# Patient Record
Sex: Female | Born: 1945 | Race: Black or African American | Hispanic: No | Smoking: Former smoker
Health system: Southern US, Community
[De-identification: ages and names within clinical notes are randomized; demographics above are authoritative.]

## PROBLEM LIST (undated history)

## (undated) DIAGNOSIS — C719 Malignant neoplasm of brain, unspecified: Secondary | ICD-10-CM

## (undated) DIAGNOSIS — M545 Low back pain, unspecified: Secondary | ICD-10-CM

## (undated) DIAGNOSIS — G039 Meningitis, unspecified: Secondary | ICD-10-CM

## (undated) DIAGNOSIS — C7951 Secondary malignant neoplasm of bone: Secondary | ICD-10-CM

## (undated) DIAGNOSIS — G8929 Other chronic pain: Secondary | ICD-10-CM

## (undated) DIAGNOSIS — Z923 Personal history of irradiation: Secondary | ICD-10-CM

## (undated) DIAGNOSIS — Z8669 Personal history of other diseases of the nervous system and sense organs: Secondary | ICD-10-CM

## (undated) DIAGNOSIS — Z720 Tobacco use: Secondary | ICD-10-CM

## (undated) DIAGNOSIS — M7989 Other specified soft tissue disorders: Secondary | ICD-10-CM

## (undated) DIAGNOSIS — IMO0002 Reserved for concepts with insufficient information to code with codable children: Secondary | ICD-10-CM

## (undated) DIAGNOSIS — E785 Hyperlipidemia, unspecified: Secondary | ICD-10-CM

## (undated) DIAGNOSIS — C801 Malignant (primary) neoplasm, unspecified: Secondary | ICD-10-CM

## (undated) DIAGNOSIS — R0602 Shortness of breath: Secondary | ICD-10-CM

## (undated) DIAGNOSIS — M792 Neuralgia and neuritis, unspecified: Secondary | ICD-10-CM

## (undated) DIAGNOSIS — I10 Essential (primary) hypertension: Secondary | ICD-10-CM

## (undated) DIAGNOSIS — M659 Unspecified synovitis and tenosynovitis, unspecified site: Secondary | ICD-10-CM

## (undated) DIAGNOSIS — G43909 Migraine, unspecified, not intractable, without status migrainosus: Secondary | ICD-10-CM

## (undated) DIAGNOSIS — C50919 Malignant neoplasm of unspecified site of unspecified female breast: Secondary | ICD-10-CM

## (undated) HISTORY — DX: Reserved for concepts with insufficient information to code with codable children: IMO0002

## (undated) HISTORY — DX: Neuralgia and neuritis, unspecified: M79.2

## (undated) HISTORY — DX: Malignant neoplasm of brain, unspecified: C71.9

## (undated) HISTORY — DX: Unspecified synovitis and tenosynovitis, unspecified site: M65.90

## (undated) HISTORY — PX: LAMINECTOMY: SHX219

## (undated) HISTORY — DX: Personal history of other diseases of the nervous system and sense organs: Z86.69

## (undated) HISTORY — PX: ROTATOR CUFF REPAIR: SHX139

## (undated) HISTORY — DX: Synovitis and tenosynovitis, unspecified: M65.9

## (undated) HISTORY — DX: Tobacco use: Z72.0

## (undated) HISTORY — DX: Low back pain, unspecified: M54.50

## (undated) HISTORY — DX: Other chronic pain: G89.29

## (undated) HISTORY — PX: ABDOMINAL HYSTERECTOMY: SHX81

## (undated) HISTORY — PX: JOINT REPLACEMENT: SHX530

## (undated) HISTORY — PX: BACK SURGERY: SHX140

## (undated) HISTORY — DX: Hyperlipidemia, unspecified: E78.5

## (undated) HISTORY — PX: BREAST SURGERY: SHX581

## (undated) HISTORY — DX: Meningitis, unspecified: G03.9

## (undated) HISTORY — DX: Migraine, unspecified, not intractable, without status migrainosus: G43.909

## (undated) HISTORY — DX: Other specified soft tissue disorders: M79.89

## (undated) HISTORY — DX: Malignant neoplasm of unspecified site of unspecified female breast: C50.919

## (undated) HISTORY — DX: Low back pain: M54.5

## (undated) HISTORY — DX: Essential (primary) hypertension: I10

## (undated) HISTORY — PX: MASTECTOMY: SHX3

## (undated) SURGERY — AMPUTATION DIGIT
Anesthesia: General | Laterality: Bilateral

---

## 1994-06-30 DIAGNOSIS — C801 Malignant (primary) neoplasm, unspecified: Secondary | ICD-10-CM

## 1994-06-30 HISTORY — DX: Malignant (primary) neoplasm, unspecified: C80.1

## 1998-05-14 ENCOUNTER — Encounter: Payer: Self-pay | Admitting: Orthopedic Surgery

## 1998-05-15 ENCOUNTER — Ambulatory Visit (HOSPITAL_COMMUNITY): Admission: RE | Admit: 1998-05-15 | Discharge: 1998-05-15 | Payer: Self-pay | Admitting: Orthopedic Surgery

## 1999-11-25 ENCOUNTER — Encounter: Payer: Self-pay | Admitting: Orthopedic Surgery

## 1999-11-26 ENCOUNTER — Ambulatory Visit (HOSPITAL_COMMUNITY): Admission: RE | Admit: 1999-11-26 | Discharge: 1999-11-26 | Payer: Self-pay | Admitting: Orthopedic Surgery

## 2000-03-10 ENCOUNTER — Emergency Department (HOSPITAL_COMMUNITY): Admission: EM | Admit: 2000-03-10 | Discharge: 2000-03-10 | Payer: Self-pay | Admitting: Emergency Medicine

## 2000-04-28 ENCOUNTER — Ambulatory Visit (HOSPITAL_COMMUNITY): Admission: RE | Admit: 2000-04-28 | Discharge: 2000-04-28 | Payer: Self-pay | Admitting: Orthopedic Surgery

## 2000-06-02 ENCOUNTER — Ambulatory Visit (HOSPITAL_COMMUNITY): Admission: RE | Admit: 2000-06-02 | Discharge: 2000-06-02 | Payer: Self-pay | Admitting: General Surgery

## 2000-06-02 ENCOUNTER — Encounter (HOSPITAL_BASED_OUTPATIENT_CLINIC_OR_DEPARTMENT_OTHER): Payer: Self-pay | Admitting: General Surgery

## 2000-06-08 ENCOUNTER — Ambulatory Visit (HOSPITAL_COMMUNITY): Admission: RE | Admit: 2000-06-08 | Discharge: 2000-06-08 | Payer: Self-pay | Admitting: Hematology and Oncology

## 2000-06-08 ENCOUNTER — Encounter (HOSPITAL_BASED_OUTPATIENT_CLINIC_OR_DEPARTMENT_OTHER): Payer: Self-pay | Admitting: General Surgery

## 2000-06-08 ENCOUNTER — Encounter: Payer: Self-pay | Admitting: Hematology and Oncology

## 2000-10-01 ENCOUNTER — Ambulatory Visit (HOSPITAL_COMMUNITY): Admission: RE | Admit: 2000-10-01 | Discharge: 2000-10-01 | Payer: Self-pay | Admitting: General Surgery

## 2001-10-04 ENCOUNTER — Emergency Department (HOSPITAL_COMMUNITY): Admission: EM | Admit: 2001-10-04 | Discharge: 2001-10-05 | Payer: Self-pay | Admitting: Emergency Medicine

## 2001-10-18 ENCOUNTER — Emergency Department (HOSPITAL_COMMUNITY): Admission: EM | Admit: 2001-10-18 | Discharge: 2001-10-18 | Payer: Self-pay | Admitting: Emergency Medicine

## 2001-10-19 ENCOUNTER — Encounter: Payer: Self-pay | Admitting: Emergency Medicine

## 2001-10-20 ENCOUNTER — Inpatient Hospital Stay (HOSPITAL_COMMUNITY): Admission: EM | Admit: 2001-10-20 | Discharge: 2001-10-24 | Payer: Self-pay | Admitting: *Deleted

## 2001-10-20 ENCOUNTER — Encounter: Payer: Self-pay | Admitting: Internal Medicine

## 2001-10-21 ENCOUNTER — Encounter: Payer: Self-pay | Admitting: Internal Medicine

## 2001-10-25 ENCOUNTER — Encounter: Admission: RE | Admit: 2001-10-25 | Discharge: 2001-10-25 | Payer: Self-pay

## 2001-11-18 ENCOUNTER — Encounter: Admission: RE | Admit: 2001-11-18 | Discharge: 2001-11-18 | Payer: Self-pay | Admitting: Internal Medicine

## 2002-02-03 ENCOUNTER — Encounter: Admission: RE | Admit: 2002-02-03 | Discharge: 2002-02-03 | Payer: Self-pay | Admitting: Internal Medicine

## 2002-02-15 ENCOUNTER — Encounter: Payer: Self-pay | Admitting: Emergency Medicine

## 2002-02-15 ENCOUNTER — Emergency Department (HOSPITAL_COMMUNITY): Admission: EM | Admit: 2002-02-15 | Discharge: 2002-02-15 | Payer: Self-pay | Admitting: Emergency Medicine

## 2002-03-07 ENCOUNTER — Encounter: Admission: RE | Admit: 2002-03-07 | Discharge: 2002-03-07 | Payer: Self-pay | Admitting: Internal Medicine

## 2002-03-20 ENCOUNTER — Emergency Department (HOSPITAL_COMMUNITY): Admission: EM | Admit: 2002-03-20 | Discharge: 2002-03-20 | Payer: Self-pay | Admitting: Emergency Medicine

## 2002-05-12 ENCOUNTER — Emergency Department (HOSPITAL_COMMUNITY): Admission: EM | Admit: 2002-05-12 | Discharge: 2002-05-12 | Payer: Self-pay

## 2002-06-21 ENCOUNTER — Inpatient Hospital Stay (HOSPITAL_COMMUNITY): Admission: EM | Admit: 2002-06-21 | Discharge: 2002-06-25 | Payer: Self-pay | Admitting: Emergency Medicine

## 2002-06-27 ENCOUNTER — Ambulatory Visit: Admission: RE | Admit: 2002-06-27 | Discharge: 2002-07-19 | Payer: Self-pay | Admitting: Radiation Oncology

## 2002-11-08 ENCOUNTER — Ambulatory Visit (HOSPITAL_COMMUNITY): Admission: RE | Admit: 2002-11-08 | Discharge: 2002-11-08 | Payer: Self-pay | Admitting: Internal Medicine

## 2002-11-08 ENCOUNTER — Encounter: Admission: RE | Admit: 2002-11-08 | Discharge: 2002-11-08 | Payer: Self-pay | Admitting: Internal Medicine

## 2002-11-11 ENCOUNTER — Ambulatory Visit (HOSPITAL_COMMUNITY): Admission: RE | Admit: 2002-11-11 | Discharge: 2002-11-11 | Payer: Self-pay | Admitting: Internal Medicine

## 2002-11-11 ENCOUNTER — Encounter: Payer: Self-pay | Admitting: Internal Medicine

## 2002-11-15 ENCOUNTER — Encounter: Admission: RE | Admit: 2002-11-15 | Discharge: 2002-11-15 | Payer: Self-pay | Admitting: Internal Medicine

## 2002-12-14 ENCOUNTER — Encounter: Payer: Self-pay | Admitting: Oncology

## 2002-12-14 ENCOUNTER — Ambulatory Visit (HOSPITAL_COMMUNITY): Admission: RE | Admit: 2002-12-14 | Discharge: 2002-12-14 | Payer: Self-pay | Admitting: Oncology

## 2003-01-12 ENCOUNTER — Encounter: Admission: RE | Admit: 2003-01-12 | Discharge: 2003-01-12 | Payer: Self-pay | Admitting: Internal Medicine

## 2003-02-21 ENCOUNTER — Encounter: Admission: RE | Admit: 2003-02-21 | Discharge: 2003-02-21 | Payer: Self-pay | Admitting: Internal Medicine

## 2003-03-01 ENCOUNTER — Encounter: Admission: RE | Admit: 2003-03-01 | Discharge: 2003-03-01 | Payer: Self-pay | Admitting: Internal Medicine

## 2003-04-04 ENCOUNTER — Encounter: Payer: Self-pay | Admitting: Emergency Medicine

## 2003-04-04 ENCOUNTER — Emergency Department (HOSPITAL_COMMUNITY): Admission: EM | Admit: 2003-04-04 | Discharge: 2003-04-04 | Payer: Self-pay | Admitting: Emergency Medicine

## 2003-05-02 ENCOUNTER — Emergency Department (HOSPITAL_COMMUNITY): Admission: EM | Admit: 2003-05-02 | Discharge: 2003-05-02 | Payer: Self-pay

## 2003-05-16 ENCOUNTER — Ambulatory Visit (HOSPITAL_COMMUNITY): Admission: RE | Admit: 2003-05-16 | Discharge: 2003-05-16 | Payer: Self-pay | Admitting: Internal Medicine

## 2003-05-16 ENCOUNTER — Encounter: Admission: RE | Admit: 2003-05-16 | Discharge: 2003-05-16 | Payer: Self-pay | Admitting: Internal Medicine

## 2003-06-05 ENCOUNTER — Encounter: Admission: RE | Admit: 2003-06-05 | Discharge: 2003-06-05 | Payer: Self-pay | Admitting: Internal Medicine

## 2003-08-25 ENCOUNTER — Ambulatory Visit (HOSPITAL_COMMUNITY): Admission: RE | Admit: 2003-08-25 | Discharge: 2003-08-25 | Payer: Self-pay | Admitting: Oncology

## 2003-10-16 ENCOUNTER — Ambulatory Visit (HOSPITAL_COMMUNITY): Admission: RE | Admit: 2003-10-16 | Discharge: 2003-10-16 | Payer: Self-pay | Admitting: Oncology

## 2004-03-08 ENCOUNTER — Emergency Department (HOSPITAL_COMMUNITY): Admission: EM | Admit: 2004-03-08 | Discharge: 2004-03-08 | Payer: Self-pay | Admitting: Emergency Medicine

## 2004-03-19 ENCOUNTER — Ambulatory Visit: Payer: Self-pay | Admitting: Internal Medicine

## 2004-04-02 ENCOUNTER — Encounter: Admission: RE | Admit: 2004-04-02 | Discharge: 2004-04-02 | Payer: Self-pay | Admitting: Internal Medicine

## 2004-05-02 ENCOUNTER — Ambulatory Visit: Payer: Self-pay | Admitting: Oncology

## 2004-07-25 ENCOUNTER — Ambulatory Visit: Payer: Self-pay | Admitting: Oncology

## 2004-09-18 ENCOUNTER — Ambulatory Visit: Payer: Self-pay | Admitting: Oncology

## 2004-09-27 ENCOUNTER — Ambulatory Visit (HOSPITAL_COMMUNITY): Admission: RE | Admit: 2004-09-27 | Discharge: 2004-09-27 | Payer: Self-pay | Admitting: Orthopedic Surgery

## 2004-11-20 ENCOUNTER — Ambulatory Visit: Payer: Self-pay | Admitting: Oncology

## 2005-01-14 ENCOUNTER — Ambulatory Visit (HOSPITAL_COMMUNITY): Admission: RE | Admit: 2005-01-14 | Discharge: 2005-01-14 | Payer: Self-pay | Admitting: Oncology

## 2005-01-15 ENCOUNTER — Ambulatory Visit: Payer: Self-pay | Admitting: Oncology

## 2005-02-11 ENCOUNTER — Ambulatory Visit: Payer: Self-pay | Admitting: Internal Medicine

## 2005-02-11 ENCOUNTER — Inpatient Hospital Stay (HOSPITAL_COMMUNITY): Admission: EM | Admit: 2005-02-11 | Discharge: 2005-02-14 | Payer: Self-pay | Admitting: Emergency Medicine

## 2005-02-12 ENCOUNTER — Ambulatory Visit: Payer: Self-pay | Admitting: Oncology

## 2005-02-25 ENCOUNTER — Ambulatory Visit: Payer: Self-pay | Admitting: Internal Medicine

## 2005-02-26 ENCOUNTER — Ambulatory Visit (HOSPITAL_COMMUNITY): Admission: RE | Admit: 2005-02-26 | Discharge: 2005-02-27 | Payer: Self-pay | Admitting: Orthopedic Surgery

## 2005-03-13 ENCOUNTER — Ambulatory Visit: Payer: Self-pay | Admitting: Oncology

## 2005-04-04 ENCOUNTER — Encounter: Admission: RE | Admit: 2005-04-04 | Discharge: 2005-04-04 | Payer: Self-pay | Admitting: Oncology

## 2005-04-15 ENCOUNTER — Encounter: Admission: RE | Admit: 2005-04-15 | Discharge: 2005-07-14 | Payer: Self-pay | Admitting: Orthopedic Surgery

## 2005-05-05 ENCOUNTER — Ambulatory Visit: Payer: Self-pay | Admitting: Oncology

## 2005-06-18 ENCOUNTER — Ambulatory Visit: Payer: Self-pay | Admitting: Internal Medicine

## 2005-06-20 ENCOUNTER — Ambulatory Visit: Payer: Self-pay | Admitting: Oncology

## 2005-06-27 ENCOUNTER — Emergency Department (HOSPITAL_COMMUNITY): Admission: EM | Admit: 2005-06-27 | Discharge: 2005-06-27 | Payer: Self-pay | Admitting: Family Medicine

## 2005-08-18 ENCOUNTER — Ambulatory Visit: Payer: Self-pay | Admitting: Oncology

## 2005-08-22 ENCOUNTER — Ambulatory Visit: Admission: RE | Admit: 2005-08-22 | Discharge: 2005-08-22 | Payer: Self-pay | Admitting: Oncology

## 2005-08-26 ENCOUNTER — Ambulatory Visit (HOSPITAL_COMMUNITY): Admission: RE | Admit: 2005-08-26 | Discharge: 2005-08-26 | Payer: Self-pay | Admitting: Oncology

## 2005-08-27 ENCOUNTER — Ambulatory Visit (HOSPITAL_COMMUNITY): Admission: RE | Admit: 2005-08-27 | Discharge: 2005-08-27 | Payer: Self-pay | Admitting: Orthopedic Surgery

## 2005-09-22 ENCOUNTER — Encounter: Admission: RE | Admit: 2005-09-22 | Discharge: 2005-09-22 | Payer: Self-pay | Admitting: Oncology

## 2005-10-03 ENCOUNTER — Ambulatory Visit: Payer: Self-pay | Admitting: Oncology

## 2005-10-20 ENCOUNTER — Ambulatory Visit: Payer: Self-pay | Admitting: Hospitalist

## 2005-10-24 ENCOUNTER — Ambulatory Visit: Payer: Self-pay | Admitting: Internal Medicine

## 2005-12-03 ENCOUNTER — Encounter: Admission: RE | Admit: 2005-12-03 | Discharge: 2006-01-28 | Payer: Self-pay | Admitting: Neurosurgery

## 2005-12-26 ENCOUNTER — Ambulatory Visit: Payer: Self-pay | Admitting: Oncology

## 2006-01-22 ENCOUNTER — Ambulatory Visit (HOSPITAL_COMMUNITY): Admission: RE | Admit: 2006-01-22 | Discharge: 2006-01-22 | Payer: Self-pay | Admitting: Neurosurgery

## 2006-02-02 LAB — CBC WITH DIFFERENTIAL/PLATELET
BASO%: 0.3 % (ref 0.0–2.0)
EOS%: 1.1 % (ref 0.0–7.0)
LYMPH%: 29.2 % (ref 14.0–48.0)
MCH: 31.1 pg (ref 26.0–34.0)
MCHC: 33.4 g/dL (ref 32.0–36.0)
MONO#: 0.5 10*3/uL (ref 0.1–0.9)
NEUT%: 58.2 % (ref 39.6–76.8)
RBC: 3.87 10*6/uL (ref 3.70–5.32)
WBC: 4.5 10*3/uL (ref 3.9–10.0)
lymph#: 1.3 10*3/uL (ref 0.9–3.3)

## 2006-02-02 LAB — COMPREHENSIVE METABOLIC PANEL
ALT: 8 U/L (ref 0–40)
AST: 11 U/L (ref 0–37)
CO2: 27 mEq/L (ref 19–32)
Chloride: 109 mEq/L (ref 96–112)
Creatinine, Ser: 0.84 mg/dL (ref 0.40–1.20)
Sodium: 144 mEq/L (ref 135–145)
Total Bilirubin: 0.5 mg/dL (ref 0.3–1.2)
Total Protein: 6.3 g/dL (ref 6.0–8.3)

## 2006-02-02 LAB — CANCER ANTIGEN 27.29: CA 27.29: 21 U/mL (ref 0–39)

## 2006-02-03 ENCOUNTER — Ambulatory Visit (HOSPITAL_COMMUNITY): Admission: RE | Admit: 2006-02-03 | Discharge: 2006-02-03 | Payer: Self-pay | Admitting: Neurosurgery

## 2006-02-17 ENCOUNTER — Emergency Department (HOSPITAL_COMMUNITY): Admission: EM | Admit: 2006-02-17 | Discharge: 2006-02-17 | Payer: Self-pay | Admitting: Family Medicine

## 2006-02-19 ENCOUNTER — Ambulatory Visit: Payer: Self-pay | Admitting: Hospitalist

## 2006-02-20 ENCOUNTER — Encounter (INDEPENDENT_AMBULATORY_CARE_PROVIDER_SITE_OTHER): Payer: Self-pay | Admitting: Internal Medicine

## 2006-02-21 ENCOUNTER — Ambulatory Visit (HOSPITAL_COMMUNITY): Admission: RE | Admit: 2006-02-21 | Discharge: 2006-02-21 | Payer: Self-pay | Admitting: Hospitalist

## 2006-02-26 ENCOUNTER — Ambulatory Visit: Payer: Self-pay | Admitting: Internal Medicine

## 2006-03-05 ENCOUNTER — Ambulatory Visit: Payer: Self-pay | Admitting: Oncology

## 2006-03-27 ENCOUNTER — Encounter: Payer: Self-pay | Admitting: Vascular Surgery

## 2006-03-27 ENCOUNTER — Ambulatory Visit: Admission: RE | Admit: 2006-03-27 | Discharge: 2006-03-27 | Payer: Self-pay | Admitting: Oncology

## 2006-04-02 ENCOUNTER — Encounter: Admission: RE | Admit: 2006-04-02 | Discharge: 2006-04-02 | Payer: Self-pay | Admitting: Orthopedic Surgery

## 2006-04-06 ENCOUNTER — Encounter: Admission: RE | Admit: 2006-04-06 | Discharge: 2006-04-06 | Payer: Self-pay | Admitting: Oncology

## 2006-04-29 ENCOUNTER — Ambulatory Visit: Admission: RE | Admit: 2006-04-29 | Discharge: 2006-04-29 | Payer: Self-pay | Admitting: Orthopedic Surgery

## 2006-04-29 ENCOUNTER — Ambulatory Visit: Payer: Self-pay | Admitting: Internal Medicine

## 2006-04-30 ENCOUNTER — Encounter (INDEPENDENT_AMBULATORY_CARE_PROVIDER_SITE_OTHER): Payer: Self-pay | Admitting: Internal Medicine

## 2006-04-30 DIAGNOSIS — R609 Edema, unspecified: Secondary | ICD-10-CM

## 2006-04-30 DIAGNOSIS — IMO0002 Reserved for concepts with insufficient information to code with codable children: Secondary | ICD-10-CM | POA: Insufficient documentation

## 2006-04-30 DIAGNOSIS — Z9010 Acquired absence of unspecified breast and nipple: Secondary | ICD-10-CM | POA: Insufficient documentation

## 2006-04-30 DIAGNOSIS — J45909 Unspecified asthma, uncomplicated: Secondary | ICD-10-CM | POA: Insufficient documentation

## 2006-04-30 DIAGNOSIS — G039 Meningitis, unspecified: Secondary | ICD-10-CM | POA: Insufficient documentation

## 2006-04-30 DIAGNOSIS — M659 Unspecified synovitis and tenosynovitis, unspecified site: Secondary | ICD-10-CM | POA: Insufficient documentation

## 2006-05-04 ENCOUNTER — Ambulatory Visit: Payer: Self-pay | Admitting: Hospitalist

## 2006-05-07 ENCOUNTER — Ambulatory Visit: Payer: Self-pay | Admitting: Internal Medicine

## 2006-05-19 ENCOUNTER — Ambulatory Visit: Payer: Self-pay | Admitting: Oncology

## 2006-06-05 LAB — CBC WITH DIFFERENTIAL/PLATELET
BASO%: 0.6 % (ref 0.0–2.0)
Basophils Absolute: 0 10*3/uL (ref 0.0–0.1)
EOS%: 1.1 % (ref 0.0–7.0)
HCT: 36.3 % (ref 34.8–46.6)
HGB: 12.3 g/dL (ref 11.6–15.9)
MCH: 31.1 pg (ref 26.0–34.0)
MONO#: 0.5 10*3/uL (ref 0.1–0.9)
NEUT#: 3 10*3/uL (ref 1.5–6.5)
NEUT%: 58.5 % (ref 39.6–76.8)
RDW: 12.3 % (ref 11.3–14.5)
WBC: 5.1 10*3/uL (ref 3.9–10.0)
lymph#: 1.5 10*3/uL (ref 0.9–3.3)

## 2006-06-05 LAB — COMPREHENSIVE METABOLIC PANEL
ALT: 8 U/L (ref 0–35)
AST: 11 U/L (ref 0–37)
Albumin: 4 g/dL (ref 3.5–5.2)
BUN: 15 mg/dL (ref 6–23)
CO2: 25 mEq/L (ref 19–32)
Calcium: 9.2 mg/dL (ref 8.4–10.5)
Chloride: 110 mEq/L (ref 96–112)
Creatinine, Ser: 0.97 mg/dL (ref 0.40–1.20)
Potassium: 3.9 mEq/L (ref 3.5–5.3)

## 2006-06-05 LAB — CANCER ANTIGEN 27.29: CA 27.29: 23 U/mL (ref 0–39)

## 2006-06-11 ENCOUNTER — Inpatient Hospital Stay (HOSPITAL_COMMUNITY): Admission: RE | Admit: 2006-06-11 | Discharge: 2006-06-15 | Payer: Self-pay | Admitting: Orthopedic Surgery

## 2006-07-06 DIAGNOSIS — G43909 Migraine, unspecified, not intractable, without status migrainosus: Secondary | ICD-10-CM | POA: Insufficient documentation

## 2006-07-28 ENCOUNTER — Ambulatory Visit: Payer: Self-pay | Admitting: Oncology

## 2006-08-11 DIAGNOSIS — M545 Low back pain: Secondary | ICD-10-CM

## 2006-09-09 ENCOUNTER — Ambulatory Visit: Payer: Self-pay | Admitting: Oncology

## 2006-09-10 ENCOUNTER — Encounter: Admission: RE | Admit: 2006-09-10 | Discharge: 2006-09-10 | Payer: Self-pay | Admitting: Orthopedic Surgery

## 2006-09-14 ENCOUNTER — Encounter (INDEPENDENT_AMBULATORY_CARE_PROVIDER_SITE_OTHER): Payer: Self-pay | Admitting: Internal Medicine

## 2006-12-10 ENCOUNTER — Ambulatory Visit: Payer: Self-pay | Admitting: Oncology

## 2007-01-20 ENCOUNTER — Encounter: Admission: RE | Admit: 2007-01-20 | Discharge: 2007-01-20 | Payer: Self-pay | Admitting: Orthopedic Surgery

## 2007-01-25 ENCOUNTER — Ambulatory Visit: Payer: Self-pay | Admitting: Oncology

## 2007-01-29 ENCOUNTER — Ambulatory Visit: Payer: Self-pay | Admitting: Vascular Surgery

## 2007-01-29 ENCOUNTER — Ambulatory Visit: Admission: RE | Admit: 2007-01-29 | Discharge: 2007-01-29 | Payer: Self-pay | Admitting: Oncology

## 2007-02-10 ENCOUNTER — Telehealth: Payer: Self-pay | Admitting: *Deleted

## 2007-04-13 ENCOUNTER — Encounter: Admission: RE | Admit: 2007-04-13 | Discharge: 2007-04-13 | Payer: Self-pay | Admitting: Oncology

## 2007-04-14 ENCOUNTER — Ambulatory Visit: Payer: Self-pay | Admitting: Oncology

## 2007-04-14 ENCOUNTER — Telehealth: Payer: Self-pay | Admitting: *Deleted

## 2007-04-28 ENCOUNTER — Encounter (INDEPENDENT_AMBULATORY_CARE_PROVIDER_SITE_OTHER): Payer: Self-pay | Admitting: Internal Medicine

## 2007-05-11 ENCOUNTER — Encounter (INDEPENDENT_AMBULATORY_CARE_PROVIDER_SITE_OTHER): Payer: Self-pay | Admitting: Internal Medicine

## 2007-05-18 ENCOUNTER — Encounter (INDEPENDENT_AMBULATORY_CARE_PROVIDER_SITE_OTHER): Payer: Self-pay | Admitting: Internal Medicine

## 2007-05-18 LAB — COMPREHENSIVE METABOLIC PANEL
ALT: 10 U/L (ref 0–35)
AST: 18 U/L (ref 0–37)
Alkaline Phosphatase: 92 U/L (ref 39–117)
BUN: 13 mg/dL (ref 6–23)
Calcium: 8.9 mg/dL (ref 8.4–10.5)
Creatinine, Ser: 0.86 mg/dL (ref 0.40–1.20)
Total Bilirubin: 0.6 mg/dL (ref 0.3–1.2)

## 2007-05-18 LAB — CBC WITH DIFFERENTIAL/PLATELET
BASO%: 0.2 % (ref 0.0–2.0)
Basophils Absolute: 0 10*3/uL (ref 0.0–0.1)
EOS%: 0.6 % (ref 0.0–7.0)
HCT: 37.5 % (ref 34.8–46.6)
HGB: 12.9 g/dL (ref 11.6–15.9)
LYMPH%: 22.5 % (ref 14.0–48.0)
MCH: 31.2 pg (ref 26.0–34.0)
MCHC: 34.4 g/dL (ref 32.0–36.0)
MCV: 90.8 fL (ref 81.0–101.0)
MONO%: 8.7 % (ref 0.0–13.0)
NEUT%: 68 % (ref 39.6–76.8)

## 2007-05-20 ENCOUNTER — Ambulatory Visit: Admission: RE | Admit: 2007-05-20 | Discharge: 2007-05-20 | Payer: Self-pay | Admitting: Oncology

## 2007-05-20 ENCOUNTER — Ambulatory Visit: Payer: Self-pay | Admitting: Vascular Surgery

## 2007-05-26 ENCOUNTER — Encounter (INDEPENDENT_AMBULATORY_CARE_PROVIDER_SITE_OTHER): Payer: Self-pay | Admitting: Internal Medicine

## 2007-05-26 ENCOUNTER — Ambulatory Visit: Payer: Self-pay | Admitting: Hospitalist

## 2007-05-26 DIAGNOSIS — Z780 Asymptomatic menopausal state: Secondary | ICD-10-CM | POA: Insufficient documentation

## 2007-05-26 DIAGNOSIS — J069 Acute upper respiratory infection, unspecified: Secondary | ICD-10-CM | POA: Insufficient documentation

## 2007-05-26 LAB — CONVERTED CEMR LAB
ALT: 12 units/L (ref 0–35)
AST: 15 units/L (ref 0–37)
BUN: 17 mg/dL (ref 6–23)
Creatinine, Ser: 0.89 mg/dL (ref 0.40–1.20)
HCT: 39.5 % (ref 36.0–46.0)
Hemoglobin: 13 g/dL (ref 12.0–15.0)
MCHC: 32.9 g/dL (ref 30.0–36.0)
MCV: 92.5 fL (ref 78.0–100.0)
Platelets: 239 10*3/uL (ref 150–400)
RDW: 13.9 % (ref 11.5–15.5)
Total Bilirubin: 0.3 mg/dL (ref 0.3–1.2)

## 2007-06-21 ENCOUNTER — Encounter: Admission: RE | Admit: 2007-06-21 | Discharge: 2007-06-21 | Payer: Self-pay | Admitting: Orthopedic Surgery

## 2007-07-21 ENCOUNTER — Ambulatory Visit: Payer: Self-pay | Admitting: Internal Medicine

## 2007-07-21 DIAGNOSIS — I10 Essential (primary) hypertension: Secondary | ICD-10-CM | POA: Insufficient documentation

## 2007-08-05 ENCOUNTER — Ambulatory Visit: Payer: Self-pay | Admitting: Oncology

## 2007-08-09 ENCOUNTER — Encounter (INDEPENDENT_AMBULATORY_CARE_PROVIDER_SITE_OTHER): Payer: Self-pay | Admitting: Internal Medicine

## 2007-08-11 ENCOUNTER — Ambulatory Visit: Payer: Self-pay | Admitting: Internal Medicine

## 2007-08-11 ENCOUNTER — Encounter (INDEPENDENT_AMBULATORY_CARE_PROVIDER_SITE_OTHER): Payer: Self-pay | Admitting: Internal Medicine

## 2007-08-11 LAB — CONVERTED CEMR LAB
BUN: 14 mg/dL (ref 6–23)
CO2: 24 meq/L (ref 19–32)
Chloride: 108 meq/L (ref 96–112)
Creatinine, Ser: 0.83 mg/dL (ref 0.40–1.20)
Potassium: 4 meq/L (ref 3.5–5.3)

## 2007-08-12 ENCOUNTER — Ambulatory Visit (HOSPITAL_COMMUNITY): Admission: RE | Admit: 2007-08-12 | Discharge: 2007-08-12 | Payer: Self-pay | Admitting: Internal Medicine

## 2007-08-20 ENCOUNTER — Encounter (INDEPENDENT_AMBULATORY_CARE_PROVIDER_SITE_OTHER): Payer: Self-pay | Admitting: *Deleted

## 2007-08-20 ENCOUNTER — Ambulatory Visit: Payer: Self-pay | Admitting: Internal Medicine

## 2007-10-20 ENCOUNTER — Encounter: Admission: RE | Admit: 2007-10-20 | Discharge: 2007-10-20 | Payer: Self-pay | Admitting: Orthopedic Surgery

## 2008-01-06 ENCOUNTER — Encounter (INDEPENDENT_AMBULATORY_CARE_PROVIDER_SITE_OTHER): Payer: Self-pay | Admitting: *Deleted

## 2008-01-06 ENCOUNTER — Ambulatory Visit: Payer: Self-pay | Admitting: Infectious Diseases

## 2008-01-06 ENCOUNTER — Ambulatory Visit (HOSPITAL_COMMUNITY): Admission: RE | Admit: 2008-01-06 | Discharge: 2008-01-06 | Payer: Self-pay | Admitting: Infectious Diseases

## 2008-01-06 DIAGNOSIS — M79609 Pain in unspecified limb: Secondary | ICD-10-CM

## 2008-01-07 ENCOUNTER — Encounter (INDEPENDENT_AMBULATORY_CARE_PROVIDER_SITE_OTHER): Payer: Self-pay | Admitting: *Deleted

## 2008-01-07 ENCOUNTER — Ambulatory Visit: Payer: Self-pay | Admitting: Surgery

## 2008-01-07 ENCOUNTER — Ambulatory Visit (HOSPITAL_COMMUNITY): Admission: RE | Admit: 2008-01-07 | Discharge: 2008-01-07 | Payer: Self-pay | Admitting: *Deleted

## 2008-01-07 DIAGNOSIS — E079 Disorder of thyroid, unspecified: Secondary | ICD-10-CM | POA: Insufficient documentation

## 2008-01-07 LAB — CONVERTED CEMR LAB
Basophils Absolute: 0 10*3/uL (ref 0.0–0.1)
Basophils Relative: 0 % (ref 0–1)
Eosinophils Absolute: 0.1 10*3/uL (ref 0.0–0.7)
Hemoglobin: 12.4 g/dL (ref 12.0–15.0)
MCHC: 33.3 g/dL (ref 30.0–36.0)
MCV: 92.1 fL (ref 78.0–100.0)
Monocytes Absolute: 0.6 10*3/uL (ref 0.1–1.0)
Monocytes Relative: 10 % (ref 3–12)
Neutro Abs: 2.9 10*3/uL (ref 1.7–7.7)
Neutrophils Relative %: 55 % (ref 43–77)
RBC: 4.04 M/uL (ref 3.87–5.11)
RDW: 14.7 % (ref 11.5–15.5)
Vitamin B-12: 299 pg/mL (ref 211–911)

## 2008-01-20 ENCOUNTER — Ambulatory Visit: Payer: Self-pay | Admitting: Internal Medicine

## 2008-01-20 ENCOUNTER — Encounter (INDEPENDENT_AMBULATORY_CARE_PROVIDER_SITE_OTHER): Payer: Self-pay | Admitting: Internal Medicine

## 2008-01-20 LAB — CONVERTED CEMR LAB
ALT: 12 units/L (ref 0–35)
AST: 16 units/L (ref 0–37)
Albumin: 4.3 g/dL (ref 3.5–5.2)
CO2: 24 meq/L (ref 19–32)
Calcium: 9.2 mg/dL (ref 8.4–10.5)
Chloride: 105 meq/L (ref 96–112)
Cholesterol: 161 mg/dL (ref 0–200)
Creatinine, Urine: 125.7 mg/dL
Free T4: 1.09 ng/dL (ref 0.89–1.80)
Microalb Creat Ratio: 10.6 mg/g (ref 0.0–30.0)
Potassium: 4.2 meq/L (ref 3.5–5.3)
Sodium: 140 meq/L (ref 135–145)
T4, Total: 7.2 ug/dL (ref 5.0–12.5)
Total CHOL/HDL Ratio: 2.6
Total Protein: 7.1 g/dL (ref 6.0–8.3)

## 2008-01-25 ENCOUNTER — Ambulatory Visit (HOSPITAL_COMMUNITY): Admission: RE | Admit: 2008-01-25 | Discharge: 2008-01-25 | Payer: Self-pay | Admitting: Internal Medicine

## 2008-01-25 ENCOUNTER — Encounter: Payer: Self-pay | Admitting: Internal Medicine

## 2008-01-27 ENCOUNTER — Telehealth: Payer: Self-pay | Admitting: *Deleted

## 2008-03-21 ENCOUNTER — Encounter (INDEPENDENT_AMBULATORY_CARE_PROVIDER_SITE_OTHER): Payer: Self-pay | Admitting: Internal Medicine

## 2008-05-10 ENCOUNTER — Encounter: Admission: RE | Admit: 2008-05-10 | Discharge: 2008-05-10 | Payer: Self-pay | Admitting: Orthopedic Surgery

## 2008-06-14 ENCOUNTER — Ambulatory Visit (HOSPITAL_COMMUNITY): Admission: RE | Admit: 2008-06-14 | Discharge: 2008-06-14 | Payer: Self-pay | Admitting: Orthopedic Surgery

## 2008-09-13 ENCOUNTER — Ambulatory Visit: Payer: Self-pay | Admitting: Oncology

## 2008-09-13 LAB — CBC WITH DIFFERENTIAL/PLATELET
Basophils Absolute: 0 10*3/uL (ref 0.0–0.1)
Eosinophils Absolute: 0 10*3/uL (ref 0.0–0.5)
HCT: 37.4 % (ref 34.8–46.6)
LYMPH%: 35 % (ref 14.0–49.7)
MCV: 90.6 fL (ref 79.5–101.0)
MONO#: 0.5 10*3/uL (ref 0.1–0.9)
MONO%: 9.6 % (ref 0.0–14.0)
NEUT#: 2.9 10*3/uL (ref 1.5–6.5)
NEUT%: 54.3 % (ref 38.4–76.8)
Platelets: 207 10*3/uL (ref 145–400)
RBC: 4.13 10*6/uL (ref 3.70–5.45)
nRBC: 0 % (ref 0–0)

## 2008-09-13 LAB — COMPREHENSIVE METABOLIC PANEL
BUN: 14 mg/dL (ref 6–23)
CO2: 25 mEq/L (ref 19–32)
Calcium: 8.8 mg/dL (ref 8.4–10.5)
Chloride: 104 mEq/L (ref 96–112)
Creatinine, Ser: 0.87 mg/dL (ref 0.40–1.20)
Glucose, Bld: 89 mg/dL (ref 70–99)
Total Bilirubin: 0.4 mg/dL (ref 0.3–1.2)

## 2008-09-13 LAB — CANCER ANTIGEN 27.29: CA 27.29: 58 U/mL — ABNORMAL HIGH (ref 0–39)

## 2008-09-18 ENCOUNTER — Ambulatory Visit (HOSPITAL_COMMUNITY): Admission: RE | Admit: 2008-09-18 | Discharge: 2008-09-18 | Payer: Self-pay | Admitting: Oncology

## 2008-09-20 ENCOUNTER — Encounter (INDEPENDENT_AMBULATORY_CARE_PROVIDER_SITE_OTHER): Payer: Self-pay | Admitting: Internal Medicine

## 2008-10-18 ENCOUNTER — Encounter: Payer: Self-pay | Admitting: Infectious Diseases

## 2008-10-19 ENCOUNTER — Inpatient Hospital Stay (HOSPITAL_COMMUNITY): Admission: EM | Admit: 2008-10-19 | Discharge: 2008-10-20 | Payer: Self-pay | Admitting: Emergency Medicine

## 2008-10-19 ENCOUNTER — Ambulatory Visit: Payer: Self-pay | Admitting: Infectious Diseases

## 2008-10-19 ENCOUNTER — Encounter: Payer: Self-pay | Admitting: Internal Medicine

## 2008-10-19 DIAGNOSIS — N390 Urinary tract infection, site not specified: Secondary | ICD-10-CM

## 2008-10-20 ENCOUNTER — Encounter (INDEPENDENT_AMBULATORY_CARE_PROVIDER_SITE_OTHER): Payer: Self-pay | Admitting: *Deleted

## 2008-10-23 ENCOUNTER — Telehealth (INDEPENDENT_AMBULATORY_CARE_PROVIDER_SITE_OTHER): Payer: Self-pay | Admitting: Internal Medicine

## 2008-10-23 ENCOUNTER — Encounter: Payer: Self-pay | Admitting: Internal Medicine

## 2008-10-23 LAB — CANCER ANTIGEN 27.29: CA 27.29: 60 U/mL — ABNORMAL HIGH (ref 0–39)

## 2008-10-25 ENCOUNTER — Encounter: Payer: Self-pay | Admitting: Internal Medicine

## 2008-10-26 ENCOUNTER — Ambulatory Visit (HOSPITAL_COMMUNITY): Admission: RE | Admit: 2008-10-26 | Discharge: 2008-10-26 | Payer: Self-pay | Admitting: Oncology

## 2008-10-30 ENCOUNTER — Ambulatory Visit: Payer: Self-pay | Admitting: Oncology

## 2008-10-31 ENCOUNTER — Encounter (INDEPENDENT_AMBULATORY_CARE_PROVIDER_SITE_OTHER): Payer: Self-pay | Admitting: Internal Medicine

## 2008-10-31 ENCOUNTER — Ambulatory Visit: Payer: Self-pay | Admitting: Internal Medicine

## 2008-12-27 ENCOUNTER — Ambulatory Visit (HOSPITAL_COMMUNITY): Admission: RE | Admit: 2008-12-27 | Discharge: 2008-12-27 | Payer: Self-pay | Admitting: Orthopedic Surgery

## 2009-01-10 ENCOUNTER — Encounter: Admission: RE | Admit: 2009-01-10 | Discharge: 2009-01-10 | Payer: Self-pay | Admitting: Orthopedic Surgery

## 2009-04-05 ENCOUNTER — Ambulatory Visit: Payer: Self-pay | Admitting: Infectious Diseases

## 2009-04-05 ENCOUNTER — Ambulatory Visit: Payer: Self-pay | Admitting: Internal Medicine

## 2009-04-05 ENCOUNTER — Encounter (INDEPENDENT_AMBULATORY_CARE_PROVIDER_SITE_OTHER): Payer: Self-pay | Admitting: Internal Medicine

## 2009-04-05 ENCOUNTER — Inpatient Hospital Stay (HOSPITAL_COMMUNITY): Admission: AD | Admit: 2009-04-05 | Discharge: 2009-04-06 | Payer: Self-pay | Admitting: Infectious Diseases

## 2009-04-06 ENCOUNTER — Encounter (INDEPENDENT_AMBULATORY_CARE_PROVIDER_SITE_OTHER): Payer: Self-pay | Admitting: Internal Medicine

## 2009-04-09 ENCOUNTER — Ambulatory Visit: Payer: Self-pay | Admitting: Oncology

## 2009-04-11 ENCOUNTER — Encounter: Payer: Self-pay | Admitting: Internal Medicine

## 2009-04-11 LAB — CANCER ANTIGEN 27.29: CA 27.29: 87 U/mL — ABNORMAL HIGH (ref 0–39)

## 2009-05-03 ENCOUNTER — Encounter: Payer: Self-pay | Admitting: Internal Medicine

## 2009-05-29 ENCOUNTER — Ambulatory Visit: Payer: Self-pay | Admitting: Oncology

## 2009-05-30 ENCOUNTER — Ambulatory Visit: Payer: Self-pay | Admitting: Internal Medicine

## 2009-05-30 ENCOUNTER — Inpatient Hospital Stay (HOSPITAL_COMMUNITY): Admission: EM | Admit: 2009-05-30 | Discharge: 2009-05-31 | Payer: Self-pay | Admitting: Emergency Medicine

## 2009-05-31 ENCOUNTER — Encounter (INDEPENDENT_AMBULATORY_CARE_PROVIDER_SITE_OTHER): Payer: Self-pay | Admitting: Dermatology

## 2009-06-04 ENCOUNTER — Telehealth: Payer: Self-pay | Admitting: Internal Medicine

## 2009-06-05 ENCOUNTER — Telehealth: Payer: Self-pay | Admitting: Internal Medicine

## 2009-06-06 ENCOUNTER — Encounter: Payer: Self-pay | Admitting: Internal Medicine

## 2009-06-07 ENCOUNTER — Ambulatory Visit: Payer: Self-pay | Admitting: Internal Medicine

## 2009-06-07 DIAGNOSIS — R1013 Epigastric pain: Secondary | ICD-10-CM

## 2009-06-28 ENCOUNTER — Ambulatory Visit: Payer: Self-pay | Admitting: Oncology

## 2009-07-09 ENCOUNTER — Encounter: Payer: Self-pay | Admitting: Internal Medicine

## 2009-08-20 ENCOUNTER — Ambulatory Visit (HOSPITAL_COMMUNITY): Admission: RE | Admit: 2009-08-20 | Discharge: 2009-08-20 | Payer: Self-pay | Admitting: Orthopedic Surgery

## 2009-08-29 ENCOUNTER — Ambulatory Visit: Payer: Self-pay | Admitting: Oncology

## 2009-08-29 ENCOUNTER — Ambulatory Visit (HOSPITAL_COMMUNITY): Admission: RE | Admit: 2009-08-29 | Discharge: 2009-08-29 | Payer: Self-pay | Admitting: Orthopedic Surgery

## 2009-09-25 ENCOUNTER — Telehealth (INDEPENDENT_AMBULATORY_CARE_PROVIDER_SITE_OTHER): Payer: Self-pay | Admitting: *Deleted

## 2009-09-27 ENCOUNTER — Encounter: Payer: Self-pay | Admitting: Internal Medicine

## 2009-09-27 ENCOUNTER — Ambulatory Visit: Payer: Self-pay | Admitting: Internal Medicine

## 2009-09-27 LAB — CONVERTED CEMR LAB
ALT: 8 units/L (ref 0–35)
AST: 12 units/L (ref 0–37)
CO2: 23 meq/L (ref 19–32)
Calcium: 8.9 mg/dL (ref 8.4–10.5)
Chloride: 106 meq/L (ref 96–112)
Creatinine, Ser: 0.84 mg/dL (ref 0.40–1.20)
Potassium: 3.9 meq/L (ref 3.5–5.3)
Sodium: 142 meq/L (ref 135–145)
Total Protein: 6.5 g/dL (ref 6.0–8.3)

## 2009-09-28 ENCOUNTER — Ambulatory Visit: Payer: Self-pay | Admitting: Oncology

## 2009-10-02 ENCOUNTER — Encounter: Payer: Self-pay | Admitting: Internal Medicine

## 2009-10-02 LAB — COMPREHENSIVE METABOLIC PANEL
AST: 14 U/L (ref 0–37)
Albumin: 4.1 g/dL (ref 3.5–5.2)
BUN: 12 mg/dL (ref 6–23)
Calcium: 9.1 mg/dL (ref 8.4–10.5)
Chloride: 106 mEq/L (ref 96–112)
Potassium: 3.7 mEq/L (ref 3.5–5.3)
Total Protein: 6.7 g/dL (ref 6.0–8.3)

## 2009-10-09 ENCOUNTER — Emergency Department (HOSPITAL_COMMUNITY): Admission: EM | Admit: 2009-10-09 | Discharge: 2009-10-09 | Payer: Self-pay | Admitting: Emergency Medicine

## 2009-10-10 ENCOUNTER — Ambulatory Visit: Payer: Self-pay | Admitting: Internal Medicine

## 2009-10-10 DIAGNOSIS — E785 Hyperlipidemia, unspecified: Secondary | ICD-10-CM | POA: Insufficient documentation

## 2009-10-10 LAB — CONVERTED CEMR LAB
Bilirubin Urine: NEGATIVE
Cholesterol: 161 mg/dL (ref 0–200)
Hemoglobin, Urine: NEGATIVE
Ketones, ur: NEGATIVE mg/dL
Specific Gravity, Urine: 1.019 (ref 1.005–1.0)
Urobilinogen, UA: 0.2 (ref 0.0–1.0)

## 2009-10-16 ENCOUNTER — Telehealth: Payer: Self-pay | Admitting: Internal Medicine

## 2009-10-24 ENCOUNTER — Encounter: Admission: RE | Admit: 2009-10-24 | Discharge: 2009-12-17 | Payer: Self-pay | Admitting: Internal Medicine

## 2009-10-30 ENCOUNTER — Encounter: Admission: RE | Admit: 2009-10-30 | Discharge: 2009-10-30 | Payer: Self-pay | Admitting: Orthopedic Surgery

## 2009-10-31 ENCOUNTER — Ambulatory Visit: Payer: Self-pay | Admitting: Oncology

## 2009-11-01 ENCOUNTER — Encounter: Payer: Self-pay | Admitting: Internal Medicine

## 2009-11-01 LAB — CBC WITH DIFFERENTIAL/PLATELET
Basophils Absolute: 0 10*3/uL (ref 0.0–0.1)
EOS%: 0.8 % (ref 0.0–7.0)
HCT: 38.3 % (ref 34.8–46.6)
HGB: 12.7 g/dL (ref 11.6–15.9)
MCH: 31 pg (ref 25.1–34.0)
MCV: 93.3 fL (ref 79.5–101.0)
MONO%: 9.7 % (ref 0.0–14.0)
NEUT%: 69 % (ref 38.4–76.8)
Platelets: 221 10*3/uL (ref 145–400)

## 2009-11-01 LAB — COMPREHENSIVE METABOLIC PANEL
AST: 12 U/L (ref 0–37)
Alkaline Phosphatase: 91 U/L (ref 39–117)
BUN: 21 mg/dL (ref 6–23)
Calcium: 9.3 mg/dL (ref 8.4–10.5)
Chloride: 108 mEq/L (ref 96–112)
Creatinine, Ser: 0.91 mg/dL (ref 0.40–1.20)

## 2009-11-13 ENCOUNTER — Telehealth: Payer: Self-pay | Admitting: Internal Medicine

## 2009-11-14 ENCOUNTER — Ambulatory Visit: Payer: Self-pay | Admitting: Infectious Disease

## 2009-11-14 ENCOUNTER — Encounter: Payer: Self-pay | Admitting: Infectious Disease

## 2009-11-14 ENCOUNTER — Ambulatory Visit: Payer: Self-pay | Admitting: Vascular Surgery

## 2009-11-14 ENCOUNTER — Ambulatory Visit: Admission: RE | Admit: 2009-11-14 | Discharge: 2009-11-14 | Payer: Self-pay | Admitting: Infectious Disease

## 2009-11-14 LAB — CONVERTED CEMR LAB
BUN: 13 mg/dL (ref 6–23)
Basophils Relative: 0 % (ref 0–1)
CO2: 27 meq/L (ref 19–32)
Chloride: 109 meq/L (ref 96–112)
Eosinophils Absolute: 0.1 10*3/uL (ref 0.0–0.7)
Glucose, Bld: 99 mg/dL (ref 70–99)
MCHC: 33.9 g/dL (ref 30.0–36.0)
MCV: 93.7 fL (ref >=78.0)
Monocytes Relative: 8 % (ref 3–12)
Neutrophils Relative %: 62 % (ref 43–77)
Platelets: 209 10*3/uL (ref 150–400)
Potassium: 4.4 meq/L (ref 3.5–5.3)
RDW: 13.9 % (ref 11.5–15.5)

## 2009-12-05 ENCOUNTER — Ambulatory Visit: Payer: Self-pay | Admitting: Internal Medicine

## 2009-12-06 ENCOUNTER — Encounter: Admission: RE | Admit: 2009-12-06 | Discharge: 2009-12-06 | Payer: Self-pay | Admitting: Orthopedic Surgery

## 2009-12-10 ENCOUNTER — Encounter: Admission: RE | Admit: 2009-12-10 | Discharge: 2009-12-10 | Payer: Self-pay | Admitting: Internal Medicine

## 2009-12-10 LAB — HM MAMMOGRAPHY

## 2009-12-19 ENCOUNTER — Encounter: Admission: RE | Admit: 2009-12-19 | Discharge: 2009-12-19 | Payer: Self-pay | Admitting: Orthopedic Surgery

## 2009-12-27 ENCOUNTER — Ambulatory Visit: Payer: Self-pay | Admitting: Oncology

## 2010-01-29 ENCOUNTER — Encounter: Payer: Self-pay | Admitting: Internal Medicine

## 2010-01-31 ENCOUNTER — Ambulatory Visit: Payer: Self-pay | Admitting: Oncology

## 2010-02-23 IMAGING — CT CT PELVIS W/ CM
3 of 8 series · 14 of 46 positions shown, 18 images · IV contrast (omnipaque)
Comparison: CT chest abdomen pelvis 09/18/2008.

CT ABDOMEN

CLINICAL DATA: Question retroperitoneal hemorrhage, evaluate
metastatic disease, low back pain

CT ABDOMEN AND PELVIS WITH CONTRAST
TECHNIQUE: Multidetector CT imaging of the abdomen and pelvis was
performed using the standard protocol following bolus
administration of intravenous contrast.
Contrast: 100 ml Omnipaque 300

[Series 2: rtn a/p with · axial · 0.81mm/px · z∈[-422,-7]mm · 11 of 95 slices shown, 15 images]
[im 6/95  soft-tissue]
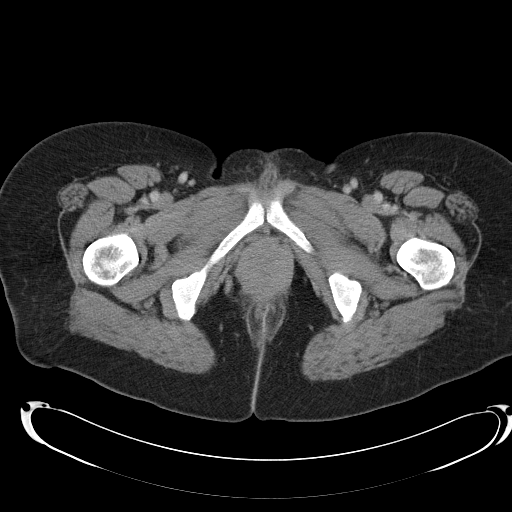
[im 6/95  bone]
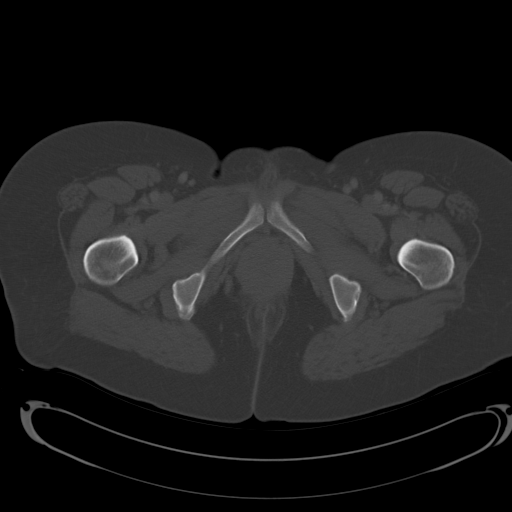
[im 16/95  soft-tissue]
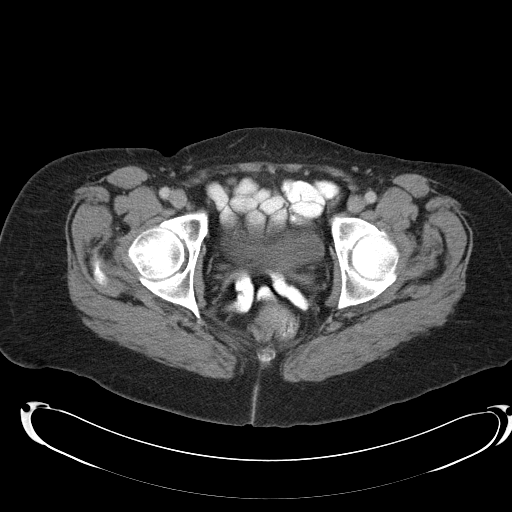
[im 27/95  soft-tissue]
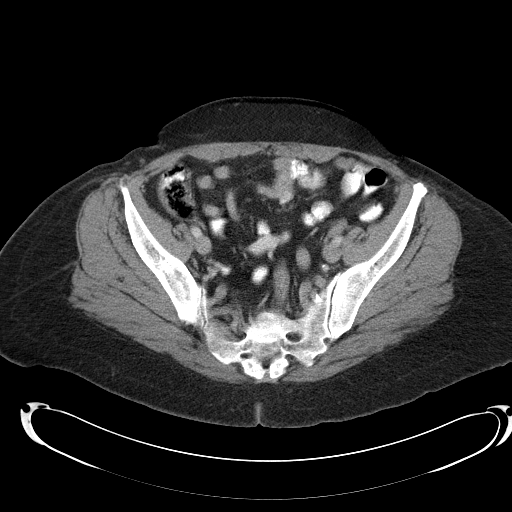
[im 37/95  soft-tissue]
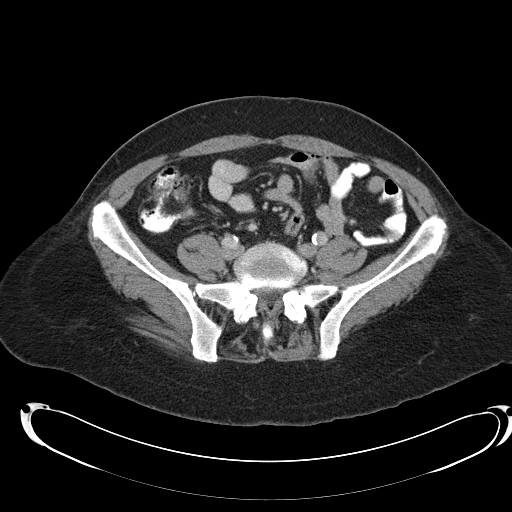
[im 48/95  soft-tissue]
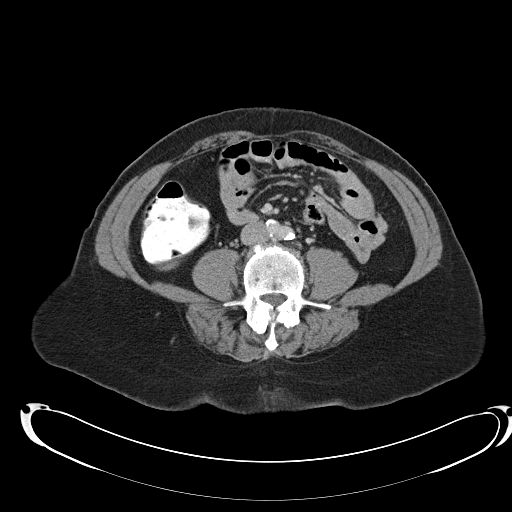
[im 58/95  soft-tissue]
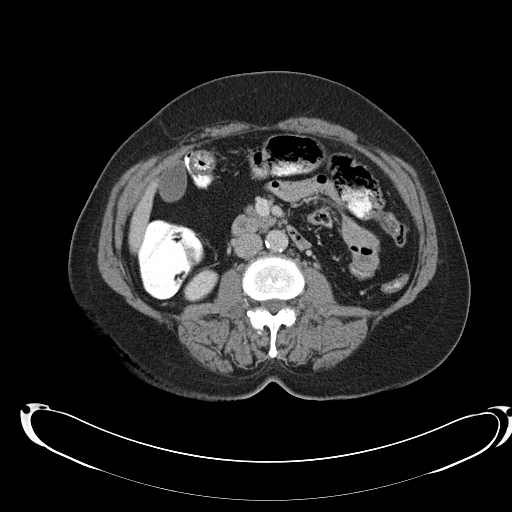
[im 68/95  soft-tissue]
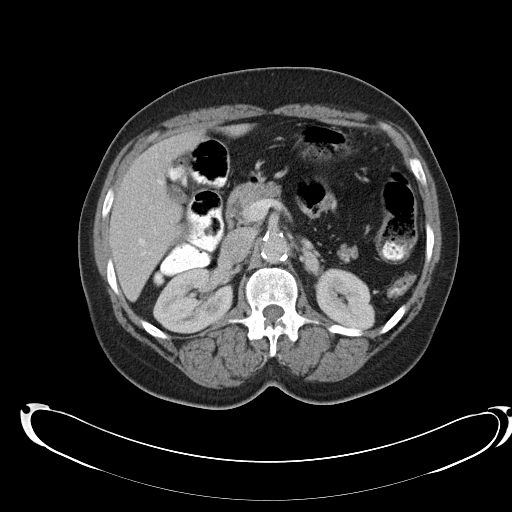
[im 74/95  lung]
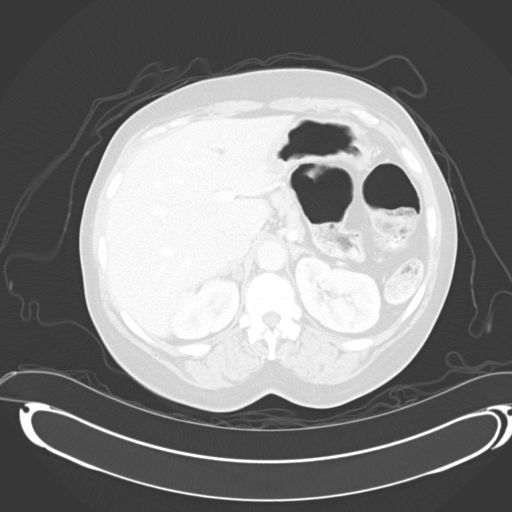
[im 79/95  soft-tissue]
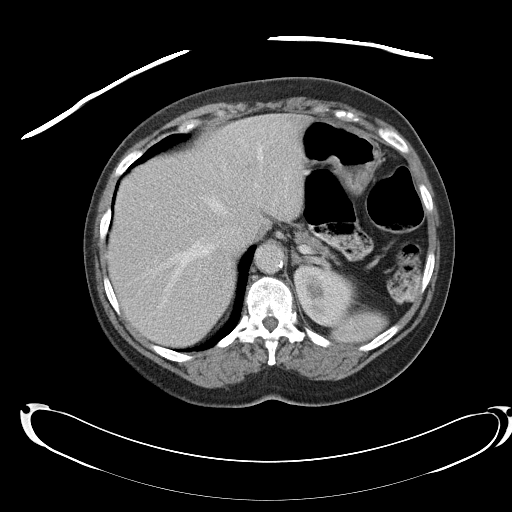
[im 79/95  lung]
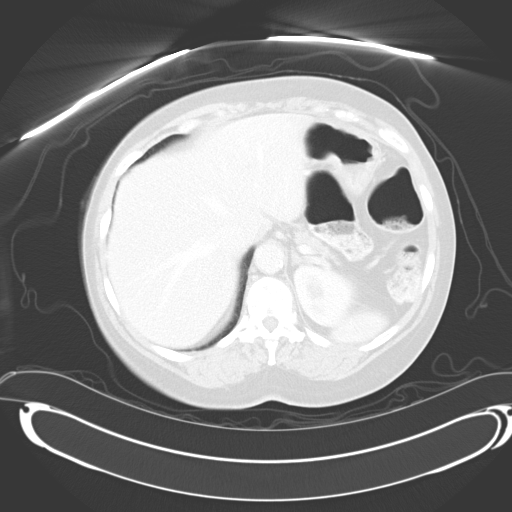
[im 84/95  lung]
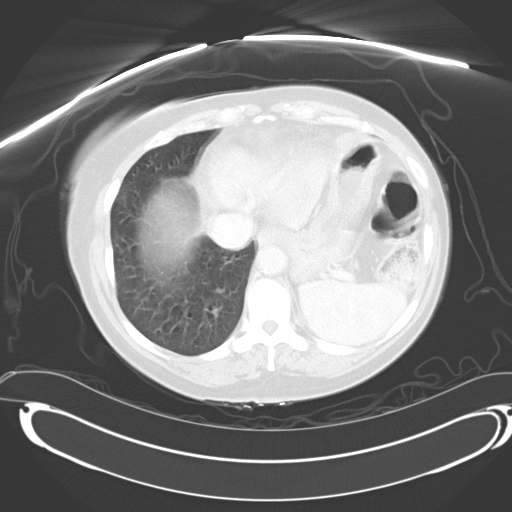
[im 89/95  soft-tissue]
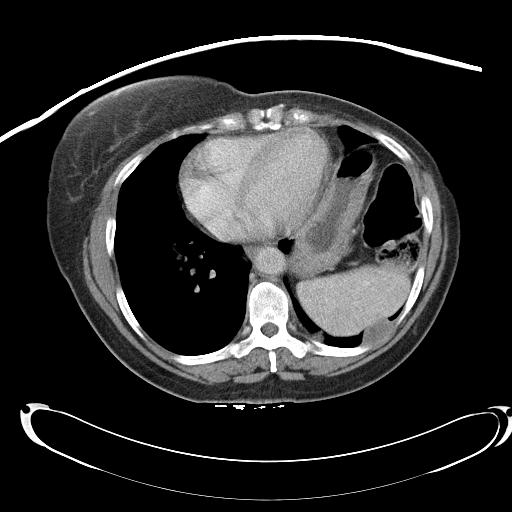
[im 89/95  lung]
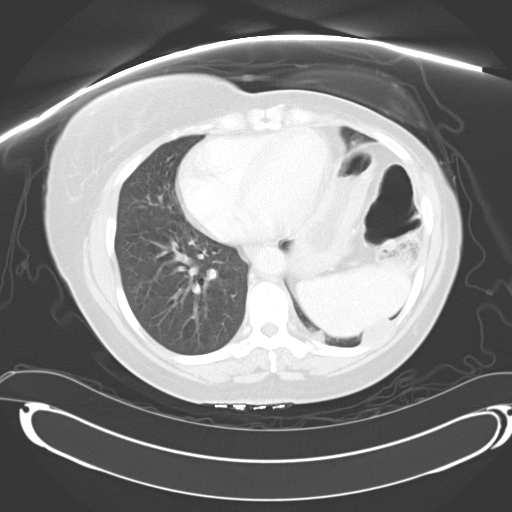
[im 89/95  bone]
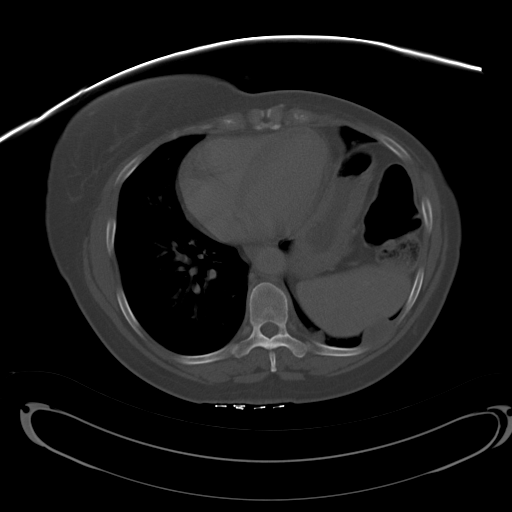

[Series 603: <mpr thick range(1)> · coronal · 0.93mm/px · 2 of 109 slices shown]
[im 37/109  soft-tissue]
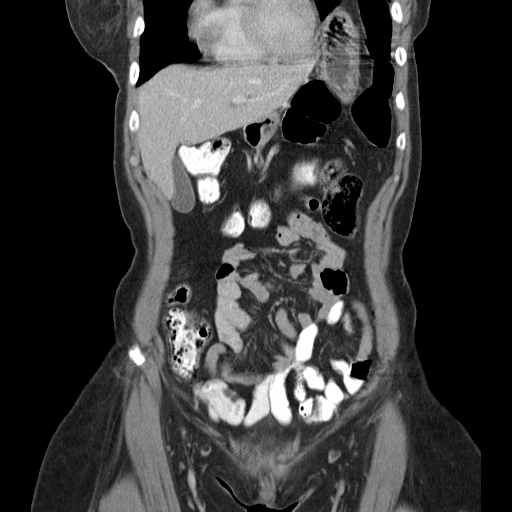
[im 73/109  soft-tissue]
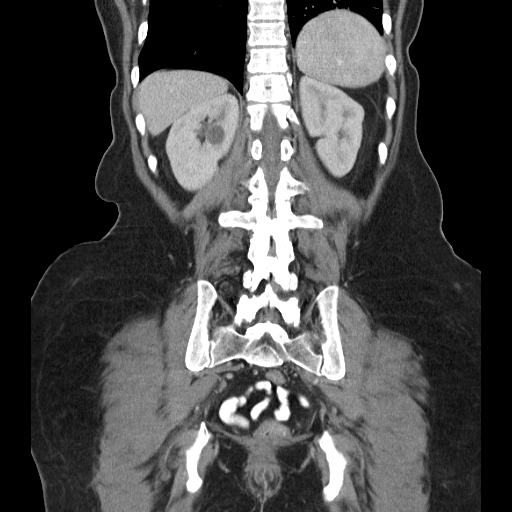

[Series 605: <mpr thick range(3)> · sagittal · 0.93mm/px · 1 of 107 slices shown]
[im 36/107  soft-tissue]
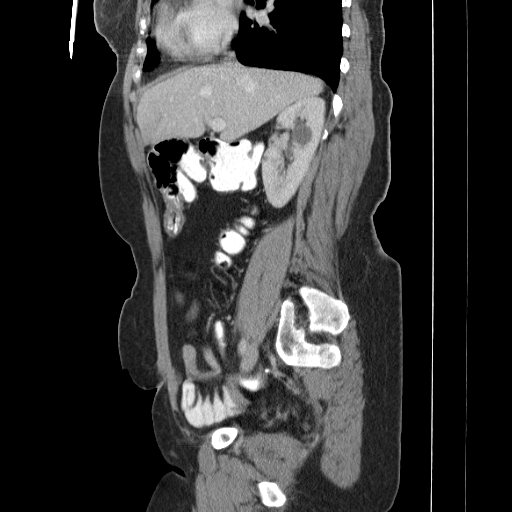

[14 of 46 positions shown; findings below may reference images not displayed]

FINDINGS: Pleural based nodular thickening at the left lung bases
unchanged from prior and measures 3 cm by 1.6 cm.  No new nodules
are evident.

Small lymph nodes in the precordial space anterior to the left
ventricle lobe (image #3) also unchanged.

Within the inferior right hepatic lobe there are two small
hypodensities which were are not clearly evident on comparison.
(Image #33).  Subcapsular hypodensity in the left hepatic lobe and
adjacent to the falciform ligament is unchanged (image #26) the
gallbladder, pancreas, and spleen appear normal.  Small ssplenule
anterior to the spleen.  There is mild enlargement of the left
adrenal gland unchanged from prior.  There is also thickening of
the medial limb of the right adrenal gland.

Within the mid right kidney, there is a 17 mm round hypodense
lesion which cannot be fully characterized as simple cyst.  Small
bilateral cortical hypodensities  are too small to characterize.

The stomach appears normal.  The left hemidiaphragm is elevated
unchanged from prior.  The small bowel and cecum appear normal.
The colon appears normal.

Abdominal aorta is normal caliber.  No evidence of retroperitoneal
lymphadenopathy.  No evidence of retroperitoneal hemorrhage.
IMPRESSION: 1.  There is no significant interval change from prior.  No
evidence of retroperitoneal hemorrhage.
2  Suspicion for liver metastasis.  Two lesions within the inferior
right hepatic lobe are more conspicuous than prior.
3.  No change in pleural based nodular densities at the left lung
base.

4.  Very low density lesion in the right kidney cannot be fully
characterized.

CT PELVIS
FINDINGS: No free fluid in the pelvis.  No evidence of
retroperitoneal hematoma.  The bladder is normal.  Post
hysterectomy anatomy.  The sigmoid colon is redundant extending
into the upper abdomen.  No evidence of obstruction.

There is a 8 mm short axis left external iliac node (image 69).  No
additional evidence of pelvic lymphadenopathy.  Review of  bone
windows demonstrates no aggressive osseous lesions.
IMPRESSION: 1.  There is no evidence of pelvic metastasis. No evidence of
retroperitoneal hemorrhage.
2.  Small left external iliac lymph node.  Recommend attention on
follow-up.

## 2010-02-28 ENCOUNTER — Encounter: Payer: Self-pay | Admitting: Internal Medicine

## 2010-03-15 ENCOUNTER — Encounter: Admission: RE | Admit: 2010-03-15 | Discharge: 2010-03-15 | Payer: Self-pay | Admitting: Orthopedic Surgery

## 2010-03-25 ENCOUNTER — Ambulatory Visit (HOSPITAL_COMMUNITY): Admission: RE | Admit: 2010-03-25 | Discharge: 2010-03-25 | Payer: Self-pay | Admitting: Orthopedic Surgery

## 2010-03-28 ENCOUNTER — Ambulatory Visit: Payer: Self-pay | Admitting: Oncology

## 2010-03-29 ENCOUNTER — Ambulatory Visit (HOSPITAL_COMMUNITY): Admission: RE | Admit: 2010-03-29 | Discharge: 2010-03-29 | Payer: Self-pay | Admitting: Oncology

## 2010-04-01 ENCOUNTER — Encounter: Payer: Self-pay | Admitting: Internal Medicine

## 2010-04-01 LAB — CBC WITH DIFFERENTIAL/PLATELET
Eosinophils Absolute: 0 10*3/uL (ref 0.0–0.5)
HCT: 38.2 % (ref 34.8–46.6)
LYMPH%: 27.6 % (ref 14.0–49.7)
MCHC: 33.3 g/dL (ref 31.5–36.0)
MONO#: 0.5 10*3/uL (ref 0.1–0.9)
NEUT#: 3.2 10*3/uL (ref 1.5–6.5)
NEUT%: 61.5 % (ref 38.4–76.8)
Platelets: 227 10*3/uL (ref 145–400)
WBC: 5.2 10*3/uL (ref 3.9–10.3)

## 2010-04-11 ENCOUNTER — Inpatient Hospital Stay (HOSPITAL_COMMUNITY): Admission: RE | Admit: 2010-04-11 | Discharge: 2010-04-12 | Payer: Self-pay | Admitting: Orthopedic Surgery

## 2010-05-02 ENCOUNTER — Ambulatory Visit: Payer: Self-pay | Admitting: Oncology

## 2010-05-02 ENCOUNTER — Encounter: Payer: Self-pay | Admitting: Internal Medicine

## 2010-05-02 ENCOUNTER — Telehealth: Payer: Self-pay | Admitting: Internal Medicine

## 2010-05-06 ENCOUNTER — Telehealth: Payer: Self-pay | Admitting: *Deleted

## 2010-05-07 ENCOUNTER — Emergency Department (HOSPITAL_COMMUNITY): Admission: EM | Admit: 2010-05-07 | Discharge: 2010-05-07 | Payer: Self-pay | Admitting: Family Medicine

## 2010-05-15 ENCOUNTER — Encounter
Admission: RE | Admit: 2010-05-15 | Discharge: 2010-06-27 | Payer: Self-pay | Source: Home / Self Care | Attending: Orthopedic Surgery | Admitting: Orthopedic Surgery

## 2010-06-04 ENCOUNTER — Ambulatory Visit: Payer: Self-pay | Admitting: Oncology

## 2010-06-11 ENCOUNTER — Encounter
Admission: RE | Admit: 2010-06-11 | Discharge: 2010-06-11 | Payer: Self-pay | Source: Home / Self Care | Attending: Orthopedic Surgery | Admitting: Orthopedic Surgery

## 2010-06-19 ENCOUNTER — Encounter: Payer: Self-pay | Admitting: Internal Medicine

## 2010-06-19 LAB — COMPREHENSIVE METABOLIC PANEL
AST: 12 U/L (ref 0–37)
Albumin: 4 g/dL (ref 3.5–5.2)
Alkaline Phosphatase: 84 U/L (ref 39–117)
BUN: 16 mg/dL (ref 6–23)
Creatinine, Ser: 0.9 mg/dL (ref 0.40–1.20)
Potassium: 3.9 mEq/L (ref 3.5–5.3)
Total Bilirubin: 0.4 mg/dL (ref 0.3–1.2)

## 2010-07-01 ENCOUNTER — Encounter
Admission: RE | Admit: 2010-07-01 | Discharge: 2010-07-04 | Payer: Self-pay | Source: Home / Self Care | Attending: Orthopedic Surgery | Admitting: Orthopedic Surgery

## 2010-07-20 ENCOUNTER — Encounter: Payer: Self-pay | Admitting: Oncology

## 2010-07-21 ENCOUNTER — Encounter: Payer: Self-pay | Admitting: Internal Medicine

## 2010-07-21 ENCOUNTER — Encounter: Payer: Self-pay | Admitting: Hospitalist

## 2010-07-21 ENCOUNTER — Encounter: Payer: Self-pay | Admitting: Specialist

## 2010-07-29 ENCOUNTER — Other Ambulatory Visit: Payer: Self-pay | Admitting: Oncology

## 2010-07-29 DIAGNOSIS — C50919 Malignant neoplasm of unspecified site of unspecified female breast: Secondary | ICD-10-CM

## 2010-07-30 ENCOUNTER — Ambulatory Visit (HOSPITAL_COMMUNITY): Admission: RE | Admit: 2010-07-30 | Payer: Self-pay | Source: Home / Self Care | Admitting: Oncology

## 2010-07-30 NOTE — Progress Notes (Signed)
Summary: phone/gg  Phone Note Call from Patient   Caller: Patient Summary of Call: Pt noted knot in upper muscle part of right leg.  Hurts to sit and lay down.  Seems to move when pushed,  tender to touch.  Onse on Friday. No change since friday.  Will see tomorrow in clinic Initial call taken by: Merrie Roof RN,  Nov 13, 2009 10:25 AM  Follow-up for Phone Call        Agree that patient will need to be seen tomorrow. Follow-up by: Julaine Fusi  DO,  Nov 13, 2009 2:07 PM

## 2010-07-30 NOTE — Progress Notes (Signed)
Summary: Soc. 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     I will route to physicians for review.  Chart review shows she has supporting medical diagnoses.   Follow-up for Phone Call        Not sure what to make out of this, I can see her in clinic and discuss this with her, she does have some objective findings on her diagnostic tests that could support her chronich low back pain thanks Tonny Isensee Follow-up by: Mliss Sax MD,  October 16, 2009 11:22 AM

## 2010-07-30 NOTE — Miscellaneous (Signed)
Summary: INITIAL SUMMARY FOR PT SERVICES  INITIAL SUMMARY FOR PT SERVICES   Imported By: Shon Hough 02/04/2010 16:41:42  _____________________________________________________________________  External Attachment:    Type:   Image     Comment:   External Document

## 2010-07-30 NOTE — Progress Notes (Signed)
Summary: possible uti/ hla  Phone Note Call from Patient   Summary of Call: pt calls and states recently she has had urinary frequency and abd pain, she requests appt, she is referred to con urg care. cannot tell exactly how long this has been Initial call taken by: Marin Roberts RN,  May 06, 2010 4:22 PM

## 2010-07-30 NOTE — Assessment & Plan Note (Signed)
Summary: LOSING HER BALANCES A LOT/SB.   Vital Signs:  Patient profile:   65 year old female Height:      68 inches (172.72 cm) Weight:      208.7 pounds (94.86 kg) BMI:     31.85 Temp:     97.1 degrees F (36.17 degrees C) oral Pulse rate:   69 / minute BP sitting:   137 / 80  (right arm)  Vitals Entered By: Stanton Kidney Ditzler RN (October 10, 2009 10:05 AM) Is Patient Diabetic? No Pain Assessment Patient in pain? yes     Location: low left back Intensity: 7 Type: pressing Onset of pain  past day Nutritional Status BMI of > 30 = obese Nutritional Status Detail appetite down  Have you ever been in a relationship where you felt threatened, hurt or afraid?denies   Does patient need assistance? Functional Status Self care Ambulation Normal Comments Went to [facility] ER due to back pain. Freq urinatio past month. Problems with balance x 3 weeks.   Primary Care Provider:  Carlus Pavlov MD   History of Present Illness: 65 yo female with PMH outlined below presnets to [facility] with main concern of increased urination, frequency adn urgency, no dysuria or hematuria,no dribbling, only occasional incontinence. She has been see yesterday in ED and they scanned her belly and told her everything was normal. They have not checked her urine. She also worries about her chronic low back pain and thinks that she has difficulty with balance as a result. She denies any recent falls or traumas, no episodes of chest pain or sob, no palpitations, no headaches or visiual changes, no tinnitus or sensation of room spinning, no dizziness, no weakness. Also denies any specific abdominal concerns. Denies changes inappetite or weight, no changes in mood or sleep patterns.   Depression History:      The patient denies a depressed mood most of the day and a diminished interest in her usual daily activities.  The patient denies significant weight loss, significant weight gain, insomnia, hypersomnia, psychomotor agitation,  psychomotor retardation, fatigue (loss of energy), feelings of worthlessness (guilt), impaired concentration (indecisiveness), and recurrent thoughts of death or suicide.        The patient denies that she feels like life is not worth living, denies that she wishes that she were dead, and denies that she has thought about ending her life.         Preventive Screening-Counseling & Management  Alcohol-Tobacco     Alcohol drinks/day: 0     Smoking Status: current     Smoking Cessation Counseling: yes     Packs/Day: 0.5     Year Started: AT THE AGE OF 14  Caffeine-Diet-Exercise     Does Patient Exercise: yes     Type of exercise: WALKING     Times/week: 3  Problems Prior to Update: 1)  Abdominal Pain, Epigastric  (ICD-789.06) 2)  Urinary Tract Infection  (ICD-599.0) 3)  Meningitis  (ICD-322.9) 4)  Preventive Health Care  (ICD-V70.0) 5)  Thyroid Stimulating Hormone, Abnormal  (ICD-246.9) 6)  Leg Pain, Left  (ICD-729.5) 7)  Uri  (ICD-465.9) 8)  Asymptomatic Postmenopausal Status  (ICD-V49.81) 9)  Hypertension  (ICD-401.9) 10)  Low Back Pain  (ICD-724.2) 11)  Migraine Headache  (ICD-346.90) 12)  Leg Edema  (ICD-782.3) 13)  Meningitis  (ICD-322.9) 14)  Neuralgia/neuritis Nos  (ICD-729.2) 15)  Caesarean Section, Hx of  (ICD-V39.01) 16)  Mastectomy, Hx of  (ICD-V45.71) 17)  Tenosynovitis  (ICD-727.00)  18)  Herniated Disc  (ICD-722.2) 19)  Breast Cancer, Hx of  (ICD-V10.3) 20)  Asthma  (ICD-493.90)  Medications Prior to Update: 1)  Albuterol 90 Mcg/act Aers (Albuterol) .... Dose/frequency Not in Chart 2)  Flovent Hfa 44 Mcg/act Aero (Fluticasone Propionate  Hfa) .... Dose/frequency Not in Chart 3)  Azmacort 75 Mcg/act Aers (Triamcinolone Acetonide) .... Inhale 2 Puffs Two Times A Day 4)  Singulair 10 Mg Tabs (Montelukast Sodium) .... Take 1 Tablet By Mouth Once A Day 5)  Neurontin 300 Mg Caps (Gabapentin) .... Take 1 Capsule By Mouth At Bedtime 6)  Femara 2.5 Mg Tabs (Letrozole)  .... Take 1 Tablet By Mouth Once A Day 7)  Valproic Acid 250 Mg  Caps (Valproic Acid) .... Take 2 Capsules By Mouth Two Times A Day 8)  Lasix 40 Mg  Tabs (Furosemide) .... Take 1pill By Mouth Daily. 9)  Ranitidine Hcl 150 Mg Caps (Ranitidine Hcl) .... Take 1 Tablet By Mouth Twice A Day.  Current Medications (verified): 1)  Albuterol 90 Mcg/act Aers (Albuterol) .... Dose/frequency Not in Chart 2)  Flovent Hfa 44 Mcg/act Aero (Fluticasone Propionate  Hfa) .... Dose/frequency Not in Chart 3)  Azmacort 75 Mcg/act Aers (Triamcinolone Acetonide) .... Inhale 2 Puffs Two Times A Day 4)  Singulair 10 Mg Tabs (Montelukast Sodium) .... Take 1 Tablet By Mouth Once A Day 5)  Neurontin 300 Mg Caps (Gabapentin) .... Take 1 Capsule By Mouth At Bedtime 6)  Femara 2.5 Mg Tabs (Letrozole) .... Take 1 Tablet By Mouth Once A Day 7)  Valproic Acid 250 Mg  Caps (Valproic Acid) .... Take 2 Capsules By Mouth Two Times A Day 8)  Lasix 40 Mg  Tabs (Furosemide) .... Take 1pill By Mouth Daily. 9)  Ranitidine Hcl 150 Mg Caps (Ranitidine Hcl) .... Take 1 Tablet By Mouth Twice A Day.  Allergies: 1)  ! Fernand Parkins  Past History:  Past Medical History: Last updated: 05/26/2007 Asthma, best PEF=400 Breast cancer, hx of:  metastatic by CT, on Femara.  Mastectomy +S/p radiation X's 6. Dr. Truett Perna, oncology Disc Herniation - h/o ruptured disk., laminectomy L4-L5, Dr. Montez Morita, ortho surgeon Tenosynovitis, left. MRI 05/2003 Radiculitis Caesarean Section X2  Tobacco Abuse Meningitis:  HSV Aug. 2006 Leg Edema - bilateral Hysterectomy Migraines, had MRI brain 01/2006: neg. Imitrex helps; 1/month Low back painwith multiple bulging disks in Lumbar reg. per MRI 09/2005 L3 hemangioma per same MRI s/p left knee replacement 2007 had 3 neg.PAP smears in consecutive years refused colonoscopy - need to re-ask refused influenza and pneumococcal vaccines ESR=35 (01/2006), vit B12=511 (01/2006), Hb=13.1 (01/2006), Cr=0.96  (01/2006)  Past Surgical History: Last updated: 05/26/2007 Hysterectomy Caesarean section mastectomy laminectomy L4-L5 Left knee replacement  Risk Factors: Alcohol Use: 0 (10/10/2009) Exercise: yes (10/10/2009)  Risk Factors: Smoking Status: current (10/10/2009) Packs/Day: 0.5 (10/10/2009)  Social History: Packs/Day:  0.5  Review of Systems       per HPI  Physical Exam  General:  Well-developed,well-nourished,in no acute distress; alert,appropriate and cooperative throughout examination Lungs:  Normal respiratory effort, chest expands symmetrically. Lungs are clear to auscultation, no crackles or wheezes. Heart:  Normal rate and regular rhythm. S1 and S2 normal without gallop, murmur, click, rub or other extra sounds. Abdomen:  Bowel sounds positive,abdomen soft and non-tender without masses, organomegaly or hernias noted. Extremities:  No clubbing, cyanosis, edema, or deformity noted with normal full range of motion of all joints.   Neurologic:  No cranial nerve deficits noted. Station and gait are normal.  Plantar reflexes are down-going bilaterally. DTRs are symmetrical throughout. Sensory, motor and coordinative functions appear intact. Psych:  Cognition and judgment appear intact. Alert and cooperative with normal attention span and concentration. No apparent delusions, illusions, hallucinations   Detailed Back/Spine Exam  Lumbosacral Exam:  Inspection-deformity:    Normal Palpation-spinal tenderness:     Left lower back area >>> right lower back Range of Motion:    Forward Flexion:   15 degrees    Hyperextension:   10 degrees    Right Lateral Bend:   10 degrees    Left Lateral Bend:   10 degrees Squatting:      can not perform Lying Straight Leg Raise:    Right:  negative    Left:  negative Sitting Straight Leg Raise:    Right:  negative    Left:  negative Reverse Straight Leg Raise:    Right:  negative    Left:  negative Contralateral Straight Leg  Raise:    Right:  negative    Left:  negative Sciatic Notch:    There is no sciatic notch tenderness.   Impression & Recommendations:  Problem # 1:  URINARY TRACT INFECTION (ICD-599.0)  Her symptoms are suggestive of UTI will check UA today and follow up on results.   Orders: T-Urinalysis ([id number])  Problem # 2:  HYPERTENSION (ICD-401.9) At goal, cont. the same regimen. Last bmet 08/2009 WNL.  Her updated medication list for this problem includes:    Lasix 40 Mg Tabs (Furosemide) .[name]... Take 1pill by mouth daily.  BP today: 137/80 Prior BP: 132/82 (06/07/2009)  Labs Reviewed: K+: 3.9 (09/27/2009) Creat: : 0.84 (09/27/2009)   Chol: 161 (01/20/2008)   HDL: 61 (01/20/2008)   LDL: 89 (01/20/2008)   TG: 56 (01/20/2008)  Problem # 3:  LOW BACK PAIN (ICD-724.2)  Chronic and last MRI done 2010 showed  no evidence for metastatic disease to the lumbar spine or sacrum (The most significant right-sided disease is a paracentral disc protrusion at L5-S1 which contacts and displaces the right S1 nerveroot within the lateral recess). In addition there was broad-based disc bulging at L4-5 with mild lateral recess and foraminal narrowing, right worse than left and minimal left lateral recess narrowing at L2-3. I don't htink it is necessary to image this area again sionce pt has no new symptoms or at least not worsening symptoms. I will give her short course of pain med and will send her for PT.  Her updated medication list for this problem includes:    Percocet 5-325 Mg Tabs (Oxycodone-acetaminophen) .[name]... Take 1 tablet up to twice daily as needed for pain  Orders: Physical Therapy Referral (PT)  Problem # 4:  HYPERLIPIDEMIA (ICD-272.4)  Will check cholesterol panel todayand will follow up on results.   Orders: T-Lipid Profile ([id number])  Complete Medication List: 1)  Albuterol 90 Mcg/act Aers (Albuterol) .... Dose/frequency not in chart 2)  Flovent Hfa 44 Mcg/act Aero (Fluticasone  propionate  hfa) .... Dose/frequency not in chart 3)  Azmacort 75 Mcg/act Aers (Triamcinolone acetonide) .... Inhale 2 puffs two times a day 4)  Singulair 10 Mg Tabs (Montelukast sodium) .... Take 1 tablet by mouth once a day 5)  Neurontin 300 Mg Caps (Gabapentin) .... Take 1 capsule by mouth at bedtime 6)  Femara 2.5 Mg Tabs (Letrozole) .... Take 1 tablet by mouth once a day 7)  Valproic Acid 250 Mg Caps (Valproic acid) .... Take 2 capsules by mouth two times a day 8)  Lasix  40 Mg Tabs (Furosemide) .... Take 1pill by mouth daily. 9)  Ranitidine Hcl 150 Mg Caps (Ranitidine hcl) .... Take 1 tablet by mouth twice a day. 10)  Percocet 5-325 Mg Tabs (Oxycodone-acetaminophen) .... Take 1 tablet up to twice daily as needed for pain  Patient Instructions: 1)  Please schedule a follow-up appointment in 3 months. 2)  Please check your blood pressure regularly, if it is >170 please call clinic at [phone] Prescriptions: PERCOCET 5-325 MG TABS (OXYCODONE-ACETAMINOPHEN) take 1 tablet up to twice daily as needed for pain  #60 x 0   Entered and Authorized by:   Mliss Sax MD   Signed by:   Mliss Sax MD on 10/10/2009   Method used:   Print then Give to Patient   RxID:   [id number]  Process Orders Check Orders Results:     [facility]: Check successful Order queued for requisitioning for [facility]: October 10, 2009 10:29 AM  Tests Sent for requisitioning (October 10, 2009 10:29 AM):     10/10/2009: [facility] -- T-Urinalysis [[id number]] (signed)     10/10/2009: [facility] -- T-Lipid Profile [phone] (signed)    Prevention & Chronic Care Immunizations   Influenza vaccine: Not documented   Influenza vaccine deferral: Not indicated  (10/10/2009)    Tetanus booster: Not documented   Td booster deferral: Not indicated  (10/10/2009)    Pneumococcal vaccine: Not documented   Pneumococcal vaccine deferral: Not indicated  (10/10/2009)     H. zoster vaccine: Not documented   H. zoster vaccine deferral: Not available  (10/10/2009)  Colorectal Screening   Hemoccult: Negative  (03/01/2003)   Hemoccult action/deferral: Not indicated  (10/10/2009)    Colonoscopy: Not documented   Colonoscopy action/deferral: Not indicated  (10/10/2009)  Other Screening   Pap smear: Not documented   Pap smear action/deferral: Refused  (10/10/2009)    Mammogram: Normal  (04/06/2006)   Mammogram action/deferral: Refused  (10/10/2009)    DXA bone density scan: Not documented   DXA bone density action/deferral: Not indicated  (10/10/2009)   Smoking status: current  (10/10/2009)   Smoking cessation counseling: yes  (10/10/2009)  Lipids   Total Cholesterol: 161  (01/20/2008)   LDL: 89  (01/20/2008)   LDL Direct: Not documented   HDL: 61  (01/20/2008)   Triglycerides: 56  (01/20/2008)    SGOT (AST): 12  (09/27/2009)   SGPT (ALT): 8  (09/27/2009)   Alkaline phosphatase: 83  (09/27/2009)   Total bilirubin: 0.4  (09/27/2009)    Lipid flowsheet reviewed?: Yes   Progress toward LDL goal: At goal  Hypertension   Last Blood Pressure: 137 / 80  (10/10/2009)   Serum creatinine: 0.84  (09/27/2009)   Serum potassium 3.9  (09/27/2009)    Hypertension flowsheet reviewed?: Yes   Progress toward BP goal: At goal  Self-Management Support :    Patient will work on the following items until the next clinic visit to reach self-care goals:     Medications and monitoring: take my medicines every day  (10/10/2009)     Eating: eat more vegetables, use fresh or frozen vegetables, eat foods that are low in salt, eat fruit for snacks and desserts, limit or avoid alcohol  (10/10/2009)     Activity: take a 30 minute walk every day  (10/10/2009)    Hypertension self-management support: Written self-care plan, Education handout, Resources for patients handout  (10/10/2009)   Hypertension self-care plan printed.   Hypertension education handout printed  Lipid self-management support: Resources for patients handout, Written self-care plan  (10/10/2009)   Lipid self-care plan printed.      Resource handout printed.

## 2010-07-30 NOTE — Consult Note (Signed)
[facility]   Imported By: Florinda Marker 08/03/2009 16:46:27  _____________________________________________________________________  External Attachment:    Type:   Image     Comment:   External Document

## 2010-07-30 NOTE — Consult Note (Signed)
Summary: [facility]   Imported By: Florinda Marker 08/03/2009 16:45:40  _____________________________________________________________________  External Attachment:    Type:   Image     Comment:   External Document

## 2010-07-30 NOTE — Consult Note (Signed)
Summary: [facility]   Imported By: Florinda Marker 07/16/2009 14:06:56  _____________________________________________________________________  External Attachment:    Type:   Image     Comment:   External Document

## 2010-07-30 NOTE — Progress Notes (Signed)
Summary: refill/gg  Phone Note Refill Request  on September 25, 2009 9:39 AM  Refills Requested: Medication #1:  VALPROIC ACID 250 MG  CAPS take 2 capsules by mouth two times a day  Method Requested: Electronic Initial call taken by: Merrie Roof RN,  September 25, 2009 9:39 AM    Prescriptions: VALPROIC ACID 250 MG  CAPS (VALPROIC ACID) take 2 capsules by mouth two times a day  #120 x 3   Entered by:   Hartley Barefoot MD   Authorized by:   [name] [facility] ATTENDING DESKTOP   Signed by:   Hartley Barefoot MD on 09/25/2009   Method used:   Telephoned to ...       [facility] [address] [phone]* (retail)       [address]       [place], [place]  [address]       Ph: [phone]       Fax: [phone]   RxID:   [id number]

## 2010-07-30 NOTE — Miscellaneous (Signed)
  Clinical Lists Changes  Orders: Added new Test order of T-Valproic Acid (Depakene) [phone]) - Signed Added new Test order of T-Comprehensive Metabolic Panel ([id number]) - Signed Added new Test order of T-Comprehensive Metabolic Panel ([id number]) - Signed     Process Orders Check Orders Results:     [facility]: ABN not required for this insurance Tests Sent for requisitioning (September 27, 2009 12:07 AM):     09/27/2009: [facility] -- T-Valproic Acid (Depakene) [[id number]] (signed)     09/27/2009: [facility] -- T-Comprehensive Metabolic Panel [phone] (signed)

## 2010-07-30 NOTE — Consult Note (Signed)
Summary[facility]  [facility]   Imported By: Gentry Fitz 03/22/2010 14:09:16  _____________________________________________________________________  External Attachment:    Type:   Image     Comment:   External Document

## 2010-07-30 NOTE — Progress Notes (Signed)
Summary: phone/gg  Phone Note Call from Patient   Summary of Call: ***CALL FROM NEIGHBOR***   neighbor called in and states  pt has someone in to clean her house and [employer] is there every day, and she feels this service is not needed.  She wants to report abuse of the system.  Pt just sits and smokes all day then goes out to [employer]. She feels she is abusing drugs.  She wants to go to her congressman and report this. Neighbor informed we can not talk about our pt with her.  I did run and will scan a [facility] [facility] report.  No drug abuse noted. Initial call taken by: Merrie Roof RN,  May 02, 2010 9:35 AM  Follow-up for Phone Call        Noted. Thank you. Follow-up by: Blanch Media MD,  May 02, 2010 11:55 AM  Additional Follow-up for Phone Call Additional follow up Details #1::        Discussed with [facility].  Does not appear to be anything of substance here for any further action.  Dorothe Pea  May 02, 2010 11:59 AM

## 2010-07-30 NOTE — Consult Note (Signed)
[facility]   Imported By: Margie Billet 01/02/2010 10:46:50  _____________________________________________________________________  External Attachment:    Type:   Image     Comment:   External Document

## 2010-07-30 NOTE — Consult Note (Signed)
Summary: [facility]   Imported By: Louretta Parma 04/26/2010 16:40:08  _____________________________________________________________________  External Attachment:    Type:   Image     Comment:   External Document  Appended Document: [facility] Reviewed. Progression of breast cancer noted on CT. Compliance problems with cancer center appointments and therapy with [facility].

## 2010-07-30 NOTE — Medication Information (Signed)
Summary: RX HISTORYREPORT  RX HISTORYREPORT   Imported By: Margie Billet 05/14/2010 10:12:26  _____________________________________________________________________  External Attachment:    Type:   Image     Comment:   External Document

## 2010-07-30 NOTE — Assessment & Plan Note (Signed)
Summary: follow up/pcp-regalado/[facility]la   Vital Signs:  Patient profile:   65 year old female Height:      68 inches (172.72 cm) Weight:      211.4 pounds (96.09 kg) BMI:     32.26 Temp:     97.4 degrees F (36.33 degrees C) oral Pulse rate:   75 / minute BP sitting:   133 / 79  (right arm)  Vitals Entered By: Stanton Kidney Ditzler RN (December 05, 2009 10:49 AM) Is Patient Diabetic? No Pain Assessment Patient in pain? yes     Location: right leg Intensity: 4 Type: aching Onset of pain  for awhile Nutritional Status BMI of > 30 = obese Nutritional Status Detail appetite good  Have you ever been in a relationship where you felt threatened, hurt or afraid?denies   Does patient need assistance? Functional Status Self care Ambulation Normal Comments FU - doing well.   Primary Care Provider:  Carlus Pavlov MD   History of Present Illness: [name] [name] comes for a f/u visit. Her leg pain has improved, occasionally she has some pain but has improved. No fever/chills. She does have left leg swelling on/off in nature.  Depression History:      The patient denies a depressed mood most of the day and a diminished interest in her usual daily activities.         Preventive Screening-Counseling & Management  Alcohol-Tobacco     Alcohol drinks/day: 0     Smoking Status: current     Smoking Cessation Counseling: yes     Packs/Day: 1/4 ppd     Year Started: AT THE AGE OF 14  Caffeine-Diet-Exercise     Does Patient Exercise: yes     Type of exercise: WALKING     Times/week: 3  Current Medications (verified): 1)  Albuterol 90 Mcg/act Aers (Albuterol) .... Dose/frequency Not in Chart 2)  Flovent Hfa 44 Mcg/act Aero (Fluticasone Propionate  Hfa) .... Dose/frequency Not in Chart 3)  Azmacort 75 Mcg/act Aers (Triamcinolone Acetonide) .... Inhale 2 Puffs Two Times A Day 4)  Singulair 10 Mg Tabs (Montelukast Sodium) .... Take 1 Tablet By Mouth Once A Day 5)  Neurontin 300 Mg Caps (Gabapentin)  .... Take 1 Capsule By Mouth At Bedtime 6)  Femara 2.5 Mg Tabs (Letrozole) .... Take 1 Tablet By Mouth Once A Day 7)  Valproic Acid 250 Mg  Caps (Valproic Acid) .... Take 2 Capsules By Mouth Two Times A Day 8)  Lasix 40 Mg  Tabs (Furosemide) .... Take 1pill By Mouth Daily. 9)  Ranitidine Hcl 150 Mg Caps (Ranitidine Hcl) .... Take 1 Tablet By Mouth Twice A Day. 10)  Percocet 5-325 Mg Tabs (Oxycodone-Acetaminophen) .... Take 1 Tablet Up To Twice Daily As Needed For Pain 11)  Flexeril 5 Mg Tabs (Cyclobenzaprine Hcl) .... Take 1 Tablet Twice A Day As Needed For Muscle Pain.  Allergies: 1)  ! * Bananas  Social History: Packs/Day:  1/4 ppd  Review of Systems      See HPI  Physical Exam  Mouth:  pharynx pink and moist.   Lungs:  normal breath sounds, no crackles, and no wheezes.   Heart:  normal rate, regular rhythm, and no murmur.   Extremities:  trace left pedal edema and trace right pedal edema. no calf tenderness on the right.   Neurologic:  alert & oriented X3.     Impression & Recommendations:  Problem # 1:  CALF PAIN, RIGHT (ICD-729.5) resolved. She is informed  to call the clinic if it starts back or has leg swelling.   Complete Medication List: 1)  Albuterol 90 Mcg/act Aers (Albuterol) .... Dose/frequency not in chart 2)  Flovent Hfa 44 Mcg/act Aero (Fluticasone propionate  hfa) .... Dose/frequency not in chart 3)  Azmacort 75 Mcg/act Aers (Triamcinolone acetonide) .... Inhale 2 puffs two times a day 4)  Singulair 10 Mg Tabs (Montelukast sodium) .... Take 1 tablet by mouth once a day 5)  Neurontin 300 Mg Caps (Gabapentin) .... Take 1 capsule by mouth at bedtime 6)  Femara 2.5 Mg Tabs (Letrozole) .... Take 1 tablet by mouth once a day 7)  Valproic Acid 250 Mg Caps (Valproic acid) .... Take 2 capsules by mouth two times a day 8)  Lasix 40 Mg Tabs (Furosemide) .... Take 1pill by mouth daily. 9)  Ranitidine Hcl 150 Mg Caps (Ranitidine hcl) .... Take 1 tablet by mouth twice a  day. 10)  Percocet 5-325 Mg Tabs (Oxycodone-acetaminophen) .... Take 1 tablet up to twice daily as needed for pain 11)  Flexeril 5 Mg Tabs (Cyclobenzaprine hcl) .... Take 1 tablet twice a day as needed for muscle pain.  Patient Instructions: 1)  Please schedule a follow-up appointment in 3 months. 2)  Limit your Sodium (Salt) to less than 2 grams a day(slightly less than 1/2 a teaspoon) to prevent fluid retention, swelling, or worsening of symptoms. 3)  It is important that you exercise regularly at least 20 minutes 5 times a week. If you develop chest pain, have severe difficulty breathing, or feel very tired , stop exercising immediately and seek medical attention. 4)  Check your Blood Pressure regularly. If it is above: you should make an appointment.   Prevention & Chronic Care Immunizations   Influenza vaccine: Not documented   Influenza vaccine deferral: Not indicated  (10/10/2009)    Tetanus booster: Not documented   Td booster deferral: Not indicated  (10/10/2009)    Pneumococcal vaccine: Not documented   Pneumococcal vaccine deferral: Not indicated  (10/10/2009)    H. zoster vaccine: Not documented   H. zoster vaccine deferral: Not available  (10/10/2009)  Colorectal Screening   Hemoccult: Negative  (03/01/2003)   Hemoccult action/deferral: Not indicated  (10/10/2009)    Colonoscopy: Not documented   Colonoscopy action/deferral: Not indicated  (10/10/2009)  Other Screening   Pap smear: Not documented   Pap smear action/deferral: Refused  (10/10/2009)    Mammogram: Normal  (04/06/2006)   Mammogram action/deferral: Ordered  (11/14/2009)    DXA bone density scan: Not documented   DXA bone density action/deferral: Not indicated  (10/10/2009)   Smoking status: current  (12/05/2009)   Smoking cessation counseling: yes  (12/05/2009)  Lipids   Total Cholesterol: 161  (10/10/2009)   LDL: 97  (10/10/2009)   LDL Direct: Not documented   HDL: 55  (10/10/2009)    Triglycerides: 43  (10/10/2009)    SGOT (AST): 12  (09/27/2009)   SGPT (ALT): 8  (09/27/2009)   Alkaline phosphatase: 83  (09/27/2009)   Total bilirubin: 0.4  (09/27/2009)    Lipid flowsheet reviewed?: Yes   Progress toward LDL goal: Unchanged  Hypertension   Last Blood Pressure: 133 / 79  (12/05/2009)   Serum creatinine: 0.91  (11/14/2009)   Serum potassium 4.4  (11/14/2009)    Hypertension flowsheet reviewed?: Yes   Progress toward BP goal: At goal  Self-Management Support :   Personal Goals (by the next clinic visit) :      Personal blood  pressure goal: 140/90  (11/14/2009)     Personal LDL goal: 100  (11/14/2009)    Patient will work on the following items until the next clinic visit to reach self-care goals:     Medications and monitoring: take my medicines every day  (12/05/2009)     Eating: eat more vegetables, use fresh or frozen vegetables, eat foods that are low in salt, eat fruit for snacks and desserts, limit or avoid alcohol  (12/05/2009)     Activity: take a 30 minute walk every day  (12/05/2009)    Hypertension self-management support: Written self-care plan, Education handout, Resources for patients handout  (12/05/2009)   Hypertension self-care plan printed.   Hypertension education handout printed    Lipid self-management support: Written self-care plan, Education handout, Resources for patients handout  (12/05/2009)   Lipid self-care plan printed.   Lipid education handout printed      Resource handout printed.

## 2010-07-30 NOTE — Letter (Signed)
Summary: [facility]  [facility]   Imported By: Margie Billet 11/27/2009 12:04:20  _____________________________________________________________________  External Attachment:    Type:   Image     Comment:   External Document

## 2010-07-30 NOTE — Assessment & Plan Note (Signed)
Summary: knot on leg/gg   Vital Signs:  Patient profile:   65 year old female Height:      68 inches (172.72 cm) Weight:      210.5 pounds (95.68 kg) BMI:     32.12 Temp:     98.5 degrees F (36.94 degrees C) oral Pulse rate:   80 / minute BP sitting:   147 / 82  (right arm) Cuff size:   regular  Vitals Entered By: Cynda Familia Duncan Dull) (Nov 14, 2009 2:14 PM) CC: right calf x 5 days, feels like a knot, pt states knot has moved around Is Patient Diabetic? No Pain Assessment Patient in pain? yes     Location: right calf Intensity: 8 Type: sharp Onset of pain  sudden onset 5 days ago Nutritional Status BMI of > 30 = obese  Have you ever been in a relationship where you felt threatened, hurt or afraid?No   Does patient need assistance? Functional Status Self care Ambulation Impaired:Risk for fall Comments 2/2 right leg pain   Primary Care Provider:  Carlus Pavlov MD  CC:  right calf x 5 days, feels like a knot, and pt states knot has moved around.  History of Present Illness: 65 year old with Past Medical History: Asthma, best PEF=400 Breast cancer, hx of:  metastatic by CT, on Femara.  Mastectomy +S/p radiation X's 6. Dr. Truett Perna, oncology Disc Herniation - h/o ruptured disk., laminectomy L4-L5, Dr. Montez Morita, ortho surgeon Tenosynovitis, left. MRI 05/2003 Radiculitis Caesarean Section X2  Tobacco Abuse Meningitis:  HSV Aug. 2006 Leg Edema - bilateral Hysterectomy Migraines, had MRI brain 01/2006: neg. Imitrex helps; 1/month Low back painwith multiple bulging disks in Lumbar reg. per MRI 09/2005 L3 hemangioma per same MRI s/p left knee replacement 2007 had 3 neg.PAP smears in consecutive years refused colonoscopy - need to re-ask refused influenza and pneumococcal vaccines ESR=35 (01/2006), vit B12=511 (01/2006), Hb=13.1 (01/2006), Cr=0.96 (01/2006)   She was in [place] over over the weekend, when she started to have pain on her right calf, she felt a  knot on her leg. She denies fever, no knee pain, feels leg is swell sarted 6 days ago. she relates pain is intermittent, 8/10,.   Preventive Screening-Counseling & Management  Alcohol-Tobacco     Alcohol drinks/day: 0     Smoking Status: current     Smoking Cessation Counseling: yes     Packs/Day: 0.75     Year Started: AT THE AGE OF 14  Current Medications (verified): 1)  Albuterol 90 Mcg/act Aers (Albuterol) .... Dose/frequency Not in Chart 2)  Flovent Hfa 44 Mcg/act Aero (Fluticasone Propionate  Hfa) .... Dose/frequency Not in Chart 3)  Azmacort 75 Mcg/act Aers (Triamcinolone Acetonide) .... Inhale 2 Puffs Two Times A Day 4)  Singulair 10 Mg Tabs (Montelukast Sodium) .... Take 1 Tablet By Mouth Once A Day 5)  Neurontin 300 Mg Caps (Gabapentin) .... Take 1 Capsule By Mouth At Bedtime 6)  Femara 2.5 Mg Tabs (Letrozole) .... Take 1 Tablet By Mouth Once A Day 7)  Valproic Acid 250 Mg  Caps (Valproic Acid) .... Take 2 Capsules By Mouth Two Times A Day 8)  Lasix 40 Mg  Tabs (Furosemide) .... Take 1pill By Mouth Daily. 9)  Ranitidine Hcl 150 Mg Caps (Ranitidine Hcl) .... Take 1 Tablet By Mouth Twice A Day. 10)  Percocet 5-325 Mg Tabs (Oxycodone-Acetaminophen) .... Take 1 Tablet Up To Twice Daily As Needed For Pain  Allergies: 1)  ! * Bananas  Social History: Packs/Day:  0.75  Review of Systems  The patient denies fever, chest pain, syncope, dyspnea on exertion, and hemoptysis.    Physical Exam  General:  alert, well-developed, and well-nourished.   Head:  normocephalic and atraumatic.   Msk:  normal ROM, no joint tenderness, no joint swelling, no joint warmth, no redness over joints, and no joint deformities.  Pain on palpation on right calf, very tender to light touch, no swelling , no redness, no mass, no fluctuation.    Impression & Recommendations:  Problem # 1:  CALF PAIN, RIGHT (ICD-729.5) This is likely muscle spasm. I could not feel a lump or mass, no fluctuation, no  swelling, no redness, no sign of infection. Doppler negative for DVT or backer cyst. I will prescribe flexeril and she will continue to take percocet for pain. I will check Bmet and CBC. If pain doesnt improved she might need Ct or MRI due to her history of cancer.  Orders: LE Venous Duplex (DVT) (DVT) T-Basic Metabolic Panel ([id number]) T-CBC w/Diff ([id number]) T-CK Total [phone] [id number])  Complete Medication List: 1)  Albuterol 90 Mcg/act Aers (Albuterol) .... Dose/frequency not in chart 2)  Flovent Hfa 44 Mcg/act Aero (Fluticasone propionate  hfa) .... Dose/frequency not in chart 3)  Azmacort 75 Mcg/act Aers (Triamcinolone acetonide) .... Inhale 2 puffs two times a day 4)  Singulair 10 Mg Tabs (Montelukast sodium) .... Take 1 tablet by mouth once a day 5)  Neurontin 300 Mg Caps (Gabapentin) .... Take 1 capsule by mouth at bedtime 6)  Femara 2.5 Mg Tabs (Letrozole) .... Take 1 tablet by mouth once a day 7)  Valproic Acid 250 Mg Caps (Valproic acid) .... Take 2 capsules by mouth two times a day 8)  Lasix 40 Mg Tabs (Furosemide) .... Take 1pill by mouth daily. 9)  Ranitidine Hcl 150 Mg Caps (Ranitidine hcl) .... Take 1 tablet by mouth twice a day. 10)  Percocet 5-325 Mg Tabs (Oxycodone-acetaminophen) .... Take 1 tablet up to twice daily as needed for pain 11)  Flexeril 5 Mg Tabs (Cyclobenzaprine hcl) .... Take 1 tablet twice a day as needed for muscle pain.  Other Orders: Mammogram (Screening) (Mammo)  Patient Instructions: 1)  Please schedule a follow-up appointment in 2 weeks. Prescriptions: FLEXERIL 5 MG TABS (CYCLOBENZAPRINE HCL) Take 1 tablet twice a day as needed for muscle pain.  #30 x 0   Entered and Authorized by:   Hartley Barefoot MD   Signed by:   Hartley Barefoot MD on 11/14/2009   Method used:   Print then Give to Patient   RxID:   [id number]  Process Orders Check Orders Results:     [facility]: Check successful Tests Sent for requisitioning (Nov 14, 2009 4:37 PM):     11/14/2009: [facility] -- T-Basic Metabolic Panel [phone] (signed)     11/14/2009: [facility] -- T-CBC w/Diff [[id number]] (signed)     11/14/2009: [facility] -- T-CK Total [[id number]] (signed)    Prevention & Chronic Care Immunizations   Influenza vaccine: Not documented   Influenza vaccine deferral: Not indicated  (10/10/2009)    Tetanus booster: Not documented   Td booster deferral: Not indicated  (10/10/2009)    Pneumococcal vaccine: Not documented   Pneumococcal vaccine deferral: Not indicated  (10/10/2009)    H. zoster vaccine: Not documented   H. zoster vaccine deferral: Not available  (10/10/2009)  Colorectal Screening   Hemoccult: Negative  (03/01/2003)   Hemoccult action/deferral: Not  indicated  (10/10/2009)    Colonoscopy: Not documented   Colonoscopy action/deferral: Not indicated  (10/10/2009)  Other Screening   Pap smear: Not documented   Pap smear action/deferral: Refused  (10/10/2009)    Mammogram: Normal  (04/06/2006)   Mammogram action/deferral: Ordered  (11/14/2009)    DXA bone density scan: Not documented   DXA bone density action/deferral: Not indicated  (10/10/2009)   Smoking status: current  (11/14/2009)   Smoking cessation counseling: yes  (11/14/2009)  Lipids   Total Cholesterol: 161  (10/10/2009)   LDL: 97  (10/10/2009)   LDL Direct: Not documented   HDL: 55  (10/10/2009)   Triglycerides: 43  (10/10/2009)    SGOT (AST): 12  (09/27/2009)   SGPT (ALT): 8  (09/27/2009)   Alkaline phosphatase: 83  (09/27/2009)   Total bilirubin: 0.4  (09/27/2009)  Hypertension   Last Blood Pressure: 147 / 82  (11/14/2009)   Serum creatinine: 0.84  (09/27/2009)   Serum potassium 3.9  (09/27/2009)  Self-Management Support :   Personal Goals (by the next clinic visit) :      Personal blood pressure goal: 140/90  (11/14/2009)     Personal LDL goal: 100  (11/14/2009)     Patient will work on the following items until the next clinic visit to reach self-care goals:     Medications and monitoring: take my medicines every day  (11/14/2009)     Eating: eat foods that are low in salt, eat baked foods instead of fried foods  (11/14/2009)     Activity: take a 30 minute walk every day  (10/10/2009)    Hypertension self-management support: Pre-printed educational material, Resources for patients handout, Written self-care plan  (11/14/2009)   Hypertension self-care plan printed.    Lipid self-management support: Pre-printed educational material, Resources for patients handout  (11/14/2009)        Resource handout printed.   Nursing Instructions: Schedule screening mammogram (see order)-prefers afternoon appt    Process Orders Check Orders Results:     [facility]: Check successful Tests Sent for requisitioning (Nov 14, 2009 4:37 PM):     11/14/2009: [facility] -- T-Basic Metabolic Panel [phone] (signed)     11/14/2009: [facility] -- T-CBC w/Diff [[id number]] (signed)     11/14/2009: [facility] -- T-CK Total [[id number]] (signed)

## 2010-08-01 NOTE — Consult Note (Signed)
[facility]   Imported By: Margie Billet 07/09/2010 15:29:22  _____________________________________________________________________  External Attachment:    Type:   Image     Comment:   External Document

## 2010-08-02 ENCOUNTER — Other Ambulatory Visit (HOSPITAL_COMMUNITY): Payer: Self-pay

## 2010-08-03 ENCOUNTER — Encounter: Payer: Self-pay | Admitting: Oncology

## 2010-08-16 ENCOUNTER — Encounter (HOSPITAL_COMMUNITY): Payer: Self-pay

## 2010-08-16 ENCOUNTER — Ambulatory Visit (HOSPITAL_COMMUNITY)
Admission: RE | Admit: 2010-08-16 | Discharge: 2010-08-16 | Disposition: A | Discharge status: Home / Self Care | Payer: PRIVATE HEALTH INSURANCE | Source: Ambulatory Visit | Attending: Oncology | Admitting: Oncology

## 2010-08-16 DIAGNOSIS — C7800 Secondary malignant neoplasm of unspecified lung: Secondary | ICD-10-CM | POA: Insufficient documentation

## 2010-08-16 DIAGNOSIS — C50919 Malignant neoplasm of unspecified site of unspecified female breast: Secondary | ICD-10-CM | POA: Insufficient documentation

## 2010-08-16 HISTORY — DX: Malignant (primary) neoplasm, unspecified: C80.1

## 2010-08-29 ENCOUNTER — Encounter: Payer: Self-pay | Admitting: Internal Medicine

## 2010-09-04 ENCOUNTER — Other Ambulatory Visit: Payer: Self-pay | Admitting: Orthopedic Surgery

## 2010-09-04 DIAGNOSIS — M25512 Pain in left shoulder: Secondary | ICD-10-CM

## 2010-09-05 ENCOUNTER — Ambulatory Visit
Admission: RE | Admit: 2010-09-05 | Discharge: 2010-09-05 | Disposition: A | Discharge status: Home / Self Care | Payer: PRIVATE HEALTH INSURANCE | Source: Ambulatory Visit | Attending: Orthopedic Surgery | Admitting: Orthopedic Surgery

## 2010-09-05 DIAGNOSIS — M25512 Pain in left shoulder: Secondary | ICD-10-CM

## 2010-09-06 ENCOUNTER — Other Ambulatory Visit: Payer: Self-pay | Admitting: Orthopedic Surgery

## 2010-09-06 DIAGNOSIS — M25512 Pain in left shoulder: Secondary | ICD-10-CM

## 2010-09-10 LAB — POCT URINALYSIS DIPSTICK
Bilirubin Urine: NEGATIVE
Ketones, ur: NEGATIVE mg/dL
Specific Gravity, Urine: 1.02 (ref 1.005–1.030)
pH: 8.5 — ABNORMAL HIGH (ref 5.0–8.0)

## 2010-09-11 LAB — COMPREHENSIVE METABOLIC PANEL
ALT: 11 U/L (ref 0–35)
CO2: 26 mEq/L (ref 19–32)
Calcium: 9.4 mg/dL (ref 8.4–10.5)
Chloride: 109 mEq/L (ref 96–112)
Creatinine, Ser: 0.81 mg/dL (ref 0.4–1.2)
GFR calc non Af Amer: >60 mL/min (ref >60)
Glucose, Bld: 100 mg/dL — ABNORMAL HIGH (ref 70–99)
Total Bilirubin: 0.7 mg/dL (ref 0.3–1.2)

## 2010-09-11 LAB — DIFFERENTIAL
Basophils Absolute: 0 10*3/uL (ref 0.0–0.1)
Eosinophils Absolute: 0 10*3/uL (ref 0.0–0.7)
Eosinophils Relative: 1 % (ref 0–5)
Lymphocytes Relative: 26 % (ref 12–46)
Lymphs Abs: 1.6 10*3/uL (ref 0.7–4.0)
Neutrophils Relative %: 65 % (ref 43–77)

## 2010-09-11 LAB — CBC
HCT: 36.6 % (ref 36.0–46.0)
Hemoglobin: 12.4 g/dL (ref 12.0–15.0)
MCH: 31.1 pg (ref 26.0–34.0)
MCHC: 33.9 g/dL (ref 30.0–36.0)
RBC: 3.99 MIL/uL (ref 3.87–5.11)

## 2010-09-11 LAB — URINALYSIS, ROUTINE W REFLEX MICROSCOPIC
Glucose, UA: NEGATIVE mg/dL
Hgb urine dipstick: NEGATIVE
Ketones, ur: NEGATIVE mg/dL
Protein, ur: NEGATIVE mg/dL
Urobilinogen, UA: 0.2 mg/dL (ref 0.0–1.0)

## 2010-09-11 LAB — URINE MICROSCOPIC-ADD ON

## 2010-09-11 LAB — PROTIME-INR
INR: 1.1 (ref 0.00–1.49)
Prothrombin Time: 14.4 seconds (ref 11.6–15.2)

## 2010-09-12 LAB — PROTIME-INR
INR: 1.01 (ref 0.00–1.49)
Prothrombin Time: 13.5 seconds (ref 11.6–15.2)

## 2010-09-12 LAB — COMPREHENSIVE METABOLIC PANEL
Albumin: 3.5 g/dL (ref 3.5–5.2)
Alkaline Phosphatase: 98 U/L (ref 39–117)
BUN: 14 mg/dL (ref 6–23)
Calcium: 9.1 mg/dL (ref 8.4–10.5)
Creatinine, Ser: 0.88 mg/dL (ref 0.4–1.2)
Glucose, Bld: 87 mg/dL (ref 70–99)
Total Protein: 6.3 g/dL (ref 6.0–8.3)

## 2010-09-12 LAB — URINALYSIS, ROUTINE W REFLEX MICROSCOPIC
Hgb urine dipstick: NEGATIVE
Nitrite: NEGATIVE
Protein, ur: NEGATIVE mg/dL
Specific Gravity, Urine: 1.027 (ref 1.005–1.030)
Urobilinogen, UA: 1 mg/dL (ref 0.0–1.0)

## 2010-09-12 LAB — SURGICAL PCR SCREEN
MRSA, PCR: NEGATIVE
Staphylococcus aureus: NEGATIVE

## 2010-09-12 LAB — URINE MICROSCOPIC-ADD ON

## 2010-09-12 LAB — CBC
HCT: 38.7 % (ref 36.0–46.0)
MCHC: 32.8 g/dL (ref 30.0–36.0)
Platelets: 221 10*3/uL (ref 150–400)
RDW: 14.1 % (ref 11.5–15.5)
WBC: 5.9 10*3/uL (ref 4.0–10.5)

## 2010-09-12 LAB — APTT: aPTT: 34 seconds (ref 24–37)

## 2010-09-13 ENCOUNTER — Encounter (HOSPITAL_BASED_OUTPATIENT_CLINIC_OR_DEPARTMENT_OTHER): Payer: PRIVATE HEALTH INSURANCE | Admitting: Oncology

## 2010-09-13 ENCOUNTER — Other Ambulatory Visit: Payer: Self-pay | Admitting: Oncology

## 2010-09-13 DIAGNOSIS — R609 Edema, unspecified: Secondary | ICD-10-CM

## 2010-09-13 DIAGNOSIS — C50919 Malignant neoplasm of unspecified site of unspecified female breast: Secondary | ICD-10-CM

## 2010-09-13 DIAGNOSIS — L98499 Non-pressure chronic ulcer of skin of other sites with unspecified severity: Secondary | ICD-10-CM

## 2010-09-13 DIAGNOSIS — M545 Low back pain: Secondary | ICD-10-CM

## 2010-09-13 LAB — COMPREHENSIVE METABOLIC PANEL
Albumin: 4 g/dL (ref 3.5–5.2)
BUN: 12 mg/dL (ref 6–23)
CO2: 27 mEq/L (ref 19–32)
Glucose, Bld: 111 mg/dL — ABNORMAL HIGH (ref 70–99)
Potassium: 3.9 mEq/L (ref 3.5–5.3)
Sodium: 143 mEq/L (ref 135–145)
Total Protein: 6.5 g/dL (ref 6.0–8.3)

## 2010-09-13 LAB — CANCER ANTIGEN 27.29: CA 27.29: 440 U/mL — ABNORMAL HIGH (ref 0–39)

## 2010-09-16 ENCOUNTER — Ambulatory Visit
Admission: RE | Admit: 2010-09-16 | Discharge: 2010-09-16 | Disposition: A | Discharge status: Home / Self Care | Payer: PRIVATE HEALTH INSURANCE | Source: Ambulatory Visit | Attending: Orthopedic Surgery | Admitting: Orthopedic Surgery

## 2010-09-16 DIAGNOSIS — M25512 Pain in left shoulder: Secondary | ICD-10-CM

## 2010-09-18 LAB — BASIC METABOLIC PANEL
BUN: 12 mg/dL (ref 6–23)
GFR calc Af Amer: >60 mL/min (ref >60)
GFR calc non Af Amer: >60 mL/min (ref >60)
Potassium: 3.5 mEq/L (ref 3.5–5.1)

## 2010-09-18 LAB — COMPREHENSIVE METABOLIC PANEL
ALT: 13 U/L (ref 0–35)
AST: 15 U/L (ref 0–37)
Albumin: 3.4 g/dL — ABNORMAL LOW (ref 3.5–5.2)
Alkaline Phosphatase: 89 U/L (ref 39–117)
CO2: 27 mEq/L (ref 19–32)
Chloride: 110 mEq/L (ref 96–112)
GFR calc Af Amer: >60 mL/min (ref >60)
Potassium: 4.3 mEq/L (ref 3.5–5.1)
Sodium: 143 mEq/L (ref 135–145)
Total Bilirubin: 0.4 mg/dL (ref 0.3–1.2)

## 2010-09-18 LAB — CBC
HCT: 32.2 % — ABNORMAL LOW (ref 36.0–46.0)
Platelets: 193 10*3/uL (ref 150–400)
Platelets: 162 10*3/uL (ref 150–400)
RBC: 3.99 MIL/uL (ref 3.87–5.11)
WBC: 4.7 10*3/uL (ref 4.0–10.5)
WBC: 4 10*3/uL (ref 4.0–10.5)

## 2010-09-18 LAB — DIFFERENTIAL
Lymphocytes Relative: 24 % (ref 12–46)
Lymphs Abs: 1 10*3/uL (ref 0.7–4.0)
Neutrophils Relative %: 65 % (ref 43–77)

## 2010-09-18 LAB — URINALYSIS, ROUTINE W REFLEX MICROSCOPIC
Bilirubin Urine: NEGATIVE
Hgb urine dipstick: NEGATIVE
Ketones, ur: NEGATIVE mg/dL
Protein, ur: NEGATIVE mg/dL
Specific Gravity, Urine: 1.021 (ref 1.005–1.030)
Urobilinogen, UA: 0.2 mg/dL (ref 0.0–1.0)

## 2010-09-18 LAB — URINE MICROSCOPIC-ADD ON

## 2010-09-20 LAB — URINALYSIS, ROUTINE W REFLEX MICROSCOPIC
Bilirubin Urine: NEGATIVE
Nitrite: NEGATIVE
Specific Gravity, Urine: 1.027 (ref 1.005–1.030)
Urobilinogen, UA: 1 mg/dL (ref 0.0–1.0)

## 2010-09-20 LAB — COMPREHENSIVE METABOLIC PANEL
BUN: 14 mg/dL (ref 6–23)
CO2: 28 mEq/L (ref 19–32)
Chloride: 106 mEq/L (ref 96–112)
Creatinine, Ser: 0.92 mg/dL (ref 0.4–1.2)
GFR calc non Af Amer: >60 mL/min (ref >60)
Glucose, Bld: 93 mg/dL (ref 70–99)
Total Bilirubin: 0.5 mg/dL (ref 0.3–1.2)

## 2010-09-20 LAB — CBC
HCT: 37.2 % (ref 36.0–46.0)
MCV: 93.9 fL (ref 78.0–100.0)
RBC: 3.96 MIL/uL (ref 3.87–5.11)
WBC: 4.9 10*3/uL (ref 4.0–10.5)

## 2010-10-01 LAB — CBC
HCT: 38.1 % (ref 36.0–46.0)
Hemoglobin: 12.9 g/dL (ref 12.0–15.0)
MCHC: 33.6 g/dL (ref 30.0–36.0)
MCHC: 33.8 g/dL (ref 30.0–36.0)
MCV: 93.3 fL (ref 78.0–100.0)
RBC: 3.99 MIL/uL (ref 3.87–5.11)
RBC: 4.08 MIL/uL (ref 3.87–5.11)
WBC: 4.5 10*3/uL (ref 4.0–10.5)
WBC: 5.6 10*3/uL (ref 4.0–10.5)

## 2010-10-01 LAB — URINALYSIS, ROUTINE W REFLEX MICROSCOPIC
Bilirubin Urine: NEGATIVE
Glucose, UA: NEGATIVE mg/dL
Hgb urine dipstick: NEGATIVE
Ketones, ur: NEGATIVE mg/dL
pH: 7 (ref 5.0–8.0)

## 2010-10-01 LAB — DIFFERENTIAL
Eosinophils Absolute: 0 10*3/uL (ref 0.0–0.7)
Eosinophils Relative: 1 % (ref 0–5)
Lymphs Abs: 1.4 10*3/uL (ref 0.7–4.0)
Monocytes Absolute: 0.6 10*3/uL (ref 0.1–1.0)
Monocytes Relative: 11 % (ref 3–12)

## 2010-10-01 LAB — URINE MICROSCOPIC-ADD ON

## 2010-10-01 LAB — BASIC METABOLIC PANEL
CO2: 28 mEq/L (ref 19–32)
Chloride: 109 mEq/L (ref 96–112)
Creatinine, Ser: 0.86 mg/dL (ref 0.4–1.2)
GFR calc Af Amer: >60 mL/min (ref >60)
Potassium: 4 mEq/L (ref 3.5–5.1)
Sodium: 142 mEq/L (ref 135–145)

## 2010-10-01 LAB — COMPREHENSIVE METABOLIC PANEL
ALT: 13 U/L (ref 0–35)
AST: 15 U/L (ref 0–37)
Albumin: 3.4 g/dL — ABNORMAL LOW (ref 3.5–5.2)
CO2: 29 mEq/L (ref 19–32)
Calcium: 9.1 mg/dL (ref 8.4–10.5)
GFR calc Af Amer: >60 mL/min (ref >60)
GFR calc non Af Amer: >60 mL/min (ref >60)
Sodium: 143 mEq/L (ref 135–145)

## 2010-10-01 LAB — LIPASE, BLOOD: Lipase: 17 U/L (ref 11–59)

## 2010-10-01 LAB — VALPROIC ACID LEVEL: Valproic Acid Lvl: <10 ug/mL — ABNORMAL LOW (ref 50.0–100.0)

## 2010-10-02 ENCOUNTER — Telehealth: Payer: Self-pay | Admitting: *Deleted

## 2010-10-02 NOTE — Telephone Encounter (Signed)
Pt called stating the valporic acid  Is not helping her headaches.   She has had the headaches for years but they have gotten worse over the last 2 weeks.   She is on percocet and gets relief with that but headache returns. She would like to be seen and change meds.  Appointment given for next week at pt's request.

## 2010-10-03 LAB — BASIC METABOLIC PANEL
CO2: 24 mEq/L (ref 19–32)
CO2: 26 mEq/L (ref 19–32)
Calcium: 8.7 mg/dL (ref 8.4–10.5)
Chloride: 106 mEq/L (ref 96–112)
Chloride: 108 mEq/L (ref 96–112)
Creatinine, Ser: 0.95 mg/dL (ref 0.4–1.2)
GFR calc Af Amer: >60 mL/min (ref >60)
GFR calc Af Amer: >60 mL/min (ref >60)
Glucose, Bld: 110 mg/dL — ABNORMAL HIGH (ref 70–99)
Glucose, Bld: 146 mg/dL — ABNORMAL HIGH (ref 70–99)
Potassium: 3.8 mEq/L (ref 3.5–5.1)
Sodium: 137 mEq/L (ref 135–145)

## 2010-10-03 LAB — PROTIME-INR
INR: 1.05 (ref 0.00–1.49)
Prothrombin Time: 13.6 seconds (ref 11.6–15.2)

## 2010-10-03 LAB — CBC
HCT: 34.1 % — ABNORMAL LOW (ref 36.0–46.0)
Hemoglobin: 11.5 g/dL — ABNORMAL LOW (ref 12.0–15.0)
MCHC: 33.6 g/dL (ref 30.0–36.0)
MCHC: 33.6 g/dL (ref 30.0–36.0)
MCV: 92.9 fL (ref 78.0–100.0)
RBC: 3.69 MIL/uL — ABNORMAL LOW (ref 3.87–5.11)
RBC: 3.96 MIL/uL (ref 3.87–5.11)
RDW: 14 % (ref 11.5–15.5)
RDW: 14.3 % (ref 11.5–15.5)

## 2010-10-03 LAB — HEPATIC FUNCTION PANEL
Alkaline Phosphatase: 83 U/L (ref 39–117)
Total Bilirubin: 0.5 mg/dL (ref 0.3–1.2)
Total Protein: 6.3 g/dL (ref 6.0–8.3)

## 2010-10-03 LAB — URINE MICROSCOPIC-ADD ON

## 2010-10-03 LAB — URINALYSIS, ROUTINE W REFLEX MICROSCOPIC
Glucose, UA: NEGATIVE mg/dL
Hgb urine dipstick: NEGATIVE
Nitrite: NEGATIVE

## 2010-10-03 LAB — URINE CULTURE: Colony Count: NO GROWTH

## 2010-10-07 LAB — COMPREHENSIVE METABOLIC PANEL
ALT: 11 U/L (ref 0–35)
AST: 17 U/L (ref 0–37)
CO2: 28 mEq/L (ref 19–32)
Calcium: 9.4 mg/dL (ref 8.4–10.5)
Creatinine, Ser: 0.81 mg/dL (ref 0.4–1.2)
GFR calc Af Amer: >60 mL/min (ref >60)
GFR calc non Af Amer: >60 mL/min (ref >60)
Glucose, Bld: 96 mg/dL (ref 70–99)
Sodium: 143 mEq/L (ref 135–145)
Total Protein: 6.3 g/dL (ref 6.0–8.3)

## 2010-10-07 LAB — URINALYSIS, ROUTINE W REFLEX MICROSCOPIC
Glucose, UA: NEGATIVE mg/dL
Ketones, ur: NEGATIVE mg/dL
Nitrite: NEGATIVE
Specific Gravity, Urine: 1.019 (ref 1.005–1.030)
pH: 6 (ref 5.0–8.0)

## 2010-10-07 LAB — CBC
MCHC: 33.6 g/dL (ref 30.0–36.0)
MCV: 92.6 fL (ref 78.0–100.0)
RDW: 14.3 % (ref 11.5–15.5)

## 2010-10-09 LAB — URINE MICROSCOPIC-ADD ON

## 2010-10-09 LAB — CSF CELL COUNT WITH DIFFERENTIAL
Eosinophils, CSF: 0 % (ref 0–1)
Lymphs, CSF: 47 % (ref 40–80)
Monocyte-Macrophage-Spinal Fluid: 29 % (ref 15–45)
RBC Count, CSF: 12 /mm3 — ABNORMAL HIGH
RBC Count, CSF: 255 /mm3 — ABNORMAL HIGH
Segmented Neutrophils-CSF: 16 % — ABNORMAL HIGH (ref 0–6)
Tube #: 1
WBC, CSF: 183 /mm3 — ABNORMAL HIGH (ref 0–5)
WBC, CSF: 299 /mm3 — ABNORMAL HIGH (ref 0–5)

## 2010-10-09 LAB — URINALYSIS, ROUTINE W REFLEX MICROSCOPIC
Hgb urine dipstick: NEGATIVE
Nitrite: NEGATIVE
Protein, ur: NEGATIVE mg/dL
Specific Gravity, Urine: 1.012 (ref 1.005–1.030)
Urobilinogen, UA: 0.2 mg/dL (ref 0.0–1.0)

## 2010-10-09 LAB — CULTURE, BLOOD (ROUTINE X 2): Culture: NO GROWTH

## 2010-10-09 LAB — HSV PCR: HSV, PCR: NOT DETECTED

## 2010-10-09 LAB — CBC
HCT: 39.2 % (ref 36.0–46.0)
HCT: 42 % (ref 36.0–46.0)
Hemoglobin: 14 g/dL (ref 12.0–15.0)
MCV: 92.7 fL (ref 78.0–100.0)
Platelets: 181 10*3/uL (ref 150–400)
Platelets: 198 10*3/uL (ref 150–400)
RBC: 3.64 MIL/uL — ABNORMAL LOW (ref 3.87–5.11)
RBC: 4.23 MIL/uL (ref 3.87–5.11)
RBC: 4.55 MIL/uL (ref 3.87–5.11)
RDW: 14.1 % (ref 11.5–15.5)
WBC: 6.8 10*3/uL (ref 4.0–10.5)
WBC: 7.3 10*3/uL (ref 4.0–10.5)

## 2010-10-09 LAB — BASIC METABOLIC PANEL
BUN: 12 mg/dL (ref 6–23)
BUN: 8 mg/dL (ref 6–23)
Chloride: 108 mEq/L (ref 96–112)
Creatinine, Ser: 0.92 mg/dL (ref 0.4–1.2)
GFR calc Af Amer: >60 mL/min (ref >60)
GFR calc Af Amer: >60 mL/min (ref >60)
GFR calc non Af Amer: >60 mL/min (ref >60)
GFR calc non Af Amer: >60 mL/min (ref >60)
Potassium: 4.2 mEq/L (ref 3.5–5.1)
Sodium: 140 mEq/L (ref 135–145)

## 2010-10-09 LAB — COMPREHENSIVE METABOLIC PANEL
ALT: 15 U/L (ref 0–35)
Alkaline Phosphatase: 92 U/L (ref 39–117)
BUN: 9 mg/dL (ref 6–23)
CO2: 22 mEq/L (ref 19–32)
Chloride: 108 mEq/L (ref 96–112)
GFR calc non Af Amer: 60 mL/min — ABNORMAL LOW (ref >60)
Glucose, Bld: 146 mg/dL — ABNORMAL HIGH (ref 70–99)
Potassium: 3.8 mEq/L (ref 3.5–5.1)
Sodium: 137 mEq/L (ref 135–145)
Total Bilirubin: 0.6 mg/dL (ref 0.3–1.2)

## 2010-10-09 LAB — PROTIME-INR
INR: 1.1 (ref 0.00–1.49)
Prothrombin Time: 14.7 seconds (ref 11.6–15.2)

## 2010-10-09 LAB — PROTEIN, CSF: Total  Protein, CSF: 69 mg/dL — ABNORMAL HIGH (ref 15–45)

## 2010-10-09 LAB — URINE CULTURE: Colony Count: >=100000

## 2010-10-09 LAB — VALPROIC ACID LEVEL: Valproic Acid Lvl: <10 ug/mL — ABNORMAL LOW (ref 50.0–100.0)

## 2010-10-09 LAB — DIFFERENTIAL
Basophils Absolute: 0 10*3/uL (ref 0.0–0.1)
Basophils Relative: 0 % (ref 0–1)
Eosinophils Absolute: 0 10*3/uL (ref 0.0–0.7)
Monocytes Relative: 9 % (ref 3–12)
Neutro Abs: 5.2 10*3/uL (ref 1.7–7.7)
Neutrophils Relative %: 69 % (ref 43–77)

## 2010-10-09 LAB — GRAM STAIN

## 2010-10-09 LAB — GLUCOSE, CSF: Glucose, CSF: 65 mg/dL (ref 43–76)

## 2010-10-09 LAB — HEMOGLOBIN A1C: Hgb A1c MFr Bld: 6 % (ref 4.6–6.1)

## 2010-10-09 LAB — HIV ANTIBODY (ROUTINE TESTING W REFLEX): HIV: NONREACTIVE

## 2010-10-23 ENCOUNTER — Ambulatory Visit (INDEPENDENT_AMBULATORY_CARE_PROVIDER_SITE_OTHER): Payer: PRIVATE HEALTH INSURANCE | Admitting: Internal Medicine

## 2010-10-23 ENCOUNTER — Encounter: Payer: Self-pay | Admitting: Internal Medicine

## 2010-10-23 ENCOUNTER — Ambulatory Visit (HOSPITAL_COMMUNITY)
Admission: RE | Admit: 2010-10-23 | Discharge: 2010-10-23 | Disposition: A | Discharge status: Home / Self Care | Payer: PRIVATE HEALTH INSURANCE | Source: Ambulatory Visit | Attending: Internal Medicine | Admitting: Internal Medicine

## 2010-10-23 DIAGNOSIS — F17200 Nicotine dependence, unspecified, uncomplicated: Secondary | ICD-10-CM | POA: Insufficient documentation

## 2010-10-23 DIAGNOSIS — Z9181 History of falling: Secondary | ICD-10-CM

## 2010-10-23 DIAGNOSIS — R0602 Shortness of breath: Secondary | ICD-10-CM | POA: Insufficient documentation

## 2010-10-23 DIAGNOSIS — G589 Mononeuropathy, unspecified: Secondary | ICD-10-CM

## 2010-10-23 DIAGNOSIS — G43909 Migraine, unspecified, not intractable, without status migrainosus: Secondary | ICD-10-CM

## 2010-10-23 DIAGNOSIS — G629 Polyneuropathy, unspecified: Secondary | ICD-10-CM

## 2010-10-23 DIAGNOSIS — G43109 Migraine with aura, not intractable, without status migrainosus: Secondary | ICD-10-CM

## 2010-10-23 DIAGNOSIS — R296 Repeated falls: Secondary | ICD-10-CM

## 2010-10-23 LAB — TSH: TSH: 0.482 u[IU]/mL (ref 0.350–4.500)

## 2010-10-23 LAB — VITAMIN B12: Vitamin B-12: 390 pg/mL (ref 211–911)

## 2010-10-23 LAB — T4, FREE: Free T4: 1.11 ng/dL (ref 0.80–1.80)

## 2010-10-23 MED ORDER — GABAPENTIN 300 MG PO CAPS: 300.0000 mg | ORAL_CAPSULE | Freq: Three times a day (TID) | ORAL | Status: DC

## 2010-10-23 NOTE — Progress Notes (Signed)
  Subjective:    Patient ID: [name] [name], female    DOB: Oct 10, 1945, 65 y.o.   MRN: [id number]  HPI  Patient is a 65 year old female with a past medical history of breast cancer, chronic venostasis, asthma, and migraines. Presents to the outpatient clinic with complaints of falls and more frequent migraines. States that sometimes when she's walking she loses her balance and has to hang onto something, if not she may fall, she recently fell back in October 2011 and had a rotator cuff tear. Reports falling approximately one time a week. Denies any coldness chills or crampy feeling when she falls., Denies loss of consciousness. As for her migraines the patient's was placed on valproic acid for prophylaxis however this has not helped. She has not taken any of her medications except for Femara.    Review of Systems  Musculoskeletal: Negative for myalgias, back pain, joint swelling, arthralgias and gait problem.  Neurological: Positive for tremors, weakness, numbness and headaches. Negative for dizziness, seizures, syncope and facial asymmetry.       [Positive for: Migranes, and falls. [all other systems reviewed and are negative       Objective:   Physical Exam  [vitalsreviewed. Constitutional: She is oriented to person, place, and time. She appears well-developed and well-nourished.  HENT:  Head: Normocephalic and atraumatic.  Eyes: Pupils are equal, round, and reactive to light.  Neck: Normal range of motion. Neck supple. No JVD present. No thyromegaly present.  Cardiovascular: Normal rate, regular rhythm and normal heart sounds.   No murmur heard. Pulmonary/Chest: Effort normal and breath sounds normal. She has no wheezes. She has no rales.  Abdominal: Soft. Bowel sounds are normal.  Musculoskeletal: Normal range of motion. She exhibits no edema.  Neurological: She is alert and oriented to person, place, and time. She displays normal reflexes. No cranial nerve deficit. Coordination  abnormal.  Skin: Skin is warm and dry.          Assessment & Plan:

## 2010-10-23 NOTE — Assessment & Plan Note (Addendum)
Patient reports frequent falls that started recently, approximately one episode a week, denies any precipitating factors, and describes the falls has loss of balance without loss of consciousness.  The etiology of the fall is uncertain at this time but is most likely due to neuropathy, from the history but could be postural hypotention/plpitations/acute rhythm abnormality.  Patient orthostatic vitals are stable.  Given history of breat cancer will order CT head to r/o mets or other IC pathology. Will also check routine labs including CBC, CMET, B12, RPR and will order EKG.   We'll give the patient prescription for gabapentin that this would help with her migraines and may help prevent her from falling.  We'll see the patient back in one week given the high risk of falls. Dr. Reche Dixon saw the patient with me and agrees with the above plan.  Please discuss the case with Dr. Reche Dixon when she returns next week

## 2010-10-23 NOTE — Assessment & Plan Note (Signed)
Patient is not responding well to valproic acid, we'll discontinue this and start the patient on gabapentin which he has not been taking. hopefully prevent the migraines and may help with her falls, also given her fall risk I will give her any blood pressure medications such as a beta blocker or calcium channel blocker for her migraines, we'll not give narcotic medication as this would increase risk of falling

## 2010-10-24 LAB — COMPLETE METABOLIC PANEL WITH GFR
AST: 11 U/L (ref 0–37)
Alkaline Phosphatase: 86 U/L (ref 39–117)
BUN: 19 mg/dL (ref 6–23)
Calcium: 9.7 mg/dL (ref 8.4–10.5)
Creat: 0.89 mg/dL (ref 0.40–1.20)
GFR, Est Non African American: >60 mL/min (ref >60)
Glucose, Bld: 79 mg/dL (ref 70–99)
Total Bilirubin: 0.3 mg/dL (ref 0.3–1.2)

## 2010-10-24 LAB — CBC
HCT: 37.7 % (ref 36.0–46.0)
Hemoglobin: 12.1 g/dL (ref 12.0–15.0)
MCV: 93.8 fL (ref 78.0–100.0)
Platelets: 247 10*3/uL (ref 150–400)
RBC: 4.02 MIL/uL (ref 3.87–5.11)
WBC: 5.6 10*3/uL (ref 4.0–10.5)

## 2010-10-24 LAB — RPR

## 2010-10-24 LAB — VITAMIN D 25 HYDROXY (VIT D DEFICIENCY, FRACTURES): Vit D, 25-Hydroxy: 21 ng/mL — ABNORMAL LOW (ref 30–89)

## 2010-10-29 ENCOUNTER — Ambulatory Visit (HOSPITAL_COMMUNITY)
Admission: RE | Admit: 2010-10-29 | Discharge: 2010-10-29 | Disposition: A | Discharge status: Home / Self Care | Payer: PRIVATE HEALTH INSURANCE | Source: Ambulatory Visit | Attending: Internal Medicine | Admitting: Internal Medicine

## 2010-10-29 ENCOUNTER — Inpatient Hospital Stay (HOSPITAL_COMMUNITY)
Admission: RE | Admit: 2010-10-29 | Discharge: 2010-10-29 | Payer: PRIVATE HEALTH INSURANCE | Source: Ambulatory Visit | Attending: Internal Medicine | Admitting: Internal Medicine

## 2010-10-29 DIAGNOSIS — G43109 Migraine with aura, not intractable, without status migrainosus: Secondary | ICD-10-CM

## 2010-10-29 DIAGNOSIS — R51 Headache: Secondary | ICD-10-CM | POA: Insufficient documentation

## 2010-10-29 DIAGNOSIS — H539 Unspecified visual disturbance: Secondary | ICD-10-CM | POA: Insufficient documentation

## 2010-10-29 DIAGNOSIS — R296 Repeated falls: Secondary | ICD-10-CM

## 2010-10-29 DIAGNOSIS — Z9181 History of falling: Secondary | ICD-10-CM | POA: Insufficient documentation

## 2010-10-29 DIAGNOSIS — J3489 Other specified disorders of nose and nasal sinuses: Secondary | ICD-10-CM | POA: Insufficient documentation

## 2010-10-30 ENCOUNTER — Telehealth: Payer: Self-pay | Admitting: Internal Medicine

## 2010-10-30 ENCOUNTER — Ambulatory Visit: Payer: PRIVATE HEALTH INSURANCE | Admitting: Internal Medicine

## 2010-10-30 NOTE — Telephone Encounter (Signed)
I have added in my note more details for her neurological exam and I also updated her list.

## 2010-10-30 NOTE — Progress Notes (Signed)
  Subjective:    Patient ID: [name] [name], female    DOB: [birth year], 65 y.o.   MRN: [id number]  HPI    Review of Systems     Objective:   Physical Exam  Constitutional: She is oriented to person, place, and time.  Neurological: She is alert and oriented to person, place, and time. She displays tremor. She displays no atrophy and normal reflexes. No cranial nerve deficit or sensory deficit. She exhibits abnormal muscle tone. She displays no seizure activity. Coordination and gait abnormal. GCS eye subscore is 4. GCS verbal subscore is 5. GCS motor subscore is 6.       Pt has severe intermittent tremors in both arms.Unsteady gail, favoring right side. No nystagmus noted.           Assessment & Plan:

## 2010-10-30 NOTE — Telephone Encounter (Signed)
Patient underwent cranial ct scan without contrast-not specific enough to rule out early metastatic disease but given the time course of her symptoms, reassuring  Needs the following:  Dr Birdena Crandall, if you would, please add an addendum to her note to reflect physical exam findings of ataxia and tremor, and note tremor history as well in addendum and update her problem list  Joyce Gross, if you would please have the patient reschedule to see me-may need to be a wed afternoon but patient can tell you

## 2010-11-07 ENCOUNTER — Ambulatory Visit (INDEPENDENT_AMBULATORY_CARE_PROVIDER_SITE_OTHER): Payer: PRIVATE HEALTH INSURANCE | Admitting: Internal Medicine

## 2010-11-07 ENCOUNTER — Telehealth: Payer: Self-pay | Admitting: *Deleted

## 2010-11-07 VITALS — BP 156/93 | HR 82 | Temp 98.7°F | Ht 68.0 in | Wt 212.8 lb

## 2010-11-07 DIAGNOSIS — R51 Headache: Secondary | ICD-10-CM

## 2010-11-07 DIAGNOSIS — G43909 Migraine, unspecified, not intractable, without status migrainosus: Secondary | ICD-10-CM

## 2010-11-07 MED ORDER — HYDROCODONE-ACETAMINOPHEN 5-325 MG PO TABS: 1.0000 | ORAL_TABLET | Freq: Two times a day (BID) | ORAL | Status: DC | PRN

## 2010-11-07 NOTE — Assessment & Plan Note (Addendum)
She has a long history of migraines since she was 65 years of age, but his pain today is different from what he had in the past. Also she says that she has migraine attacks about every 2-3 months and nonmigraine headaches in between. The differential diagnosis of the current pain includes a variant of migraine versus temporal arteritis (not typical tenderness of temporal artery and denies jaw pain and vision changes) versus glaucoma. Also there is a low risk of having metastatic breast cancer as CT head without contrast was negative for a mass and also she had breast cancer about 15 years before. In any case [facility] and she is an appointment on June 2 there. -Will check an ESR for temporal arteritis. -Will refer her to her ophthalmologist, which she already has, for evaluation of possible glaucoma. -Also explained to her about the possible diagnosis and differentials and the possibility of finding nothing by ESR and ophthalmologist and the pain might go away by itself considering her chronic history of migraine. -Gave her a prescription of 30 tablets of Vicodin 5 mg, as she says she has migraine attacks about every 2-3 months once and Percocet helped her in the past. -After discussion with Dr. Aundria Rud, we decided to do Vicodin for breakthrough pain of her migraine. - Also she denied to take Neurontin due to her risk of side effects which are mentioned on the printout from the pharmacy, and even after explaining the rationale for treatment, she still denied.

## 2010-11-07 NOTE — Progress Notes (Signed)
  Subjective:    Patient ID: [name] [name], female    DOB: 01/26/1946, 65 y.o.   MRN: [id number]  HPI [name] [name] is a 65 year old woman with a past with a history of migraine headaches, hypertension, breast cancer in 1997 status post mastectomy, who comes to the clinic with chief complaint of left sided sharp headache started 3 AM this morning. She was seen on 67/25/2012 in the clinic and has a bunch of tests ordered along with CT head to rule out for metastatic breast cancer. She said that the pain started at 3 AM in the morning and work her up from sleep. The pain is sharp on the left side, intermittent, last for a few seconds. Denies any vision changes, tearing in eyes, nasal congestion, jaw pain with eating. She does endorse that this pain is different from the usual migraine pain which he has about once every 2-3 months. She denies any nausea, vomiting, chest pain, palpitations, short of breath, abdominal pain, diarrhea, urinary abnormalities. She didn't start Neurontin which was prescribed during the last visit for her headaches as she was worried about a long list of side effects which was printed out to her from the pharmacy, which included unsteady gait and increased risk of fall.     Review of Systems    as per history of present illness Objective:   Physical Exam    Constitutional: Vital signs reviewed.  Patient is a well-developed and well-nourished, tearful at times during exam, due to pain. Alert and oriented x3.  Head: Normocephalic and atraumatic. Severe Tenderness to palpation on left temporal area. Temporal artery on the left side nontender the Ear: TM not visualized properly on the left due to wax. Mouth: no erythema or exudates, MMM Eyes: PERRL, EOMI, conjunctivae normal, No scleral icterus.  Neck: Supple, Trachea midline normal ROM, No JVD, mass, thyromegaly, or carotid bruit present.  Cardiovascular: RRR, S1 normal, S2 normal, no MRG, pulses symmetric and intact  bilaterally Pulmonary/Chest: CTAB, no wheezes, rales, or rhonchi Abdominal: Soft. Non-tender, non-distended, bowel sounds are normal, no masses, organomegaly, or guarding present.  GU: no CVA tenderness Musculoskeletal: No joint deformities, erythema, or stiffness, ROM full and no nontender Neurological: A&O x3, Strenght is normal and symmetric bilaterally, cranial nerve II-XII are grossly intact, no focal motor deficit, sensory intact to light touch bilaterally.  Skin: Warm, dry and intact. No rash, cyanosis, or clubbing.       Assessment & Plan:

## 2010-11-07 NOTE — Telephone Encounter (Signed)
Pt scheduled for appointment today at 1:15, will call back if needs later time.

## 2010-11-07 NOTE — Telephone Encounter (Signed)
OK 

## 2010-11-07 NOTE — Patient Instructions (Signed)
Please make a f/u appointment in 4-6 weeks. Please try to take tylenol, aspirin or advil for your headache and if it doesn't work, take a pill of Vicodin. I will call you if the test today is abnormal. We will  make an appointment with your eye doctor for  Headache and follow with cancer doctor.

## 2010-11-07 NOTE — Telephone Encounter (Signed)
Pt called with c/o sharp shooting pains on left side of head.  Onset at 0300 today.  Pain comes and goes but last for a few seconds. Denies vision problems, weakness or numbness. Denies any falls Has migraines in past but not the shooting pain.  She is out pain meds. For headache. No positing helps.

## 2010-11-08 LAB — SEDIMENTATION RATE: Sed Rate: 34 mm/hr — ABNORMAL HIGH (ref 0–22)

## 2010-11-12 NOTE — Op Note (Signed)
NAMEKITTI, MCCLISH NO.:  [id number]   MEDICAL RECORD NO.:  [id number]          PATIENT TYPE:  AMB   LOCATION:  SDS                          FACILITY:  [facility]   PHYSICIAN:  Myrtie Neither, MD      DATE OF BIRTH:  04-22-1946   DATE OF PROCEDURE:  06/14/2008  DATE OF DISCHARGE:                               OPERATIVE REPORT   PREOPERATIVE DIAGNOSIS:  Osteophyte deformed left fourth toenail,  painful left fourth toe.   POSTOPERATIVE DIAGNOSIS:  Osteophyte deformed left fourth toenail,  painful left fourth toe.   ANESTHESIA:  General.   PROCEDURE:  Partial amputation, left fourth toe.   PROCEDURE IN DETAIL:  The patient was taken to the operating room.  After giving adequate preop medications, given general anesthesia and  intubated.  Tourniquet and Bovie were used for hemostasis.  Curvilinear  incision was made about the distal end of the fourth toe going through  the skin and subcutaneous tissue.  Osteophyte was identified and with  sharp and blunt dissection, amputation and resection of the toe was done  all the way back to the proximal phalanx.  Complete dorsal resection of  the toenail itself and distal end of the toe was completely resected.  Irrigation was then done.  Wound closure was then done with use of the 4-  0 nylon.  Compressive dressing was applied.  The patient tolerated the  procedure quite well and went to recovery room in a stable and  satisfactory condition.  The patient is being fitted for fracture shoe  and being discharged with Percocet 5 mg one q.4 p.r.n., ice packs,  elevation, and return to the office in 1 week.  The patient is being  discharged in stable and satisfactory condition.      Myrtie Neither, MD  Electronically Signed     AC/MEDQ  D:  06/14/2008  T:  06/14/2008  Job:  [id number]

## 2010-11-12 NOTE — Op Note (Signed)
[name] [name], [name] [name]               ACCOUNT NO.:  [id number]   MEDICAL RECORD NO.:  [id number]          PATIENT TYPE:  AMB   LOCATION:  SDS                          FACILITY:  [facility]   PHYSICIAN:  Myrtie Neither, MD      DATE OF BIRTH:  1945/11/22   DATE OF PROCEDURE:  12/27/2008  DATE OF DISCHARGE:  12/27/2008                               OPERATIVE REPORT   PREOPERATIVE DIAGNOSES:  1. Bunionette, right foot.  2. Dorsal callosity with osteophyte, proximal phalanx, right fifth      toe.   ANESTHESIA:  General.   PROCEDURES:  1. Excision of bunionette, right fifth metatarsal.  2. Excision of proximal phalangectomy, right fifth toe.   The patient was taken to the operating room after given adequate preop  medications, given general anesthesia and intubated.  Right foot was  prepped with DuraPrep and draped in a sterile manner.  Bovie used for  hemostasis.  Longitudinal incision was made at the dorsal aspect of the  fifth toe extended down to the distal third of the fifth metatarsal.  Sharp, blunt dissection was made down to the bunionette, Bovie used for  hemostasis.  The use of a bone cutter, bunionectomy was done edges from  the fifth metatarsal head.  The edges were smoothed.  Next, sharp and  blunt dissection was made over the proximal phalanx.  A complete  phalangectomy was done.  Osteophyte of the phalanx was identified.  Hemostasis was obtained.  Irrigation was done.  Wound closure was then  done with 3-0 nylon.  Compressive dressings were applied.  Fracture shoe  was applied.  The patient tolerated the procedure quite well and went to  the recovery room in stable and satisfactory condition.  The patient  being discharged home on Percocet one q.6 h. p.r.n. for pain, ice packs,  elevation, use of fracture shoe, and to return to office in 1 week.  The  patient being discharged in stable and satisfactory condition.      Myrtie Neither, MD  Electronically Signed     AC/MEDQ  D:  12/27/2008  T:  12/28/2008  Job:  [id number]

## 2010-11-12 NOTE — Discharge Summary (Signed)
NAMEAUTUM, [name] [name] NO.:  [id number]   MEDICAL RECORD NO.:  [id number]          PATIENT TYPE:  INP   LOCATION:  3009                         FACILITY:  [facility]   PHYSICIAN:  Fransisco Hertz, M.D.  DATE OF BIRTH:  10-May-1946   DATE OF ADMISSION:  10/18/2008  DATE OF DISCHARGE:  10/20/2008                               DISCHARGE SUMMARY   DISCHARGE DIAGNOSES:  1. 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  2. Migraines.  3. Hypertension.  4. History of breast cancer followed by Dr. Truett Perna with Oncology.  5. 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  6. Asthma.  7. Tobacco abuse.   DISPOSITION AND FOLLOWUP:  The patient is to follow up in the [facility] at date to be determined at time of  dictation.  She will not be sent home on any new medications at time of  discharge.  Please assess whether the patient has had recurrence of her  symptoms.  The patient will also follow up with Dr. Truett Perna with  Oncology at time to be determined at time of dictation.   DISCHARGE MEDICATIONS:  1. Albuterol 90 mcg as directed.  2. Flovent 44 mcg as directed.  3. Azmacort 75 mcg until 2 puffs b.i.d.  4. Singulair 10 mg p.o. daily.  5. Neurontin 300 mg p.o. nightly.  6. Femara 2.5 mg p.o. daily.  7. Valproic acid 250 mg take 2 capsules by mouth twice daily.  8. Ibuprofen 800 mg p.o. t.i.d. p.r.n. for headache.  9. Lasix 40 mg p.o. daily.   PROCEDURES PERFORMED:  1. Chest x-ray on October 18, 2008, shows mediastinal nodal metastases,      reidentified, otherwise no acute cardiopulmonary abnormality.  2. CT scan of the head and neck on October 18, 2008, shows no acute      intracranial abnormality, stable, advanced for age white matter      disease.  Bubbly opacity in the left sphenoid sinus, consider acute      sinusitis.  3. Fluoroscopic-guided lumbar  puncture technically successful      diagnostic lumbar puncture L3-L4 with clear colorless fluid under      normal pressure.   CONSULTATIONS:  None.   ADMITTING HISTORY AND PHYSICAL:  The patient is a 65 year old African  American female with history of breast cancer, HSV meningitis in 2006,  and history of migraine headaches, who comes to the [facility] Emergency  Department this evening complaining of 10/10 headache.  Headache is  associated with photophobia, diplopia, diffuse pain, pounding worse in  her than usual 10/10 acute onset without aura.  She is febrile at 101  degrees in the emergency department.  She is not confused.  Denies neck  stiffness but is mildly lethargic.  There is no known immunodeficiency  state, but she currently is being evaluated by Oncology for recurrence  of breast cancer and new metastatic skin lesions and bony lesion.  She  denies any chest pain, fever, chills, nausea, vomiting, diarrhea,  or  acute illness.  No sick contacts.  The patient also denies travel or  outdoor activities.  She does complain of increased urinary frequency  but no dysuria.   PHYSICAL EXAMINATION:  VITAL SIGNS:  Temperature 101.1, blood pressure  172/92, pulse 114, respirations 20, saturating 98% on room air.  GENERAL:  The patient is an ill-appearing female lying flat on the  stretcher in the emergency department with the light off and head cover  by a blanket.  HEENT:  Head atraumatic, normocephalic.  Eyes, conjunctive injected, but  pupils equal, round, and reactive to light and accommodation.  Extraocular movements intact.  Ears, nose, and throat, moist mucous  membranes with poor dentition.  Tongue is normal.  Clear oropharynx.  NECK:  Supple.  No masses.  Trachea midline.  Neck is negative for  nuchal rigidity.  RESPIRATION:  Clear to auscultation bilaterally.  Chest wall is positive  for left mastectomy with ulceration of left breast nodule with central  ulceration.   CARDIOVASCULAR:  Regular rate and rhythm.  No murmurs, rubs, or gallops.  ABDOMEN:  Soft, nontender, nondistended.  Positive bowel signs.  LYMPHATIC:  No lymphadenopathy.  SKIN:  No rashes.  NEUROLOGIC:  Alert and oriented x3.  Cranial nerves II through XII  grossly intact.  No focal findings on neurologic examination.  PSYCHIATRIC:  Slightly lethargic but appropriate and uncomfortable  appearing.   LABORATORY DATA:  White blood cell count 7.5, hemoglobin 14.0, platelets  223.  Sodium 137, potassium 3.8, chloride 108, bicarb 22, BUN 9,  creatinine 0.94, glucose 146.  Bili 0.6, alk phos 92, AST 15, ALT 15,  total protein 7.3, albumin 3.6, calcium 9.3.  UA is negative, nitrites,  moderate leukocytes, 11-20 white blood cells.  CSF glucose is normal at  65.  CSF protein is elevated at 69.  CSF Gram stain is no organisms seen  which is final.  Tube for lumbar puncture shows clear colorless fluid  with a white blood cell count of 299, RBC count 12, CSF segmented  neutrophils 16, lymphocytes 39, monocytes 45, eosinophils 0.  RPR is  nonreactive.  HIV antibody is nonreactive, and valproic acid level is  less than 10.  VDRL is nonreactive.   HOSPITAL COURSE:  1. Aseptic meningitis.  The patient was admitted [facility] Teaching      Service for evaluation of her acute-onset headache.  She obtained a      lumbar puncture via Interventional Radiology on first night of      hospitalization which was concerning for her physical exam signs      and symptoms.  The CSF analysis was most likely indicative of      aseptic meningitis.  The patient does have a history of meningitis      back in 2006, but she was CSF PCR positive for HSV but did not have      any acute genital herpes outbreaks prior to that or since that      time; however, to cover her broadly she was started on acyclovir      IV, vancomycin IV, cefoxitin IV, and also given a dose of      dexamethasone.  Her CSF was sent for PCR analysis,  and blood      cultures were also obtained.  During her first night of      hospitalization, the patient's headache slowly resolved.  Then      actually on the second day of hospitalization, she  stated she felt      well enough to go home.  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  So on the second day of hospitalization, her      antibiotics were discontinued, but she continued to receive      acyclovir IV throughout her second day of hospitalization.  On the      last of hospitalization, the patient was feeling quite well.  She      did not have any symptoms of headache.  Her vital signs were      stable, and she was also eager to be discharged from the hospital.      After speaking with the patient at length about her diagnoses, the      primary team felt that she would not need to be discharged on      acyclovir because of the recurrent nature of Mollaret meningitis.      The patient could possibly have this in the future, but there is no      way to predict whether she will or not and also there is no current      evidence that suggest she should be treated prophylactically with      acyclovir.  We asked the primary care physician and the [facility]      outpatient clinic followup with her PCR data from the CSF in order      to assess whether this is a recurrence of HSV.  The patient has      been advised to return to the emergency department if she has any      recurrence of her signs and symptoms.  2. Migraine headache.  The patient takes valproic acid for migraine      prophylaxis at home.  She is to continue on her valproic acid      throughout her hospitalization.  3. Urinary tract infection.  The patient had an uncomplicated pyuria      of white blood  cell 11-20 on micro studies.  She did describe some      urinary frequency.  She was treated with broad-spectrum IV      antibiotics for 2 days which would adequately cover her urinary      pathogens as she was asymptomatic at time of discharge, and she      will not be given any antibiotics at time of discharge in order to      treat her asymptomatic UTI.  4. Hypertension.  The patient was continued on her dose of Lasix, and      she will be discharged back on the same dose.  No adjustment to her      medication was made throughout her hospitalization.  5. History of breast cancer.  The patient has a recent recurrence.      She has been continuing Femara treatment for metastatic disease      control.  She had an ulceration on her left breast which was      consistent with local skin mets, but no brain or spinal mets have      been seen on CT.  She will continue to follow up with her  oncologist, Dr. Truett Perna.  6. Asthma.  She was continued on her home asthma medications      throughout her hospitalization.  She did not endorse any episodes      of shortness of breath throughout.   DISCHARGE VITAL SIGNS:  Temperature 98.8, blood pressure 129/74, pulse  67, respirations 18, saturating 96% on room air.   DISCHARGE LABORATORY DATA:  Sodium 136, potassium 3.6, chloride 105,  bicarb 24, BUN 12, creatinine 0.92, glucose 96.  White blood cell count  6.8, hemoglobin 11.7, platelet count 181.   The patient was discharged home in stable and improved condition.      Genia Del, MD  Electronically Signed      Fransisco Hertz, M.D.  Electronically Signed    ZF/MEDQ  D:  10/20/2008  T:  10/21/2008  Job:  [id number]

## 2010-11-12 NOTE — Discharge Summary (Signed)
NAMEZAURIA, DOMBEK               ACCOUNT NO.:  [id number]   MEDICAL RECORD NO.:  [id number]          PATIENT TYPE:  INP   LOCATION:  3009                         FACILITY:  [facility]   PHYSICIAN:  Genia Del, MD         DATE OF BIRTH:  [birth year]   DATE OF ADMISSION:  10/18/2008  DATE OF DISCHARGE:  10/20/2008                               DISCHARGE SUMMARY   DISCHARGE DIAGNOSES:  1.    INCOMPLETE DICTATION.      Genia Del, MD     ZF/MEDQ  D:  10/20/2008  T:  10/21/2008  Job:  [id number]

## 2010-11-13 ENCOUNTER — Encounter: Payer: Self-pay | Admitting: Internal Medicine

## 2010-11-13 ENCOUNTER — Ambulatory Visit (INDEPENDENT_AMBULATORY_CARE_PROVIDER_SITE_OTHER): Payer: PRIVATE HEALTH INSURANCE | Admitting: Internal Medicine

## 2010-11-13 VITALS — BP 153/83 | HR 89 | Temp 100.0°F | Ht 68.0 in | Wt 209.0 lb

## 2010-11-13 DIAGNOSIS — R51 Headache: Secondary | ICD-10-CM

## 2010-11-13 DIAGNOSIS — G43909 Migraine, unspecified, not intractable, without status migrainosus: Secondary | ICD-10-CM

## 2010-11-13 MED ORDER — HYDROCODONE-ACETAMINOPHEN 5-325 MG PO TABS
1.0000 | ORAL_TABLET | Freq: Two times a day (BID) | ORAL | Status: AC | PRN
Start: 2010-11-13 — End: 2010-11-23

## 2010-11-13 MED ORDER — KETOROLAC TROMETHAMINE 60 MG/2ML IM SOLN
60.0000 mg | Freq: Once | INTRAMUSCULAR | Status: AC
  Administered 2010-11-13: 60 mg via INTRAMUSCULAR

## 2010-11-13 NOTE — Progress Notes (Signed)
  Subjective:    Patient ID: [name] [name], female    DOB: 06-08-1946, 65 y.o.   MRN: [id number]  HPI Was here to follow up on sx's, and notes she is having ongoing and severe episodes of Migraines- No associated N/V, no clear triggers she can identify. History of migraine headaches since age 50.  She has had bouts of severe headaches similar to this in the past, although not accompanied by falls or mild ataxia.  No falls since last month, and none since those reported previously.   Review of Systems     Objective:   Physical Exam Has mild ataxia bilateral upper extremities-exam complicated by a mild resting tremor   Exam otherwise unchanged    Assessment & Plan:   Migraine Headaches, severe, with flare in symptoms Unexplained Ataxia  Cranial CT was obtained-uncontrasted-to rule out intracranial bleed which was negative for that.  Given risks of other etiology-focal lesions, including recurrent and metastatic breast CA-feel that she needs a contrasted MRI done promtly to rule these out. Also can not rule out microvascular lesions, missed on non-contrasted CCT that could cause weakness and ataxia (PICA stroke)  Labs reviewed and do not point to any etiology-very mild elevation in ESR non-specific but too low to suggest a vasculitis.  Obtain MRI and follow up with me after that done. Will also as a precaution have her see the ophthamologist as scheduled.  Toradol 60 mg IM for migraine HA

## 2010-11-15 ENCOUNTER — Telehealth: Payer: Self-pay | Admitting: *Deleted

## 2010-11-15 NOTE — Discharge Summary (Signed)
NAME:  [name] [name], [name] [name]                         ACCOUNT NO.:  [id number]   MEDICAL RECORD NO.:  [id number]                   PATIENT TYPE:  INP   LOCATION:  0259                                 FACILITY:  [facility]   PHYSICIAN:  Lowell C. Catha Gosselin, M.D.               DATE OF BIRTH:  [birth year]   DATE OF ADMISSION:  06/21/2002  DATE OF DISCHARGE:  06/25/2002                                 DISCHARGE SUMMARY   DISCHARGE DIAGNOSES:  1. 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  2. Severe pain secondary to involvement of the spine now improved on     steroids, pain medication, and radiation.  3. Generalized debility secondary to #1 and #2.   SUMMARY:  The patient is a 65 year old female patient that I have been  associated with since October 1996.  She was found to have left breast  cancer then, had a left mastectomy in October of 1996.  She has a 10 cm  primary, 3 axillary nodes positive, ER/PR positive, functionally post  menopausal with a stage IIIA, T3 M1 lesion.  We recommended staging surgery  and chemotherapy but she did not return the followup.  She saw me in January  of 1997 but refused chemotherapy, radiation, and tamoxifen.  She had chest  wall lesions throughout December 2001.  However, she did not return to  follow up for the follow up of acute treatment.   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  CT scan  then showed recurrent disease in the left surgical breast site, mediastinal  adenopathy, axillary adenopathy, two liver lesions.  She was given an  appointment to follow up with me for metastatic disease but did not return.   I received a call on the day of admission from Dr. Montez Morita in orthopedics.  She was having severe pain in her back and really was unable to do anything.  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   Admission exam was remarkable at that time for the fact that she could  straight leg raise.  She had 4/5 dorsal and plantar flexion of both ankles.  The main problem was the pain.  Indeed, in one area of her back she had so  much muscle spasm that it actually felt like a knot which was relieved with  muscle relaxants.   HOSPITAL COURSE:  The patient was made a No Code Blue on our discussion and  a hospice referral was made.  She understands this is an  incurable disease  process, that all treatment is palliative.  We had radiation therapy see her  urgently and start radiation therapy  to her spinal process.  By the time of  discharge on June 24, 2002, she has had three fractions out of 13  planned.  This will be resumed again on Monday, December 29th at 11 a.m.   For her systemic disease, we started her on Arimidex 1 mg p.o. q.d. since  she really is not symptomatic other than her back metastasis.  She has been  reluctant to take chemotherapy all along, i.e., poor compliance as noted  above.   She was started on 1 mg of Arimidex a day and tolerated it well.  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   In the meantime, her pain control is quite good with the patch and p.r.n.'s.  As such, hospice saw the patient and she is going to be getting her  medications through them.   She is ambulating well without incident.   DISCHARGE MEDICATIONS:  She is subsequently to be discharged with:  1. Senokot-S two tablets p.o. b.i.d. and her bowel regimen is working.  2. Duragesic patches 50 mcg each, one patch on the skin to change every 48     hours.  3. Valium 5 mg p.o. q.8 h. for muscle spasm.  4. Decadron 4 mg tablets, two tablets by mouth three times a day to  take     with food.  5. Morphine sulfate immediate release 30 mg tablets, one tablet by mouth     every four hours as needed for pain.  6. Pepcid 40 mg p.o. q.h.s.  7. Arimidex 1 mg p.o. q.d.   WOUND CARE:  She does have some areas of tumor that have caused open sores  on her left chest wall and these will be treated just with 4x4 daily  dressing changes and her hormonal therapy.   DISCHARGE INSTRUCTIONS:  She will call [phone] for problems or questions.   FOLLOW UP:  Appointment will be made with Jillyn Hidden B. Truett Perna, M.D. for July 15, 2002 at 2 p.m. to see how her breast cancer pills are doing.   CONDITION ON DISCHARGE:  At the time of discharge her overall status is  improved.   PROGNOSIS:  Guarded.   ACTIVITY AND DIET:  As tolerated.                                               Lowell C. Catha Gosselin, M.D.    LCS/MEDQ  D:  06/25/2002  T:  06/25/2002  Job:  [id number]   cc:   Jillyn Hidden B. Truett Perna, M.D.  [address] [facility]  [place]  [place]  [address]  Fax: [phone]   Billie Lade, M.D.  [address] [facility]  [place]  [place]  [address]  Fax: [phone]   Leonie Man, M.D.  [address]  [place]  [place] [address]  Fax: [phone]   P. Liliane Bade, M.D.  [address]  [place]  [place] [address]  Fax: [phone]   Ileana Roup, M.D.  [address] [place] [address]  Fax: [phone]   Dr. Montez Morita in Orthopedics

## 2010-11-15 NOTE — H&P (Signed)
NAME:  [name] [name], [name] [name]                         ACCOUNT NO.:  [id number]   MEDICAL RECORD NO.:  [id number]                   PATIENT TYPE:  INP   LOCATION:  0101                                 FACILITY:  [facility]   PHYSICIAN:  Lowell C. Catha Gosselin, M.D.               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   PAST MEDICAL HISTORY:  Positive October 1996 showed patient having a biopsy  in the left breast showing breast cancer.  Underwent a left mastectomy and  axillary dissection by Dr. 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  ERPR positive functionally postmenopausal stage IIIA, T3 N1  lesion.  We recommended staging studies and chemotherapy at the end but she  did not return for follow-up with me.  She saw me then again in the office  in January 1997, but again refused chemotherapy, radiation, and Tamoxifen.   She came back again to see me December 2001 with chest wall lesions and was  referred back to Dr. Lurene Shadow.  She had been found to have a 4-5 cm fixed  right axillary area of adenopathy and a large area of chest wall recurrence.  Despite plans for a biopsy and further work-up, she did not follow up.   She was admitted to the teaching service October 19, 2001 to October 24, 2001  and was anticoagulated for a left subclavian deep vein thrombosis.  CT  imaging then showed a local recurrence in her left surgical breast site,  mediastinal adenopathy, right axillary adenopathy, two lesions in the  inferior aspect of the right lobe of the liver likely metastasis,  nonspecific lesion in the upper pole of the left kidney, and fullness of the  right  adrenal gland.  She was given an appointment to follow up with me Nov 05, 2001, but was a no-show and never returned to the clinic.  I received a  call today from Dr. Montez Morita in orthopedics.  The patient had been seen by him  with a three month history of back pain.  CT imaging had been done which  questioned a paraspinous mass which had now been confirmed on MRI.  There  was some compression of the spinal cord.  I asked Dr. Montez Morita to have the  patient to come to the [facility] Emergency Room immediately and there she  was found to be in excruciating pain even to touch.  There was no bowel or  bladder incontinence.  She was able to straight leg raise but it was quite  painful.  Subsequently, she was  admitted for pain control, IV steroids, and  radiation.   PAST MEDICAL HISTORY:  As above.  She has had a hysterectomy and  oophorectomy.  She does smoke.   ALLERGIES:  None.   SOCIAL HISTORY:  Two children and a sister who look in on her.  She is a  smoker.  No regular job.   REVIEW OF SYSTEMS:  As above.   PHYSICAL EXAMINATION:  HEENT:  Oral mucosa is clear.  NECK:  No cervical, supraclavicular adenopathy I could feel today.  I did  not get a good feel of her axilla.  There seemed to be a tender area in the  left but she was guarding that area and really would not let me examine.  HEART:  S1 grade and S2 without rubs or gallops.  LUNGS:  Decreased breath sounds bilaterally.  ABDOMEN:  Tender over the liver area.  She is obese and it is difficult to  feel a liver edge.  This is probably from metastatic disease.  I did not  feel any lower abdominal masses.  Bowel sounds are active.  No inguinal  adenopathy.  EXTREMITIES:  No ankle edema noted.  What I did note was that she was able  to straight leg raise.  She had good dorsi and plantar flexion bilaterally  4/5 bilaterally.  She has hyperesthetic over her right upper thoracic spine  to the point you can barely touch the area.  There is  some fullness on the  right that looks to be a palpable paraspinous mass.   LABORATORY STUDIES:  Pending.   ASSESSMENT:  Neglected metastatic breast cancer.  The patient has repeatedly  refused any intervention of her first locally advanced and then over time  increasingly metastatic breast cancer.  However, she agrees to come in now  and get palliative treatment for a paraspinous mass which otherwise is going  to cause paralysis.   PLAN:  Will give IV Decadron and IV pain medication, IV Valium.  I have  reviewed the MRI films which came over kindly from Dr. Montez Morita with Dr.  Roselind Messier.  He will start the patient on treatment tonight.  The patient is  agreeable.  Overall her prognosis, unfortunately, is quite poor given her  widely metastatic disease.                                               Lowell C. Catha Gosselin, M.D.    LCS/MEDQ  D:  06/21/2002  T:  06/21/2002  Job:  [id number]   cc:   Jillyn Hidden B. Truett Perna, M.D.  [address] [facility]  [place]  [place]  [address]  Fax: [phone]   Ileana Roup, M.D.  [address] [place] [address]  Fax: [phone]   P. Liliane Bade, M.D.  [address]  [place]  [place] [address]  Fax: [phone]   Leonie Man, M.D.  [address], [address]  [place] [employer]  [place] [address]  Fax: [phone]   Billie Lade, M.D.  [address] - [facility]  [place]  [place]  [address]  Fax: [phone]   Montez Morita, M.D.

## 2010-11-15 NOTE — Op Note (Signed)
[name] [name], LINE NO.:  [id number]   MEDICAL RECORD NO.:  [id number]          PATIENT TYPE:  AMB   LOCATION:  SDS                          FACILITY:  [facility]   PHYSICIAN:  Myrtie Neither, MD      DATE OF BIRTH:  Jun 02, 1946   DATE OF PROCEDURE:  08/27/2005  DATE OF DISCHARGE:                                 OPERATIVE REPORT   PREOPERATIVE DIAGNOSIS:  Osteophyte neuroma, left fourth toe.   POSTOPERATIVE DIAGNOSIS:  Osteophyte neuroma, left fourth toe with deformed  nail bed.   OPERATION PERFORMED:  Complete nail excision, excision of neuroma and  phalangectomy, left fourth toe.   SURGEON:  Myrtie Neither, MD   ANESTHESIA:  General.   DESCRIPTION OF PROCEDURE:  The patient was taken to the operating room after  being given adequate preop medication and given general anesthesia and  intubated.  The left foot was prepped with DuraPrep and draped in sterile  manner.  The nail bed was completely excised.  Excision of the neuroma along  the lateral border of the toe was then done and phalangectomy of the middle  phalanx was done.  Copious irrigation was done.  Wound closure was done with  4-0 nylon.  Bulky compressive dressing was applied.  8 mL of 0.25% plain  Marcaine was injected into the area.  The patient tolerated the procedure  well, went to recovery room in stable and satisfactory condition.  The  patient is being discharged with a fracture shoe, Percocet 10/650 one 1every  four hours as needed for pain, elevation, ice packs, return to the office in  one week.  Patient is being discharged in stable and satisfactory condition.      Myrtie Neither, MD  Electronically Signed     AC/MEDQ  D:  08/27/2005  T:  08/27/2005  Job:  [id number]

## 2010-11-15 NOTE — Op Note (Signed)
NAMESHAMRA, BRADEEN NO.:  [id number]   MEDICAL RECORD NO.:  [id number]          PATIENT TYPE:  AMB   LOCATION:  DAY                          FACILITY:  [facility]   PHYSICIAN:  Myrtie Neither, MD      DATE OF BIRTH:  Jan 24, 1946   DATE OF PROCEDURE:  02/26/2005  DATE OF DISCHARGE:                                 OPERATIVE REPORT   PREOPERATIVE DIAGNOSES:  1.  Impingement syndrome, left shoulder.  2.  Rotator cuff tear, left shoulder.   POSTOPERATIVE DIAGNOSES:  1.  Impingement syndrome, left shoulder with complete rotator cuff tear,      left shoulder.  2.  Loose body, left shoulder.   PROCEDURE:  1.  Arthroscopic acromioplasty and removal of loose body and synovectomy.  2.  Mini-incision rotator cuff repair using suture anchors.   The patient was taken to the operating room after being given adequate  preoperative medication, given general anesthesia, intubated.  The patient  was placed in the barber chair position.  The left shoulder was prepped and  draped in a sterile manner.  A 1/2 inch puncture wound made along the  anterior and posterior aspects of the shoulder.  Inflow of water was  anteriorly.  The arthroscope was placed posteriorly.  A lateral incision  made __________ shaver.  Identification of the rotator cuff tear was  visible, loose body was also identified which was removed with a synovial  shaver.  Complete synovectomy was done.  Next, acromioplasty was done, after  adequate debridement and decompression of the joint.  Next, using the same  previous surgical scar a shoulder strap type incision was made and then a  vertical incision made into the deltoid allowing visualization of the  disrupted rotator cuff.  Cuff tendon had thickened fibrillated edges.  These  were sharply debrided.  A trough was made at the greater tuberosity with the  use of a bur.  Drill holes were placed down distally to the trough area.  Three heavy Ticron sutures were used  pulling the rotator cuff down.  Two  Mitek anchors were used and the cuff tendon was sutured down.  Other fascial  tissue was sutured down over the repair with the use of Ticron suture.  Next, rotator cuff itself was approximated with the use of Ticron and 0  Vicryl.  Subcutaneous was approximated with 2-0 Vicryl.  Skin staples were  used for skin closure.  Compressive dressing was applied.  Marcaine 0.5%  with epinephrine 20 cc was injected into the joint.  The patient tolerated  the procedure quite well in recovery room in stable and satisfactory  condition in an immobilizing sling.   The patient will be kept in 23-hour observation for pain control and to be  discharged on Percocet two q.4h. p.r.n. for pain, ice packs, and continue  with the use of immobilizing sling.           ______________________________  Myrtie Neither, MD     AC/MEDQ  D:  02/26/2005  T:  02/26/2005  Job:  [id number]

## 2010-11-15 NOTE — Telephone Encounter (Signed)
Pt calls to ask about MRI, message and ph# given to gladysh.

## 2010-11-15 NOTE — Discharge Summary (Signed)
[place]. [facility]  Patient:    [name] [name], [name] [name] Visit Number: [id number] MRN: [id number]          Service Type: MED Location: [phone] Attending Physician:  Farley Ly Dictated by:   Thayer Headings, M.D. Admit Date:  10/19/2001 Discharge Date: 10/24/2001   CC:         Lowell C. Catha Gosselin, M.D., [name]  [facility]  P. Bud Face, M.D.   Discharge Summary  DISCHARGE DIAGNOSES:  1. Deep vein thrombosis of the proximal internal jugular vein and subclavian     vein.  2. Asthma exacerbation.  3. Breast cancer with probable liver metastases and right axillary malignant     lymphadenopathy.  4. History of breast cancer, status post left mastectomy in 1995 by     Luisa Hart L. Lurene Shadow, M.D.  5. History of ulceration over the skin site in 2001.  6. History of adenopathy in the right axilla, worked up for metastases,     revealed two lesions in the right lobe of the liver, sclerotic lesions at     L2 through L5, but a normal bone scan in December 2001.  7. History of granuloma in the right heel.  8. Status post hysterectomy.  9. Status post left shoulder arthroscopy. 10. Status post left knee arthroscopy.  DISCHARGE MEDICATIONS:  1. Albuterol MDI two puffs q.i.d. and p.r.n.  2. Atrovent MDI two puffs q.i.d.  3. Coumadin 2.5 mg q.d.  4. Prednisone taper, start at 50 mg q.d. for two days.  DISPOSITION:  To home with family.  Will follow up at the [facility] clinic on April 28 with Dr. Erline Levine.  She will also follow up with Dr. Thea Silversmith in two weeks.  She is scheduled to see Dr. Catha Gosselin on May 2.  At that time she and Dr. Catha Gosselin will discuss treatment modalities for her recurrent disease.  She has expressed that she is not interested in chemotherapy or radiation therapy at this time but that she may be interested in tamoxifen or a related therapy.  [name] has seen Dr. Catha Gosselin in the  past.  PROCEDURES:  1. CT of the neck shows mucus retention cyst of her right maxillary sinus,     occluded left internal jugular vein, probable malignant adenopathy in the     left apex at the thoracic inlet extending through the chest wall.  2. CT scan of the chest with IV contrast shows recurrent breast cancer with     recurrent local disease in the left breast postsurgical site, mediastinal     adenopathy, internal mammary adenopathy, massive right axillary     adenopathy, and two lesions in the inferior aspect of the right lobe of     the liver, highly worrisome for metastatic disease, a nonspecific lesion     in the upper pole of the left kidney, and fullness of the right adrenal     region.  CONSULTANTS:  Denman George, M.D., of CVTS.  HISTORY OF PRESENT ILLNESS:  [name] is a 65 year old African-American female with a history of breast cancer who presented with shortness of breath and wheezing, subjective fevers, and productive cough over the last three days.  Her symptoms were precipitated by pollen, a known trigger for her asthma.  She ran out of her MDI but is using albuterol MDI without relief. She denies hemoptysis and pleuritic chest pain, but her chest was tight and painful with  cough.  Her throat is sore after coughing.  She knows of no sick contacts.  She was treated with albuterol nebulizers x2 in the ED without significant relief and is now very jittery and tired after the treatments. Her left arm is swollen and painful to touch with neck pain and swelling as well.  ALLERGIES:  No known drug allergies.  MEDICATIONS:  Vicodin, diazepam, albuterol MDI, possibly a steroid MDI.  FAMILY HISTORY:  No history of DVT in her family.  SOCIAL HISTORY:  Positive for tobacco one-half pack per day.  PHYSICAL EXAMINATION:  VITAL SIGNS:  Temperature 97.8, blood pressure 143/59, pulse 89, respirations 20, saturation 96% on room air.  GENERAL:  She is a middle-aged  woman who is dyspneic and tearful.  HEENT:  Normocephalic, atraumatic.  Pupils are equal, round and reactive to light.  Extraocular movements are intact.  Oropharynx is benign.  NECK:  Bilateral anterior cervical lymphadenopathy, left greater than right, with neck veins engorged on the left.  CHEST:  Lungs have positive rhonchi and rales with wheezing, decreased air movement.  CARDIAC:  Regular rate and rhythm, no murmurs, rubs, or gallops.  ABDOMEN:  Soft, nontender, nondistended.  Normoactive bowel sounds.  EXTREMITIES:  Left arm is swollen, left arm greater than right arm.  Tender to palpation in the axilla and deltoid and biceps region.  Trace pitting edema bilaterally in the lower extremities.  RECTAL:  No masses, guaiac is negative.  LABORATORY DATA:  Chest x-ray shows mild cardiomegaly, positive interstitial markings.  EKG is normal sinus rhythm with occasional PVCs, rate of 85, T-wave inversion in V1 through V6 and I through aVL, Q-waves in II, III, and aVL.  ABG on room air, pH 7.43, PCO2 33, PO2 72.  WBC 7.7, hemoglobin 12.4, platelets 238.  Sodium 139, potassium 3.1, chloride 109, CO2 23, BUN 15, creatinine 0.8, glucose 123.  HOSPITAL COURSE:  #1 -  LEFT SUBCLAVIAN DEEP VEIN THROMBOSIS:  The cause of [name] DVT is likely from her cancer or from previous vascular damage done from her surgery in the past or radiation.  For her DVT, she was started on a heparin drip.  She was also started on Coumadin therapy to be continued as an outpatient.  CVTS was consulted, and they said she was not a lytic candidate.  [name] reached a therapeutic INR for two consecutive days and was discharged after this.  She will be followed closely in the [facility] clinic.  She was to have her INR rechecked the day after discharge as her outgoing INR was 3.3.  Her Coumadin dose will be titrated in the [facility] Cou[facility] clinic.  #2 -  ASTHMA EXACERBATION:  [name]  was started on a steroid taper for her asthma and restarted on her bronchodilator and Atrovent.  Her asthma exacerbation quickly improved on these medications.  She will complete her steroid taper as an outpatient.  She was also instructed to use her inhalers every day to avoid this happening in the future.  She may need to be started on a steroid inhaler in the future.  #3 -  BREAST CANCER:  Ms. Angus in the past had made up her mind that she did not want any more treatment for her breast cancer and that she was just going to see what course it would take.  During this hospitalization this decision was discussed with her, and she agreed to at least go to see a hematologist/oncologist to discuss  what treatment options are available to her.  She has many horror stories of people she knows who have gone through chemotherapy and radiation therapy in the past and from these stories, she has determined that she is not at all interested in these therapies.  However, she has not discussed other options such as tamoxifen, according to the patient herself.  Although per patient her cancer may be too far spread at this point to hope for a cure, our medical team thought it was in her best interest to be followed by an oncologist for the possibility of slowing down her cancer course of need for palliative treatments in the future.  Ms. Delahoz had seen Dr. Catha Gosselin in the past and said she liked him and expressed interest in visiting him again.  She will see Dr. Catha Gosselin at the end of this week.  I believe she understands her cancer is quite widespread at this point.  DISCHARGE LABORATORY DATA:  WBC 11.4, hemoglobin 11.6, platelets 274.  Sodium 140, potassium 3.9, chloride 108, CO2 24, glucose 237, BUN 18, creatinine 0.9, calcium 9.2. Dictated by:   Thayer Headings, M.D. Attending Physician:  Farley Ly DD:  10/28/01 TD:  10/28/01 Job: [id number]

## 2010-11-15 NOTE — H&P (Signed)
[place]. [facility]  Patient:    [name] [name], [name] [name]                        MRN: [id number] Adm. Date:  [id number] Attending:  Wende Mott                         History and Physical  CHIEF COMPLAINT: Painful right foot.  HISTORY OF PRESENT ILLNESS: This patient is a 65 year old female who has been followed in the office for a ganglion cyst on the dorsal aspect of the right foot.  The patient also had gotten a cut with glass on the right heel several weeks ago and thought she had gotten it all out but has had severe pain in the right foot with granuloma developed over the right heel.  The patient had some signs of infection earlier and was placed on antibiotics, which cleared the erythema from the area, but the pain and granuloma have progressively gotten worse.  The cyst on top of her foot has doubled in size.  PAST MEDICAL HISTORY:  1. Hysterectomy.  2. Left shoulder arthroscopy.  3. Back surgery.  4. Left mastectomy.  5. Cesarean section x 2.  6. Bilateral foot operation.  7. Bilateral tubal ligation.  8. Left knee arthroscopy.  ALLERGIES: No known drug allergies.  MEDICATIONS:  1. Inhalers for asthma.  2. Hydrocodone for pain.  FAMILY HISTORY: Noncontributory.  SOCIAL HISTORY: The patient smokes half a packs of cigarettes per day.  REVIEW OF SYSTEMS: The patient also had ulceration over her left breast where she had previous mastectomy.  No cardiac or respiratory symptoms.  No urinary or bowel symptoms.  PHYSICAL EXAMINATION:  VITAL SIGNS: Temperature 97.6 degrees, pulse 71, respirations 16, blood pressure 122/76.  Height 5 feet 8 inches.  Weight 225 pounds.  HEENT: Head normocephalic, atraumatic.  Conjunctivae clear.  NECK: Supple.  CHEST: Clear.  CARDIAC: S1 and S2, regular.  BREAST: Ulceration approximately the size of a five cent piece noted over the incision scar of her left mastectomy, with some very discolored tissue  noted. The patient had a small Band-Aid over the area.  There was also some erythema noted about the scar.  ABDOMEN: Soft, active bowel sounds.  EXTREMITIES: Right foot with large cystic lesion doubled in size, with apparent two lobules, markedly tender to palpation.  The right heel has large granuloma and is markedly tender to palpation over the medial aspect of the heel.  IMPRESSION:  1. Ganglion cyst of right foot.  2. Foreign body granuloma, right heel.  3. Ulceration of left chest wall status post mastectomy. DD:  04/28/00 TD:  04/28/00 Job: [id number]

## 2010-11-15 NOTE — Op Note (Signed)
[place]. [facility]  Patient:    [name] [name], [name] [name]                        MRN: [id number] Proc. Date: 04/28/00 Adm. Date:  [id number] Attending:  Wende Mott                           Operative Report  PREOPERATIVE DIAGNOSES:  1. Ganglion cyst of right foot.  2. Foreign body granuloma, right foot.  OPERATION/PROCEDURE:  1. Excision of ganglion cyst of right foot dorsally.  2. Excision of foreign body granuloma of right heel.  SURGEON: Kennieth Rad, M.D.  ANESTHESIA: General.  DESCRIPTION OF PROCEDURE: The patient was taken to the operating room after being given adequate preoperative medication and was given general anesthesia and intubated.  The right foot and lower leg were prepped with DuraPrep and draped in sterile manner.  A tourniquet and Bovie were used for hemostasis. The granulomatous wound over the left thoracic area was cleansed with Betadine scrub and Scarlet Red dressing applied to the area.  A right foot dorsum curvilinear incision was made over the dorsal aspect of the mid foot and carried down through the skin and subcutaneous tissue.  Sharp and blunt dissection was made around the cystic lesion, which was down over osteophyte over the mid foot of the tarsals.  Rongeurs were used to remove the osteophyte and the cyst was completely resected.  Copious irrigation was then done and wound closure done with 4-0 Nylon.  The medial aspect of the heel was then approached with an elliptical incision, resecting the entire granuloma in toto.  Irrigation was then done followed by use of 2-0 Nylon for skin closure. Compressive dressings were applied to both areas.  The patient tolerated the procedure quite well and was taken to the recovery room in stable and satisfactory condition.  The patient is being treated with ice packs, fracture shoe, and Percocet 1 q.4h p.r.n. for pain.  Ice packs and elevation at home. The patient is to return  to the office in one week.  The patient is being discharged in stable and satisfactory condition. DD:  04/28/00 TD:  04/28/00 Job: [id number]

## 2010-11-15 NOTE — H&P (Signed)
[name], [name] NO.:  [id number]   MEDICAL RECORD NO.:  [id number]          PATIENT TYPE:  OIB   LOCATION:  2899                         FACILITY:  [facility]   PHYSICIAN:  Myrtie Neither, MD      DATE OF BIRTH:  07-Jul-1945   DATE OF ADMISSION:  09/27/2004  DATE OF DISCHARGE:                                HISTORY & PHYSICAL   CHIEF COMPLAINT:  Painful left third and fourth toes.   HISTORY OF PRESENT ILLNESS:  This is a 65 year old who had been followed in  the office for hyperkeratotic, deformed, painful toenails involving the left  foot. The patient has had surgery for the right foot and other toes in the  past. The patient's nails have been trimmed but still having severe pain  from the deformity of the nails itself.   PAST MEDICAL HISTORY:  1.  Nail excision.  2.  C-sections x2.  3.  Tubal ligation.  4.  Hysterectomy.  5.  Left shoulder arthroscopy.  6.  Left knee arthroscopy.  7.  Granuloma right hand removed.  8.  Breast cancer with left mastectomy in 1995.  9.  History adenopathy in the right axilla.  10. Removal lymph nodes in left axilla.  11. History of discectomy.  12. History of phlebitis.  13. History of cardiac murmur.  14. Chronic bronchitis.  15. Migraine headaches.  16. Peptic ulcer disease.   ALLERGIES:  None known.   MEDICATIONS:  1.  Femara 2.5 mg daily.  2.  Mylanta.  3.  Advil.  4.  Albuterol inhaler.  5.  Atrovent inhaler.   The patient has also had chemo and radiotherapy.   SOCIAL HISTORY:  The patient denies use of alcohol, smokes less than a pack  of cigarettes per day.   FAMILY HISTORY:  Noncontributory.   REVIEW OF SYSTEMS:  Some shortness of breath from her bronchitis. No urinary  or bowel symptoms. Some GI symptoms from reflux.   PHYSICAL EXAMINATION:  VITAL SIGNS:  Temperature 99.1, pulse 64,  respirations 18, blood pressure 155/88. Height 5 feet 7.5 inches, weight  201.  HEENT:  Head:  Normocephalic. Eyes:  Conjunctivae and sclerae clear.  NECK:  Supple.  CHEST:  Clear.  CARDIAC:  S1, S2, regular.  EXTREMITIES:  Left foot with hyperkeratotic, tender, deformed third and  fourth toenails.   IMPRESSION:  Painful left toenails with hyperkeratosis and deformed toenails  left foot.   PLAN:  Radical nail excision, left third and fourth toenails.      AC/MEDQ  D:  09/27/2004  T:  09/27/2004  Job:  [id number]

## 2010-11-15 NOTE — Discharge Summary (Signed)
[name], [name] [name] NO.:  [id number]   MEDICAL RECORD NO.:  [id number]          PATIENT TYPE:  INP   LOCATION:  5018                         FACILITY:  [facility]   PHYSICIAN:  [name] [name], M.D.   DATE OF BIRTH:  May 27, 1946   DATE OF ADMISSION:  02/10/2005  DATE OF DISCHARGE:  02/14/2005                                 DISCHARGE SUMMARY   CONTINUITY DOCTOR:  [name] [name], M.D.   CONSULTANTS:  1.  [name] [name], M.D., infectious disease.  2.  [name] [name], M.D., oncology.   DISCHARGE DIAGNOSES:  1.  Herpes simplex meningitis.  2.  History of metastatic breast cancer.  3.  History of migraine headaches.   DISCHARGE MEDICATIONS:  1.  Femara 2.5 mg 1 tablet daily.  2.  Valtrex 500 mg 1 tablet b.i.d. x10 days.   DISPOSITION/FOLLOWUP:  The patient is discharged to home and feels that she  has returned to her baseline of health.  She no longer complains of  headache, nausea or vomiting.  She will be followed up in the continuity  clinic with Dr. Laveda [name] on August 29th at 3:00 p.m.  Dr. Truett [name] will  arrange for an oncologic followup of the patient.   PROCEDURES PERFORMED:  1.  Chest x-ray on August 15th:  Asymmetric elevation of left hemidiaphragm      with left base atelectasis.  2.  CT of the head without contrast:  Stable exam with no acute abnormality.  3.  Fluoroscopy-guided lumbar puncture.  Opening pressure 18 cm.  4.  MRI of the brain with and without contrast, August 15th.  No evidence      for metastatic disease.  Scattered punctate and subcortical      periventricular white matter.  Signal hyperintensities likely reflective      of chronic microvascular ischemia.   ADMITTING H&P:  Patient is a 65 year old African-American female with a  history of metastatic breast cancer who presented with an eight hour history  of headache accompanied by nausea, one episode of vomiting, fever, and  photophobia.  Two days prior to admission, the  patient was in her normal  state of health.  The headache was in the occipital region of the head with  radiation to the neck and was unrelieved by Advil Migraine.   ADMISSION LABS:  White cell count 7.2, hemoglobin 12.2, hematocrit 36.4,  platelets 219.  Normal urinalysis.  Sodium 142, potassium 4, chloride 107,  bicarb 26, BUN 8, creatinine 0.9, glucose 98, bilirubin 0.5, alkaline  phosphatase 88, AST 19, ALT 12, total protein 8, albumin 4.1, calcium 9.8,  INR 1,   HOSPITAL COURSE:  Problem 1:  Herpes simplex meningitis:  The patient was  empirically treated with ceftriaxone in the emergency room secondary to  concern for bacterial meningitis.  A lumbar puncture was notable for 490  white cells with a differential of 77% lymphocytes, 18% macrophages, and 5%  neutrophils.  Glucose 57, total protein 101.  Gram's stain with no organisms  present.  Review of the cytology slide with a pathologist revealed no  evidence for  carcinomatous meningitis.  Further studies performed on the CSF  revealed a Cryptococcal antigen that was negative and a herpes simplex virus  PCR that was positive and a VDRL that was negative.  The patient was  empirically treated with vancomycin, ceftriaxone, and ampicillin for  approximately 48 hours until the CSF culture had been read as no growth to  date for 48 hours.  At this time, on the 16th of August, the patient was  also empirically started on 900 mg acyclovir every 8 hours to treat  empirically for herpes meningitis until the PCR results were available.  After 48 hours of no growth, the three antibiotics were discontinued, and  the patient was kept overnight pending the results of the herpes PCR,  although the patient had dramatic improvement in health approximately 24  hours after beginning the three antibiotics and the acyclovir.  Notably, two  blood cultures, a urine culture, and a CSF culture all have no growth to  date with final culture results pending  at the time of discharge.  In  consultation with Dr. Roxan [name] of infectious disease, it was determined that  based upon the patient's clinical improvement and currently recommended  practices, that the patient be discharged on an oral regimen of acyclovir.  Patient is thus discharged on Valtrex 500 mg b.i.d. with the instructions to  return to seek medical care should her symptoms worsen.  Of note, the  patient denies any current or past symptoms consistent with a diagnosis of  genital herpes, type 2, which is often seen in patient's who have herpes  meningitis.  Problem 2:  History of metastatic breast cancer:  The patient was seen by  her primary oncologist, Dr. Truett [name], during her hospital stay.  He noted  that the patient has responded extremely well from all of her medication and  is considered to currently be in remission for her history of breast cancer.  He agreed that the clinical presentation of the patient was more consistent  with the meningitic picture and upon his recommendation, the cytology slide  was reviewed with the pathologist, which showed no evidence for  carcinomatous meningitis.  Patient was discharged on her admission  medication of Femara.  Problem 3:  History of migraines:  Patient reports approximately two  migraines a month, which she treats with over-the-counter Advil migraine  medicine.  The headache on presentation was different in nature and  intensity than her typical migraines, thus prompting her visit to the  emergency department.   DISCHARGE LABS:  Sodium 143, potassium 3.9, chloride 110, bicarb 28, BUN 11,  creatinine 0.8, glucose 100, calcium 8.7.  White blood cell count 5.1,  hemoglobin 12, platelets 170.      [name] [name], M.D.     TE/MEDQ  D:  02/14/2005  T:  02/14/2005  Job:  [id number]   cc:   [name] [name], M.D.  [address] [facility]  [place]  [place] [phone]  Fax: [phone]

## 2010-11-15 NOTE — Op Note (Signed)
[name] [name], [name] [name] NO.:  [id number]   MEDICAL RECORD NO.:  [id number]          PATIENT TYPE:  INP   LOCATION:  5040                         FACILITY:  [facility]   PHYSICIAN:  Myrtie Neither, MD      DATE OF BIRTH:  04/25/1946   DATE OF PROCEDURE:  06/11/2006  DATE OF DISCHARGE:                               OPERATIVE REPORT   PREOPERATIVE DIAGNOSIS:  Degenerative arthritis left knee.   POSTOPERATIVE DIAGNOSIS:  Degenerative arthritis left knee.   ANESTHESIA:  General.   PROCEDURE:  Left total knee arthroplasty, Biomet implant.   DESCRIPTION OF PROCEDURE:  The patient was taken to the operating room  after given adequate preop medication, general anesthesia and intubated.  The left knee was prepped with DuraPrep and draped sterilely.  Tourniquet, Bovie used for hemostasis.  An anterior midline incision was  made over the left knee going through the skin and subcutaneous tissue.  Sharp and blunt dissection was made both medially and laterally.  A  median paramedian capsular incision was extended from the tibial  tuberosity up to the quadriceps.  Patella was reflected laterally.  Osteophytes about the patella, femur and tibia were resected.  Soft  tissue resection was then done.  With the knee in the flexed position,  initial tibial cutting jig was put into place with 10 mm tibial plateau  surface resection.  Next, reaming down the femoral canal was done, and  the distal femoral cutting jig was put in place with 6 degrees of valgus  and appropriate cut was made.  Sizing of the femur was then done and the  femur was sized at 65 mm.  Appropriate cutting jig was put in place.  Anterior and posterior cuts and chamfer cuts were made.  Trial  components were put in place and we found we had a very good snug fit.  Attention was then turned to the tibial surface.  Tibial plateau guide  was put in place and best coverage was found to be 71 mm.  Alignment rod  was used,  and then appropriate tibial cut was made.  With the femoral  and tibial trial components put in place and 10 mm poly, good medial  line stability, full flexion, full extension is possible.  PCL was found  to be dysfunctional and appropriate cuts for PC substitution was made.  Posterior cruciate substitution of femoral compartment was hammered in  place and again found to fit very good and snug.  Trial components were  put in place with full flexion, full extension and good medial and  lateral stability, and no subluxation of the patella.  Attention was  then turned to the patella which was sized at medium size 34 mm and at  21 mm thickness.  The appropriate cutting jig was put in place and the  patella cut was made.  With all three trial components put in place,  there was full flexion, full extension, good medial and lateral  stability, and no subluxation of the patella.  Copious irrigation was  then done by removal of the trial implants was  done and copious  irrigation done.  Methylmethacrylate was mixed.  All three components  were cemented and excess methylmethacrylate was removed.  After the  cement had set quite well, a thorough inspection did not reveal any  loose poly.  Final trial poly 10 mm was snapped in place and locked.  Again, range of motion, full extension, full flexion, good medial and  lateral stability and no subluxation of the patella.  His tourniquet was  let down, hemostasis obtained with the Bovie, followed by wound closure,  0-Vicryl for the fascia, 2-  0 for the subcutaneous and skin staples for the skin.  Bulky compressive  dressing was applied and immobilizer applied.  The patient tolerated the  procedure quite well and went to the recovery room in stable and  satisfactory condition.      Myrtie Neither, MD  Electronically Signed     AC/MEDQ  D:  06/15/2006  T:  06/15/2006  Job:  [id number]

## 2010-11-15 NOTE — Op Note (Signed)
NAMEMADISON, DIRENZO NO.:  [id number]   MEDICAL RECORD NO.:  [id number]          PATIENT TYPE:  OIB   LOCATION:  2899                         FACILITY:  [facility]   PHYSICIAN:  Myrtie Neither, MD      DATE OF BIRTH:  03-07-1946   DATE OF PROCEDURE:  09/27/2004  DATE OF DISCHARGE:                                 OPERATIVE REPORT   PREOPERATIVE DIAGNOSIS:  Hyperkeratotic, painful, deformed nails left third  and fourth toes.   POSTOPERATIVE DIAGNOSIS:  Hyperkeratotic, painful, deformed nails left third  and fourth toes.   ANESTHESIA:  General.   PROCEDURE:  Radical nail excision left third and fourth toenails with nail  excision and distal phalangectomy third and fourth toes.   The patient was taken to the operating room after given adequate  preoperative medication, given general anesthesia and intubated. Left foot  was prepped with DuraPrep and draped in a sterile manner. Tourniquet used  for hemostasis. With the use of a Freer, the nails were removed. Next, with  a scalpel and curette the nailbed was completed resected, thereby exposing  the distal phalanx. The distal phalanx was then resected with the use of a  bone cutter. Irrigation was done. Wound closure was then done with 4-0  nylon. Next, the fourth toenail was resected with the Glorious Peach, followed by  excision of the nailbed with a scalpel and curette, and resection of the  distal phalanx with the bone cutter. Copious irrigation was then done. Wound  closure was then done with the 4-0 nylon. Compressive dressing was applied.  The patient tolerated the procedure quite well and went to recovery room in  stable and satisfactory condition. The patient is being discharged home on  fracture shoe, Percocet 5 mg one q.4h. p.r.n. for pain, ice packs,  elevation, and to return to the office in 1 week. The patient is being  discharged in stable and satisfactory condition.      AC/MEDQ  D:  09/27/2004  T:  09/27/2004   Job:  [id number]

## 2010-11-15 NOTE — H&P (Signed)
[name], [name] NO.:  [id number]   MEDICAL RECORD NO.:  [id number]          PATIENT TYPE:  AMB   LOCATION:  DAY                          FACILITY:  [facility]   PHYSICIAN:  Myrtie Neither, MD      DATE OF BIRTH:  1945/08/13   DATE OF ADMISSION:  02/26/2005  DATE OF DISCHARGE:                                HISTORY & PHYSICAL   CHIEF COMPLAINT:  Painful weak left shoulder.   HISTORY OF PRESENT ILLNESS:  This is a 65 year old female who had sustained  a fall and injury to her left shoulder approximately four weeks ago.  The  patient was found to have mild AC separation and rotator cuff tear of the  left shoulder.  The patient continued to have pain to the left shoulder,  inability to abduct the left shoulder.   PAST MEDICAL HISTORY:  1.  Breast cancer with left mastectomy in 1995.  2.  Skin ulcerations, 2001.  3.  Hysterectomy in the past.  4.  Left shoulder arthroscopy in the past.  5.  Left knee arthroscopy.  6.  C-section x2.  7.  Lumbar diskectomy.  8.  Bilateral foot surgery with nail excision.  9.  Bilateral tubal ligation.  10. Ganglion excision, right foot.  11. Granuloma excision with removal of foreign body, right heel.  12. The patient has a history of chemotherapy and radiotherapy for      metastatic breast cancer.  13. History of phlebitis.  14. Cardiac murmur.  15. Asthma.  16. Bronchitis.  17. Migraine headaches.  18. Recent herpes meningitis, two weeks ago.  19. Peptic ulcer disease.   REVIEW OF SYSTEMS:  Basically that of occasional migraine headaches, GI  irritation, gastritis, multiple joint pains of the knees and low back.   ALLERGIES:  None known to any medication.   SOCIAL HISTORY:  No history of use of alcohol.  The patient does smoke one  half pack per day __________  years.   FAMILY HISTORY:  Noncontributory.   MEDICATIONS:  1.  Femora 2.5 mg daily.  2.  Valtrex 500 mg b.i.d.  3.  Singulair 10 mg p.o. daily.  4.   Skelaxin 800 mg b.i.d.  5.  Percocet q.6h. p.r.n.  6.  Lodine 400 mg b.i.d.  7.  Albuterol inhaler p.r.n.  8.  Proventil inhaler p.r.n.   PHYSICAL EXAMINATION:  GENERAL:  Alert and oriented, no acute distress other  than anxiety from having to undergo surgery.  VITAL SIGNS:  Temperature 97.9, pulse 74, respirations 16, blood pressure  140/76, height 5 feet 7.5 inches, weight 198.  O2 saturation is 98%.  HEENT:  Head normocephalic.  Edentulous.  NECK:  Supple.  CHEST:  Left mastectomy scar.  LUNGS:  Clear.  CARDIAC:  S1 S2 regular.  EXTREMITIES:  Left shoulder:  Old surgical scar, shoulder strap over the  left shoulder.  Tender anterior and laterally.  Subacromial crepitus.  Limited range of motion with abduction, approximately 45 degrees.  Full  extension approximately 65-80 degrees.  Neurovascular status is intact.   MRI demonstrates complete rotator cuff  tear of the left shoulder.   IMPRESSION:  1.  Rotator cuff tear, left shoulder.  2.  Impingement, left shoulder.   PLAN:  1.  Arthroscopic acromioplasty.  2.  Mini-incision rotator cuff repair.      Myrtie Neither, MD  Electronically Signed     AC/MEDQ  D:  02/26/2005  T:  02/26/2005  Job:  [id number]

## 2010-11-15 NOTE — Discharge Summary (Signed)
[name] [name], [name] [name] NO.:  [id number]   MEDICAL RECORD NO.:  [id number]          PATIENT TYPE:  INP   LOCATION:  5040                         FACILITY:  [facility]   PHYSICIAN:  Myrtie Neither, MD      DATE OF BIRTH:  06-Feb-1946   DATE OF ADMISSION:  06/11/2006  DATE OF DISCHARGE:  06/15/2006                               DISCHARGE SUMMARY   ADMITTING DIAGNOSIS:  Degenerative arthropathy, left knee.   DISCHARGE DIAGNOSIS:  Degenerative arthropathy, left knee.   COMPLICATIONS:  None.   INFECTIONS:  None.   OPERATION:  Left total knee arthroplasty.   PERTINENT HISTORY:  This is a 65 year old black female I have been  following in my office for degenerative joint disease involving her left  knee treated with anti-inflammatory therapeutic injections and use of  cane.  Patient's condition progressively worsened over the past few  months with pain, locking, giving away and difficulty ambulating.  Pertinent physical is that of the left knee __________ posterior  effusion, crepitus in the medial and lateral compartment, tenderness  anteromedially and lateral compartment.  X-ray revealed loss of joint  space, osseous with sclerosis __________ .   HOSPITAL COURSE:  The patient had a preop medical evaluation by primary  care doctor followed by CBC, EKG, chest x-rays, CMET, GYN.  Patient's  laboratory studies were found to be stable, and the patient underwent  surgery.  The patient underwent a left total knee arthroplasty on  June 11, 2006.  She tolerated the procedure quite well.  Postop  course was fairly benign.  Partial weightbearing on the left side.  Uses  CPM.  Postop IV antibiotics, Coumadin prophylaxis.  OT/PT and case  management services.  Patient remained afebrile.  Pain was brought under  control with oral agents.  Patient was found to be stable for discharge  with home health and PT arranged.  She will return to the office in 1  week.  The patient  discharged on Coumadin and Percocet 1 every 4 p.r.n.  pain.  The patient was discharged in stable and satisfactory condition.      Myrtie Neither, MD  Electronically Signed     AC/MEDQ  D:  07/29/2006  T:  07/29/2006  Job:  [id number]

## 2010-11-18 ENCOUNTER — Ambulatory Visit (HOSPITAL_COMMUNITY)
Admission: RE | Admit: 2010-11-18 | Discharge: 2010-11-18 | Disposition: A | Discharge status: Home / Self Care | Payer: PRIVATE HEALTH INSURANCE | Source: Ambulatory Visit | Attending: Internal Medicine | Admitting: Internal Medicine

## 2010-11-18 DIAGNOSIS — R279 Unspecified lack of coordination: Secondary | ICD-10-CM | POA: Insufficient documentation

## 2010-11-18 DIAGNOSIS — G43909 Migraine, unspecified, not intractable, without status migrainosus: Secondary | ICD-10-CM

## 2010-11-18 DIAGNOSIS — Z853 Personal history of malignant neoplasm of breast: Secondary | ICD-10-CM | POA: Insufficient documentation

## 2010-11-18 MED ORDER — GADOBENATE DIMEGLUMINE 529 MG/ML IV SOLN: 20.0000 mL | Freq: Once | INTRAVENOUS | Status: DC

## 2010-11-21 ENCOUNTER — Telehealth: Payer: Self-pay | Admitting: *Deleted

## 2010-11-21 ENCOUNTER — Encounter: Payer: Self-pay | Admitting: Internal Medicine

## 2010-11-21 NOTE — Telephone Encounter (Signed)
RTC to pt given results of MRI as requested by Dr. Reche Dixon.  Pt said that she has been to Dr. Martie Round no Cataracts or other eye problems.  Is sensitive to light.  Pt said that she is feeling better and will call if she has problems.

## 2010-12-31 ENCOUNTER — Other Ambulatory Visit: Payer: Self-pay | Admitting: Oncology

## 2010-12-31 DIAGNOSIS — Z1231 Encounter for screening mammogram for malignant neoplasm of breast: Secondary | ICD-10-CM

## 2011-01-03 ENCOUNTER — Ambulatory Visit: Payer: PRIVATE HEALTH INSURANCE

## 2011-03-07 ENCOUNTER — Other Ambulatory Visit: Payer: Self-pay | Admitting: Orthopedic Surgery

## 2011-03-07 DIAGNOSIS — R609 Edema, unspecified: Secondary | ICD-10-CM

## 2011-03-07 DIAGNOSIS — R52 Pain, unspecified: Secondary | ICD-10-CM

## 2011-03-10 ENCOUNTER — Ambulatory Visit
Admission: RE | Admit: 2011-03-10 | Discharge: 2011-03-10 | Disposition: A | Discharge status: Home / Self Care | Payer: PRIVATE HEALTH INSURANCE | Source: Ambulatory Visit | Attending: Orthopedic Surgery | Admitting: Orthopedic Surgery

## 2011-03-10 DIAGNOSIS — R609 Edema, unspecified: Secondary | ICD-10-CM

## 2011-03-10 DIAGNOSIS — R52 Pain, unspecified: Secondary | ICD-10-CM

## 2011-04-03 LAB — COMPREHENSIVE METABOLIC PANEL
ALT: 13 U/L (ref 0–35)
AST: 15 U/L (ref 0–37)
Albumin: 3.6 g/dL (ref 3.5–5.2)
Calcium: 9.4 mg/dL (ref 8.4–10.5)
Chloride: 110 mEq/L (ref 96–112)
Creatinine, Ser: 0.76 mg/dL (ref 0.4–1.2)
GFR calc Af Amer: >60 mL/min (ref >60)
Sodium: 143 mEq/L (ref 135–145)

## 2011-04-03 LAB — URINALYSIS, ROUTINE W REFLEX MICROSCOPIC
Bilirubin Urine: NEGATIVE
Hgb urine dipstick: NEGATIVE
Ketones, ur: NEGATIVE mg/dL
Specific Gravity, Urine: 1.024 (ref 1.005–1.030)
pH: 5.5 (ref 5.0–8.0)

## 2011-04-03 LAB — CBC
MCHC: 33.7 g/dL (ref 30.0–36.0)
MCV: 92.9 fL (ref 78.0–100.0)
Platelets: 214 10*3/uL (ref 150–400)
RBC: 4.11 MIL/uL (ref 3.87–5.11)
WBC: 4.9 10*3/uL (ref 4.0–10.5)

## 2011-04-04 ENCOUNTER — Encounter: Payer: PRIVATE HEALTH INSURANCE | Admitting: Internal Medicine

## 2011-08-20 ENCOUNTER — Encounter: Payer: Self-pay | Admitting: Internal Medicine

## 2011-08-20 ENCOUNTER — Ambulatory Visit (INDEPENDENT_AMBULATORY_CARE_PROVIDER_SITE_OTHER): Payer: PRIVATE HEALTH INSURANCE | Admitting: Internal Medicine

## 2011-08-20 DIAGNOSIS — J45909 Unspecified asthma, uncomplicated: Secondary | ICD-10-CM

## 2011-08-20 DIAGNOSIS — R259 Unspecified abnormal involuntary movements: Secondary | ICD-10-CM

## 2011-08-20 DIAGNOSIS — R296 Repeated falls: Secondary | ICD-10-CM

## 2011-08-20 DIAGNOSIS — Z780 Asymptomatic menopausal state: Secondary | ICD-10-CM

## 2011-08-20 DIAGNOSIS — Z9181 History of falling: Secondary | ICD-10-CM

## 2011-08-20 DIAGNOSIS — Z0000 Encounter for general adult medical examination without abnormal findings: Secondary | ICD-10-CM

## 2011-08-20 DIAGNOSIS — G43909 Migraine, unspecified, not intractable, without status migrainosus: Secondary | ICD-10-CM

## 2011-08-20 DIAGNOSIS — R251 Tremor, unspecified: Secondary | ICD-10-CM | POA: Insufficient documentation

## 2011-08-20 DIAGNOSIS — Z1231 Encounter for screening mammogram for malignant neoplasm of breast: Secondary | ICD-10-CM

## 2011-08-20 MED ORDER — SUMATRIPTAN SUCCINATE 50 MG PO TABS: 50.0000 mg | ORAL_TABLET | Freq: Once | ORAL | Status: DC | PRN

## 2011-08-20 MED ORDER — ALBUTEROL 90 MCG/ACT IN AERS: 2.0000 | INHALATION_SPRAY | Freq: Four times a day (QID) | RESPIRATORY_TRACT | Status: DC | PRN

## 2011-08-20 NOTE — Progress Notes (Addendum)
Subjective:   Patient ID: [name] [name] female   DOB: Nov 21, 1945 66 y.o.   MRN: [id number]  HPI: [name].[name] [name] is a 66 y.o. lady with PMH as outlined below, who presents for a follow up visit. She reports that she is currently taking only one medication, Femara. She run off all other medications. She wants to get her medications be refilled. She wants to discuss several chronic issues today.  First, migraine headache: she reports that her migraine headache happens 2 or 3 times a year. She had one episodes on Oct of 2012 and another one two days ago. She did not have aura before her headache.  Second, frequent falls:  patient reports frequent falls that started about one year ago, approximately one episode a week. She did not have any precipitating factors. No dizziness or palpitation before her falls. She feels that her falls are due to loss of balance. She can not walk steadily. She never lost consciousness when she had falls. She usually get up by herself after falls. Patient has Aids from [facility],  71 hours/week which does not cover the weekend. Patient is asking for 9 more hours of Aids to cover for weekend visit.  Third, asthma: she is not taking any medications currently. She has mild SOB and wheezing, but no cough or chest pain. She reports having dyspnea at night proximately once a week.    Fourth, hand tremor: patient has hand tremor for long time (she could not remember when it started exactly), worse on the right hand. It happens all the time at rest and also when she uses her hands. There is no aggravating or alleviating factors. She feels weak in her hands, but no change in her sensation.   Denies fever, chills,  cough, chest pain, SOB,  abdominal pain,diarrhea, constipation, dysuria, urgency, frequency, hematuria.   Past Medical History  Diagnosis Date  . Cancer   . Hyperlipidemia   . Hypertension   . Asthma     best PEF=400  . 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 Chronic low back pain     MRI 2010 showed right sided disease with paracentral  disc protusion at L5-S1 which contacts and displaces the right S1 nerve root within the lateral recess  . Migraine headache     MRI 11/18/10: Mild progression of prominent white matter type changes which may be related to a small vessel disease and / or migraine headaches.Other causes for white matter type changes such as that secondaryto; vasculitis, inflammatory process or demyelinating process feltto be secondary considerations.  . Leg swelling   . Neuralgia and neuritis   . Tenosynovitis     left, MRI 05/2003  . Herniated disc     h/o ruptured disk, laminectomy L4-L5, Dr. Montez Morita  . Breast cancer     f/b Dr. Myrle Sheng. s/p masectomy +radation. w/ metastatic disease, now on Femara.  . Tobacco abuse    Current Outpatient Prescriptions  Medication Sig Dispense Refill  . letrozole (FEMARA) 2.5 MG tablet Take 2.5 mg by mouth daily.        [name] albuterol (PROVENTIL,VENTOLIN) 90 MCG/ACT inhaler Inhale 2 puffs into the lungs every 6 (six) hours as needed.  17 g  11  . cyclobenzaprine (FLEXERIL) 5 MG tablet Take 5 mg by mouth 2 (two) times daily as needed.        [name]  fluticasone (FLOVENT HFA) 44 MCG/ACT inhaler Inhale 1 puff into the lungs 2 (two) times daily.        . furosemide (LASIX) 40 MG tablet Take 40 mg by mouth 2 (two) times daily.        [name] gabapentin (NEURONTIN) 300 MG capsule Take 1 capsule (300 mg total) by mouth 3 (three) times daily. 1 caps at bedtime  90 capsule  1  . montelukast (SINGULAIR) 10 MG tablet Take 10 mg by mouth at bedtime.        . ranitidine (ZANTAC) 150 MG capsule Take 150 mg by mouth 2 (two) times daily.        . SUMAtriptan (IMITREX) 50 MG tablet Take 1 tablet (50 mg total) by mouth once as needed for migraine.  30 tablet  0  . triamcinolone (AZMACORT) 75 MCG/ACT inhaler Inhale 2 puffs  into the lungs 2 (two) times daily.         No family history on file. History   Social History  . Marital Status: Divorced    Spouse Name: N/A    Number of Children: N/A  . Years of Education: N/A   Social History Main Topics  . Smoking status: Current Everyday Smoker -- 0.5 packs/day    Types: Cigarettes  . Smokeless tobacco: Not on file   Comment: smoking a little less  . Alcohol Use: Not on file  . Drug Use: Not on file  . Sexually Active: Not on file   Other Topics Concern  . Not on file   Social History Narrative  . No narrative on file   Review of Systems:  General: no fevers, chills, no changes in body weight, no changes in appetite Skin: no rash HEENT: no blurry vision, hearing changes or sore throat. Has headache. Pulm: no dyspnea, coughing. Has mild SOB and wheezing CV: no chest pain, palpitations Abd: no nausea/vomiting, abdominal pain, diarrhea/constipation GU: no dysuria, hematuria, polyuria Ext: no edema or rashes Neurological: Positive for tremors and headaches. Negative for dizziness, seizures, syncope and facial asymmetry.   Objective:  Physical Exam: Filed Vitals:   08/20/11 0952 08/20/11 1018 08/20/11 1019 08/20/11 1020  BP: 156/83 136/83 154/86 142/86  Pulse: 77 74 77 84  Temp:      TempSrc:      Height:      Weight:       General: not in acute distress HEENT: PERRL, EOMI, no scleral icterus Cardiac: S1/S2, RRR, No murmurs, gallops or rubs Pulm: Good air movement bilaterally,  Mild expiratory wheezing bilaterally. No rales, rhonchi or rubs. Abd: Soft,  nondistended, nontender, no rebound pain, no organomegaly, BS present Ext: No rashes or edema, 2+DP/PT pulse bilaterally Neuro: alert and oriented X3, cranial nerves II-XII grossly intact, She displays tremors in her hands, worse on the right hand. Her tremor does not get worse when she tries to reach an object. Muscle strength 5/5 in all extremeties,  sensation to light touch intact. She has  slow finger to Nose action. Abnormal coordination and gait  Assessment & Plan:   # Asthma: her symptoms are mild, although she is on taking medications. She has mild expiratory wheezing on Lung auscultation. No signs of exacerbation. Will restart her albuterol inhaler and follow up.  # Falls: The etiology of the fall is uncertain at this time. Patient's orthostatic vitals are stable. CT-head and MRI-brain done on 5/12 did not show significant abnormalities. Her previous EKG did not have arrhythmia. Her RPR was negative on  4/12. Will check her Vitamin B12 and methylmalonic acid level. Will check her Vitamin D level since her Vitamin D was low on 4/12. Will give her referral to neurology and follow up. Will ask her RN from [facility] to evaluate patient for PT. Patient has home health aide about 2 or 3 hours per day M-F, no weekend visit. Will ask her RN from [facility] for evaluation and recommendation.   # Hand tremor: etiology is not clear now. It is not likely intentional tremor since her tremor does not get worse when she tries to reach an object. It is not typical for Parkinson's disease because she does not have shuffle gait or Cogwheel like movement on examination. But it could be the early stage of Parkinson's disease. Will give referral to Neurology and follow up.  # Migraine Headache:  she reports that her migraine headache happens 2 or 3 times a year. No need for prophylaxis. Will give her prescription of sumatriptan for acute symptoms. She does not have contraindication with sumatriptan use.  # HM: patient refused all vaccinations and colonoscope. Will get DEXA scan and Mammogram.   Lorretta Harp   I saw and discussed [name] [name] with Dr Clyde Lundborg. Pt with unsteady gait one year or so. Unable to rise from chair without use of arms. Ataxic gait with shuffling first step, able to turn, unsteady gait, lean to right. Very unsteady on feet. Freq falls - if next to furniture  or wall can steady herself but if outside, ends up on ground. Has no warning sxs that ready to fall. Needs Calcium and Vit D. Dexa scan to see fracture risk as high fall risk. Agree with neuro referral.  Irine Seal

## 2011-08-20 NOTE — Patient Instructions (Addendum)
1. Please take all medications as prescribed. Please take sumatriptan only when you have Migraine headache. 2. If you have worsening of your symptoms or new symptoms arise, please call the clinic [phone]), or go to the ER immediately if symptoms are severe.

## 2011-08-21 ENCOUNTER — Other Ambulatory Visit: Payer: Self-pay | Admitting: *Deleted

## 2011-08-21 ENCOUNTER — Emergency Department (HOSPITAL_COMMUNITY)
Admission: EM | Admit: 2011-08-21 | Discharge: 2011-08-21 | Payer: PRIVATE HEALTH INSURANCE | Source: Home / Self Care | Attending: Emergency Medicine | Admitting: Emergency Medicine

## 2011-08-21 DIAGNOSIS — C50919 Malignant neoplasm of unspecified site of unspecified female breast: Secondary | ICD-10-CM

## 2011-08-21 MED ORDER — LETROZOLE 2.5 MG PO TABS: 2.5000 mg | ORAL_TABLET | Freq: Every day | ORAL | Status: DC

## 2011-08-21 NOTE — Telephone Encounter (Signed)
Message from pt reporting she needs a refill on Femara, has missed several scheduled appts. Reviewed with Dr. Truett Perna: OK to refill, schedule pt for next available with MD or midlevel. Called pt with this info, she verbalized understanding.

## 2011-08-21 NOTE — Progress Notes (Signed)
Pt calls and states her scripts were not called to pharm or sent electronically, [facility] confirms that they have not rec'd them, pharm was instructed if pt brings written scripts later to destroy them

## 2011-08-22 ENCOUNTER — Telehealth: Payer: Self-pay | Admitting: Oncology

## 2011-08-22 LAB — VITAMIN D 1,25 DIHYDROXY
Vitamin D 1, 25 (OH)2 Total: 83 pg/mL — ABNORMAL HIGH (ref 18–72)
Vitamin D3 1, 25 (OH)2: 83 pg/mL

## 2011-08-22 NOTE — Telephone Encounter (Signed)
called pt and provided appts for [phone]

## 2011-08-25 ENCOUNTER — Telehealth: Payer: Self-pay | Admitting: *Deleted

## 2011-08-25 LAB — METHYLMALONIC ACID, SERUM: Methylmalonic Acid, Quant: 0.16 umol/L (ref <0.40)

## 2011-08-25 MED ORDER — ALBUTEROL SULFATE HFA 108 (90 BASE) MCG/ACT IN AERS: 2.0000 | INHALATION_SPRAY | Freq: Four times a day (QID) | RESPIRATORY_TRACT | Status: DC | PRN

## 2011-08-25 NOTE — Telephone Encounter (Signed)
Ventolin HFA  Inhaler is no longer covered per [facility] pharmacy. Can they have a new rx for ProAir?  Thanks

## 2011-08-29 ENCOUNTER — Other Ambulatory Visit (HOSPITAL_BASED_OUTPATIENT_CLINIC_OR_DEPARTMENT_OTHER): Payer: PRIVATE HEALTH INSURANCE | Admitting: Lab

## 2011-08-29 DIAGNOSIS — C50919 Malignant neoplasm of unspecified site of unspecified female breast: Secondary | ICD-10-CM

## 2011-08-29 DIAGNOSIS — L98499 Non-pressure chronic ulcer of skin of other sites with unspecified severity: Secondary | ICD-10-CM

## 2011-08-29 LAB — CANCER ANTIGEN 27.29: CA 27.29: 366 U/mL — ABNORMAL HIGH (ref 0–39)

## 2011-09-05 ENCOUNTER — Ambulatory Visit (HOSPITAL_BASED_OUTPATIENT_CLINIC_OR_DEPARTMENT_OTHER): Payer: Medicaid Other | Admitting: Nurse Practitioner

## 2011-09-05 ENCOUNTER — Telehealth: Payer: Self-pay | Admitting: Oncology

## 2011-09-05 ENCOUNTER — Other Ambulatory Visit: Payer: PRIVATE HEALTH INSURANCE | Admitting: Lab

## 2011-09-05 VITALS — BP 163/84 | HR 66 | Temp 97.0°F | Ht 68.0 in | Wt 210.6 lb

## 2011-09-05 DIAGNOSIS — C50919 Malignant neoplasm of unspecified site of unspecified female breast: Secondary | ICD-10-CM

## 2011-09-05 DIAGNOSIS — C800 Disseminated malignant neoplasm, unspecified: Secondary | ICD-10-CM

## 2011-09-05 NOTE — Progress Notes (Signed)
OFFICE PROGRESS NOTE  Interval history:  [name] [name] is a 66 year old woman with a long-standing history of metastatic breast cancer. She is maintained on Femara. She returns today after missing several scheduled followup visits. She was last seen in our office in March 2012.  She reports she is taking Femara. The left chest wall lesion has healed. She overall feels well. She has chronic pain involving the left knee. She has a good appetite. She has intermittent dyspnea on exertion. Over the past year she has been experiencing intermittent balance problems and falls. She reports she has been referred to neurology. She recently began Imitrex for migraines".   Objective: Blood pressure 163/84, pulse 66, temperature 97 F (36.1 C), temperature source Oral, height 5\' 8"  (1.727 m), weight 210 lb 9.6 oz (95.528 kg).  Oropharynx is without thrush or ulceration. No palpable cervical, supraclavicular or axillary lymph nodes. Status post left mastectomy. At the medial aspect of the mastectomy scar there is an approximate 1-1/2 centimeter slightly raised area with overlying eschar. Examination of the right breast shows an approximate 2 cm area of firmness at the 10:00 position 5-6 cm from the aerola. Lungs are clear. Breath sounds are diminished at the left lower chest. Regular cardiac rhythm. Abdomen is soft and nontender. No hepatomegaly. Pitting edema at the lower legs bilaterally left greater than right.  Lab Results: Lab Results  Component Value Date   WBC 5.6 10/23/2010   HGB 12.1 10/23/2010   HCT 37.7 10/23/2010   MCV 93.8 10/23/2010   PLT 247 10/23/2010    Chemistry:    Chemistry      Component Value Date/Time   NA 143 10/23/2010 1514   K 4.4 10/23/2010 1514   CL 107 10/23/2010 1514   CO2 27 10/23/2010 1514   BUN 19 10/23/2010 1514   CREATININE 0.89 10/23/2010 1514   CREATININE 0.89 09/13/2010 1045   CREATININE 0.89 09/13/2010 1045   CREATININE 0.89 09/13/2010 1045      Component Value Date/Time    CALCIUM 9.7 10/23/2010 1514   ALKPHOS 86 10/23/2010 1514   AST 11 10/23/2010 1514   ALT 8 10/23/2010 1514   BILITOT 0.3 10/23/2010 1514       Studies/Results: No results found.  Medications: I have reviewed the patient's current medications.  Assessment/Plan:  1.  Metastatic breast cancer - initially diagnosed with breast cancer in 1996 status post left mastectomy and axillary lymph node dissection.  She developed a chest wall recurrence in 2001 and completed radiation.  In April 2003, CT showed mediastinal, internal mammary, and right axillary adenopathy and two liver lesions.  MRI of the thoracic spine June 18, 2002 revealed osseous abnormalities at T3 and T4 suspicious for metastatic disease with extension into the T3 pedicle.  There was extra osseous extension compressing and displacing the thoracic cord.  She completed radiation from T1 through T6 June 21, 2002 through July 13, 2002.  CT scan Nov 11, 2002 showed progression of metastatic disease with precardiac nodes, left pleural and lower lobe disease, subcutaneous nodules at the left anterior chest wall and progressive metastatic disease involving the liver.  She began Femara in May/early June 2004 with subsequent resolution of chest wall nodules.  CT scans January 14, 2005, of the chest and abdomen revealed findings of decreased pleural-based metastatic disease in the lower left hemithorax and no new or progressive disease in the chest.  Small lesion in the right liver measuring 13.0 x 10.0 mm was reported as stable when compared  to her previous study.  No other liver lesions were seen.  Bone scan January 22, 2006 showed degenerative changes.  She discontinued Femara February 2009 and was lost a followup.  She presented in March 2010 with an ulcerated chest wall lesion and an elevated CA27.29.  Restaging CTs September 18, 2008 showed a 1.3 x 2.2 cm subdermal lesion at the left anterior chest, multiple enlarged metastases in the left  hemithorax at the mediastinum and pleura felt to represent necrotic lymph nodes versus pleural metastases.  Right axillary lymph node was decreased in size compared to 2006.  There were no suspicious bone lesions.  A previously noted lesion in the inferior right liver was not clearly seen.  She resumed Femara March 2010.  On October 23, 2008 the chest wall lesion had improved.  The CA27.29 on October 23, 2008 was stable at 60.  She discontinued Femara in June 2010 and CTs of the abdomen and pelvis April 06, 2009 showed slight decrease in the size of a pleural-based nodule in the left lower lobe.  There was no other evidence for metastatic disease in the abdomen or pelvis.  She resumed Femara on April 11, 2009.  CA27.29 returned at 87 on April 11, 2009.  CT of the abdomen and pelvis on May 30, 2009 showed an increase in multiple pericardial masses, increased left chest wall subcutaneous nodule, stable pleural-based metastases and stable liver lesions.  She discontinued Femara in November 2010 due to nausea and vomiting.  Femara was resumed following an office visit June 06, 2009.  Dr. Myrle Sheng reviewed a CT from May 30, 2009 with a [name] [name] radiologist comparing the scan to scans from March 2010 and October 2010.  In addition, a CT scan from 2006 was reviewed.  The pleural-based lesions in the left chest had not changed significantly since March 2010.  The pericardial masses appeared slightly larger.  Both the pericardial masses and pleural-based masses were larger compared to the CT from 2006.  She discontinued Femara January 2011. The left chest wall nodule recurred during the time she was off of Femara. Femara was resumed following an office visit 10/02/2009. The nodule again improved. Restaging CT of the chest 08/16/2010 was stable compared to a CT from 03/29/2010. Most recent CA 27-29 on 08/29/2011 was stable at 366. She continues Femara. 2.  Ulcerated left chest lesion, likely a metastatic  lesion. Improved. 3.  Severe right low back/buttock pain radiating to the left leg October 2010 with MRI on April 06, 2009 revealing a disk protrusion at L5-S1 displacing the right S1 nerve root.  She was evaluated by Dr. Jordan Likes.  The pain has resolved. 4.  Hospitalization December 1 through May 31, 2009 with nausea, vomiting and abdominal pain - resolved.  5.  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 6.  Pain at the right back/ iliac region October 25, 2008. 7.  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 8.  Status post left knee replacement. 9.  Chronic edema of the left lower leg. 10. "Migraines". She is taking Imitrex. 11. Falls and balance problems occurring over the past year. She has been referred to neurology. 12. Right breast with area of firmness. We are referring her for a diagnostic mammogram.  Disposition-Ms. Durio appears well. She will continue Femara. We will followup on the mammogram. She will return for a followup visit and CA 27-29 in 4 months. She will contact the office in the  interim with any problems.  Plan reviewed with Dr. Truett Perna.  [name] [name] ANP/GNP-BC

## 2011-09-05 NOTE — Telephone Encounter (Signed)
Gave pt appt calendar for July 2013 and mammogram appt on 09/15/11

## 2011-09-15 ENCOUNTER — Ambulatory Visit
Admission: RE | Admit: 2011-09-15 | Discharge: 2011-09-15 | Disposition: A | Discharge status: Home / Self Care | Payer: PRIVATE HEALTH INSURANCE | Source: Ambulatory Visit | Attending: Nurse Practitioner | Admitting: Nurse Practitioner

## 2011-09-15 ENCOUNTER — Ambulatory Visit
Admission: RE | Admit: 2011-09-15 | Discharge: 2011-09-15 | Disposition: A | Discharge status: Home / Self Care | Payer: PRIVATE HEALTH INSURANCE | Source: Ambulatory Visit | Attending: Internal Medicine | Admitting: Internal Medicine

## 2011-09-15 ENCOUNTER — Other Ambulatory Visit: Payer: Self-pay | Admitting: Internal Medicine

## 2011-09-15 DIAGNOSIS — Z1231 Encounter for screening mammogram for malignant neoplasm of breast: Secondary | ICD-10-CM

## 2011-09-15 DIAGNOSIS — Z780 Asymptomatic menopausal state: Secondary | ICD-10-CM

## 2011-09-15 DIAGNOSIS — C50919 Malignant neoplasm of unspecified site of unspecified female breast: Secondary | ICD-10-CM

## 2011-10-02 ENCOUNTER — Encounter: Payer: Self-pay | Admitting: Internal Medicine

## 2011-10-02 ENCOUNTER — Telehealth: Payer: Self-pay | Admitting: Licensed Clinical Social Worker

## 2011-10-02 ENCOUNTER — Telehealth: Payer: Self-pay | Admitting: *Deleted

## 2011-10-02 NOTE — Telephone Encounter (Signed)
Pt calls and states she needs a letter stating that she needs someone in the home 24/7 with her for her medical safety, addressed to:  [facility] Attn: alisa and dianne [phone] phone [phone] fax

## 2011-10-02 NOTE — Telephone Encounter (Signed)
CSW received call from pt.  Pt states she has been having increased falls.  Pt states she has a HHA that is with her M-F, but is in need of having someone with her on the weekends.  Pt's daughter is willing to assist and stay with pt over the weekend.  Pt needs letter of medical necessity provided to landlord in order for dau to stay weekend with pt.  CSW will forward to PCP

## 2011-11-13 ENCOUNTER — Encounter (HOSPITAL_COMMUNITY): Payer: Self-pay | Admitting: Pharmacy Technician

## 2011-11-14 ENCOUNTER — Encounter (HOSPITAL_COMMUNITY): Payer: Self-pay

## 2011-11-14 ENCOUNTER — Encounter (HOSPITAL_COMMUNITY)
Admission: RE | Admit: 2011-11-14 | Discharge: 2011-11-14 | Disposition: A | Discharge status: Home / Self Care | Payer: PRIVATE HEALTH INSURANCE | Source: Ambulatory Visit | Attending: Orthopedic Surgery | Admitting: Orthopedic Surgery

## 2011-11-14 LAB — CBC
MCH: 30.3 pg (ref 26.0–34.0)
MCHC: 33.6 g/dL (ref 30.0–36.0)
MCV: 90.2 fL (ref 78.0–100.0)
Platelets: 220 10*3/uL (ref 150–400)
RBC: 4.29 MIL/uL (ref 3.87–5.11)
RDW: 14.1 % (ref 11.5–15.5)

## 2011-11-14 LAB — BASIC METABOLIC PANEL
BUN: 14 mg/dL (ref 6–23)
CO2: 28 mEq/L (ref 19–32)
Calcium: 9.5 mg/dL (ref 8.4–10.5)
Creatinine, Ser: 0.82 mg/dL (ref 0.50–1.10)
GFR calc non Af Amer: 72 mL/min — ABNORMAL LOW (ref >90)
Glucose, Bld: 85 mg/dL (ref 70–99)

## 2011-11-14 NOTE — Pre-Procedure Instructions (Signed)
20 [name] [name]  11/14/2011   Your procedure is scheduled on:  Wednesday at 0830 AM  Report to [facility] at 0630 AM.  Call this number if you have problems the morning of surgery: [phone]   Remember:   Do not eat food:After Midnight.Tuesday  May have clear liquids: up to 4 Hours before arrival.0230 AM  Clear liquids include soda, tea, black coffee, apple or grape juice, broth.  Take these medicines the morning of surgery with A SIP OF WATER: Femara, Bring inhaler   Do not wear jewelry, make-up or nail polish.  Do not wear lotions, powders, or perfumes. You may wear deodorant.  Do not shave 48 hours prior to surgery. Men may shave face and neck.  Do not bring valuables to the hospital.  Contacts, dentures or bridgework may not be worn into surgery.  Leave suitcase in the car. After surgery it may be brought to your room.  For patients admitted to the hospital, checkout time is 11:00 AM the day of discharge.   Patients discharged the day of surgery will not be allowed to drive home.  Name and phone number of your driver: Lillia Mountain  Special Instructions: CHG Shower Use Special Wash: 1/2 bottle night before surgery and 1/2 bottle morning of surgery.   Please read over the following fact sheets that you were given: Pain Booklet, Coughing and Deep Breathing, MRSA Information and Surgical Site Infection Prevention

## 2011-11-17 ENCOUNTER — Other Ambulatory Visit: Payer: Self-pay | Admitting: Orthopedic Surgery

## 2011-11-17 NOTE — Progress Notes (Signed)
No orders noted in chart.  Office notified.//L. Addysen Louth,RN

## 2011-11-18 NOTE — Progress Notes (Addendum)
Spoke with Dr Assunta Found office... She will  Ask Dr  C if we need cmet ...because at pat there were no orders and the rn used anesthesia orders and did a bmet. Marland KitchenMarland KitchenMarland KitchenThey will ask him when he comes in and call me at [phone]  Called Dr. Celene Skeen office again for clarification on CMET. BMET performed at [facility] per anesthesia standing orders. Secretary states she will speak with Dr. Montez Morita and return call. Karleen Hampshire, [place]udd

## 2011-11-25 MED ORDER — CHLORHEXIDINE GLUCONATE 4 % EX LIQD: 60.0000 mL | Freq: Once | CUTANEOUS | Status: DC

## 2011-11-25 MED ORDER — CEFAZOLIN SODIUM 1-5 GM-% IV SOLN
1.0000 g | INTRAVENOUS | Status: AC
  Administered 2011-11-26: 1 g via INTRAVENOUS
  Filled 2011-11-25: qty 50

## 2011-11-25 NOTE — Progress Notes (Signed)
[name] [name] from Dr. Celene Skeen office stated the BMET was acceptable for labs instead of CMET that was ordered after pt. had labs drawn at pre-admission appointment.

## 2011-11-26 ENCOUNTER — Encounter (HOSPITAL_COMMUNITY): Payer: Self-pay | Admitting: Anesthesiology

## 2011-11-26 ENCOUNTER — Ambulatory Visit (HOSPITAL_COMMUNITY)
Admission: RE | Admit: 2011-11-26 | Discharge: 2011-11-26 | Disposition: A | Discharge status: Home / Self Care | Payer: PRIVATE HEALTH INSURANCE | Source: Ambulatory Visit | Attending: Orthopedic Surgery | Admitting: Orthopedic Surgery

## 2011-11-26 ENCOUNTER — Encounter (HOSPITAL_COMMUNITY)
Admission: RE | Disposition: A | Discharge status: Home / Self Care | Payer: Self-pay | Source: Ambulatory Visit | Attending: Orthopedic Surgery

## 2011-11-26 ENCOUNTER — Ambulatory Visit (HOSPITAL_COMMUNITY): Payer: PRIVATE HEALTH INSURANCE | Admitting: Anesthesiology

## 2011-11-26 DIAGNOSIS — M898X9 Other specified disorders of bone, unspecified site: Secondary | ICD-10-CM | POA: Insufficient documentation

## 2011-11-26 DIAGNOSIS — I10 Essential (primary) hypertension: Secondary | ICD-10-CM | POA: Insufficient documentation

## 2011-11-26 DIAGNOSIS — R51 Headache: Secondary | ICD-10-CM | POA: Insufficient documentation

## 2011-11-26 DIAGNOSIS — M79609 Pain in unspecified limb: Secondary | ICD-10-CM | POA: Insufficient documentation

## 2011-11-26 DIAGNOSIS — F17200 Nicotine dependence, unspecified, uncomplicated: Secondary | ICD-10-CM | POA: Insufficient documentation

## 2011-11-26 DIAGNOSIS — K08109 Complete loss of teeth, unspecified cause, unspecified class: Secondary | ICD-10-CM | POA: Insufficient documentation

## 2011-11-26 DIAGNOSIS — J45909 Unspecified asthma, uncomplicated: Secondary | ICD-10-CM | POA: Insufficient documentation

## 2011-11-26 DIAGNOSIS — Z853 Personal history of malignant neoplasm of breast: Secondary | ICD-10-CM | POA: Insufficient documentation

## 2011-11-26 DIAGNOSIS — G8918 Other acute postprocedural pain: Secondary | ICD-10-CM

## 2011-11-26 DIAGNOSIS — L609 Nail disorder, unspecified: Secondary | ICD-10-CM | POA: Insufficient documentation

## 2011-11-26 HISTORY — PX: AMPUTATION: SHX166

## 2011-11-26 LAB — URINE MICROSCOPIC-ADD ON

## 2011-11-26 LAB — URINALYSIS, ROUTINE W REFLEX MICROSCOPIC
Bilirubin Urine: NEGATIVE
Glucose, UA: NEGATIVE mg/dL
Hgb urine dipstick: NEGATIVE
Ketones, ur: NEGATIVE mg/dL
Specific Gravity, Urine: 1.015 (ref 1.005–1.030)
pH: 6.5 (ref 5.0–8.0)

## 2011-11-26 SURGERY — AMPUTATION DIGIT
Anesthesia: General | Site: Toe | Laterality: Bilateral | Wound class: Clean Contaminated

## 2011-11-26 MED ORDER — PHENYLEPHRINE HCL 10 MG/ML IJ SOLN
INTRAMUSCULAR | Status: DC | PRN
  Administered 2011-11-26 (×4): 40 ug via INTRAVENOUS

## 2011-11-26 MED ORDER — 0.9 % SODIUM CHLORIDE (POUR BTL) OPTIME
TOPICAL | Status: DC | PRN
  Administered 2011-11-26: 1000 mL

## 2011-11-26 MED ORDER — PROPOFOL 10 MG/ML IV EMUL
INTRAVENOUS | Status: DC | PRN
  Administered 2011-11-26: 180 mg via INTRAVENOUS
  Administered 2011-11-26: 50 mg via INTRAVENOUS

## 2011-11-26 MED ORDER — BUPIVACAINE HCL (PF) 0.25 % IJ SOLN
INTRAMUSCULAR | Status: DC | PRN
  Administered 2011-11-26: 30 mL

## 2011-11-26 MED ORDER — OXYCODONE-ACETAMINOPHEN 7.5-325 MG PO TABS: 1.0000 | ORAL_TABLET | ORAL | Status: AC | PRN

## 2011-11-26 MED ORDER — HYDROMORPHONE HCL PF 1 MG/ML IJ SOLN: 0.2500 mg | INTRAMUSCULAR | Status: DC | PRN

## 2011-11-26 MED ORDER — ONDANSETRON HCL 4 MG/2ML IJ SOLN: 4.0000 mg | Freq: Four times a day (QID) | INTRAMUSCULAR | Status: DC | PRN

## 2011-11-26 MED ORDER — LACTATED RINGERS IV SOLN
INTRAVENOUS | Status: DC | PRN
  Administered 2011-11-26 (×2): via INTRAVENOUS

## 2011-11-26 MED ORDER — MIDAZOLAM HCL 5 MG/5ML IJ SOLN
INTRAMUSCULAR | Status: DC | PRN
  Administered 2011-11-26: 1 mg via INTRAVENOUS

## 2011-11-26 MED ORDER — FENTANYL CITRATE 0.05 MG/ML IJ SOLN
INTRAMUSCULAR | Status: DC | PRN
  Administered 2011-11-26 (×2): 50 ug via INTRAVENOUS

## 2011-11-26 MED ORDER — ALBUTEROL SULFATE HFA 108 (90 BASE) MCG/ACT IN AERS
INHALATION_SPRAY | RESPIRATORY_TRACT | Status: DC | PRN
  Administered 2011-11-26: 2 via RESPIRATORY_TRACT

## 2011-11-26 MED ORDER — ONDANSETRON HCL 4 MG/2ML IJ SOLN
INTRAMUSCULAR | Status: DC | PRN
  Administered 2011-11-26: 4 mg via INTRAVENOUS

## 2011-11-26 SURGICAL SUPPLY — 44 items
BANDAGE CONFORM 2  STR LF (GAUZE/BANDAGES/DRESSINGS) IMPLANT
BANDAGE ELASTIC 3 VELCRO ST LF (GAUZE/BANDAGES/DRESSINGS) ×2 IMPLANT
BANDAGE GAUZE ELAST BULKY 4 IN (GAUZE/BANDAGES/DRESSINGS) ×1 IMPLANT
BLADE MINI RND TIP GREEN BEAV (BLADE) IMPLANT
BLADE SURG 15 STRL LF DISP TIS (BLADE) IMPLANT
BLADE SURG 15 STRL SS (BLADE) ×2
BNDG CMPR 9X4 STRL LF SNTH (GAUZE/BANDAGES/DRESSINGS) ×1
BNDG ESMARK 4X9 LF (GAUZE/BANDAGES/DRESSINGS) ×1 IMPLANT
CLOTH BEACON ORANGE TIMEOUT ST (SAFETY) ×2 IMPLANT
CORDS BIPOLAR (ELECTRODE) ×2 IMPLANT
COVER SURGICAL LIGHT HANDLE (MISCELLANEOUS) ×2 IMPLANT
CUFF TOURNIQUET SINGLE 34IN LL (TOURNIQUET CUFF) IMPLANT
CUFF TOURNIQUET SINGLE 44IN (TOURNIQUET CUFF) IMPLANT
DRAPE BILATERAL SPLIT (DRAPES) ×1 IMPLANT
DRSG EMULSION OIL 3X3 NADH (GAUZE/BANDAGES/DRESSINGS) ×1 IMPLANT
DURAPREP 26ML APPLICATOR (WOUND CARE) ×2 IMPLANT
ELECT NDL BLADE 2-5/6 (NEEDLE) IMPLANT
ELECT NEEDLE BLADE 2-5/6 (NEEDLE) ×2 IMPLANT
GAUZE SPONGE 2X2 8PLY STRL LF (GAUZE/BANDAGES/DRESSINGS) IMPLANT
GLOVE BIOGEL PI IND STRL 8 (GLOVE) IMPLANT
GLOVE BIOGEL PI INDICATOR 8 (GLOVE) ×2
GLOVE ECLIPSE 6.5 STRL STRAW (GLOVE) ×1 IMPLANT
GLOVE SS BIOGEL STRL SZ 8 (GLOVE) IMPLANT
GLOVE SS PI 9.0 STRL (GLOVE) ×2 IMPLANT
GLOVE SUPERSENSE BIOGEL SZ 8 (GLOVE) ×1
GOWN PREVENTION PLUS XLARGE (GOWN DISPOSABLE) ×1 IMPLANT
GOWN STRL NON-REIN LRG LVL3 (GOWN DISPOSABLE) ×1 IMPLANT
KIT BASIN OR (CUSTOM PROCEDURE TRAY) ×2 IMPLANT
KIT ROOM TURNOVER OR (KITS) ×2 IMPLANT
MANIFOLD NEPTUNE II (INSTRUMENTS) ×1 IMPLANT
NDL HYPO 25GX1X1/2 BEV (NEEDLE) IMPLANT
NEEDLE HYPO 25GX1X1/2 BEV (NEEDLE) ×2 IMPLANT
NS IRRIG 1000ML POUR BTL (IV SOLUTION) ×2 IMPLANT
PACK ORTHO EXTREMITY (CUSTOM PROCEDURE TRAY) ×2 IMPLANT
PAD ARMBOARD 7.5X6 YLW CONV (MISCELLANEOUS) ×3 IMPLANT
RUBBERBAND STERILE (MISCELLANEOUS) ×1 IMPLANT
SPECIMEN JAR SMALL (MISCELLANEOUS) ×1 IMPLANT
SPONGE GAUZE 2X2 STER 10/PKG (GAUZE/BANDAGES/DRESSINGS) ×1
SPONGE GAUZE 4X4 12PLY (GAUZE/BANDAGES/DRESSINGS) IMPLANT
SUT ETHILON 4 0 PS 2 18 (SUTURE) ×2 IMPLANT
SYR CONTROL 10ML LL (SYRINGE) ×1 IMPLANT
TOWEL OR 17X24 6PK STRL BLUE (TOWEL DISPOSABLE) ×1 IMPLANT
TOWEL OR 17X26 10 PK STRL BLUE (TOWEL DISPOSABLE) ×1 IMPLANT
WATER STERILE IRR 1000ML POUR (IV SOLUTION) ×1 IMPLANT

## 2011-11-26 NOTE — Anesthesia Procedure Notes (Signed)
Procedure Name: LMA Insertion Date/Time: 11/26/2011 8:41 AM Performed by: Romie Minus K Pre-anesthesia Checklist: Patient identified, Emergency Drugs available, Suction available, Patient being monitored and Timeout performed Patient Re-evaluated:Patient Re-evaluated prior to inductionOxygen Delivery Method: Circle system utilized Preoxygenation: Pre-oxygenation with 100% oxygen Intubation Type: IV induction Ventilation: Mask ventilation without difficulty LMA: LMA inserted LMA Size: 4.0 Number of attempts: 1 Placement Confirmation: positive ETCO2 and breath sounds checked- equal and bilateral Tube secured with: Tape Dental Injury: Teeth and Oropharynx as per pre-operative assessment

## 2011-11-26 NOTE — H&P (Signed)
[name] [name], SMAY NO.:  [id number]  MEDICAL RECORD NO.:  [id number]  LOCATION:  [facility]                         FACILITY:  [facility]  PHYSICIAN:  Myrtie Neither, MD      DATE OF BIRTH:  10/20/1945  DATE OF ADMISSION:  11/26/2011 DATE OF DISCHARGE:                             HISTORY & PHYSICAL   CHIEF COMPLAINT:  Severe painful toenails right 5th toe and left 3rd toe.  This is a 66 year old female, who has had recurrence of deformed nails involving the right 5th toe and the left 3rd toe with osteophytes and calcifications in the soft tissue.  The patient is unable to wear clothes and shoes because they are very sensitive and painful to touch.  PAST MEDICAL HISTORY:  The patient has had: 1. Breast cancer. 2. Hyperlipidemia. 3. Hypertension. 4. Asthma. 5. History of meningitis. 6. Degenerative disk disease. 7. Migraine headache. 8. Peripheral edema. 9. Neuralgia neuritis.  SOCIAL HISTORY:  Use of tobacco.  Denies use of alcohol or illegal drugs.  PAST SURGERY HISTORY:  Hysterectomy, C-section, mastectomy, laminectomy, appendectomy, total knee replacement left knee, lumbar laminectomy, and breast surgery for cancer.  FAMILY HISTORY:  Noncontributory.  REVIEW OF SYSTEMS:  No cardiac, respiratory, no urinary or bowel symptoms.  ALLERGIES:  None known.  MEDICATIONS: 1. Albuterol HFA. 2. Ventolin. 3. Femara. 4. Imitrex. 5. Lorcet 10/650. 6. Lodine 400 XL b.i.d.  PHYSICAL EXAMINATION:  GENERAL: Alert and oriented, in no acute distress. VITAL SIGNS: Temperature 97.4, pulse 74, respirations 20, blood pressure 168/92, O2 saturation 95%. HEAD: Normocephalic. EYES: Conjunctivae are clear. NECK: Supple. CHEST: Clear. CARDIAC: S1, S2.  Regular. EXTREMITIES: Left foot, markedly tender 3rd toe nail deformity with palpable osteophyte.  The right foot, tender, deformed, toenail bed with osteophyte as well.  Other toe nails had been resected  without recurrence.  X-rays revealed there was some modest calcification in the soft tissues of both right 5th toe and the left 3rd toe.  IMPRESSION:  Painful deformed toenails right 5th toe and left 3rd toe with deformed nail beds and osteophytes.  PLAN:  Radical excision with nail beds and phalangectomy right 5th toe and left 3rd toe.     Myrtie Neither, MD     AC/MEDQ  D:  11/26/2011  T:  11/26/2011  Job:  [id number]

## 2011-11-26 NOTE — H&P (Signed)
[name] [name] is an 66 y.o. female.   Chief Complaint: PAINFUL RIGHT 5TH TOE AND LEFT 3RD TOE HPI: THIS IS A 65Y/O FEMALE C/O PAINFUL TOES ON BOTH FEET WITH DEFORMED TOE NAILS.  Past Medical History  Diagnosis Date  . Cancer   . Hyperlipidemia   . Hypertension   . Asthma     best PEF=400  . 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 Chronic low back pain     MRI 2010 showed right sided disease with paracentral  disc protusion at L5-S1 which contacts and displaces the right S1 nerve root within the lateral recess  . Migraine headache     MRI 11/18/10: Mild progression of prominent white matter type changes which may be related to a small vessel disease and / or migraine headaches.Other causes for white matter type changes such as that secondaryto; vasculitis, inflammatory process or demyelinating process feltto be secondary considerations.  . Leg swelling   . Neuralgia and neuritis   . Tenosynovitis     left, MRI 05/2003  . Herniated disc     h/o ruptured disk, laminectomy L4-L5, Dr. Montez Morita  . Breast cancer     f/b Dr. Myrle Sheng. s/p masectomy +radation. w/ metastatic disease, now on Femara.  . Tobacco abuse     Past Surgical History  Procedure Date  . Abdominal hysterectomy   . Cesarean section   . Mastectomy   . Laminectomy     L4-L5  . Appendectomy   . Joint replacement     Left Knee  . Back surgery   . Breast surgery     Family History  Problem Relation Age of Onset  . Anesthesia problems Neg Hx   . Hypotension Neg Hx   . Malignant hyperthermia Neg Hx   . Pseudochol deficiency Neg Hx    Social History:  reports that she has been smoking Cigarettes.  She has been smoking about .5 packs per day. She has never used smokeless tobacco. She reports that she does not drink alcohol or use illicit drugs.  Allergies: No Known Allergies  Medications Prior to Admission  Medication Sig  Dispense Refill  . albuterol (PROVENTIL HFA;VENTOLIN HFA) 108 (90 BASE) MCG/ACT inhaler Inhale 2 puffs into the lungs every 6 (six) hours as needed. Shortness of breath      . letrozole (FEMARA) 2.5 MG tablet Take 2.5 mg by mouth daily.      . SUMAtriptan (IMITREX) 50 MG tablet Take 50 mg by mouth daily as needed. For migraines        Results for orders placed during the hospital encounter of 11/26/11 (from the past 48 hour(s))  URINALYSIS, ROUTINE W REFLEX MICROSCOPIC     Status: Abnormal   Collection Time   11/26/11  6:32 AM      Component Value Range Comment   Color, Urine YELLOW  YELLOW     APPearance CLOUDY (*) CLEAR     Specific Gravity, Urine 1.015  1.005 - 1.030     pH 6.5  5.0 - 8.0     Glucose, UA NEGATIVE  NEGATIVE (mg/dL)    Hgb urine dipstick NEGATIVE  NEGATIVE     Bilirubin Urine NEGATIVE  NEGATIVE     Ketones, ur NEGATIVE  NEGATIVE (mg/dL)    Protein, ur NEGATIVE  NEGATIVE (mg/dL)    Urobilinogen, UA 0.2  0.0 - 1.0 (mg/dL)    Nitrite POSITIVE (*) NEGATIVE     Leukocytes, UA SMALL (*) NEGATIVE    URINE MICROSCOPIC-ADD ON     Status: Abnormal   Collection Time   11/26/11  6:32 AM      Component Value Range Comment   Squamous Epithelial / LPF RARE  RARE     WBC, UA 3-6  <3 (WBC/hpf)    Bacteria, UA MANY (*) RARE     No results found.  Review of Systems  Constitutional: Negative.   HENT: Negative.   Eyes: Negative.   Respiratory: Negative.   Cardiovascular: Negative.   Gastrointestinal: Negative.   Genitourinary: Negative.   Musculoskeletal: Negative for joint pain.  Skin: Negative.   Neurological: Negative.   Endo/Heme/Allergies: Negative.   Psychiatric/Behavioral: Negative.     Blood pressure 142/86, pulse 95, temperature 98.8 F (37.1 C), temperature source Oral, resp. rate 18, SpO2 96.00%. Physical Exam RIGHT FOOT WITH DEFORMED 5TH TOE NAIL MARKEDLY TENDER . LEFT FOOT WITH DEFORMED TOE NAIL ,TENDER TO TOUCH.  Assessment/Plan PAINFUL DEFORMED TOE NAILS  WITH OSTEOPHYTES, RIGHT 5TH TOE AND LEFT 3RD TOE/PLAN RADICAL NAIL EXCISION AND PHALANGECTOMY RIGHT 5TH TOE AND LEFT 3RD TOE.  Kennieth Rad 11/26/2011, 8:29 AM

## 2011-11-26 NOTE — Progress Notes (Signed)
Dr Chaney Malling aware of pt's  BP in pacu. States pt may go home at this time,  No orders received.

## 2011-11-26 NOTE — Anesthesia Preprocedure Evaluation (Addendum)
Anesthesia Evaluation  Patient identified by MRN, date of birth, ID band Patient awake    Reviewed: Allergy & Precautions, H&P , NPO status , Patient's Chart, lab work & pertinent test results  History of Anesthesia Complications Negative for: history of anesthetic complications  Airway Mallampati: II  Neck ROM: full    Dental  (+) Edentulous Upper, Edentulous Lower and Dental Advisory Given   Pulmonary asthma , Current Smoker,  1/2  Ppd x  50 yrs Daily inhaler -  Patient states he home inhaler "ran out" Will give albuterol inhaler in               Holding room   + decreased breath sounds      Cardiovascular hypertension, Rhythm:Regular Rate:Normal     Neuro/Psych  Headaches,  Neuromuscular disease    GI/Hepatic   Endo/Other    Renal/GU      Musculoskeletal   Abdominal   Peds  Hematology   Anesthesia Other Findings   Reproductive/Obstetrics                          Anesthesia Physical Anesthesia Plan  ASA: II  Anesthesia Plan: General   Post-op Pain Management:    Induction: Intravenous  Airway Management Planned: LMA  Additional Equipment:   Intra-op Plan:   Post-operative Plan:   Informed Consent: I have reviewed the patients History and Physical, chart, labs and discussed the procedure including the risks, benefits and alternatives for the proposed anesthesia with the patient or authorized representative who has indicated his/her understanding and acceptance.     Plan Discussed with: CRNA and Surgeon  Anesthesia Plan Comments:         Anesthesia Quick Evaluation

## 2011-11-26 NOTE — Preoperative (Signed)
Beta Blockers   Reason not to administer Beta Blockers:Not Applicable 

## 2011-11-26 NOTE — Transfer of Care (Signed)
Immediate Anesthesia Transfer of Care Note  Patient: [name] [name]  Procedure(s) Performed: Procedure(s) (LRB): AMPUTATION DIGIT (Bilateral)  Patient Location: PACU  Anesthesia Type: General  Level of Consciousness: awake, alert , oriented and patient cooperative  Airway & Oxygen Therapy: Patient Spontanous Breathing and Patient connected to nasal cannula oxygen  Post-op Assessment: Report given to PACU RN and Post -op Vital signs reviewed and stable  Post vital signs: Reviewed  Complications: No apparent anesthesia complications

## 2011-11-26 NOTE — Op Note (Signed)
[name] [name], ROBARDS NO.:  [id number]  MEDICAL RECORD NO.:  [id number]  LOCATION:  [facility]                         FACILITY:  [facility]  PHYSICIAN:  Myrtie Neither, MD      DATE OF BIRTH:  Jul 14, 1945  DATE OF PROCEDURE:  11/26/2011 DATE OF DISCHARGE:                              OPERATIVE REPORT   CHIEF COMPLAINT:  Painful toes, right 5th toe deformed toenail and the left 3rd toe deformity.  PREOPERATIVE DIAGNOSES:  Deformed toenails right 5th toe with osteophyte and left 3rd toe with osteophyte formation and deformed toenail.  POSTOPERATIVE DIAGNOSES:  Deformed toenails right 5th toe with osteophyte and left 3rd toe with osteophyte formation and deformed toenail.  ANESTHESIA:  General.  PROCEDURE:  Radical resection of the right 5th toenail with phalangectomy, hammertoe left 3rd toe radical nail resection with phalangectomy.  The patient was taken to the operating room.  After given adequate preop medications and given general anesthesia and intubated, both feet were prepped with DuraPrep and draped in sterile manner.  Local rubber band tourniquet was used for hemostasis.  An elliptical incision about the nail bed was done with complete excision of the nail and nail bed itself.  Palpable osteophyte of the phalanx was identified and sharp dissection about the proximal phalanx was done and complete the resection on the right 5th toe.  Irrigation was then done. Wound closure was done with 4-0 nylon.  Local Marcaine was injected, 0.25% plain.  Compressive dressing was applied.  On the left 3rd toe, an elliptical incision was made about the nail bed and nail, complete dissection with sharp and blunt dissection was made of the middle phalanx with complete resection.  Irrigation was then done, followed by wound closure with 4-0 nylon.  Compressive dressing was applied.  Plain Marcaine 0.25% was injected into the 3rd toe as well.  The patient tolerated procedure quite  well, went to recovery room in stable and satisfactory condition.     Myrtie Neither, MD     AC/MEDQ  D:  11/26/2011  T:  11/26/2011  Job:  [id number]

## 2011-11-26 NOTE — Progress Notes (Signed)
Orthopedic Tech Progress Note Patient Details:  [name] [name] Nov 15, 1945 [id number]  Ortho Devices Type of Ortho Device: Postop boot Ortho Device/Splint Location: post op shoes x 2 Ortho Device/Splint Interventions: Application   Cammer, Mickie Bail 11/26/2011, 10:27 AM

## 2011-11-26 NOTE — Brief Op Note (Signed)
11/26/2011  9:44 AM  PATIENT:  [name] [name]  67 y.o. female  PRE-OPERATIVE DIAGNOSIS:  PAINFUL DEFORMED TOE NAIL, BILATERAL FEET  POST-OPERATIVE DIAGNOSIS:  PAINFUL DEFORMED TOE NAIL, BILATERAL FEET  PROCEDURE:  Procedure(s) (LRB): AMPUTATION DIGIT (Bilateral)  SURGEON:  Surgeon(s) and Role:    * Kennieth Rad, MD - Primary  PHYSICIAN ASSISTANT:   ASSISTANTS: none   ANESTHESIA:   general  EBL:  Total I/O In: 1000 [I.V.:1000] Out: -   BLOOD ADMINISTERED:none  DRAINS: none   LOCAL MEDICATIONS USED:  MARCAINE     SPECIMEN:  No Specimen  DISPOSITION OF SPECIMEN:  N/A  COUNTS:  YES  TOURNIQUET:  * No tourniquets in log *  DICTATION: .Other Dictation: Dictation Number [id number]  PLAN OF CARE: Discharge to home after PACU  PATIENT DISPOSITION:  PACU - hemodynamically stable.   Delay start of Pharmacological VTE agent (>24hrs) due to surgical blood loss or risk of bleeding: not applicable

## 2011-11-26 NOTE — Anesthesia Postprocedure Evaluation (Signed)
Anesthesia Post Note  Patient: [name] [name]  Procedure(s) Performed: Procedure(s) (LRB): AMPUTATION DIGIT (Bilateral)  Anesthesia type: General  Patient location: PACU  Post pain: Pain level controlled and Adequate analgesia  Post assessment: Post-op Vital signs reviewed, Patient's Cardiovascular Status Stable, Respiratory Function Stable, Patent Airway and Pain level controlled  Last Vitals:  Filed Vitals:   11/26/11 1019  BP:   Pulse: 81  Temp:   Resp: 17    Post vital signs: Reviewed and stable  Level of consciousness: awake, alert  and oriented  Complications: No apparent anesthesia complications

## 2011-11-27 ENCOUNTER — Encounter (HOSPITAL_COMMUNITY): Payer: Self-pay | Admitting: Orthopedic Surgery

## 2011-12-05 ENCOUNTER — Ambulatory Visit: Payer: PRIVATE HEALTH INSURANCE | Admitting: Nurse Practitioner

## 2011-12-24 ENCOUNTER — Emergency Department (HOSPITAL_COMMUNITY): Payer: PRIVATE HEALTH INSURANCE

## 2011-12-24 ENCOUNTER — Encounter (HOSPITAL_COMMUNITY): Payer: Self-pay | Admitting: Family Medicine

## 2011-12-24 ENCOUNTER — Emergency Department (HOSPITAL_COMMUNITY)
Admission: EM | Admit: 2011-12-24 | Discharge: 2011-12-24 | Disposition: A | Discharge status: Home / Self Care | Payer: PRIVATE HEALTH INSURANCE | Attending: Emergency Medicine | Admitting: Emergency Medicine

## 2011-12-24 DIAGNOSIS — Y998 Other external cause status: Secondary | ICD-10-CM | POA: Insufficient documentation

## 2011-12-24 DIAGNOSIS — I10 Essential (primary) hypertension: Secondary | ICD-10-CM | POA: Insufficient documentation

## 2011-12-24 DIAGNOSIS — S0083XA Contusion of other part of head, initial encounter: Secondary | ICD-10-CM | POA: Insufficient documentation

## 2011-12-24 DIAGNOSIS — S0990XA Unspecified injury of head, initial encounter: Secondary | ICD-10-CM

## 2011-12-24 DIAGNOSIS — Z853 Personal history of malignant neoplasm of breast: Secondary | ICD-10-CM | POA: Insufficient documentation

## 2011-12-24 DIAGNOSIS — F17200 Nicotine dependence, unspecified, uncomplicated: Secondary | ICD-10-CM | POA: Insufficient documentation

## 2011-12-24 DIAGNOSIS — E785 Hyperlipidemia, unspecified: Secondary | ICD-10-CM | POA: Insufficient documentation

## 2011-12-24 DIAGNOSIS — J45909 Unspecified asthma, uncomplicated: Secondary | ICD-10-CM | POA: Insufficient documentation

## 2011-12-24 DIAGNOSIS — S98139A Complete traumatic amputation of one unspecified lesser toe, initial encounter: Secondary | ICD-10-CM | POA: Insufficient documentation

## 2011-12-24 DIAGNOSIS — Z79899 Other long term (current) drug therapy: Secondary | ICD-10-CM | POA: Insufficient documentation

## 2011-12-24 DIAGNOSIS — Z96659 Presence of unspecified artificial knee joint: Secondary | ICD-10-CM | POA: Insufficient documentation

## 2011-12-24 DIAGNOSIS — S0003XA Contusion of scalp, initial encounter: Secondary | ICD-10-CM | POA: Insufficient documentation

## 2011-12-24 DIAGNOSIS — IMO0002 Reserved for concepts with insufficient information to code with codable children: Secondary | ICD-10-CM | POA: Insufficient documentation

## 2011-12-24 MED ORDER — IBUPROFEN 600 MG PO TABS: 600.0000 mg | ORAL_TABLET | Freq: Four times a day (QID) | ORAL | Status: AC | PRN

## 2011-12-24 MED ORDER — OXYCODONE-ACETAMINOPHEN 5-325 MG PO TABS
1.0000 | ORAL_TABLET | Freq: Once | ORAL | Status: AC
  Administered 2011-12-24: 1 via ORAL
  Filled 2011-12-24: qty 1

## 2011-12-24 NOTE — ED Provider Notes (Signed)
History     CSN: [id number]  Arrival date & time 12/24/11  0901   First MD Initiated Contact with Patient 12/24/11 [phone]      Chief Complaint  Patient presents with  . Headache    (Consider location/radiation/quality/duration/timing/severity/associated sxs/prior treatment) HPI Pt presenting with c/o pain in right forehead after hitting her head on a door.  Pt states she thinks she was sleepwalking- she was awakened when her head struck a door.  She fell backwards, has been having pain in that area since this morning.  Also some right sided neck pain.  No changes in vision, no LOC, no vomiting, no seizure activity.  No weakness or numbness of arms or legs.  No back pain.  She has not tried anything for her pain.  There are no other associated systemic symptoms, there are no alleviating or modifying factors.   Past Medical History  Diagnosis Date  . Cancer   . Hyperlipidemia   . Hypertension   . Asthma     best PEF=400  . 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 Chronic low back pain     MRI 2010 showed right sided disease with paracentral  disc protusion at L5-S1 which contacts and displaces the right S1 nerve root within the lateral recess  . Migraine headache     MRI 11/18/10: Mild progression of prominent white matter type changes which may be related to a small vessel disease and / or migraine headaches.Other causes for white matter type changes such as that secondaryto; vasculitis, inflammatory process or demyelinating process feltto be secondary considerations.  . Leg swelling   . Neuralgia and neuritis   . Tenosynovitis     left, MRI 05/2003  . Herniated disc     h/o ruptured disk, laminectomy L4-L5, Dr. Montez Morita  . Breast cancer     f/b Dr. Myrle Sheng. s/p masectomy +radation. w/ metastatic disease, now on Femara.  . Tobacco abuse     Past Surgical History  Procedure Date  . Abdominal  hysterectomy   . Cesarean section   . Mastectomy   . Laminectomy     L4-L5  . Appendectomy   . Joint replacement     Left Knee  . Back surgery   . Breast surgery   . Amputation 11/26/2011    Procedure: AMPUTATION DIGIT;  Surgeon: Kennieth Rad, MD;  Location: [facility] OR;  Service: Orthopedics;  Laterality: Bilateral;  PHALANGECTOMY  - Right fifth toe and left third toe    Family History  Problem Relation Age of Onset  . Anesthesia problems Neg Hx   . Hypotension Neg Hx   . Malignant hyperthermia Neg Hx   . Pseudochol deficiency Neg Hx     History  Substance Use Topics  . Smoking status: Current Everyday Smoker -- 0.5 packs/day    Types: Cigarettes  . Smokeless tobacco: Never Used   Comment: smoking a little less  . Alcohol Use: No    OB History    Grav Para Term Preterm Abortions TAB SAB Ect Mult Living                  Review of Systems ROS reviewed and all otherwise negative except for mentioned in HPI  Allergies  Review of patient's allergies indicates no known allergies.  Home Medications   Current Outpatient Rx  Name Route  Sig Dispense Refill  . ALBUTEROL SULFATE HFA 108 (90 BASE) MCG/ACT IN AERS Inhalation Inhale 2 puffs into the lungs every 6 (six) hours as needed. Shortness of breath    . LETROZOLE 2.5 MG PO TABS Oral Take 2.5 mg by mouth daily.    . SUMATRIPTAN SUCCINATE 50 MG PO TABS Oral Take 50 mg by mouth daily as needed. For migraines    . IBUPROFEN 600 MG PO TABS Oral Take 1 tablet (600 mg total) by mouth every 6 (six) hours as needed for pain. 30 tablet 0    BP 166/78  Pulse 56  Temp 98.3 F (36.8 C) (Oral)  Resp 18  SpO2 97% Vitals reviewed Physical Exam Physical Examination: General appearance - alert, concerned appearing, and in no distress Mental status - alert, oriented to person, place, and time Eyes - pupils equal and reactive, extraocular eye movements intact Mouth - mucous membranes moist, pharynx normal without lesions Neck -  supple, no midline cspine tenderness Chest - clear to auscultation, no wheezes, rales or rhonchi, symmetric air entry Heart - normal rate, regular rhythm, normal S1, S2, no murmurs, rubs, clicks or gallops Abdomen - soft, nontender, nondistended, no masses or organomegaly Back exam - full range of motion, no tenderness, palpable spasm or pain on motion Musculoskeletal - no joint tenderness, deformity or swelling Extremities - peripheral pulses normal, no pedal edema, no clubbing or cyanosis Skin - normal coloration and turgor, no rashes, small hematoma over right side of forehead  ED Course  Procedures (including critical care time)  9:33 AM went to see patient, she is not yet in room  Labs Reviewed - No data to display No results found.   1. Minor head injury   2. Contusion of forehead       MDM  Pt presenting with c/o headache after striking her head this morning.  Head CT reassuring.  Neuro exam normal.  Pt treated with percocet for head pain.  Discharged with strict return precautions, pt is agreeable with this plan.  Rx given for ibuprofen for headache.          Ethelda Chick, MD 12/27/11 [phone]

## 2011-12-24 NOTE — ED Notes (Signed)
Per pt was sleep walking this am and hit head on the door. Pt sts dent on forehead. Complaining of 9/10 pain in head. Denies any other complaints. Pt A&Ox3. Denies LOC

## 2011-12-30 ENCOUNTER — Ambulatory Visit (INDEPENDENT_AMBULATORY_CARE_PROVIDER_SITE_OTHER): Payer: PRIVATE HEALTH INSURANCE | Admitting: Internal Medicine

## 2011-12-30 ENCOUNTER — Encounter: Payer: Self-pay | Admitting: Internal Medicine

## 2011-12-30 VITALS — BP 153/85 | HR 73 | Temp 97.3°F | Wt 212.9 lb

## 2011-12-30 DIAGNOSIS — M79605 Pain in left leg: Secondary | ICD-10-CM

## 2011-12-30 DIAGNOSIS — M79604 Pain in right leg: Secondary | ICD-10-CM

## 2011-12-30 DIAGNOSIS — O2230 Deep phlebothrombosis in pregnancy, unspecified trimester: Secondary | ICD-10-CM

## 2011-12-30 DIAGNOSIS — M7989 Other specified soft tissue disorders: Secondary | ICD-10-CM

## 2011-12-30 DIAGNOSIS — M79609 Pain in unspecified limb: Secondary | ICD-10-CM

## 2011-12-30 NOTE — Progress Notes (Signed)
Patient ID: [name] [name], female   DOB: March 13, 1946, 66 y.o.   MRN: [id number]  Subjective:   Patient ID: [name] [name] female   DOB: October 20, 1945 66 y.o.   MRN: [id number]  HPI: [name] is a 66 y.o. with a past medical history of long-standing history of metastasized to cancer, currently on Femaral, who presents with acute visit for bilateral lower leg pain.  Patient reports that she has been having bilateral lower leg pain and swelling for more than 2 months. The left is worse than the right side. The pain is located at both anterior and posterior lower legs. 10/10 in severity. Patient didn't have long distance traveling recently. She does not have a fever, chills, sick contact, or any injury to her legs. Currently patient doesn't have cough, chest pain or palpitation. Patient has chronic shortness of breath secondary to asthma. Patient recently did not use inhalers. She said that Medicare didn't pay her inhalers. Her chronic shortness of breath has not been worsening acutely in these days.   Past Medical History  Diagnosis Date  . Cancer   . Hyperlipidemia   . Hypertension   . Asthma     best PEF=400  . 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 Chronic low back pain     MRI 2010 showed right sided disease with paracentral  disc protusion at L5-S1 which contacts and displaces the right S1 nerve root within the lateral recess  . Migraine headache     MRI 11/18/10: Mild progression of prominent white matter type changes which may be related to a small vessel disease and / or migraine headaches.Other causes for white matter type changes such as that secondaryto; vasculitis, inflammatory process or demyelinating process feltto be secondary considerations.  . Leg swelling   . Neuralgia and neuritis   . Tenosynovitis     left, MRI 05/2003  . Herniated disc     h/o ruptured disk, laminectomy L4-L5,  Dr. Montez Morita  . Breast cancer     f/b Dr. Myrle Sheng. s/p masectomy +radation. w/ metastatic disease, now on Femara.  . Tobacco abuse    Current Outpatient Prescriptions  Medication Sig Dispense Refill  . albuterol (PROVENTIL HFA;VENTOLIN HFA) 108 (90 BASE) MCG/ACT inhaler Inhale 2 puffs into the lungs every 6 (six) hours as needed. Shortness of breath      . ibuprofen (ADVIL,MOTRIN) 600 MG tablet Take 1 tablet (600 mg total) by mouth every 6 (six) hours as needed for pain.  30 tablet  0  . letrozole (FEMARA) 2.5 MG tablet Take 2.5 mg by mouth daily.      . SUMAtriptan (IMITREX) 50 MG tablet Take 50 mg by mouth daily as needed. For migraines       Family History  Problem Relation Age of Onset  . Anesthesia problems Neg Hx   . Hypotension Neg Hx   . Malignant hyperthermia Neg Hx   . Pseudochol deficiency Neg Hx    History   Social History  . Marital Status: Divorced    Spouse Name: N/A    Number of Children: N/A  . Years of Education: N/A   Social History Main Topics  . Smoking status: Current Everyday Smoker -- 0.5 packs/day    Types: Cigarettes  . Smokeless tobacco: Never Used   Comment: smoking a little less  . Alcohol Use: No  .  Drug Use: No  . Sexually Active: Not Currently   Other Topics Concern  . None   Social History Narrative  . None   Review of Systems:  General: no fevers, chills, no changes in body weight, no changes in appetite Skin: no rash HEENT: no blurry vision, hearing changes or sore throat Pulm: has chronic SOB.  no dyspnea, coughing, wheezing CV: no chest pain, palpitations, shortness of breath Abd: no nausea/vomiting, abdominal pain, diarrhea/constipation GU: no dysuria, hematuria, polyuria Ext: has bilateral leg pain and swelling. Has hand tremor bilaterally. Neuro: no weakness, numbness, or tingling    Objective:  Physical Exam: Filed Vitals:   12/30/11 0849  BP: 151/84  Pulse: 74  Temp: 97.3 F (36.3 C)  TempSrc: Oral  Weight: 212  lb 14.4 oz (96.571 kg)   General: resting in bed, not in acute distress HEENT: PERRL, EOMI, no scleral icterus Cardiac: S1/S2, RRR, No murmurs, gallops or rubs Pulm: Good air movement bilaterally, Clear to auscultation bilaterally, No rales, wheezing, rhonchi or rubs. Abd: Soft,  nondistended, nontender, no rebound pain, no organomegaly, BS present Ext: Bilateral leg edema, worse on the left side. There is a severe tenderness over both anterior and posterior legs bilaterally. There is no warmth over her legs both anteriorly and posteriorly. 2+DP/PT pulse bilaterally. Patient has hand tremors. Musculoskeletal: No joint deformities, erythema, or stiffness, ROM full and no nontender Skin: no rashes. No skin bruise. Neuro: alert and oriented X3, cranial nerves II-XII grossly intact, muscle strength 5/5 in all extremeties,  sensation to light touch intact.  Psych.: patient is not psychotic, no suicidal or hemocidal ideation.    Assessment & Plan:   # Leg pain and swelling: Patient's symptoms are concerning for DVT. Her risk factors include age and active cancer (patient has metastasized breast cancer) on Femara. Another differential diagnosis is metastasized breast cancer to her lower leg bones. We planned to do venous Doppler to rule out DVT for patient. Patient came in with her aid. Because her aid had an another appointment and could not wait until patient's DVT was down. Patient  didn't want to be alone here for doing DVT. We explained to her in detail that if she had DVT, she would have high risk for getting life threatening pulmonary embolism. She understood our explanation and still wanted to leave for home. She singed AMA form and left clinic. We suggested to her to come back in tomorrow morning to get venous Doppler done. If venous Doppler shows DVT, we'll treat the patient with the standard protocol for DVT treatment. If venous Doppler is negative for DVT, patient may need to have further workup  for metastatic cancer to leg bones. Today patient doesn't have signs for pulmonary embolism, such as chest pain and tachycardia.  Patient has chronic shortness of breath due to asthma, which has not been acutely worsening.   Lorretta Harp, MD PGY1, Internal Medicine Teaching Service Pager: [phone]

## 2011-12-31 ENCOUNTER — Ambulatory Visit (HOSPITAL_COMMUNITY)
Admission: RE | Admit: 2011-12-31 | Discharge: 2011-12-31 | Disposition: A | Discharge status: Home / Self Care | Payer: PRIVATE HEALTH INSURANCE | Source: Ambulatory Visit | Attending: Internal Medicine | Admitting: Internal Medicine

## 2011-12-31 ENCOUNTER — Ambulatory Visit (INDEPENDENT_AMBULATORY_CARE_PROVIDER_SITE_OTHER): Payer: PRIVATE HEALTH INSURANCE | Admitting: Internal Medicine

## 2011-12-31 ENCOUNTER — Encounter: Payer: Self-pay | Admitting: Internal Medicine

## 2011-12-31 VITALS — BP 133/74 | HR 81 | Temp 97.6°F | Ht 68.0 in | Wt 213.7 lb

## 2011-12-31 DIAGNOSIS — M7989 Other specified soft tissue disorders: Secondary | ICD-10-CM | POA: Insufficient documentation

## 2011-12-31 DIAGNOSIS — M79609 Pain in unspecified limb: Secondary | ICD-10-CM

## 2011-12-31 DIAGNOSIS — O2230 Deep phlebothrombosis in pregnancy, unspecified trimester: Secondary | ICD-10-CM

## 2011-12-31 DIAGNOSIS — J45909 Unspecified asthma, uncomplicated: Secondary | ICD-10-CM

## 2011-12-31 DIAGNOSIS — M79604 Pain in right leg: Secondary | ICD-10-CM

## 2011-12-31 DIAGNOSIS — M79606 Pain in leg, unspecified: Secondary | ICD-10-CM

## 2011-12-31 MED ORDER — FLUTICASONE PROPIONATE HFA 44 MCG/ACT IN AERO: 1.0000 | INHALATION_SPRAY | Freq: Two times a day (BID) | RESPIRATORY_TRACT | Status: DC

## 2011-12-31 MED ORDER — HYDROCODONE-ACETAMINOPHEN 5-500 MG PO TABS: 1.0000 | ORAL_TABLET | ORAL | Status: DC | PRN

## 2011-12-31 MED ORDER — ALBUTEROL SULFATE HFA 108 (90 BASE) MCG/ACT IN AERS: 2.0000 | INHALATION_SPRAY | Freq: Four times a day (QID) | RESPIRATORY_TRACT | Status: DC | PRN

## 2011-12-31 NOTE — Progress Notes (Signed)
Bilateral lower extremity venous duplex completed.  Preliminary report is negative for DVT, SVT, or a Baker's cyst. 

## 2011-12-31 NOTE — Patient Instructions (Signed)
1. Your tests showed that you do not have blood clots in your legs. Your x-ray of legs did not show acute issues. I will give you pain medication prescription today. You have an appointment with oncologist,  Dr. Maisie Fus on 01/05/12. Please keep his appointment for further evaluation.  2. Please take all medications as prescribed.  3. If you have worsening of your symptoms or new symptoms arise, please call the clinic ([phone]), or go to the ER immediately if symptoms are severe.

## 2012-01-01 NOTE — Assessment & Plan Note (Signed)
  Leg pain and swelling: Patient's symptoms are concerning for DVT. Her risk factors include age and active cancer (patient has metastasized breast cancer) on Femara. Venous doppler showed no obvious evidence of deep vein or superficial thrombosis in legs and also no evidence of Baker's cyst on the right or left legs. Another potential differential diagnosis is metastasized breast cancer to her lower leg bones. X-ray of legs showed no fracture or dislocation and are not impressive for cancer metastasis. However, given the low sensitivity of plain film of X-ray, we still suspect that patient may have metastatic disease. She may need MRI for further evaluation.   Plan: --Patient has an appointment with oncologist on 01/05/12. She is instructed to keep this appointment for further evaluation. -- Will control her leg pain with Vicodin to bridge her until she sees her oncologist -- Will follow up oncologist's recommendations.

## 2012-01-01 NOTE — Progress Notes (Signed)
Patient ID: [name] [name], female   DOB: [birth year], 66 y.o.   MRN: [id number]  Subjective:   Patient ID: [name] [name] female   DOB: 04/22/1946 66 y.o.   MRN: [id number]  HPI: [name] is a 66 y.o.    [name] is a 66 y.o. with a past medical history of long-standing history of metastasized to breast cancer, currently on Femaral, who comes back today following an acute visit yesterday due to bilateral lower leg pain.  Yesterday, patient presented with bilateral lower leg pain. Patient reported that she had bilateral lower leg pain and swelling for more than 2 months. The left wass worse than the right side. The pain was located at both anterior and posterior lower legs, 10/10 in severity. Patient didn't have long distance traveling recently. She did not have a fever, chills, sick contact, or any injury to her legs. Patient did have cough, chest pain or palpitation. Given her age and active cancer, we thought patient had high risk for DVT. We planned to do venous Doppler to rule out DVT for patient. However, patient came in with her aid. Because her aid had an another appointment and could not wait until patient's DVT was down. Patient  didn't want to be alone here for doing doppler. She left after she signed AMA form. Today she comes back with the same leg pain, which has not changed in nature. She dose not have chest pain, cough or palpitation.   Patient has chronic shortness of breath secondary to asthma. Patient recently did not use inhalers. Her chronic shortness of breath has not been worsening in these days.   Past Medical History  Diagnosis Date  . Cancer   . Hyperlipidemia   . Hypertension   . Asthma     best PEF=400  . 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 Chronic low back pain     MRI 2010 showed right sided disease with paracentral  disc protusion at L5-S1 which  contacts and displaces the right S1 nerve root within the lateral recess  . Migraine headache     MRI 11/18/10: Mild progression of prominent white matter type changes which may be related to a small vessel disease and / or migraine headaches.Other causes for white matter type changes such as that secondaryto; vasculitis, inflammatory process or demyelinating process feltto be secondary considerations.  . Leg swelling   . Neuralgia and neuritis   . Tenosynovitis     left, MRI 05/2003  . Herniated disc     h/o ruptured disk, laminectomy L4-L5, Dr. Montez Morita  . Breast cancer     f/b Dr. Myrle Sheng. s/p masectomy +radation. w/ metastatic disease, now on Femara.  . Tobacco abuse    Current Outpatient Prescriptions  Medication Sig Dispense Refill  . albuterol (PROVENTIL HFA;VENTOLIN HFA) 108 (90 BASE) MCG/ACT inhaler Inhale 2 puffs into the lungs every 6 (six) hours as needed. Shortness of breath  1 Inhaler  3  . fluticasone (FLOVENT HFA) 44 MCG/ACT inhaler Inhale 1 puff into the lungs 2 (two) times daily.  1 Inhaler  12  . HYDROcodone-acetaminophen (VICODIN) 5-500 MG per tablet Take 1 tablet by mouth every 4 (four) hours as needed for pain.  30 tablet  0  . ibuprofen (ADVIL,MOTRIN) 600 MG tablet Take 1 tablet (600 mg total) by mouth every 6 (six)  hours as needed for pain.  30 tablet  0  . letrozole (FEMARA) 2.5 MG tablet Take 2.5 mg by mouth daily.      . SUMAtriptan (IMITREX) 50 MG tablet Take 50 mg by mouth daily as needed. For migraines       Family History  Problem Relation Age of Onset  . Anesthesia problems Neg Hx   . Hypotension Neg Hx   . Malignant hyperthermia Neg Hx   . Pseudochol deficiency Neg Hx    History   Social History  . Marital Status: Divorced    Spouse Name: N/A    Number of Children: N/A  . Years of Education: N/A   Social History Main Topics  . Smoking status: Current Everyday Smoker -- 0.5 packs/day    Types: Cigarettes  . Smokeless tobacco: Never Used   Comment:  smoking a little less  . Alcohol Use: No  . Drug Use: No  . Sexually Active: Not Currently   Other Topics Concern  . None   Social History Narrative  . None   Review of Systems: General: no fevers, chills, no changes in body weight, no changes in appetite Skin: no rash HEENT: no blurry vision, hearing changes or sore throat Pulm: has chronic SOB.  no dyspnea, coughing, wheezing CV: no chest pain, palpitations, shortness of breath Abd: no nausea/vomiting, abdominal pain, diarrhea/constipation GU: no dysuria, hematuria, polyuria Ext: has bilateral leg pain and swelling. Has hand tremor bilaterally. Neuro: no weakness, numbness, or tingling    Objective:  Physical Exam: Filed Vitals:   12/31/11 1044  BP: 133/74  Pulse: 81  Temp: 97.6 F (36.4 C)  TempSrc: Oral  Height: 5\' 8"  (1.727 m)  Weight: 213 lb 11.2 oz (96.934 kg)   General: resting in bed, not in acute distress HEENT: PERRL, EOMI, no scleral icterus Cardiac: S1/S2, RRR, No murmurs, gallops or rubs Pulm: Good air movement bilaterally, Clear to auscultation bilaterally, No rales, wheezing, rhonchi or rubs. Abd: Soft,  nondistended, nontender, no rebound pain, no organomegaly, BS present. There is no inguinal node enlargement bilaterally. Ext: Bilateral leg edema, worse on the left side. There is a severe tenderness over both anterior and posterior legs bilaterally. There is no warmth over her legs both anteriorly and posteriorly. 2+DP/PT pulse bilaterally. Patient has hand tremors. Musculoskeletal: No joint deformities, erythema, or stiffness, ROM full and no nontender Skin: no rashes. No skin bruise. Neuro: alert and oriented X3, cranial nerves II-XII grossly intact, muscle strength 5/5 in all extremeties,  sensation to light touch intact.   Psych.: patient is not psychotic, no suicidal or hemocidal ideation.  Assessment & Plan:

## 2012-01-01 NOTE — Assessment & Plan Note (Signed)
Asthma: Patient has chronic shortness of breath secondary to asthma. Patient recently did not use inhalers. Her chronic shortness of breath has not been worsening in these days. Her lung auscultation is clear. Will treat with Albuterol and Fluticasone inhalers.

## 2012-01-05 ENCOUNTER — Telehealth: Payer: Self-pay | Admitting: *Deleted

## 2012-01-05 ENCOUNTER — Other Ambulatory Visit: Payer: PRIVATE HEALTH INSURANCE

## 2012-01-05 ENCOUNTER — Other Ambulatory Visit: Payer: Self-pay | Admitting: *Deleted

## 2012-01-05 ENCOUNTER — Ambulatory Visit: Payer: PRIVATE HEALTH INSURANCE | Admitting: Nurse Practitioner

## 2012-01-05 NOTE — Telephone Encounter (Signed)
Patient had lost her appointment schedule since daughter is now working and not keeping up with her appointments. Needs to reschedule, but needs to have am appt. And 48 hour notice so her home health aid can drive her to visit. She had thought that Lonna Cobb was new oncologist and she did not want to change physicians...reassured her that Misty Stanley is the same NP she has seen for years that works with Dr. Truett Perna. She adds that she has had recent trouble with BLE edema and has had doppler that is negative for clot, but primary care is still concerned about it. Will get her rescheduled 1st available with NP.

## 2012-01-05 NOTE — Telephone Encounter (Signed)
Patient confirmed over the phone the new date and time of the appointment

## 2012-01-09 ENCOUNTER — Other Ambulatory Visit: Payer: PRIVATE HEALTH INSURANCE | Admitting: Lab

## 2012-01-09 ENCOUNTER — Ambulatory Visit: Payer: PRIVATE HEALTH INSURANCE | Admitting: Oncology

## 2012-01-15 ENCOUNTER — Other Ambulatory Visit: Payer: Self-pay | Admitting: *Deleted

## 2012-01-16 ENCOUNTER — Other Ambulatory Visit (HOSPITAL_BASED_OUTPATIENT_CLINIC_OR_DEPARTMENT_OTHER): Payer: PRIVATE HEALTH INSURANCE | Admitting: Lab

## 2012-01-16 ENCOUNTER — Telehealth: Payer: Self-pay | Admitting: Oncology

## 2012-01-16 ENCOUNTER — Ambulatory Visit (HOSPITAL_BASED_OUTPATIENT_CLINIC_OR_DEPARTMENT_OTHER): Payer: Medicaid Other | Admitting: Oncology

## 2012-01-16 VITALS — BP 139/71 | HR 85 | Temp 97.5°F | Ht 68.0 in | Wt 211.9 lb

## 2012-01-16 DIAGNOSIS — M25519 Pain in unspecified shoulder: Secondary | ICD-10-CM

## 2012-01-16 DIAGNOSIS — C787 Secondary malignant neoplasm of liver and intrahepatic bile duct: Secondary | ICD-10-CM

## 2012-01-16 DIAGNOSIS — Z853 Personal history of malignant neoplasm of breast: Secondary | ICD-10-CM

## 2012-01-16 DIAGNOSIS — C50919 Malignant neoplasm of unspecified site of unspecified female breast: Secondary | ICD-10-CM

## 2012-01-16 DIAGNOSIS — C781 Secondary malignant neoplasm of mediastinum: Secondary | ICD-10-CM

## 2012-01-16 LAB — CBC WITH DIFFERENTIAL/PLATELET
Eosinophils Absolute: 0 10*3/uL (ref 0.0–0.5)
LYMPH%: 21.8 % (ref 14.0–49.7)
MONO#: 0.4 10*3/uL (ref 0.1–0.9)
NEUT#: 3.4 10*3/uL (ref 1.5–6.5)
Platelets: 211 10*3/uL (ref 145–400)
RBC: 3.96 10*6/uL (ref 3.70–5.45)
RDW: 14.4 % (ref 11.2–14.5)
WBC: 5 10*3/uL (ref 3.9–10.3)

## 2012-01-16 LAB — CANCER ANTIGEN 27.29: CA 27.29: 344 U/mL — ABNORMAL HIGH (ref 0–39)

## 2012-01-16 NOTE — Progress Notes (Signed)
[facility]    OFFICE PROGRESS NOTE   INTERVAL HISTORY:   She returns as scheduled. She is taking Femara and reports feeling well. She recently struck her head on a door while "sleep walking ". A CT of the head on 12/24/2011 revealed no acute abnormality. She complains of pain at the left scapula for the past one week. The pain wakes her up at night. No spine pain. She is taking Tylenol and ibuprofen for relief of the pain. The left lower leg has been swollen. A bilateral lower showed a Doppler on 12/31/2011 was negative for deep vein thrombosis.  The left chest wall is not bleeding.    Objective:  Vital signs in last 24 hours:  Blood pressure 139/71, pulse 85, temperature 97.5 F (36.4 C), temperature source Oral, height 5\' 8"  (1.727 m), weight 211 lb 14.4 oz (96.117 kg).    HEENT: Neck without mass Lymphatics: No cervical, supraclavicular, or axillary nodes Resp: End inspiratory rails at the left posterior base, no respiratory distress Cardio: Regular rate and rhythm GI: No hepatomegaly, nontender Vascular: The left lower leg is slightly larger than the right side. There are venous varicosities. No erythema, palpable cord, or tenderness. Musculoskeletal: Tenderness at the inferior aspect of the left scapula. No tenderness over the ribs or spine. No mass.  Breasts: Status post left mastectomy. There is a crusted slightly nodular area near the medial aspect of the mastectomy scar. Right breast without mass. No other chest wall nodules.     Lab Results:  Lab Results  Component Value Date   WBC 5.0 01/16/2012   HGB 12.3 01/16/2012   HCT 36.7 01/16/2012   MCV 92.7 01/16/2012   PLT 211 01/16/2012   CA 27.29 pending    Medications: I have reviewed the patient's current medications.  Assessment/Plan: 1.Metastatic breast cancer - initially diagnosed with breast cancer in 1996 status post left mastectomy and axillary lymph node dissection. She developed a chest  wall recurrence in 2001 and completed radiation. In April 2003, CT showed mediastinal, internal mammary, and right axillary adenopathy and two liver lesions. MRI of the thoracic spine June 18, 2002 revealed osseous abnormalities at T3 and T4 suspicious for metastatic disease with extension into the T3 pedicle. There was extra osseous extension compressing and displacing the thoracic cord. She completed radiation from T1 through T6 June 21, 2002 through July 13, 2002. CT scan Nov 11, 2002 showed progression of metastatic disease with precardiac nodes, left pleural and lower lobe disease, subcutaneous nodules at the left anterior chest wall and progressive metastatic disease involving the liver. She began Femara in May/early June 2004 with subsequent resolution of chest wall nodules. CT scans January 14, 2005, of the chest and abdomen revealed findings of decreased pleural-based metastatic disease in the lower left hemithorax and no new or progressive disease in the chest. Small lesion in the right liver measuring 13.0 x 10.0 mm was reported as stable when compared to her previous study. No other liver lesions were seen. Bone scan January 22, 2006 showed degenerative changes. She discontinued Femara February 2009 and was lost a followup. She presented in March 2010 with an ulcerated chest wall lesion and an elevated CA27.29. Restaging CTs September 18, 2008 showed a 1.3 x 2.2 cm subdermal lesion at the left anterior chest, multiple enlarged metastases in the left hemithorax at the mediastinum and pleura felt to represent necrotic lymph nodes versus pleural metastases. Right axillary lymph node was decreased in size compared to  2006. There were no suspicious bone lesions. A previously noted lesion in the inferior right liver was not clearly seen. She resumed Femara March 2010. On October 23, 2008 the chest wall lesion had improved. The CA27.29 on October 23, 2008 was stable at 60. She discontinued Femara in June 2010 and  CTs of the abdomen and pelvis April 06, 2009 showed slight decrease in the size of a pleural-based nodule in the left lower lobe. There was no other evidence for metastatic disease in the abdomen or pelvis. She resumed Femara on April 11, 2009. CA27.29 returned at 87 on April 11, 2009. CT of the abdomen and pelvis on May 30, 2009 showed an increase in multiple pericardial masses, increased left chest wall subcutaneous nodule, stable pleural-based metastases and stable liver lesions. She discontinued Femara in November 2010 due to nausea and vomiting. Femara was resumed following an office visit June 06, 2009. Dr. Myrle Sheng reviewed a CT from May 30, 2009 with a [name] radiologist comparing the scan to scans from March 2010 and October 2010. In addition, a CT scan from 2006 was reviewed. The pleural-based lesions in the left chest had not changed significantly since March 2010. The pericardial masses appeared slightly larger. Both the pericardial masses and pleural-based masses were larger compared to the CT from 2006. She discontinued Femara January 2011. The left chest wall nodule recurred during the time she was off of Femara. Femara was resumed following an office visit 10/02/2009. The nodule again improved. Restaging CT of the chest 08/16/2010 was stable compared to a CT from 03/29/2010. Most recent CA 27-29 on 08/29/2011 was stable at 366. She continues Femara.   2. Ulcerated left chest lesion, likely a metastatic lesion. Improved.  3. Severe right low back/buttock pain radiating to the left leg October 2010 with MRI on April 06, 2009 revealing a disk protrusion at L5-S1 displacing the right S1 nerve root. She was evaluated by Dr. Jordan Likes. The pain has resolved.  4. Hospitalization December 1 through May 31, 2009 with nausea, vomiting and abdominal pain - resolved.  5. 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  6. Pain at  the right back/ iliac region October 25, 2008.  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  8. Status post left knee replacement.  9. Chronic edema of the left lower leg.  10. "Migraines". She is taking Imitrex.  11. Falls and balance problems occurring over the past year. She has been referred to neurology.  12. Right breast with area of firmness on exam March 2013 -negative right breast mammogram and ultrasound 09/15/2011 13. Pain at the left scapula-? Related to the recent trauma when she hit her head on a door. She will contact us if the pain does not improve over the next week and we will consider an imaging study.   Disposition:  Her overall status is stable. No evidence for progression of the metastatic breast cancer. She will return for an office visit and CA 27.29 in 4 months. [name] [name] will continue Femara.   Thornton Papas, MD  01/16/2012  10:00 AM

## 2012-01-16 NOTE — Telephone Encounter (Signed)
appts made and printed for pt aom °

## 2012-01-19 ENCOUNTER — Encounter (HOSPITAL_COMMUNITY): Payer: Self-pay | Admitting: *Deleted

## 2012-01-19 ENCOUNTER — Emergency Department (HOSPITAL_COMMUNITY): Payer: PRIVATE HEALTH INSURANCE

## 2012-01-19 ENCOUNTER — Emergency Department (HOSPITAL_COMMUNITY)
Admission: EM | Admit: 2012-01-19 | Discharge: 2012-01-19 | Disposition: A | Discharge status: Home Health | Payer: PRIVATE HEALTH INSURANCE | Attending: Emergency Medicine | Admitting: Emergency Medicine

## 2012-01-19 DIAGNOSIS — M549 Dorsalgia, unspecified: Secondary | ICD-10-CM

## 2012-01-19 DIAGNOSIS — Z853 Personal history of malignant neoplasm of breast: Secondary | ICD-10-CM | POA: Insufficient documentation

## 2012-01-19 DIAGNOSIS — J984 Other disorders of lung: Secondary | ICD-10-CM | POA: Insufficient documentation

## 2012-01-19 DIAGNOSIS — Z9089 Acquired absence of other organs: Secondary | ICD-10-CM | POA: Insufficient documentation

## 2012-01-19 DIAGNOSIS — M25519 Pain in unspecified shoulder: Secondary | ICD-10-CM | POA: Insufficient documentation

## 2012-01-19 DIAGNOSIS — I10 Essential (primary) hypertension: Secondary | ICD-10-CM | POA: Insufficient documentation

## 2012-01-19 DIAGNOSIS — F17200 Nicotine dependence, unspecified, uncomplicated: Secondary | ICD-10-CM | POA: Insufficient documentation

## 2012-01-19 DIAGNOSIS — E785 Hyperlipidemia, unspecified: Secondary | ICD-10-CM | POA: Insufficient documentation

## 2012-01-19 DIAGNOSIS — J45909 Unspecified asthma, uncomplicated: Secondary | ICD-10-CM | POA: Insufficient documentation

## 2012-01-19 LAB — POCT I-STAT, CHEM 8
BUN: 12 mg/dL (ref 6–23)
Chloride: 107 mEq/L (ref 96–112)
Potassium: 3.7 mEq/L (ref 3.5–5.1)
Sodium: 143 mEq/L (ref 135–145)

## 2012-01-19 MED ORDER — IOHEXOL 300 MG/ML  SOLN
80.0000 mL | Freq: Once | INTRAMUSCULAR | Status: AC | PRN
  Administered 2012-01-19: 80 mL via INTRAVENOUS

## 2012-01-19 MED ORDER — OXYCODONE-ACETAMINOPHEN 5-325 MG PO TABS
2.0000 | ORAL_TABLET | Freq: Once | ORAL | Status: AC
  Administered 2012-01-19: 2 via ORAL
  Filled 2012-01-19: qty 2

## 2012-01-19 MED ORDER — DIAZEPAM 5 MG/ML IJ SOLN
5.0000 mg | Freq: Once | INTRAMUSCULAR | Status: AC
  Administered 2012-01-19: 5 mg via INTRAMUSCULAR
  Filled 2012-01-19: qty 2

## 2012-01-19 MED ORDER — DIAZEPAM 5 MG PO TABS: 5.0000 mg | ORAL_TABLET | Freq: Two times a day (BID) | ORAL | Status: AC

## 2012-01-19 NOTE — ED Provider Notes (Signed)
[name] [name] is a 66 y.o. female here for upper back pain.  She is improved after treatment here.  Evaluation for cause the pain is negative. Doubt Cancer, fracture, pneumonia, or pneumothorax.  The patient is alert, cooperative.   Normal respiratory effort    Medical screening examination/treatment/procedure(s) were performed by non-physician practitioner and as supervising physician I was immediately available for consultation/collaboration.  Flint Melter, MD 01/19/12 [phone]

## 2012-01-19 NOTE — ED Notes (Signed)
Returned from radiology. 

## 2012-01-19 NOTE — ED Notes (Signed)
Patient states that she had onset of back pain last week. Pt states she went to her cancer MD on Friday and mentioned the pain and he said she may need an x-ray.  Pt denies any known injury.  PT states that pain is getting increasingly worse.  Pt complaining of left upper back pain that radiates down her back.  Pt denies any n/v at this time.  Pt states that she has increase in pain when she walks.  Pt states that she can't get comfortable.

## 2012-01-19 NOTE — ED Notes (Signed)
Patients family member called and states that patient has called her and upset that she has not been seen.  Family was updated that RN had just checked on patient and that RN had updated patient and given her a warm blanket.  Family verbalized understanding.

## 2012-01-19 NOTE — ED Provider Notes (Signed)
History     CSN: [id number]  Arrival date & time 01/19/12  0905   First MD Initiated Contact with Patient 01/19/12 1001      Chief Complaint  Patient presents with  . Back Pain    (Consider location/radiation/quality/duration/timing/severity/associated sxs/prior treatment) HPI Comments: Patient with a history of metastatic breast cancer comes in today with a chief complaint of pain to her left scapula and thoracic spine.  Onset of pain 5 days ago.  No acute injury or trauma.  She is currently followed by Dr. Truett Perna for her breast cancer and is currently on Femara.  She was recently seen by Dr. Alcide Evener 3 days ago.  She reports that during that appointment she told his about the pain of the scapula and she was told to return if the pain persisted.  She has not followed up with Dr. Truett Perna since then.    Patient is a 66 y.o. female presenting with back pain. The history is provided by the patient.  Back Pain  This is a new problem. Episode onset: 5 days ago. The problem occurs constantly. The problem has been gradually worsening. The pain is associated with no known injury. The pain is present in the thoracic spine. The pain does not radiate. Pertinent negatives include no chest pain, no fever, no numbness, no abdominal pain, no bowel incontinence, no perianal numbness, no bladder incontinence, no paresthesias, no paresis, no tingling and no weakness. Treatments tried: Vicodin. The treatment provided mild relief.    Past Medical History  Diagnosis Date  . Cancer   . Hyperlipidemia   . Hypertension   . Asthma     best PEF=400  . 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 Chronic low back pain     MRI 2010 showed right sided disease with paracentral  disc protusion at L5-S1 which contacts and displaces the right S1 nerve root within the lateral recess  . Migraine headache     MRI 11/18/10: Mild  progression of prominent white matter type changes which may be related to a small vessel disease and / or migraine headaches.Other causes for white matter type changes such as that secondaryto; vasculitis, inflammatory process or demyelinating process feltto be secondary considerations.  . Leg swelling   . Neuralgia and neuritis   . Tenosynovitis     left, MRI 05/2003  . Herniated disc     h/o ruptured disk, laminectomy L4-L5, Dr. Montez Morita  . Breast cancer     f/b Dr. Myrle Sheng. s/p masectomy +radation. w/ metastatic disease, now on Femara.  . Tobacco abuse     Past Surgical History  Procedure Date  . Abdominal hysterectomy   . Cesarean section   . Mastectomy   . Laminectomy     L4-L5  . Appendectomy   . Joint replacement     Left Knee  . Back surgery   . Breast surgery   . Amputation 11/26/2011    Procedure: AMPUTATION DIGIT;  Surgeon: Kennieth Rad, MD;  Location: [facility] OR;  Service: Orthopedics;  Laterality: Bilateral;  PHALANGECTOMY  - Right fifth toe and left third toe    Family History  Problem Relation Age of Onset  . Anesthesia problems Neg Hx   . Hypotension Neg Hx   . Malignant hyperthermia Neg Hx   . Pseudochol deficiency Neg Hx     History  Substance Use Topics  . Smoking status: Current Everyday Smoker -- 0.5 packs/day    Types: Cigarettes  . Smokeless tobacco: Never Used   Comment: smoking a little less  . Alcohol Use: No    OB History    Grav Para Term Preterm Abortions TAB SAB Ect Mult Living                  Review of Systems  Constitutional: Negative for fever and chills.  HENT: Negative for neck pain and neck stiffness.   Respiratory: Negative for shortness of breath.   Cardiovascular: Negative for chest pain.  Gastrointestinal: Negative for nausea, vomiting, abdominal pain and bowel incontinence.  Genitourinary: Negative for bladder incontinence.  Musculoskeletal: Positive for back pain. Negative for gait problem.  Skin: Negative for color  change and rash.  Neurological: Negative for tingling, weakness, numbness and paresthesias.    Allergies  Banana  Home Medications   Current Outpatient Rx  Name Route Sig Dispense Refill  . ALBUTEROL SULFATE HFA 108 (90 BASE) MCG/ACT IN AERS Inhalation Inhale 2 puffs into the lungs every 6 (six) hours as needed. Shortness of breath 1 Inhaler 3  . HYDROCODONE-ACETAMINOPHEN 5-500 MG PO TABS Oral Take 1 tablet by mouth every 4 (four) hours as needed for pain. 30 tablet 0  . LETROZOLE 2.5 MG PO TABS Oral Take 2.5 mg by mouth daily.    . SUMATRIPTAN SUCCINATE 50 MG PO TABS Oral Take 50 mg by mouth daily as needed. For migraines      BP 147/80  Pulse 82  Temp 97.4 F (36.3 C)  Resp 18  SpO2 98%  Physical Exam  Nursing note and vitals reviewed. Constitutional: She appears well-developed and well-nourished.  HENT:  Head: Normocephalic and atraumatic.  Neck: Normal range of motion. Neck supple.  Cardiovascular: Normal rate, regular rhythm and normal heart sounds.   Pulses:      Radial pulses are 2+ on the right side, and 2+ on the left side.  Pulmonary/Chest: Effort normal and breath sounds normal.  Musculoskeletal: Normal range of motion. She exhibits no edema and no tenderness.       Cervical back: She exhibits no tenderness, no bony tenderness, no swelling, no edema and no deformity.       Thoracic back: She exhibits tenderness and bony tenderness. She exhibits no swelling, no edema and no deformity.       Lumbar back: She exhibits no tenderness, no bony tenderness, no swelling, no edema and no deformity.       Arms:      Pain with ROM of the left shoulder Tenderness to palpation of the left scapula.  No tenderness to palpation of the anterior shoulder.    Neurological: She is alert. She has normal strength. No sensory deficit. Gait normal.  Reflex Scores:      Patellar reflexes are 2+ on the right side and 2+ on the left side.      Achilles reflexes are 2+ on the right side and  2+ on the left side. Skin: Skin is warm and dry. No rash noted. No erythema.  Psychiatric: She has a normal mood and affect.    ED Course  Procedures (including critical care time)  Labs Reviewed - No data to display Dg Thoracic Spine 2 View  01/19/2012  *RADIOLOGY REPORT*  Clinical Data: Left shoulder and scapular pain.  History of breast cancer.  THORACIC SPINE - 2 VIEW  Comparison: Plain films of the chest 11/14/2011  and CT chest 08/16/2010.  Findings: No fracture is identified.  Prominent Schmorl's nodes in the superior and inferior endplates of T3 are unchanged.  Alignment is normal.  Marked elevation of the left hemidiaphragm is identified.  Mass lesion in the left upper lobe is seen as on the prior examinations.  IMPRESSION:  1.  No acute abnormality of the thoracic spine.  Stable compared to prior exams. 2.  Left upper lobe mass lesion is seen as on prior examinations. 3.  Marked elevation of the left hemidiaphragm, unchanged.  Original Report Authenticated By: Bernadene Bell. Maricela Curet, M.D.   Ct Chest W Contrast  01/19/2012  *RADIOLOGY REPORT*  Clinical Data: Left chest mass on radiographs.  History of breast cancer.  CT CHEST WITH CONTRAST  Technique:  Multidetector CT imaging of the chest was performed following the standard protocol during bolus administration of intravenous contrast.  Contrast: 80mL OMNIPAQUE IOHEXOL 300 MG/ML  SOLN  Comparison: Radiographs same date.  Chest CT 08/16/2010 and 03/29/2010.  Findings: Initial images are degraded by artifact from an apparent external breast prosthesis on the left.  Some images were repeated after removal of this prosthesis.  As demonstrated previously, there is a large superior mediastinal mass on the left.  The posterior component of this superior to the aortic arch has enlarged.  This now measures approximately 4.5 cm on image 14 (2.7 cm previously).  More anterior component measures 6.7 x 3.8 cm on image 16 (previously 5.8 x 3.6 cm).  The  multiple pleural based nodules inferiorly in the left hemithorax have little changed.  A lesion anteriorly measures 3.6 cm on image 79, and a posterior lesion measures 3.1 cm on image 76.  Previously demonstrated nodule involving the left major fissure has decreased in size, now measuring 7 mm on image 22 (previously 14 mm).  The lungs otherwise appear stable with scattered atelectasis on the left.  There is no significant pleural or pericardial effusion.  There are no significantly enlarged mediastinal, hilar or axillary lymph nodes.  There is no left axillary mass status post lymph node dissection.  Small soft tissue nodule in the anterior left chest wall is stable, measuring 1.8 cm on image 68.  T3 and T4 compression deformities appear stable.  No acute fracture or epidural tumor is apparent.  IMPRESSION:  1.  Interval enlargement of the superior components of the left pleural metastatic disease. The inferior components have little changed. 2.  No intraparenchymal metastasis or significant pleural effusion. 3.  Stable soft tissue nodule in the left anterior chest wall.  Original Report Authenticated By: Gerrianne Scale, M.D.   Dg Shoulder Left  01/19/2012  *RADIOLOGY REPORT*  Clinical Data: Shoulder pain.  No history of trauma.  LEFT SHOULDER - 2+ VIEW  Comparison: MRI left shoulder 09/16/2010 and plain films 09/05/2010.  Findings: Postoperative change of rotator cuff repair and axillary dissection again seen.  The humerus is located and there is no fracture. Acromioclavicular degenerative disc disease appears unchanged.  Left upper lobe mass lesion is again seen as on prior studies.  IMPRESSION:  1.  No acute finding. 2.  Left upper lobe mass again as seen on prior studies. 3.  Status post rotator cuff repair and left axillary dissection.  Original Report Authenticated By: Bernadene Bell. Maricela Curet, M.D.     No diagnosis found.  Discussed with Dr. Truett Perna.  He recommends getting a bone scan if  possible.  Discussed with Radiologist.  He states that it would be best to  get a CT chest with contrast and have a bone scan done on an outpatient basis.  MDM  Patient with history of metastatic left breast cancer presenting with pain to her thoracic spine and her left scapula.  Patient followed by Dr. Truett Perna with Oncology.  Xrays today negative.  CT chest shows an interval enlargement of the left pleural metastatic disease and a stable soft tissue nodule in the left anterior chest wall.  Patient's pain improved with medication and she was requesting discharge.  Patient instructed to follow up with Dr. Truett Perna to have bone scan scheduled.          Pascal Lux Maxwell, PA-C 01/20/12 2300

## 2012-01-20 ENCOUNTER — Telehealth: Payer: Self-pay | Admitting: *Deleted

## 2012-01-20 NOTE — Telephone Encounter (Signed)
Called pt with tumor marker results. She voiced understanding. Stated ED doctor told her Dr. Truett Perna suggested bone scan, they were unable to get this done during ED visit. Asking if Dr. Truett Perna will order this.  Reviewed with Dr. Truett Perna: MD will order.

## 2012-01-20 NOTE — Telephone Encounter (Signed)
Message copied by Caleb Popp on Tue Jan 20, 2012 11:31 AM ------      Message from: Thornton Papas B      Created: Mon Jan 19, 2012 10:10 PM       Please call patient, [phone] is stable

## 2012-01-21 ENCOUNTER — Other Ambulatory Visit: Payer: Self-pay | Admitting: Oncology

## 2012-01-21 ENCOUNTER — Telehealth: Payer: Self-pay | Admitting: *Deleted

## 2012-01-21 DIAGNOSIS — Z853 Personal history of malignant neoplasm of breast: Secondary | ICD-10-CM

## 2012-01-21 NOTE — Telephone Encounter (Signed)
gave patient appointment for 01-30-2012 at 9:00am for total body bone scan patient confirmed over the phone

## 2012-01-23 NOTE — ED Provider Notes (Signed)
Medical screening examination/treatment/procedure(s) were conducted as a shared visit with non-physician practitioner(s) and myself.  I personally evaluated the patient during the encounter  Flint Melter, MD 01/23/12 [phone]

## 2012-01-30 ENCOUNTER — Ambulatory Visit (HOSPITAL_COMMUNITY): Payer: PRIVATE HEALTH INSURANCE

## 2012-01-30 ENCOUNTER — Encounter (HOSPITAL_COMMUNITY): Payer: PRIVATE HEALTH INSURANCE

## 2012-02-23 ENCOUNTER — Emergency Department (HOSPITAL_COMMUNITY)
Admission: EM | Admit: 2012-02-23 | Discharge: 2012-02-23 | Disposition: A | Discharge status: Home / Self Care | Payer: PRIVATE HEALTH INSURANCE | Attending: Emergency Medicine | Admitting: Emergency Medicine

## 2012-02-23 ENCOUNTER — Telehealth: Payer: Self-pay | Admitting: *Deleted

## 2012-02-23 ENCOUNTER — Emergency Department (HOSPITAL_COMMUNITY): Payer: PRIVATE HEALTH INSURANCE

## 2012-02-23 ENCOUNTER — Encounter (HOSPITAL_COMMUNITY): Payer: Self-pay | Admitting: Family Medicine

## 2012-02-23 DIAGNOSIS — J45909 Unspecified asthma, uncomplicated: Secondary | ICD-10-CM | POA: Insufficient documentation

## 2012-02-23 DIAGNOSIS — M545 Low back pain, unspecified: Secondary | ICD-10-CM | POA: Insufficient documentation

## 2012-02-23 DIAGNOSIS — M546 Pain in thoracic spine: Secondary | ICD-10-CM | POA: Insufficient documentation

## 2012-02-23 DIAGNOSIS — C782 Secondary malignant neoplasm of pleura: Secondary | ICD-10-CM | POA: Insufficient documentation

## 2012-02-23 DIAGNOSIS — F17200 Nicotine dependence, unspecified, uncomplicated: Secondary | ICD-10-CM | POA: Insufficient documentation

## 2012-02-23 DIAGNOSIS — E785 Hyperlipidemia, unspecified: Secondary | ICD-10-CM | POA: Insufficient documentation

## 2012-02-23 DIAGNOSIS — G8929 Other chronic pain: Secondary | ICD-10-CM | POA: Insufficient documentation

## 2012-02-23 DIAGNOSIS — M792 Neuralgia and neuritis, unspecified: Secondary | ICD-10-CM

## 2012-02-23 DIAGNOSIS — I10 Essential (primary) hypertension: Secondary | ICD-10-CM | POA: Insufficient documentation

## 2012-02-23 DIAGNOSIS — C50919 Malignant neoplasm of unspecified site of unspecified female breast: Secondary | ICD-10-CM | POA: Insufficient documentation

## 2012-02-23 MED ORDER — HYDROMORPHONE HCL 4 MG PO TABS: 4.0000 mg | ORAL_TABLET | ORAL | Status: DC | PRN

## 2012-02-23 MED ORDER — HYDROMORPHONE HCL PF 1 MG/ML IJ SOLN
1.0000 mg | Freq: Once | INTRAMUSCULAR | Status: AC
  Administered 2012-02-23: 1 mg via INTRAVENOUS
  Filled 2012-02-23: qty 1

## 2012-02-23 NOTE — ED Notes (Signed)
Per pt upper back pain that is burning. sts has been going on for a while and getting worse. sts also she is having chest pain now. Denies any other symptoms.

## 2012-02-23 NOTE — ED Notes (Addendum)
Pt presents to department for evaluation of upper back pain. Ongoing x1 month. Describes pain as "burning" sensation. 10/10 at the time. Able to move all extremities without difficulty. Denies recent injury. She is alert and oriented x4. No signs of distress noted. Also states midsternal chest pain.

## 2012-02-23 NOTE — ED Provider Notes (Addendum)
History     CSN: [id number]  Arrival date & time 02/23/12  7829   First MD Initiated Contact with Patient 02/23/12 1003      Chief Complaint  Patient presents with  . Back Pain    (Consider location/radiation/quality/duration/timing/severity/associated sxs/prior treatment) Patient is a 66 y.o. female presenting with back pain. The history is provided by the patient.  Back Pain  Pertinent negatives include no chest pain, no fever, no headaches and no abdominal pain.   66 year old, female, presents to emergency department complaining of chronic upper left-sided back pain.  She has not had trauma.  She says the pain is so severe that she is not able to get comfortable.  She denies nausea, vomiting, fevers, chills, weakness, rash.  She has not had a cough, or shortness of breath.  She was seen in the emergency department for this previously, but no etiology was given for her symptoms.  She has a history of breast cancer.  Past Medical History  Diagnosis Date  . Cancer   . Hyperlipidemia   . Hypertension   . Asthma     best PEF=400  . 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 Chronic low back pain     MRI 2010 showed right sided disease with paracentral  disc protusion at L5-S1 which contacts and displaces the right S1 nerve root within the lateral recess  . Migraine headache     MRI 11/18/10: Mild progression of prominent white matter type changes which may be related to a small vessel disease and / or migraine headaches.Other causes for white matter type changes such as that secondaryto; vasculitis, inflammatory process or demyelinating process feltto be secondary considerations.  . Leg swelling   . Neuralgia and neuritis   . Tenosynovitis     left, MRI 05/2003  . Herniated disc     h/o ruptured disk, laminectomy L4-L5, Dr. Montez Morita  . Breast cancer     f/b Dr. Myrle Sheng. s/p masectomy +radation. w/  metastatic disease, now on Femara.  . Tobacco abuse     Past Surgical History  Procedure Date  . Abdominal hysterectomy   . Cesarean section   . Mastectomy   . Laminectomy     L4-L5  . Appendectomy   . Joint replacement     Left Knee  . Back surgery   . Breast surgery   . Amputation 11/26/2011    Procedure: AMPUTATION DIGIT;  Surgeon: Kennieth Rad, MD;  Location: [facility] OR;  Service: Orthopedics;  Laterality: Bilateral;  PHALANGECTOMY  - Right fifth toe and left third toe    Family History  Problem Relation Age of Onset  . Anesthesia problems Neg Hx   . Hypotension Neg Hx   . Malignant hyperthermia Neg Hx   . Pseudochol deficiency Neg Hx     History  Substance Use Topics  . Smoking status: Current Everyday Smoker -- 0.5 packs/day    Types: Cigarettes  . Smokeless tobacco: Never Used   Comment: smoking a little less  . Alcohol Use: No    OB History    Grav Para Term Preterm Abortions TAB SAB Ect Mult Living                  Review of Systems  Constitutional: Negative for fever and chills.  HENT: Negative for neck pain.   Respiratory: Negative  for cough and shortness of breath.   Cardiovascular: Negative for chest pain.  Gastrointestinal: Negative for nausea, vomiting and abdominal pain.  Musculoskeletal: Positive for back pain.  Skin: Negative for rash.  Neurological: Negative for headaches.  Hematological: Does not bruise/bleed easily.  All other systems reviewed and are negative.    Allergies  Banana  Home Medications   Current Outpatient Rx  Name Route Sig Dispense Refill  . ALBUTEROL SULFATE HFA 108 (90 BASE) MCG/ACT IN AERS Inhalation Inhale 2 puffs into the lungs every 6 (six) hours as needed. Shortness of breath 1 Inhaler 3  . HYDROCODONE-ACETAMINOPHEN 5-500 MG PO TABS Oral Take 1 tablet by mouth every 4 (four) hours as needed for pain. 30 tablet 0  . LETROZOLE 2.5 MG PO TABS Oral Take 2.5 mg by mouth daily.    . SUMATRIPTAN SUCCINATE 50 MG PO  TABS Oral Take 50 mg by mouth daily as needed. For migraines      BP 145/69  Pulse 79  Temp 98.2 F (36.8 C)  Resp 16  SpO2 95%  Physical Exam  Nursing note and vitals reviewed. Constitutional: She is oriented to person, place, and time. She appears well-developed and well-nourished. No distress.       Uncomfortable appearing  HENT:  Head: Normocephalic and atraumatic.  Eyes: Conjunctivae and EOM are normal.  Neck: Normal range of motion. Neck supple.  Pulmonary/Chest: Effort normal.  Abdominal: She exhibits no distension.  Musculoskeletal: Normal range of motion.  Neurological: She is alert and oriented to person, place, and time.  Skin: Skin is warm and dry. No rash noted.       On her back on the left side of the spinal column from her shoulder to the tip is in the scapula.  She is very sensitive skin with severe tenderness to palpation.  However, there is no evidence of erythema, cellulitis, rash, or subcutaneous abscess.  skin appears to be normal.  Overlying this area in of tenderness  Psychiatric: She has a normal mood and affect. Thought content normal.    ED Course  Procedures (including critical care time) chronic upper left-sided back pain, with tenderness to palpation  Labs Reviewed - No data to display No results found.   No diagnosis found.  12:38 PM sxs improved  ECG NSR at 84 BPM Nl axis Nl intervals Nonspecific tw changes   MDM  Upper left-sided back pain Progressive metastatic breast ca with nerve root compression        Cheri Guppy, MD 02/23/12 1238  Cheri Guppy, MD 03/17/12 [phone]

## 2012-02-23 NOTE — ED Notes (Signed)
Pt reports her chest pain started yesterday, subsided returned today when her back pain began increasing, pt reports her chest pain has subsided since arriving to ED. Pt denies N/V/D, abd pain, sob, or dizziness

## 2012-02-23 NOTE — Telephone Encounter (Signed)
Message from pt reporting she was seen in the ED for back pain and was told to see Dr. Truett Perna ASAP. Reviewed with Dr. Truett Perna: Order received for appt 02/24/12. Called pt with appt. She will try to arrange transportation.

## 2012-02-23 NOTE — ED Notes (Signed)
Pt transported to x-ray at the time.

## 2012-02-24 ENCOUNTER — Ambulatory Visit (HOSPITAL_BASED_OUTPATIENT_CLINIC_OR_DEPARTMENT_OTHER): Payer: Medicaid Other | Admitting: Oncology

## 2012-02-24 VITALS — BP 158/76 | HR 70 | Temp 97.5°F | Resp 20 | Ht 68.0 in | Wt 213.0 lb

## 2012-02-24 DIAGNOSIS — Z853 Personal history of malignant neoplasm of breast: Secondary | ICD-10-CM

## 2012-02-24 DIAGNOSIS — R52 Pain, unspecified: Secondary | ICD-10-CM

## 2012-02-24 DIAGNOSIS — C50919 Malignant neoplasm of unspecified site of unspecified female breast: Secondary | ICD-10-CM

## 2012-02-24 DIAGNOSIS — R222 Localized swelling, mass and lump, trunk: Secondary | ICD-10-CM

## 2012-02-24 DIAGNOSIS — C781 Secondary malignant neoplasm of mediastinum: Secondary | ICD-10-CM

## 2012-02-24 DIAGNOSIS — C787 Secondary malignant neoplasm of liver and intrahepatic bile duct: Secondary | ICD-10-CM

## 2012-02-24 MED ORDER — OXYCODONE-ACETAMINOPHEN 5-325 MG PO TABS
2.0000 | ORAL_TABLET | Freq: Once | ORAL | Status: AC
  Administered 2012-02-24: 2 via ORAL

## 2012-02-24 MED ORDER — DEXAMETHASONE 4 MG PO TABS: 4.0000 mg | ORAL_TABLET | Freq: Two times a day (BID) | ORAL | Status: AC

## 2012-02-24 MED ORDER — OXYCODONE HCL 10 MG PO TABS: 10.0000 mg | ORAL_TABLET | ORAL | Status: DC | PRN

## 2012-02-24 NOTE — Patient Instructions (Addendum)
[name] [name] August 06, 1945 [id number]  [facility] Discharge Instructions  Your exam findings, labs and results were discussed with your MD today.  Filed Vitals:   02/24/12 1029  BP: 158/76  Pulse: 70  Temp: 97.5 F (36.4 C)  Resp: 20    Current Outpatient Prescriptions on File Prior to Visit  Medication Sig Dispense Refill  . albuterol (PROVENTIL HFA;VENTOLIN HFA) 108 (90 BASE) MCG/ACT inhaler Inhale 2 puffs into the lungs every 6 (six) hours as needed. Shortness of breath  1 Inhaler  3  . HYDROcodone-acetaminophen (VICODIN) 5-500 MG per tablet Take 1 tablet by mouth every 4 (four) hours as needed for pain.  30 tablet  0  . SUMAtriptan (IMITREX) 50 MG tablet Take 50 mg by mouth daily as needed. For migraines       No current facility-administered medications on file prior to visit.    Please visit scheduling to obtain calendar for future appointments.  Please call the [facility] at [phone] during business hours should you have any further questions or need assistance in obtaining follow-up care. If you have a medical emergency, please dial 911.  Special Instructions:

## 2012-02-24 NOTE — Progress Notes (Signed)
[name] [name]    OFFICE PROGRESS NOTE   INTERVAL HISTORY:   She returns prior to a scheduled visit. She has been taking Femara daily.  [name] [name] reports pain at the upper back and left posterior lateral chest for the past one month. The pain increased in intensity and she presented to the emergency room yesterday. A CT of the chest confirmed enlargement of the superior mediastinal mass. Multiple pleural based nodules in the left hemithorax have not changed significantly. No pleural or pericardial effusion. Small soft tissue nodule at the anterior left chest wall is stable. T3 and T4 compression deformities are stable. No acute fracture or epidural tumor. The pleural-based tumor superiorly in the left hemithorax appeared to erode the T3 vertebral body on the left and potentially extends into the left foramen causing mass affecting the exiting nerve root.  She denies focal numbness or weakness. No difficulty with bowel or bladder control. No pain at other sites.   Objective:  Vital signs in last 24 hours:  Blood pressure 158/76, pulse 70, temperature 97.5 F (36.4 C), temperature source Oral, resp. rate 20, height 5\' 8"  (1.727 m), weight 213 lb (96.616 kg).    HEENT: Neck without mass Lymphatics: No cervical, supraclavicular, or axillary nodes Resp: Lungs clear bilaterally with decreased breath sounds over the left chest. No respiratory distress Cardio: Regular rate and rhythm GI: No hepatomegaly Vascular: No leg edema Neuro: The motor exam appears intact in the upper and lower extremities  Skin: Approximate 2 cm crusted nodular cutaneous lesion at the medial aspect of the mastectomy scar  Musculoskeletal: There is tenderness at the upper thoracic spine   Lab Results: CA 27.29 on 01/16/2012-344, 366 on 08/29/2011.    Medications: I have reviewed the patient's current medications.  Assessment/Plan: 1.Metastatic breast cancer - initially diagnosed with breast  cancer in 1996 status post left mastectomy and axillary lymph node dissection. She developed a chest wall recurrence in 2001 and completed radiation. In April 2003, CT showed mediastinal, internal mammary, and right axillary adenopathy and two liver lesions. MRI of the thoracic spine June 18, 2002 revealed osseous abnormalities at T3 and T4 suspicious for metastatic disease with extension into the T3 pedicle. There was extra osseous extension compressing and displacing the thoracic cord. She completed radiation from T1 through T6 June 21, 2002 through July 13, 2002. CT scan Nov 11, 2002 showed progression of metastatic disease with precardiac nodes, left pleural and lower lobe disease, subcutaneous nodules at the left anterior chest wall and progressive metastatic disease involving the liver. She began Femara in May/early June 2004 with subsequent resolution of chest wall nodules. CT scans January 14, 2005, of the chest and abdomen revealed findings of decreased pleural-based metastatic disease in the lower left hemithorax and no new or progressive disease in the chest. Small lesion in the right liver measuring 13.0 x 10.0 mm was reported as stable when compared to her previous study. No other liver lesions were seen. Bone scan January 22, 2006 showed degenerative changes. She discontinued Femara February 2009 and was lost a followup. She presented in March 2010 with an ulcerated chest wall lesion and an elevated CA27.29. Restaging CTs September 18, 2008 showed a 1.3 x 2.2 cm subdermal lesion at the left anterior chest, multiple enlarged metastases in the left hemithorax at the mediastinum and pleura felt to represent necrotic lymph nodes versus pleural metastases. Right axillary lymph node was decreased in size compared to 2006. There were no suspicious bone  lesions. A previously noted lesion in the inferior right liver was not clearly seen. She resumed Femara March 2010. On October 23, 2008 the chest wall lesion  had improved. The CA27.29 on October 23, 2008 was stable at 60. She discontinued Femara in June 2010 and CTs of the abdomen and pelvis April 06, 2009 showed slight decrease in the size of a pleural-based nodule in the left lower lobe. There was no other evidence for metastatic disease in the abdomen or pelvis. She resumed Femara on April 11, 2009. CA27.29 returned at 87 on April 11, 2009. CT of the abdomen and pelvis on May 30, 2009 showed an increase in multiple pericardial masses, increased left chest wall subcutaneous nodule, stable pleural-based metastases and stable liver lesions. She discontinued Femara in November 2010 due to nausea and vomiting. Femara was resumed following an office visit June 06, 2009. Dr. Myrle Sheng reviewed a CT from May 30, 2009 with a [name] radiologist comparing the scan to scans from March 2010 and October 2010. In addition, a CT scan from 2006 was reviewed. The pleural-based lesions in the left chest had not changed significantly since March 2010. The pericardial masses appeared slightly larger. Both the pericardial masses and pleural-based masses were larger compared to the CT from 2006. She discontinued Femara January 2011. The left chest wall nodule recurred during the time she was off of Femara. Femara was resumed following an office visit 10/02/2009. The nodule again improved. Restaging CT of the chest 08/16/2010 was stable compared to a CT from 03/29/2010. Most recent CA 27-29 on 01/16/2012 was stable at 340. Femara was continued. CT the chest 02/23/2012 with progressive pleural-based tumor in the left chest with probable involvement of T3 2. Ulcerated left chest lesion, likely a metastatic lesion. Stable 3. Severe right low back/buttock pain radiating to the left leg October 2010 with MRI on April 06, 2009 revealing a disk protrusion at L5-S1 displacing the right S1 nerve root. She was evaluated by Dr. Jordan Likes. The pain has resolved.  4. Hospitalization  December 1 through May 31, 2009 with nausea, vomiting and abdominal pain - resolved.  5. 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  6. Pain at the right back/ iliac region October 25, 2008.  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  8. Status post left knee replacement.  9. Chronic edema of the left lower leg.  10. "Migraines". She is taking Imitrex.  11. Falls and balance problems occurring over the past year. She has been referred to neurology.  12. Right breast with area of firmness on exam March 2013 -negative right breast mammogram and ultrasound 09/15/2011  13. Pain at upper back-likely related to progressive pleural-based tumor at the upper posterior chest., She will begin Decadron and oxycodone.  Disposition:  She has symptomatic disease progression at the upper posterior chest/spine. I reviewed the CT images with [name] [name]. She will begin a trial of Decadron. She will use oxycodone as needed for pain. [name] [name] is to contact us if she develops neurologic symptoms.  She has previously received radiation to the upper thoracic spine (2004). I discussed the case with Dr. Roselind Messier. She will likely be a candidate for additional radiation. She will be scheduled for an MRI of the thoracic spine within the next few days and Dr. Roselind Messier will see her this week.  [name] [name] will discontinue Femara. We will decide on salvage systemic therapy at the completion of radiation.  She will return for an office visit in approximately 2 weeks. She will contact us if the oxycodone does not relieve her pain.   Thornton Papas, MD  02/24/2012  11:16 AM

## 2012-02-27 ENCOUNTER — Telehealth: Payer: Self-pay | Admitting: *Deleted

## 2012-02-27 ENCOUNTER — Telehealth: Payer: Self-pay | Admitting: Oncology

## 2012-02-27 NOTE — Telephone Encounter (Signed)
lmonvm adviisng the pt of her mri appt on sept 6th and the appt to see dr Roselind Messier in rad onc

## 2012-02-27 NOTE — Telephone Encounter (Signed)
Received call from pt aide, [name] [name], stating that the "pain medicine Dr. Alcide Evener prescribed was too strong and she is having hallucinations."  Returned call and spoke with pt and she stated that she was taking the "Oxycodone every 4 hours and that was too much, I'm use to taking the Hydrocodone."  Pt stated that her aide instructed her to take "only half of the oxycodone today, and not any more"  When asked if pain relieved with taking half oxycodone, pt stated "yes, it"s doing fine"  Instructed pt only to take medicine if needed for pain, if not having any pain-no need to take.  Pt verbalized understanding and requested RN to call aide also.  Called and spoke with [name] [name], she re-iterated that pt was taking every 4 hours and was "too much"  Again, instructed aide pt should only take as needed for pain, not around the clock.  [name] verbalized understanding and voiced would call if any problems.

## 2012-03-02 ENCOUNTER — Other Ambulatory Visit: Payer: Self-pay | Admitting: *Deleted

## 2012-03-03 ENCOUNTER — Other Ambulatory Visit: Payer: Self-pay | Admitting: *Deleted

## 2012-03-03 DIAGNOSIS — Z853 Personal history of malignant neoplasm of breast: Secondary | ICD-10-CM

## 2012-03-05 ENCOUNTER — Encounter: Payer: Self-pay | Admitting: Radiation Oncology

## 2012-03-05 ENCOUNTER — Ambulatory Visit
Admission: RE | Admit: 2012-03-05 | Discharge: 2012-03-05 | Disposition: A | Discharge status: Home / Self Care | Payer: PRIVATE HEALTH INSURANCE | Source: Ambulatory Visit | Attending: Radiation Oncology | Admitting: Radiation Oncology

## 2012-03-05 ENCOUNTER — Other Ambulatory Visit: Payer: Self-pay | Admitting: Radiation Oncology

## 2012-03-05 ENCOUNTER — Other Ambulatory Visit: Payer: Self-pay | Admitting: Oncology

## 2012-03-05 ENCOUNTER — Ambulatory Visit (HOSPITAL_COMMUNITY)
Admission: RE | Admit: 2012-03-05 | Discharge: 2012-03-05 | Disposition: A | Discharge status: Home / Self Care | Payer: PRIVATE HEALTH INSURANCE | Source: Ambulatory Visit | Attending: Oncology | Admitting: Oncology

## 2012-03-05 VITALS — BP 160/85 | HR 70 | Temp 98.4°F | Resp 20 | Ht 68.0 in | Wt 217.5 lb

## 2012-03-05 DIAGNOSIS — Z853 Personal history of malignant neoplasm of breast: Secondary | ICD-10-CM

## 2012-03-05 DIAGNOSIS — C7951 Secondary malignant neoplasm of bone: Secondary | ICD-10-CM | POA: Insufficient documentation

## 2012-03-05 DIAGNOSIS — R52 Pain, unspecified: Secondary | ICD-10-CM

## 2012-03-05 DIAGNOSIS — C782 Secondary malignant neoplasm of pleura: Secondary | ICD-10-CM | POA: Insufficient documentation

## 2012-03-05 DIAGNOSIS — Z923 Personal history of irradiation: Secondary | ICD-10-CM | POA: Insufficient documentation

## 2012-03-05 DIAGNOSIS — C7952 Secondary malignant neoplasm of bone marrow: Secondary | ICD-10-CM | POA: Insufficient documentation

## 2012-03-05 DIAGNOSIS — C50919 Malignant neoplasm of unspecified site of unspecified female breast: Secondary | ICD-10-CM | POA: Insufficient documentation

## 2012-03-05 DIAGNOSIS — M8448XA Pathological fracture, other site, initial encounter for fracture: Secondary | ICD-10-CM | POA: Insufficient documentation

## 2012-03-05 DIAGNOSIS — Z510 Encounter for antineoplastic radiation therapy: Secondary | ICD-10-CM | POA: Insufficient documentation

## 2012-03-05 HISTORY — DX: Secondary malignant neoplasm of bone: C79.51

## 2012-03-05 HISTORY — DX: Personal history of irradiation: Z92.3

## 2012-03-05 MED ORDER — GADOBENATE DIMEGLUMINE 529 MG/ML IV SOLN
20.0000 mL | Freq: Once | INTRAVENOUS | Status: AC | PRN
  Administered 2012-03-05: 20 mL via INTRAVENOUS

## 2012-03-05 NOTE — Progress Notes (Signed)
Please see the Nurse Progress Note in the MD Initial Consult Encounter for this patient. 

## 2012-03-05 NOTE — Progress Notes (Signed)
Pt c/o Oxycodone 10 mg q 4hrs w/50 % pain relief. She describes "burning pain in left shoulder to neck", is painful to light pressure. Pt states she has had this pain x 2 mos, has gotten stronger over time. She does rest after taking pain med. Last took Oxycodone 11 am today.

## 2012-03-07 NOTE — Progress Notes (Signed)
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  HISTORY OF PRESENT ILLNESS::[name] [name] is a 66 y.o. female who previously received radiation to the thoracic spine from T1-T6 in 2003 under the care of one of my colleagues, Dr. Roselind Messier. She had metastatic breast cancer and has recently developed recurrence and progression of disease at the T3-T4 level as described in the MRI reports below. Given her previous radiation, the patient has kindly been referred today for consideration of possible stereotactic radiosurgery to the thoracic spine for palliation of pain and prevention of spinal cord injury. [name]  PREVIOUS RADIATION THERAPY: Yes as above she received 33.5 gray in 14 fractions to the radiation field encompassing T1-T6   PAST MEDICAL HISTORY:  has a past medical history of Cancer; Hyperlipidemia; Hypertension; Asthma; Meningitis; Chronic low back pain; Migraine headache; Leg swelling; Neuralgia and neuritis; Tenosynovitis; Herniated disc; Breast cancer (1996, 2001); Tobacco abuse; radiation therapy (06/21/02 -07/13/02); and Bone metastases.    PAST SURGICAL HISTORY: Past Surgical History  Procedure Date  . Abdominal hysterectomy   . Cesarean section   . Mastectomy   . Laminectomy     L4-L5  . Appendectomy   . Joint replacement     Left Knee  . Back surgery   . Breast surgery   . Amputation 11/26/2011    Procedure: AMPUTATION DIGIT;  Surgeon: Kennieth Rad, MD;  Location: [facility] OR;  Service: Orthopedics;  Laterality: Bilateral;  PHALANGECTOMY  - Right fifth toe and left third toe    FAMILY HISTORY: family history is negative for  Anesthesia problems, and Hypotension, and Malignant hyperthermia, and Pseudochol deficiency, .  SOCIAL HISTORY:  reports that she has been smoking Cigarettes.  She has been smoking about .5 packs per day. She has never used smokeless tobacco. She reports that she does not drink alcohol or use illicit drugs.  ALLERGIES: Banana  MEDICATIONS:  Current Outpatient Prescriptions  Medication Sig Dispense Refill  . albuterol (PROVENTIL HFA;VENTOLIN HFA) 108 (90 BASE) MCG/ACT inhaler Inhale 2 puffs into the lungs every 6 (six) hours as needed. Shortness of breath  1 Inhaler  3  . Oxycodone HCl 10 MG TABS Take 1 tablet (10 mg total) by mouth every 4 (four) hours as needed.  75 tablet  0  . SUMAtriptan (IMITREX) 50 MG tablet Take 50 mg by mouth daily as needed. For migraines      . HYDROcodone-acetaminophen (VICODIN) 5-500 MG per tablet Take 1 tablet by mouth every 4 (four) hours as needed for pain.  30 tablet  0  . oxyCODONE-acetaminophen (PERCOCET) 7.5-325 MG per tablet         REVIEW OF SYSTEMS:  A 15 point review of systems is documented in the electronic medical record. This was obtained by the nursing staff. However, I reviewed this with the patient to discuss relevant findings and make appropriate changes.  Pertinent items are noted in HPI. patient does have back pain currently radiating to the left shoulder blade.    PHYSICAL EXAM:  height is 5\' 8"  (1.727 m) and weight is 217 lb 8 oz (98.657 kg). Her oral temperature is 98.4 F (36.9 C).  Her blood pressure is 160/85 and her pulse is 70. Her respiration is 20.   The patient is in modest discomfort today relating her discomfort pain in the upper thoracic spine and shoulder blades radiating to the left shoulder blade. Her motor strength in lower chart is intact. Light touch is intact throughout. Neurologically, patient is grossly nonfocal.  LABORATORY DATA:  Lab Results  Component Value Date   WBC 5.0 01/16/2012   HGB 12.6 01/19/2012   HCT 37.0  01/19/2012   MCV 92.7 01/16/2012   PLT 211 01/16/2012   Lab Results  Component Value Date   NA 143 01/19/2012   K 3.7 01/19/2012   CL 107 01/19/2012   CO2 28 11/14/2011   Lab Results  Component Value Date   ALT 8 10/23/2010   AST 11 10/23/2010   ALKPHOS 86 10/23/2010   BILITOT 0.3 10/23/2010     RADIOGRAPHY: Dg Chest 2 View  02/23/2012  *RADIOLOGY REPORT*  Clinical Data: Left upper chest pain.  History of breast cancer with known intrathoracic recurrence.  CHEST - 2 VIEW  Comparison: Chest radiographs 11/14/2011.  Chest CT 01/19/2012.  Findings: There is chronic elevation of the left hemidiaphragm with associated left basilar atelectasis.  The patient has known extensive pleural metastatic disease in the upper left hemithorax, the superior component of which has enlarged compared with the prior chest radiographs.  This is better demonstrated on the recent CT.  In further review of that CT, there are appears to be increased paraspinal tumor on the left at T3 and this could affect the nerve root laterally in the T3-T4 foramen.  The known chronic compression deformities at those levels are grossly stable. The lungs are otherwise clear.  There are stable postsurgical changes related to prior mastectomy and axillary node dissection.  IMPRESSION: Progressive metastatic disease within the upper left hemithorax as demonstrated on recent chest CT.  Paraspinal component of this could contribute to the patient's left chest pain on the basis of nerve root encroachment at the T3-T4 level.  Consider MRI of the thoracic spine to better evaluate for possible epidural/neural foraminal tumor.   Original Report Authenticated By: Gerrianne Scale, M.D.    Mr Thoracic Spine W Wo Contrast  03/05/2012  *RADIOLOGY REPORT*  Clinical Data: 66 year old female with breast cancer and progressive pleural-based metastatic disease in the chest. Thoracic spine involvement suspected T3.  MRI THORACIC SPINE WITHOUT AND WITH CONTRAST   Technique:  Multiplanar and multiecho pulse sequences of the thoracic spine were obtained without and with intravenous contrast.  Contrast: 20mL MULTIHANCE GADOBENATE DIMEGLUMINE 529 MG/ML IV SOLN  Comparison: Chest CT 01/19/2012.  Findings: Grossly negative limited sagittal imaging of the cervical spine.  Extensive tumor replacement of the T3 vertebra. Mild pathologic compression.  With bilateral pedicle and left greater than right posterior element tumor infiltration.  Bulky epidural tumor involvement left greater than right severely affecting the left T3 neural foramen and moderately affecting the right T3 foramen. Involvement of the left T3 superior articulating facet results in mild to moderate left T2 foraminal stenosis also.  Similar superior facet involvement on the right only mildly affects the right T2 foramen.  Encroachment on the spinal canal with mild cord compression (series 8 image 10, series 6 image 7).  No definite cord edema.  Near complete tumor infiltration of the T4 vertebral body. Mild pathologic compression.  Trace pedicle involvement on the left.  No other posterior element involvement at this time.  No epidural extension.  Extremely bulky mediastinal tumor is partially visible (series 7 image 13) with left lung apical involvement. Intermittent lung masses.  Bulky pleural disease in the left costophrenic angle. Nodular pleural disease extending along the left posteromedial crura of the diaphragm (series 7 image 13, series 8 image 37).  No other thoracic spine involvement by tumor. Preserved thoracic vertebral height and alignment elsewhere.  Mild or age congruent thoracic degenerative changes.  No other thoracic spinal stenosis. Visualized lower thoracic spinal cord is normal with conus medularis at L1.  No abnormal intradural enhancement.  IMPRESSION: 1.  Severe T3 and T4 metastatic disease with mild pathologic compression fractures and epidural extension causing some cord compression at T3.   No definite cord edema. 2.  Associated extensive bilateral neural foraminal involvement at the T2 and T3 nerve levels greater on the left. 3.  Associated very bulky mediastinal and pleural metastatic disease.  Study discussed by telephone with Dr. Truett Perna on 03/05/2012 at 1115 hours.  He asked that I also discuss the case with Dr. Roselind Messier, who I paged, but at the time of this report have not heard back from.   Original Report Authenticated By: Harley Hallmark, M.D.       IMPRESSION:  Ms. Mcdonell is a very nice 66 year old woman with metastatic breast cancer and recurrent metastatic involvement of the thoracic spine especially at T3-T4. At T3, she has some expansion of the vertebral body posteriorly into the spinal canal with contact to the spinal cord but no overt spinal cord compression. She has no clinical symptoms to suggest spinal cord compression at this time. The patient may benefit from reirradiation. However, the previous course radiation limits the potential treatment techniques and the only available method to deliver a substantial dose to the sites of tumor involvement while sparing spinal cord would be through stereotactic radiosurgery.  PLAN: today, talked patient and her friend about the findings and workup thus far. Talked about the role of stereotactic radiosurgery the management of spinal metastases. We discussed the potential pros and cons of this approach. The patient ask several questions about the procedure and would like to proceed. At this time, we will schedule a radiation planning session on Wednesday, September 11 to be followed by radiation treatment on Friday, September 13, pending formal neurosurgical consultation.  I spent 60 minutes minutes face to face with the patient and more than 50% of that time was spent in counseling and/or coordination of care.   ------------------------------------------------  Artist Pais. Kathrynn Running, M.D.

## 2012-03-08 ENCOUNTER — Ambulatory Visit: Payer: PRIVATE HEALTH INSURANCE

## 2012-03-08 ENCOUNTER — Ambulatory Visit: Payer: PRIVATE HEALTH INSURANCE | Admitting: Radiation Oncology

## 2012-03-08 NOTE — Addendum Note (Signed)
Encounter addended by: Glennie Hawk, RN on: 03/08/2012  8:05 AM<BR>     Documentation filed: Charges VN

## 2012-03-10 ENCOUNTER — Ambulatory Visit: Payer: PRIVATE HEALTH INSURANCE | Admitting: Radiation Oncology

## 2012-03-10 ENCOUNTER — Ambulatory Visit: Payer: PRIVATE HEALTH INSURANCE

## 2012-03-11 ENCOUNTER — Ambulatory Visit: Payer: PRIVATE HEALTH INSURANCE | Admitting: Nurse Practitioner

## 2012-03-12 ENCOUNTER — Encounter: Payer: Self-pay | Admitting: Radiation Oncology

## 2012-03-15 ENCOUNTER — Encounter: Payer: Self-pay | Admitting: *Deleted

## 2012-03-15 ENCOUNTER — Other Ambulatory Visit: Payer: Self-pay | Admitting: Radiation Therapy

## 2012-03-15 ENCOUNTER — Telehealth: Payer: Self-pay | Admitting: *Deleted

## 2012-03-15 DIAGNOSIS — C7951 Secondary malignant neoplasm of bone: Secondary | ICD-10-CM

## 2012-03-15 NOTE — Telephone Encounter (Signed)
Called patient to inform of test and lab, spoke with patient and she is aware of this lab and test

## 2012-03-15 NOTE — Progress Notes (Signed)
Please do an Axial reconstruction through the pedicle for T1 -T2  And T5-T6. Dr. Newell Coral requested this to see the  angle of the anatomy to prepare for surgery.   Also- Dr. Newell Coral will be meeting with Ms. Riederer for consult on 9/18. Please make a copy of the scan and place on a disc for the patient to take with her to his office.  Thanks, Jalene Mullet RTRT

## 2012-03-16 ENCOUNTER — Ambulatory Visit (HOSPITAL_COMMUNITY)
Admission: RE | Admit: 2012-03-16 | Discharge: 2012-03-16 | Disposition: A | Discharge status: Home / Self Care | Payer: PRIVATE HEALTH INSURANCE | Source: Ambulatory Visit | Attending: Radiation Oncology | Admitting: Radiation Oncology

## 2012-03-16 ENCOUNTER — Ambulatory Visit
Admission: RE | Admit: 2012-03-16 | Discharge: 2012-03-16 | Disposition: A | Discharge status: Home / Self Care | Payer: PRIVATE HEALTH INSURANCE | Source: Ambulatory Visit | Attending: Radiation Oncology | Admitting: Radiation Oncology

## 2012-03-16 DIAGNOSIS — C801 Malignant (primary) neoplasm, unspecified: Secondary | ICD-10-CM | POA: Insufficient documentation

## 2012-03-16 DIAGNOSIS — M8448XA Pathological fracture, other site, initial encounter for fracture: Secondary | ICD-10-CM | POA: Insufficient documentation

## 2012-03-16 DIAGNOSIS — C7951 Secondary malignant neoplasm of bone: Secondary | ICD-10-CM | POA: Insufficient documentation

## 2012-03-16 DIAGNOSIS — C7952 Secondary malignant neoplasm of bone marrow: Secondary | ICD-10-CM

## 2012-03-16 DIAGNOSIS — I700 Atherosclerosis of aorta: Secondary | ICD-10-CM | POA: Insufficient documentation

## 2012-03-16 DIAGNOSIS — D4989 Neoplasm of unspecified behavior of other specified sites: Secondary | ICD-10-CM | POA: Insufficient documentation

## 2012-03-16 DIAGNOSIS — C50919 Malignant neoplasm of unspecified site of unspecified female breast: Secondary | ICD-10-CM | POA: Insufficient documentation

## 2012-03-16 LAB — BUN AND CREATININE (CC13): BUN: 14 mg/dL (ref 7.0–26.0)

## 2012-03-16 MED ORDER — IOHEXOL 300 MG/ML  SOLN
100.0000 mL | Freq: Once | INTRAMUSCULAR | Status: AC | PRN
  Administered 2012-03-16: 100 mL via INTRAVENOUS

## 2012-03-16 NOTE — Progress Notes (Signed)
BUN and Creat need to be run as STAT LABS to be ready for the CT [name] [name] is scheduled for at 3:30 on 03/16/12.

## 2012-03-16 NOTE — Progress Notes (Signed)
BUN and Creat need to be run as STAT LABS to be ready for the CT [name] [name] is scheduled for at 3:30 on 03/16/12. 

## 2012-03-17 ENCOUNTER — Telehealth: Payer: Self-pay | Admitting: *Deleted

## 2012-03-17 NOTE — Telephone Encounter (Signed)
Left voice mail at mobile # and son's # requesting a call back to update on how she is doing.

## 2012-03-17 NOTE — Telephone Encounter (Signed)
Message copied by Wandalee Ferdinand on Wed Mar 17, 2012  4:45 PM ------      Message from: Ladene Artist      Created: Sun Mar 14, 2012  4:10 PM       Be sure she is seeing rad. Onc. Or neuro surgery This week.  Needs aptt. With [place] next 2 weeks

## 2012-03-18 ENCOUNTER — Telehealth: Payer: Self-pay | Admitting: *Deleted

## 2012-03-18 ENCOUNTER — Other Ambulatory Visit: Payer: Self-pay | Admitting: *Deleted

## 2012-03-18 MED ORDER — OXYCODONE-ACETAMINOPHEN 7.5-325 MG PO TABS: 1.0000 | ORAL_TABLET | ORAL | Status: DC | PRN

## 2012-03-18 NOTE — Telephone Encounter (Signed)
Call from pt, she met with Dr. Newell Coral, he recommended surgery then radiation. Would like to know if Dr. Truett Perna has heard from Dr. Newell Coral, does Dr. Truett Perna agree with this plan?

## 2012-03-18 NOTE — Telephone Encounter (Signed)
Returned call to pt. Per Dr. Truett Perna: I would go with recommendation of Dr. Newell Coral. Will see her 2 weeks post surgery. Pt voiced understanding. She will call schedulers once surgery is scheduled. Orders entered.

## 2012-03-19 ENCOUNTER — Other Ambulatory Visit: Payer: Self-pay | Admitting: Neurosurgery

## 2012-03-19 ENCOUNTER — Telehealth: Payer: Self-pay | Admitting: Oncology

## 2012-03-19 NOTE — Telephone Encounter (Signed)
Per 9/19 pof appt w/BS 2wks after surgery and pt to call office to schedule.

## 2012-03-23 ENCOUNTER — Encounter (HOSPITAL_COMMUNITY): Payer: Self-pay | Admitting: Pharmacy Technician

## 2012-03-24 ENCOUNTER — Other Ambulatory Visit: Payer: Self-pay | Admitting: Radiation Therapy

## 2012-03-25 ENCOUNTER — Encounter (HOSPITAL_COMMUNITY): Payer: Self-pay

## 2012-03-25 ENCOUNTER — Encounter (HOSPITAL_COMMUNITY)
Admission: RE | Admit: 2012-03-25 | Discharge: 2012-03-25 | Disposition: A | Discharge status: Home / Self Care | Payer: PRIVATE HEALTH INSURANCE | Source: Ambulatory Visit | Attending: Neurosurgery | Admitting: Neurosurgery

## 2012-03-25 HISTORY — DX: Shortness of breath: R06.02

## 2012-03-25 LAB — BASIC METABOLIC PANEL
BUN: 12 mg/dL (ref 6–23)
CO2: 30 mEq/L (ref 19–32)
Calcium: 9.6 mg/dL (ref 8.4–10.5)
Chloride: 105 mEq/L (ref 96–112)
Creatinine, Ser: 0.84 mg/dL (ref 0.50–1.10)
GFR calc Af Amer: 81 mL/min — ABNORMAL LOW (ref >90)
GFR calc non Af Amer: 70 mL/min — ABNORMAL LOW (ref >90)
Glucose, Bld: 89 mg/dL (ref 70–99)
Potassium: 4 mEq/L (ref 3.5–5.1)
Sodium: 141 mEq/L (ref 135–145)

## 2012-03-25 LAB — CBC
HCT: 36.7 % (ref 36.0–46.0)
Hemoglobin: 12.1 g/dL (ref 12.0–15.0)
MCH: 30.3 pg (ref 26.0–34.0)
MCHC: 33 g/dL (ref 30.0–36.0)
MCV: 92 fL (ref 78.0–100.0)
Platelets: 278 10*3/uL (ref 150–400)
RBC: 3.99 MIL/uL (ref 3.87–5.11)
RDW: 13.8 % (ref 11.5–15.5)
WBC: 5.3 10*3/uL (ref 4.0–10.5)

## 2012-03-25 LAB — SURGICAL PCR SCREEN
MRSA, PCR: NEGATIVE
Staphylococcus aureus: NEGATIVE

## 2012-03-25 MED ORDER — CEFAZOLIN SODIUM-DEXTROSE 2-3 GM-% IV SOLR: 2.0000 g | INTRAVENOUS | Status: DC

## 2012-03-25 NOTE — Pre-Procedure Instructions (Signed)
[address]  03/25/2012   Your procedure is scheduled on:  04/01/2012  Thursday  Report to [facility] at 0530 AM.per Dr Newell Coral  Call this number if you have problems the morning of surgery: [phone]   Remember:   Do not eat foodor drink liquids:After Midnight.  .  Take these medicines the morning of surgery with A SIP OF WATER: Albuterol Inhaler (bring it with you day of surgery)  Oxycodone  Percocet     Do not wear jewelry, make-up or nail polish.  Do not wear lotions, powders, or perfumes. You may wear deodorant.  Do not shave 48 hours prior to surgery. Men may shave face and neck.  Do not bring valuables to the hospital.  Contacts, dentures or bridgework may not be worn into surgery.  Leave suitcase in the car. After surgery it may be brought to your room.  For patients admitted to the hospital, checkout time is 11:00 AM the day of discharge.   Patients discharged the day of surgery will not be allowed to drive home.  Name and phone number of your driver: aide  Rosey Bath  Special Instructions: Shower using CHG 2 nights before surgery and the night before surgery.  If you shower the day of surgery use CHG.  Use special wash - you have one bottle of CHG for all showers.  You should use approximately 1/3 of the bottle for each shower.   Please read over the following fact sheets that you were given: Pain Booklet, Coughing and Deep Breathing, Lab Information, MRSA Information and Surgical Site Infection Prevention   Discontinue aspirin plavix coumadin  effient and herbal medicines.

## 2012-03-30 ENCOUNTER — Other Ambulatory Visit: Payer: Self-pay | Admitting: Radiation Therapy

## 2012-03-30 DIAGNOSIS — C7951 Secondary malignant neoplasm of bone: Secondary | ICD-10-CM

## 2012-04-01 ENCOUNTER — Encounter (HOSPITAL_COMMUNITY): Payer: Self-pay | Admitting: Anesthesiology

## 2012-04-01 ENCOUNTER — Inpatient Hospital Stay (HOSPITAL_COMMUNITY)
Admission: RE | Admit: 2012-04-01 | Discharge: 2012-04-02 | DRG: 029 | Disposition: A | Discharge status: Home / Self Care | Payer: PRIVATE HEALTH INSURANCE | Source: Ambulatory Visit | Attending: Neurosurgery | Admitting: Neurosurgery

## 2012-04-01 ENCOUNTER — Encounter (HOSPITAL_COMMUNITY): Payer: Self-pay | Admitting: *Deleted

## 2012-04-01 ENCOUNTER — Encounter (HOSPITAL_COMMUNITY)
Admission: RE | Disposition: A | Discharge status: Home / Self Care | Payer: Self-pay | Source: Ambulatory Visit | Attending: Neurosurgery

## 2012-04-01 ENCOUNTER — Inpatient Hospital Stay (HOSPITAL_COMMUNITY): Payer: PRIVATE HEALTH INSURANCE

## 2012-04-01 ENCOUNTER — Inpatient Hospital Stay (HOSPITAL_COMMUNITY): Payer: PRIVATE HEALTH INSURANCE | Admitting: Anesthesiology

## 2012-04-01 DIAGNOSIS — I10 Essential (primary) hypertension: Secondary | ICD-10-CM | POA: Diagnosis present

## 2012-04-01 DIAGNOSIS — Z79899 Other long term (current) drug therapy: Secondary | ICD-10-CM

## 2012-04-01 DIAGNOSIS — E785 Hyperlipidemia, unspecified: Secondary | ICD-10-CM | POA: Diagnosis present

## 2012-04-01 DIAGNOSIS — Z96659 Presence of unspecified artificial knee joint: Secondary | ICD-10-CM

## 2012-04-01 DIAGNOSIS — Z853 Personal history of malignant neoplasm of breast: Secondary | ICD-10-CM

## 2012-04-01 DIAGNOSIS — Z9010 Acquired absence of unspecified breast and nipple: Secondary | ICD-10-CM

## 2012-04-01 DIAGNOSIS — C7951 Secondary malignant neoplasm of bone: Secondary | ICD-10-CM | POA: Diagnosis present

## 2012-04-01 DIAGNOSIS — C7949 Secondary malignant neoplasm of other parts of nervous system: Principal | ICD-10-CM | POA: Diagnosis present

## 2012-04-01 DIAGNOSIS — Z01812 Encounter for preprocedural laboratory examination: Secondary | ICD-10-CM

## 2012-04-01 DIAGNOSIS — J45909 Unspecified asthma, uncomplicated: Secondary | ICD-10-CM

## 2012-04-01 DIAGNOSIS — Z923 Personal history of irradiation: Secondary | ICD-10-CM

## 2012-04-01 DIAGNOSIS — C779 Secondary and unspecified malignant neoplasm of lymph node, unspecified: Secondary | ICD-10-CM | POA: Diagnosis present

## 2012-04-01 DIAGNOSIS — S98139A Complete traumatic amputation of one unspecified lesser toe, initial encounter: Secondary | ICD-10-CM

## 2012-04-01 DIAGNOSIS — F17200 Nicotine dependence, unspecified, uncomplicated: Secondary | ICD-10-CM | POA: Diagnosis present

## 2012-04-01 DIAGNOSIS — C50919 Malignant neoplasm of unspecified site of unspecified female breast: Secondary | ICD-10-CM | POA: Diagnosis present

## 2012-04-01 DIAGNOSIS — G992 Myelopathy in diseases classified elsewhere: Secondary | ICD-10-CM | POA: Diagnosis present

## 2012-04-01 HISTORY — PX: THORACIC LAMINECTOMY: SHX96

## 2012-04-01 SURGERY — LAMINECTOMY THORACIC FUSION POSTERIOR
Anesthesia: General | Site: Back | Wound class: Clean

## 2012-04-01 MED ORDER — SODIUM CHLORIDE 0.9 % IJ SOLN
3.0000 mL | Freq: Two times a day (BID) | INTRAMUSCULAR | Status: DC
  Administered 2012-04-01: 3 mL via INTRAVENOUS

## 2012-04-01 MED ORDER — LACTATED RINGERS IV SOLN
INTRAVENOUS | Status: DC | PRN
  Administered 2012-04-01 (×3): via INTRAVENOUS

## 2012-04-01 MED ORDER — KETOROLAC TROMETHAMINE 30 MG/ML IJ SOLN
15.0000 mg | Freq: Four times a day (QID) | INTRAMUSCULAR | Status: DC
  Administered 2012-04-01 – 2012-04-02 (×3): 15 mg via INTRAVENOUS
  Filled 2012-04-01 (×7): qty 1

## 2012-04-01 MED ORDER — HYDROXYZINE HCL 50 MG/ML IM SOLN
50.0000 mg | INTRAMUSCULAR | Status: DC | PRN
  Filled 2012-04-01: qty 1

## 2012-04-01 MED ORDER — CEFAZOLIN SODIUM-DEXTROSE 2-3 GM-% IV SOLR
INTRAVENOUS | Status: AC
  Administered 2012-04-01 (×2): 2 g via INTRAVENOUS
  Filled 2012-04-01: qty 50

## 2012-04-01 MED ORDER — PHENOL 1.4 % MT LIQD: 1.0000 | OROMUCOSAL | Status: DC | PRN

## 2012-04-01 MED ORDER — DEXAMETHASONE SODIUM PHOSPHATE 4 MG/ML IJ SOLN
4.0000 mg | Freq: Four times a day (QID) | INTRAMUSCULAR | Status: DC
  Filled 2012-04-01 (×4): qty 1

## 2012-04-01 MED ORDER — ZOLPIDEM TARTRATE 5 MG PO TABS: 5.0000 mg | ORAL_TABLET | Freq: Every evening | ORAL | Status: DC | PRN

## 2012-04-01 MED ORDER — ACETAMINOPHEN 325 MG PO TABS: 650.0000 mg | ORAL_TABLET | ORAL | Status: DC | PRN

## 2012-04-01 MED ORDER — FAMOTIDINE IN NACL 20-0.9 MG/50ML-% IV SOLN
20.0000 mg | Freq: Once | INTRAVENOUS | Status: AC
  Administered 2012-04-01: 20 mg via INTRAVENOUS
  Filled 2012-04-01: qty 50

## 2012-04-01 MED ORDER — KCL IN DEXTROSE-NACL 20-5-0.45 MEQ/L-%-% IV SOLN
INTRAVENOUS | Status: DC
  Filled 2012-04-01 (×4): qty 1000

## 2012-04-01 MED ORDER — OXYCODONE HCL 5 MG PO TABS: 5.0000 mg | ORAL_TABLET | Freq: Once | ORAL | Status: DC | PRN

## 2012-04-01 MED ORDER — SODIUM CHLORIDE 0.9 % IJ SOLN: 3.0000 mL | INTRAMUSCULAR | Status: DC | PRN

## 2012-04-01 MED ORDER — SODIUM CHLORIDE 0.9 % IV SOLN
INTRAVENOUS | Status: AC
  Filled 2012-04-01: qty 500

## 2012-04-01 MED ORDER — FAMOTIDINE IN NACL 20-0.9 MG/50ML-% IV SOLN
20.0000 mg | INTRAVENOUS | Status: DC
  Filled 2012-04-01: qty 50

## 2012-04-01 MED ORDER — SODIUM CHLORIDE 0.9 % IR SOLN
Status: DC | PRN
  Administered 2012-04-01: 09:00:00

## 2012-04-01 MED ORDER — ROCURONIUM BROMIDE 100 MG/10ML IV SOLN
INTRAVENOUS | Status: DC | PRN
  Administered 2012-04-01: 50 mg via INTRAVENOUS

## 2012-04-01 MED ORDER — VECURONIUM BROMIDE 10 MG IV SOLR
INTRAVENOUS | Status: DC | PRN
  Administered 2012-04-01: 3 mg via INTRAVENOUS
  Administered 2012-04-01: 2 mg via INTRAVENOUS
  Administered 2012-04-01: 1 mg via INTRAVENOUS
  Administered 2012-04-01 (×2): 2 mg via INTRAVENOUS

## 2012-04-01 MED ORDER — ALUM & MAG HYDROXIDE-SIMETH 200-200-20 MG/5ML PO SUSP: 30.0000 mL | Freq: Four times a day (QID) | ORAL | Status: DC | PRN

## 2012-04-01 MED ORDER — BUPIVACAINE HCL (PF) 0.5 % IJ SOLN
INTRAMUSCULAR | Status: DC | PRN
  Administered 2012-04-01: 30 mL

## 2012-04-01 MED ORDER — THROMBIN 5000 UNITS EX SOLR
CUTANEOUS | Status: DC | PRN
  Administered 2012-04-01: 5000 [IU] via TOPICAL

## 2012-04-01 MED ORDER — ALBUTEROL SULFATE HFA 108 (90 BASE) MCG/ACT IN AERS
2.0000 | INHALATION_SPRAY | Freq: Four times a day (QID) | RESPIRATORY_TRACT | Status: DC | PRN
  Filled 2012-04-01: qty 6.7

## 2012-04-01 MED ORDER — CYCLOBENZAPRINE HCL 10 MG PO TABS: 10.0000 mg | ORAL_TABLET | Freq: Three times a day (TID) | ORAL | Status: DC | PRN

## 2012-04-01 MED ORDER — FENTANYL CITRATE 0.05 MG/ML IJ SOLN
INTRAMUSCULAR | Status: DC | PRN
  Administered 2012-04-01 (×5): 50 ug via INTRAVENOUS
  Administered 2012-04-01: 100 ug via INTRAVENOUS
  Administered 2012-04-01 (×3): 50 ug via INTRAVENOUS

## 2012-04-01 MED ORDER — MAGNESIUM HYDROXIDE 400 MG/5ML PO SUSP: 30.0000 mL | Freq: Every day | ORAL | Status: DC | PRN

## 2012-04-01 MED ORDER — BACITRACIN 50000 UNITS IM SOLR
INTRAMUSCULAR | Status: AC
  Filled 2012-04-01: qty 1

## 2012-04-01 MED ORDER — ACETAMINOPHEN 650 MG RE SUPP: 650.0000 mg | RECTAL | Status: DC | PRN

## 2012-04-01 MED ORDER — ACETAMINOPHEN 10 MG/ML IV SOLN
INTRAVENOUS | Status: AC
  Filled 2012-04-01: qty 100

## 2012-04-01 MED ORDER — HYDROMORPHONE HCL PF 1 MG/ML IJ SOLN
0.2500 mg | INTRAMUSCULAR | Status: DC | PRN
  Administered 2012-04-01 (×2): 0.5 mg via INTRAVENOUS

## 2012-04-01 MED ORDER — HYDROMORPHONE HCL PF 1 MG/ML IJ SOLN
INTRAMUSCULAR | Status: AC
  Filled 2012-04-01: qty 1

## 2012-04-01 MED ORDER — 0.9 % SODIUM CHLORIDE (POUR BTL) OPTIME
TOPICAL | Status: DC | PRN
  Administered 2012-04-01: 1000 mL

## 2012-04-01 MED ORDER — CEFAZOLIN SODIUM-DEXTROSE 2-3 GM-% IV SOLR
INTRAVENOUS | Status: AC
  Filled 2012-04-01: qty 50

## 2012-04-01 MED ORDER — THROMBIN 20000 UNITS EX KIT
PACK | CUTANEOUS | Status: DC | PRN
  Administered 2012-04-01: 20000 [IU] via TOPICAL

## 2012-04-01 MED ORDER — ACETAMINOPHEN 10 MG/ML IV SOLN
1000.0000 mg | Freq: Four times a day (QID) | INTRAVENOUS | Status: AC
  Administered 2012-04-01 – 2012-04-02 (×4): 1000 mg via INTRAVENOUS
  Filled 2012-04-01 (×5): qty 100

## 2012-04-01 MED ORDER — BISACODYL 10 MG RE SUPP: 10.0000 mg | Freq: Every day | RECTAL | Status: DC | PRN

## 2012-04-01 MED ORDER — OXYCODONE HCL 5 MG PO TABS
5.0000 mg | ORAL_TABLET | ORAL | Status: DC | PRN
  Administered 2012-04-01 – 2012-04-02 (×2): 10 mg via ORAL
  Filled 2012-04-01 (×2): qty 2

## 2012-04-01 MED ORDER — LIDOCAINE HCL (CARDIAC) 20 MG/ML IV SOLN
INTRAVENOUS | Status: DC | PRN
  Administered 2012-04-01: 100 mg via INTRAVENOUS

## 2012-04-01 MED ORDER — DROPERIDOL 2.5 MG/ML IJ SOLN: 0.6250 mg | INTRAMUSCULAR | Status: DC | PRN

## 2012-04-01 MED ORDER — HYDRALAZINE HCL 20 MG/ML IJ SOLN
INTRAMUSCULAR | Status: AC
  Filled 2012-04-01: qty 1

## 2012-04-01 MED ORDER — DEXAMETHASONE 4 MG PO TABS
4.0000 mg | ORAL_TABLET | Freq: Four times a day (QID) | ORAL | Status: DC
  Administered 2012-04-01 – 2012-04-02 (×3): 4 mg via ORAL
  Filled 2012-04-01 (×8): qty 1

## 2012-04-01 MED ORDER — KETOROLAC TROMETHAMINE 30 MG/ML IJ SOLN
INTRAMUSCULAR | Status: AC
  Filled 2012-04-01: qty 1

## 2012-04-01 MED ORDER — HEMOSTATIC AGENTS (NO CHARGE) OPTIME
TOPICAL | Status: DC | PRN
  Administered 2012-04-01: 1 via TOPICAL

## 2012-04-01 MED ORDER — PANTOPRAZOLE SODIUM 40 MG PO TBEC: 40.0000 mg | DELAYED_RELEASE_TABLET | Freq: Every day | ORAL | Status: DC

## 2012-04-01 MED ORDER — ALBUMIN HUMAN 25 % IV SOLN
INTRAVENOUS | Status: DC | PRN
  Administered 2012-04-01: 13:00:00 via INTRAVENOUS

## 2012-04-01 MED ORDER — DEXAMETHASONE SODIUM PHOSPHATE 10 MG/ML IJ SOLN
INTRAMUSCULAR | Status: DC | PRN
  Administered 2012-04-01: 10 mg via INTRAVENOUS
  Administered 2012-04-01: 6 mg via INTRAVENOUS

## 2012-04-01 MED ORDER — GLYCOPYRROLATE 0.2 MG/ML IJ SOLN
INTRAMUSCULAR | Status: DC | PRN
  Administered 2012-04-01: .8 mg via INTRAVENOUS

## 2012-04-01 MED ORDER — HYDROXYZINE HCL 25 MG PO TABS: 50.0000 mg | ORAL_TABLET | ORAL | Status: DC | PRN

## 2012-04-01 MED ORDER — LIDOCAINE-EPINEPHRINE 1 %-1:100000 IJ SOLN
INTRAMUSCULAR | Status: DC | PRN
  Administered 2012-04-01: 20 mL

## 2012-04-01 MED ORDER — HYDRALAZINE HCL 20 MG/ML IJ SOLN
5.0000 mg | Freq: Once | INTRAMUSCULAR | Status: AC
  Administered 2012-04-01: 5 mg via INTRAVENOUS

## 2012-04-01 MED ORDER — ONDANSETRON HCL 4 MG/2ML IJ SOLN
INTRAMUSCULAR | Status: DC | PRN
  Administered 2012-04-01: 4 mg via INTRAVENOUS

## 2012-04-01 MED ORDER — NEOSTIGMINE METHYLSULFATE 1 MG/ML IJ SOLN
INTRAMUSCULAR | Status: DC | PRN
  Administered 2012-04-01: 4 mg via INTRAVENOUS

## 2012-04-01 MED ORDER — MENTHOL 3 MG MT LOZG: 1.0000 | LOZENGE | OROMUCOSAL | Status: DC | PRN

## 2012-04-01 MED ORDER — SODIUM CHLORIDE 0.9 % IV SOLN: 250.0000 mL | INTRAVENOUS | Status: DC

## 2012-04-01 MED ORDER — MORPHINE SULFATE 4 MG/ML IJ SOLN
4.0000 mg | INTRAMUSCULAR | Status: DC | PRN
  Administered 2012-04-01: 4 mg via INTRAMUSCULAR
  Filled 2012-04-01: qty 1

## 2012-04-01 MED ORDER — MIDAZOLAM HCL 5 MG/5ML IJ SOLN
INTRAMUSCULAR | Status: DC | PRN
  Administered 2012-04-01: 2 mg via INTRAVENOUS

## 2012-04-01 MED ORDER — OXYCODONE HCL 5 MG/5ML PO SOLN: 5.0000 mg | Freq: Once | ORAL | Status: DC | PRN

## 2012-04-01 MED ORDER — SUMATRIPTAN SUCCINATE 50 MG PO TABS
50.0000 mg | ORAL_TABLET | Freq: Every day | ORAL | Status: DC | PRN
  Filled 2012-04-01: qty 1

## 2012-04-01 MED ORDER — HYDRALAZINE HCL 20 MG/ML IJ SOLN: 5.0000 mg | Freq: Once | INTRAMUSCULAR | Status: DC

## 2012-04-01 MED ORDER — KETOROLAC TROMETHAMINE 30 MG/ML IJ SOLN
15.0000 mg | Freq: Once | INTRAMUSCULAR | Status: AC
  Administered 2012-04-01: 15 mg via INTRAVENOUS

## 2012-04-01 MED ORDER — PROPOFOL 10 MG/ML IV BOLUS
INTRAVENOUS | Status: DC | PRN
  Administered 2012-04-01: 200 mg via INTRAVENOUS
  Administered 2012-04-01 (×2): 30 mg via INTRAVENOUS

## 2012-04-01 MED ORDER — PHENYLEPHRINE HCL 10 MG/ML IJ SOLN
INTRAMUSCULAR | Status: DC | PRN
  Administered 2012-04-01 (×10): 40 ug via INTRAVENOUS

## 2012-04-01 SURGICAL SUPPLY — 95 items
1.3MM WIRE PASS DRILL ×1 IMPLANT
28 mm cross connectors ×1 IMPLANT
3.5 x 28 mm screw ×3 IMPLANT
3.5 x 30 mm screws ×4 IMPLANT
3.5 x26mm screw ×1 IMPLANT
ADH SKN CLS APL DERMABOND .7 (GAUZE/BANDAGES/DRESSINGS) ×1
APL SKNCLS STERI-STRIP NONHPOA (GAUZE/BANDAGES/DRESSINGS)
BAG DECANTER FOR FLEXI CONT (MISCELLANEOUS) ×2 IMPLANT
BENZOIN TINCTURE PRP APPL 2/3 (GAUZE/BANDAGES/DRESSINGS) IMPLANT
BLADE SURG ROTATE 9660 (MISCELLANEOUS) IMPLANT
BRUSH SCRUB EZ PLAIN DRY (MISCELLANEOUS) ×2 IMPLANT
BUR ACORN 6.0 ACORN (BURR) IMPLANT
BUR ACRON 5.0MM COATED (BURR) ×2 IMPLANT
BUR MATCHSTICK NEURO 3.0 LAGG (BURR) ×2 IMPLANT
CANISTER SUCTION 2500CC (MISCELLANEOUS) ×2 IMPLANT
CLOTH BEACON ORANGE TIMEOUT ST (SAFETY) ×2 IMPLANT
CONT SPEC 4OZ CLIKSEAL STRL BL (MISCELLANEOUS) ×2 IMPLANT
COVER BACK TABLE 24X17X13 BIG (DRAPES) IMPLANT
COVER TABLE BACK 60X90 (DRAPES) ×2 IMPLANT
DERMABOND ADVANCED (GAUZE/BANDAGES/DRESSINGS) ×1
DERMABOND ADVANCED .7 DNX12 (GAUZE/BANDAGES/DRESSINGS) ×2 IMPLANT
DRAPE C-ARM 42X72 X-RAY (DRAPES) ×4 IMPLANT
DRAPE C-ARMOR (DRAPES) ×1 IMPLANT
DRAPE LAPAROTOMY 100X72X124 (DRAPES) ×2 IMPLANT
DRAPE MICROSCOPE LEICA (MISCELLANEOUS) ×1 IMPLANT
DRAPE POUCH INSTRU U-SHP 10X18 (DRAPES) ×2 IMPLANT
DRAPE PROXIMA HALF (DRAPES) IMPLANT
DRAPE SURG 17X23 STRL (DRAPES) ×2 IMPLANT
DRESSING TELFA 8X3 (GAUZE/BANDAGES/DRESSINGS) IMPLANT
DRSG EMULSION OIL 3X3 NADH (GAUZE/BANDAGES/DRESSINGS) IMPLANT
DURAPREP 26ML APPLICATOR (WOUND CARE) ×2 IMPLANT
ELECT REM PT RETURN 9FT ADLT (ELECTROSURGICAL) ×2
ELECTRODE REM PT RTRN 9FT ADLT (ELECTROSURGICAL) ×1 IMPLANT
GAUZE SPONGE 4X4 16PLY XRAY LF (GAUZE/BANDAGES/DRESSINGS) IMPLANT
GLOVE BIO SURGEON STRL SZ8.5 (GLOVE) ×1 IMPLANT
GLOVE BIOGEL PI IND STRL 8 (GLOVE) ×2 IMPLANT
GLOVE BIOGEL PI INDICATOR 8 (GLOVE) ×8
GLOVE ECLIPSE 7.5 STRL STRAW (GLOVE) ×5 IMPLANT
GLOVE EXAM NITRILE LRG STRL (GLOVE) IMPLANT
GLOVE EXAM NITRILE MD LF STRL (GLOVE) ×2 IMPLANT
GLOVE EXAM NITRILE XL STR (GLOVE) IMPLANT
GLOVE EXAM NITRILE XS STR PU (GLOVE) IMPLANT
GLOVE INDICATOR 7.5 STRL GRN (GLOVE) ×1 IMPLANT
GLOVE INDICATOR 8.5 STRL (GLOVE) ×4 IMPLANT
GLOVE SKINSENSE NS SZ8.0 LF (GLOVE) ×1
GLOVE SKINSENSE STRL SZ8.0 LF (GLOVE) IMPLANT
GOWN BRE IMP SLV AUR LG STRL (GOWN DISPOSABLE) IMPLANT
GOWN BRE IMP SLV AUR XL STRL (GOWN DISPOSABLE) ×6 IMPLANT
GOWN STRL REIN 2XL LVL4 (GOWN DISPOSABLE) ×4 IMPLANT
HEMOSTAT POWDER KIT SURGIFOAM (HEMOSTASIS) ×1 IMPLANT
KIT BASIN OR (CUSTOM PROCEDURE TRAY) ×2 IMPLANT
KIT POSITION SURG JACKSON T1 (MISCELLANEOUS) ×2 IMPLANT
KIT ROOM TURNOVER OR (KITS) ×2 IMPLANT
MILL MEDIUM DISP (BLADE) ×2 IMPLANT
NDL 18GX1X1/2 (RX/OR ONLY) (NEEDLE) ×1 IMPLANT
NDL HYPO 25X1 1.5 SAFETY (NEEDLE) ×1 IMPLANT
NDL SPNL 18GX3.5 QUINCKE PK (NEEDLE) IMPLANT
NDL SPNL 22GX3.5 QUINCKE BK (NEEDLE) ×1 IMPLANT
NEEDLE 18GX1X1/2 (RX/OR ONLY) (NEEDLE) ×2 IMPLANT
NEEDLE BONE MARROW 8GAX6 (NEEDLE) IMPLANT
NEEDLE HYPO 25X1 1.5 SAFETY (NEEDLE) ×2 IMPLANT
NEEDLE SPNL 18GX3.5 QUINCKE PK (NEEDLE) IMPLANT
NEEDLE SPNL 22GX3.5 QUINCKE BK (NEEDLE) ×2 IMPLANT
NS IRRIG 1000ML POUR BTL (IV SOLUTION) ×2 IMPLANT
PACK LAMINECTOMY NEURO (CUSTOM PROCEDURE TRAY) ×2 IMPLANT
PAD ARMBOARD 7.5X6 YLW CONV (MISCELLANEOUS) ×6 IMPLANT
PATTIES SURGICAL .5 X.5 (GAUZE/BANDAGES/DRESSINGS) ×1 IMPLANT
PATTIES SURGICAL .5 X1 (DISPOSABLE) IMPLANT
PATTIES SURGICAL 1X1 (DISPOSABLE) ×1 IMPLANT
PEDIGUARD TRI TIP 3.2MM (INSTRUMENTS) ×1 IMPLANT
RUBBERBAND STERILE (MISCELLANEOUS) ×2 IMPLANT
SCREW SET THREADED (Screw) ×8 IMPLANT
SPONGE GAUZE 4X4 12PLY (GAUZE/BANDAGES/DRESSINGS) ×2 IMPLANT
SPONGE LAP 4X18 X RAY DECT (DISPOSABLE) IMPLANT
SPONGE NEURO XRAY DETECT 1X3 (DISPOSABLE) IMPLANT
SPONGE SURGIFOAM ABS GEL 100 (HEMOSTASIS) IMPLANT
STAPLER SKIN PROX WIDE 3.9 (STAPLE) IMPLANT
STRIP BIOACTIVE VITOSS 25X52X4 (Orthopedic Implant) ×1 IMPLANT
STRIP CLOSURE SKIN 1/2X4 (GAUZE/BANDAGES/DRESSINGS) ×2 IMPLANT
STRIP VITOSS 25X100X4MM (Neuro Prosthesis/Implant) ×1 IMPLANT
SUT PROLENE 6 0 BV (SUTURE) IMPLANT
SUT VIC AB 1 CT1 18XBRD ANBCTR (SUTURE) ×2 IMPLANT
SUT VIC AB 1 CT1 8-18 (SUTURE) ×6
SUT VIC AB 2-0 CP2 18 (SUTURE) ×5 IMPLANT
SYR 20CC LL (SYRINGE) ×2 IMPLANT
SYR 3ML LL SCALE MARK (SYRINGE) ×8 IMPLANT
SYR 5ML LL (SYRINGE) IMPLANT
SYR CONTROL 10ML LL (SYRINGE) ×2 IMPLANT
SYR INSULIN 1ML 31GX6 SAFETY (SYRINGE) IMPLANT
TAPE CLOTH SURG 4X10 WHT LF (GAUZE/BANDAGES/DRESSINGS) ×1 IMPLANT
TOWEL OR 17X24 6PK STRL BLUE (TOWEL DISPOSABLE) ×2 IMPLANT
TOWEL OR 17X26 10 PK STRL BLUE (TOWEL DISPOSABLE) ×2 IMPLANT
TRAP SPECIMEN MUCOUS 40CC (MISCELLANEOUS) ×2 IMPLANT
TRAY FOLEY CATH 14FRSI W/METER (CATHETERS) ×2 IMPLANT
WATER STERILE IRR 1000ML POUR (IV SOLUTION) ×2 IMPLANT

## 2012-04-01 NOTE — Preoperative (Signed)
Beta Blockers   Reason not to administer Beta Blockers:Not Applicable 

## 2012-04-01 NOTE — Transfer of Care (Signed)
Immediate Anesthesia Transfer of Care Note  Patient: [name] [name]  Procedure(s) Performed: Procedure(s) (LRB) with comments: LAMINECTOMY THORACIC FUSION POSTERIOR (N/A) - Thoracic laminectomy for tumor resection thoraci two-four  with thoracic arthrodesis thoracic one - six and posterior instrumentation  at thoracic one-two, five - six  Patient Location: PACU  Anesthesia Type: General  Level of Consciousness: awake, sedated and patient cooperative  Airway & Oxygen Therapy: Patient Spontanous Breathing and Patient connected to face mask oxygen  Post-op Assessment: Report given to PACU RN and Post -op Vital signs reviewed and stable, moves all extremities.  Post vital signs: Reviewed  Complications: No apparent anesthesia complications

## 2012-04-01 NOTE — Anesthesia Postprocedure Evaluation (Signed)
Anesthesia Post Note  Patient: [name] [name]  Procedure(s) Performed: Procedure(s) (LRB): LAMINECTOMY THORACIC FUSION POSTERIOR (N/A)  Anesthesia type: general  Patient location: PACU  Post pain: Pain level controlled  Post assessment: Patient's Cardiovascular Status Stable  Last Vitals:  Filed Vitals:   04/01/12 1523  BP: 174/68  Pulse: 75  Temp:   Resp: 14    Post vital signs: Reviewed and stable  Level of consciousness: sedated  Complications: No apparent anesthesia complications

## 2012-04-01 NOTE — Anesthesia Preprocedure Evaluation (Signed)
Anesthesia Evaluation  Patient identified by MRN, date of birth, ID band Patient awake    Reviewed: Allergy & Precautions, H&P , NPO status , Patient's Chart, lab work & pertinent test results  History of Anesthesia Complications Negative for: history of anesthetic complications  Airway Mallampati: I  Neck ROM: Full  Mouth opening: Limited Mouth Opening  Dental  (+) Edentulous Upper and Edentulous Lower   Pulmonary shortness of breath and with exertion, asthma , former smoker,  breath sounds clear to auscultation  Pulmonary exam normal       Cardiovascular +CHF negative cardio ROS  Rhythm:Regular Rate:Normal     Neuro/Psych  Headaches,    GI/Hepatic negative GI ROS, Neg liver ROS,   Endo/Other  negative endocrine ROS  Renal/GU negative Renal ROS     Musculoskeletal   Abdominal   Peds  Hematology   Anesthesia Other Findings   Reproductive/Obstetrics                           Anesthesia Physical Anesthesia Plan  ASA: III  Anesthesia Plan: General   Post-op Pain Management:    Induction: Intravenous  Airway Management Planned: Oral ETT  Additional Equipment:   Intra-op Plan:   Post-operative Plan:   Informed Consent: I have reviewed the patients History and Physical, chart, labs and discussed the procedure including the risks, benefits and alternatives for the proposed anesthesia with the patient or authorized representative who has indicated his/her understanding and acceptance.   Dental advisory given  Plan Discussed with: CRNA, Anesthesiologist and Surgeon  Anesthesia Plan Comments:         Anesthesia Quick Evaluation

## 2012-04-01 NOTE — Op Note (Signed)
04/01/2012  2:26 PM  PATIENT:  [name] [name]  66 y.o. 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  ANESTHESIA:   general  EBL:  Total I/O In: 2450 [I.V.:2200; IV Piggyback:250] Out: 860 [Urine:460; Blood:400]  BLOOD ADMINISTERED:none  COUNT:  Correct per nursing staff  SPECIMEN:  Source of Specimen:  thoracic epidural space  DICTATION: Patient was brought to the operating room, placed under general endotracheal anesthesia. The radiolucent 3 pin Mayfield head holder was applied and the patient was turned to a prone position. The cervical thoracic region was prepped with Betadine soap and solution and draped in a sterile fashion. The midline was infiltrated with local site with epinephrine. Midline incision made carried down to subcutaneous tissue, bipolar cautery and electrocautery used to maintain hemostasis. The thoracic fascia was incised bilaterally, and the paraspinous musculature was dissected from the spinous process and lamina in a subperiosteal fashion. The C-arm fluoroscope was draped and brought in the field and we localized the T1-T6 thoracic vertebra. Using C-arm fluoroscopic guidance and the Pediguard probe, we probed the pedicles bilaterally at T1, T2, T5, and T6. Each was examined with the ball probe,  good bone service were found, and we placed 3.5 mm vuepoint screws. At T1 the left screw was 28 mm the right screws 26 mm, at T2 both screws were 28 mm, at T5 and T6 all the screws were 30 mm in length. We then proceeded with the laminectomy and tumor resection. Laminectomies performed using double-action rongeurs, a high-speed drill, and Kerrison punches with thin footplates. As the laminectomy was performed we encountered tumor earlier on the left side, but in fact was found epidurally bilaterally. The operating microscope was draped and brought into the field to provide additional magnification, illumination, and visualization, and the remainder the decompression was performed using microdissection and microsurgical technique. The epidural tumor was removed, however did emanate from the tumor within the vertebral body, and therefore only a subtotal resection was performed. However good decompression of the thecal sac and exiting nerve roots was achieved. Hemostasis was established the use of bipolar cautery, Gelfoam with thrombin, and Surgifoam. Once the tumor resection was completed hemostasis established we proceeded to complete the arthrodesis. We cut rods to a length of about 130 mm, they were contoured using a rod bender, and then placed within the screw heads. Locking caps were applied, and once all 8 locking caps were in place final tightening was performed against a counter torque. We did place a 28 mm fixed cross-link across the laminectomy defect. The cross-link was secured firmly to the rods at each side. We decorticated the lamina of T1, T2, T4, T5, and T6. We then packed the lateral gutters over the transverse processes, as well as over the lamina of of T1, T2, T4, T5, T6. The wound was irrigated numerous times through the procedure with saline solution as well as bacitracin solution. Once hemostasis was confirmed we  proceeded with closure. Paraspinal muscles were approximate interrupted undyed 1  Vicryl sutures, deep fascia was closed with interrupted undyed 1 Vicryl sutures. Scarpa's fascia was closed interrupted inverted 2-0 and 1 undyed Vicryl sutures. The subcutaneous and subcuticular closed interrupted inverted 2-0 Vicryl sutures. Skin was approximated with Dermabond. The wound was  dressed with sterile gauze and Hypafix.  PLAN OF CARE: Admit to inpatient   PATIENT DISPOSITION:  PACU - hemodynamically stable.   Delay start of Pharmacological VTE agent (>24hrs) due to surgical blood loss or risk of bleeding:  yes

## 2012-04-01 NOTE — Progress Notes (Signed)
Filed Vitals:   04/01/12 1533 04/01/12 1543 04/01/12 1553 04/01/12 1612  BP: 155/69 161/70 150/73 185/81  Pulse: 72 73 77 85  Temp:   97.1 F (36.2 C) 97.4 F (36.3 C)  TempSrc:    Oral  Resp: 15 14 18 18   SpO2: 94% 93% 94% 95%    Patient sitting up inside of bed, comfortable. Dressing clean and dry. Foley to straight drainage. Moving all extremities well.  Plan: Encouraged patient to ambulate. We'll have staff remove Foley.  Hewitt Shorts, MD 04/01/2012, 6:25 PM

## 2012-04-01 NOTE — H&P (Signed)
Subjective: Patient is a 66 y.o. female who is admitted for treatment of metastatic involvement of the upper thoracic spine. Patient has a long history of breast cancer. More recently she's been having pain in her upper back. She's been found by MRI scan to have metastatic involvement of the T3 and T4 vertebra (worse at T3) as well as involvement in the chest cavity itself. There is loss of tibial body height at T3 and epidural tumor extending bilaterally into the neural foramina, with compression of the thecal sac, and possibly spinal cord.  Patient was seen in radiation oncology consultation for consideration as to radiosurgery. However Dr. Margaretmary Bayley felt that it would be best to have her undergo separation surgery, so as to remove sufficient amount of epidural tumor, to provide adequate space between the tumor and spinal cord is so as to avoid giving the spinal cord and excessive dose of radiation with the stereotactic radiosurgery.  The patient is therefore now admitted for thoracic laminectomy, tumor resection, and thoracic arthrodesis with bone graft and posterior instrumentation.  Patient Active Problem List   Diagnosis Date Noted  . Breast cancer   . Hx of radiation therapy   . Bone metastases   . Bilateral leg pain 12/30/2011  . Tremor 08/20/2011  . Frequent falls 10/23/2010  . HYPERLIPIDEMIA 10/10/2009  . HYPERTENSION 07/21/2007  . LOW BACK PAIN 08/11/2006  . MIGRAINE HEADACHE 07/06/2006  . ASTHMA 04/30/2006  . BREAST CANCER, HX OF 04/30/2006  . MASTECTOMY, HX OF 04/30/2006   Past Medical History  Diagnosis Date  . Cancer   . Hyperlipidemia   . Hypertension   . Asthma     best PEF=400  . 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 Chronic low back pain     MRI 2010 showed right sided disease with paracentral  disc protusion at L5-S1 which contacts and displaces the right S1 nerve root  within the lateral recess  . Migraine headache     MRI 11/18/10: Mild progression of prominent white matter type changes which may be related to a small vessel disease and / or migraine headaches.Other causes for white matter type changes such as that secondaryto; vasculitis, inflammatory process or demyelinating process feltto be secondary considerations.  . Leg swelling   . Neuralgia and neuritis   . Tenosynovitis     left, MRI 05/2003  . Herniated disc     h/o ruptured disk, laminectomy L4-L5, Dr. Montez Morita  . Breast cancer 1996, 2001    f/b Dr. Myrle Sheng. s/p masectomy +radation. w/ metastatic disease, now on Femara.  . Tobacco abuse   . Hx of radiation therapy 06/21/02 -07/13/02    T1-T6  . Bone metastases     breast primary  . Shortness of breath     exertion    Past Surgical History  Procedure Date  . Abdominal hysterectomy   . Cesarean section   . Mastectomy   . Laminectomy     L4-L5  . Joint replacement     Left Knee  . Back surgery   . Breast surgery   . Amputation 11/26/2011    Procedure: AMPUTATION DIGIT;  Surgeon: Kennieth Rad, MD;  Location: [facility] OR;  Service: Orthopedics;  Laterality: Bilateral;  PHALANGECTOMY  - Right fifth toe and left third toe  . Rotator cuff repair     pt does  not remember year of surgery    Prescriptions prior to admission  Medication Sig Dispense Refill  . albuterol (PROVENTIL HFA;VENTOLIN HFA) 108 (90 BASE) MCG/ACT inhaler Inhale 2 puffs into the lungs every 6 (six) hours as needed. Shortness of breath  1 Inhaler  3  . Oxycodone HCl 10 MG TABS Take 10 mg by mouth every 4 (four) hours as needed. For pain      . oxyCODONE-acetaminophen (PERCOCET) 7.5-325 MG per tablet Take 1 tablet by mouth every 4 (four) hours as needed. For pain      . SUMAtriptan (IMITREX) 50 MG tablet Take 50 mg by mouth daily as needed. For migraines       Allergies  Allergen Reactions  . Banana     Abdominal pain      History  Substance Use Topics  . Smoking  status: Current Every Day Smoker -- 0.5 packs/day for 50 years    Types: Cigarettes  . Smokeless tobacco: Never Used   Comment: smoking a little less  . Alcohol Use: No    Family History  Problem Relation Age of Onset  . Anesthesia problems Neg Hx   . Hypotension Neg Hx   . Malignant hyperthermia Neg Hx   . Pseudochol deficiency Neg Hx      Review of Systems A comprehensive review of systems was negative.  Objective: Vital signs in last 24 hours: Temp:  [98.4 F (36.9 C)] 98.4 F (36.9 C) (10/03 0610) Pulse Rate:  [72] 72  (10/03 0610) Resp:  [18] 18  (10/03 0610) BP: (147)/(85) 147/85 mmHg (10/03 0610) SpO2:  [98 %] 98 % (10/03 0610)  EXAM: Patient is a well-developed well-nourished black female in no acute distress. Lungs are clear to auscultation , the patient has symmetrical respiratory excursion. Heart has a regular rate and rhythm normal S1 and S2 no murmur.   Abdomen is soft nontender nondistended bowel sounds are present. Extremity examination shows no clubbing cyanosis or edema. Musculoskeletal examination shows tenderness to palpation diffusely in the upper thoracic region. Neurologic examination shows 5 over 5 strength in the upper and lower extremities, although she does tend to give way slightly due to pain associated with full effort, but it does seem that she is 5 over 5 strength in the deltoid, biceps, triceps, intrinsics, grip, iliopsoas, quadriceps, dorsiflexor, EHL, and plantar flexors bilaterally. Sensation is intact to pinprick through the distal upper and lower extremities. Reflexes are diminished but symmetrical. Toes are downgoing bilaterally. She has a normal gait and stance  Data Review:CBC    Component Value Date/Time   WBC 5.3 03/25/2012 0935   WBC 5.0 01/16/2012 0903   RBC 3.99 03/25/2012 0935   RBC 3.96 01/16/2012 0903   HGB 12.1 03/25/2012 0935   HGB 12.3 01/16/2012 0903   HCT 36.7 03/25/2012 0935   HCT 36.7 01/16/2012 0903   PLT 278 03/25/2012 0935    PLT 211 01/16/2012 0903   MCV 92.0 03/25/2012 0935   MCV 92.7 01/16/2012 0903   MCH 30.3 03/25/2012 0935   MCH 30.9 01/16/2012 0903   MCHC 33.0 03/25/2012 0935   MCHC 33.4 01/16/2012 0903   RDW 13.8 03/25/2012 0935   RDW 14.4 01/16/2012 0903   LYMPHSABS 1.1 01/16/2012 0903   LYMPHSABS 1.6 04/11/2010 0613   MONOABS 0.4 01/16/2012 0903   MONOABS 0.6 04/11/2010 0613   EOSABS 0.0 01/16/2012 0903   EOSABS 0.0 04/11/2010 0613   BASOSABS 0.0 01/16/2012 0903   BASOSABS 0.0 04/11/2010 7829  BMET    Component Value Date/Time   NA 141 03/25/2012 0935   K 4.0 03/25/2012 0935   CL 105 03/25/2012 0935   CO2 30 03/25/2012 0935   GLUCOSE 89 03/25/2012 0935   BUN 12 03/25/2012 0935   BUN 14.0 03/16/2012 1344   CREATININE 0.84 03/25/2012 0935   CREATININE 1.0 03/16/2012 1344   CREATININE 0.89 10/23/2010 1514   CALCIUM 9.6 03/25/2012 0935   GFRNONAA 70* 03/25/2012 0935   GFRAA 81* 03/25/2012 0935     Assessment/Plan: Patient with metastatic breast cancer involving the thoracic spine and chest. There is particular involved in the T3 and T4 vertebra with extension of tumor into the epidural space. Patient's been planned to undergo stereotactic radiosurgery to the thoracic spine however in consultation with Dr. Margaretmary Bayley from the radiation oncology department he feels that the bulging of the epidural tumor to provide [facility] the spinal cord to allow for subsidence urgent radiosurgery in conjunction with overall systemic therapy we'll give the best chance of avoiding progressive spinal cord dysfunction and paralysis, as well as to treat her overall systemic disease.  I discussed with the patient the nature of her condition and our recommendations for treatment. She's agreed to proceed with surgery, she understands the nature of surgery, to go to surgery, and its risks including risks of infection, bleeding, possible neurologic dysfunction including paralysis of the lower extremities as well as  of her bowel and bladder, the risk of failure of the arthrodesis, and further collapse of the thoracic spine, and anesthetic risks of myocardial infarction, stroke, pneumonia, and death. She also understands the risks of going without surgery including progressive spinal cord compression and paralysis as described above. Understanding all this he wishes to proceed with surgery and is in for such.   Hewitt Shorts, MD 04/01/2012 7:09 AM

## 2012-04-01 NOTE — Anesthesia Procedure Notes (Signed)
Procedure Name: Intubation Date/Time: 04/01/2012 8:01 AM Performed by: Angelica Pou Pre-anesthesia Checklist: Patient identified, Emergency Drugs available, Suction available, Patient being monitored and Timeout performed Patient Re-evaluated:Patient Re-evaluated prior to inductionOxygen Delivery Method: Circle system utilized Preoxygenation: Pre-oxygenation with 100% oxygen Intubation Type: IV induction Ventilation: Mask ventilation without difficulty and Oral airway inserted - appropriate to patient size Laryngoscope Size: Mac and 3 Grade View: Grade II Tube type: Oral Number of attempts: 1 Airway Equipment and Method: Stylet and Oral airway Placement Confirmation: ETT inserted through vocal cords under direct vision,  breath sounds checked- equal and bilateral and positive ETCO2 Secured at: 22 cm Tube secured with: Tape Dental Injury: Teeth and Oropharynx as per pre-operative assessment

## 2012-04-02 LAB — POCT I-STAT 4, (NA,K, GLUC, HGB,HCT)
Glucose, Bld: 111 mg/dL — ABNORMAL HIGH (ref 70–99)
HCT: 34 % — ABNORMAL LOW (ref 36.0–46.0)
Hemoglobin: 11.6 g/dL — ABNORMAL LOW (ref 12.0–15.0)
Potassium: 3.8 mEq/L (ref 3.5–5.1)
Sodium: 139 mEq/L (ref 135–145)

## 2012-04-02 MED ORDER — OXYCODONE-ACETAMINOPHEN 5-325 MG PO TABS: 1.0000 | ORAL_TABLET | ORAL | Status: DC | PRN

## 2012-04-02 NOTE — Discharge Summary (Signed)
Physician Discharge Summary  Patient ID: [name] [name] MRN: [id number] DOB/AGE: [birth year] 68 y.o.  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 Postoperatively she is much more comfortable than prior surgery. She is up and living actively. She is voiding well. Wound is healing well. She's been given instructions regarding wound care and activities. We will see her into a half weeks for her scheduled stereotactic radiosurgery, and and plan on seeing her for further followup subsequently.  Discharge Exam: Blood pressure 132/76, pulse 88, temperature 98.2 F (36.8 C), temperature source Oral, resp. rate 18, height 5\' 8"  (1.727 m), weight 97.886 kg (215 lb 12.8 oz), SpO2 98.00%.  Disposition: Home  Discharge Orders    Future Appointments: Provider: Department: Dept Phone: Center:   04/19/2012  8:30 AM [facility] Delsa Grana [phone] [facility]   04/19/2012 10:45 AM Oneita Hurt, MD [facility] [phone] None   04/19/2012 11:00 AM Oneita Hurt, MD [facility] [phone] None     Joint Appt [facility] [phone] None   04/21/2012 12:00 PM Oneita Hurt, MD [facility] [phone] None     Joint Appt [facility] [phone] None   05/14/2012 8:45 AM Krista Blue [facility] [phone] None   05/14/2012 9:15 AM Rana Snare, NP [facility] [phone] None       Medication List     As of 04/02/2012  7:14 AM    TAKE these medications         albuterol 108 (90 BASE) MCG/ACT inhaler   Commonly known as: PROVENTIL HFA;VENTOLIN HFA   Inhale 2 puffs into the lungs every 6 (six) hours as needed. Shortness of breath      Oxycodone HCl 10 MG Tabs   Take 10 mg by mouth every 4 (four) hours as needed. For pain      oxyCODONE-acetaminophen 7.5-325 MG per tablet   Commonly known as: PERCOCET   Take 1 tablet by mouth every 4 (four) hours as needed. For pain      oxyCODONE-acetaminophen 5-325 MG per tablet   Commonly known as: PERCOCET/ROXICET   Take 1-2 tablets by mouth every 4 (four) hours as needed for pain.      SUMAtriptan 50 MG tablet   Commonly known as: IMITREX   Take 50 mg by mouth daily as needed. For migraines         Signed: Hewitt Shorts, MD 04/02/2012, 7:14 AM

## 2012-04-02 NOTE — Progress Notes (Signed)
Patient was able to walk around the unit one time with walker and standby assistance. Will continue to monitor pt.

## 2012-04-02 NOTE — Progress Notes (Signed)
UR COMPLETED  

## 2012-04-07 ENCOUNTER — Other Ambulatory Visit: Payer: Self-pay | Admitting: *Deleted

## 2012-04-08 ENCOUNTER — Telehealth: Payer: Self-pay | Admitting: *Deleted

## 2012-04-08 NOTE — Telephone Encounter (Signed)
Patient confirmed over the phone the new date and time 04-14-2012 starting at 11:15am

## 2012-04-14 ENCOUNTER — Other Ambulatory Visit: Payer: PRIVATE HEALTH INSURANCE | Admitting: Lab

## 2012-04-14 ENCOUNTER — Telehealth: Payer: Self-pay | Admitting: Oncology

## 2012-04-14 ENCOUNTER — Ambulatory Visit (HOSPITAL_BASED_OUTPATIENT_CLINIC_OR_DEPARTMENT_OTHER): Payer: PRIVATE HEALTH INSURANCE | Admitting: Nurse Practitioner

## 2012-04-14 VITALS — BP 145/77 | HR 86 | Temp 97.1°F | Resp 20 | Ht 68.0 in | Wt 209.0 lb

## 2012-04-14 DIAGNOSIS — R609 Edema, unspecified: Secondary | ICD-10-CM

## 2012-04-14 DIAGNOSIS — C50919 Malignant neoplasm of unspecified site of unspecified female breast: Secondary | ICD-10-CM

## 2012-04-14 DIAGNOSIS — C787 Secondary malignant neoplasm of liver and intrahepatic bile duct: Secondary | ICD-10-CM

## 2012-04-14 NOTE — Progress Notes (Signed)
OFFICE PROGRESS NOTE  Interval history:  [name] [name] returns as scheduled. She underwent inferior T2, T3 and superior T4 thoracic laminectomy, resection of epidural metastatic tumor on 04/01/2012. 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 The tumor was ER positive 100%; PR positive 53%; HER-2/neu by CISH showed amplification.  The pain she was experiencing prior to surgery has resolved. The pain she has at present she relates to the surgery. She is taking Percocet as needed. She is also taking Valium periodically for "muscle spasms". She denies any weakness or numbness in the legs. No bowel or bladder dysfunction. Appetite varies. She denies nausea/vomiting. She reports being intermittently unsteady. She has had no falls.   Objective: Blood pressure 145/77, pulse 86, temperature 97.1 F (36.2 C), temperature source Oral, resp. rate 20, height 5\' 8"  (1.727 m), weight 209 lb (94.802 kg).  Oropharynx is without thrush or ulceration. Lungs are clear. Laminectomy incision is intact. No erythema or drainage. Regular cardiac rhythm. Status post left mastectomy. 1-2 cm crusted nodular cutaneous lesion at the medial aspect of the mastectomy scar. Abdomen is soft and nontender. No hepatomegaly. Extremities are without edema. Motor strength 5 over 5. Finger to nose intact. With initial evaluation gait was unsteady. Repeat gait testing was improved.  Lab Results: Lab Results  Component Value Date   WBC 5.3 03/25/2012   HGB 11.6* 04/01/2012   HCT 34.0* 04/01/2012   MCV 92.0 03/25/2012   PLT 278 03/25/2012    Chemistry:    Chemistry      Component Value Date/Time   NA 139 04/01/2012 1222   K 3.8 04/01/2012 1222   CL 105 03/25/2012 0935   CO2 30 03/25/2012 0935   BUN 12 03/25/2012 0935   BUN 14.0 03/16/2012 1344   CREATININE 0.84 03/25/2012 0935   CREATININE 1.0 03/16/2012 1344   CREATININE 0.89 10/23/2010 1514      Component Value Date/Time   CALCIUM 9.6 03/25/2012 0935     ALKPHOS 86 10/23/2010 1514   AST 11 10/23/2010 1514   ALT 8 10/23/2010 1514   BILITOT 0.3 10/23/2010 1514       Studies/Results: Dg Thoracic Spine 4v  04/01/2012  *RADIOLOGY REPORT*  Clinical Data: Pathologic thoracic fracture  THORACIC SPINE - 4+ VIEW  Comparison: 03/16/2012  Findings: Three fluoroscopic intraoperative thoracic spot images document bilateral pedicle screw placement at T1, T2, T5, and T6.  IMPRESSION:  1.  Intraoperative localization   Original Report Authenticated By: Osa Craver, M.D.    Ct Thoracic Spine W Wo Contrast  03/16/2012  *RADIOLOGY REPORT*  Clinical Data: Metastatic breast cancer and T3 and T4.  Evaluate bone detail.  CT THORACIC SPINE WITHOUT AND WITH CONTRAST  Technique:  Multidetector CT imaging of the thoracic spine was performed without and with intravenous contrast administration. Multiplanar CT image reconstructions were also generated  Contrast: OMNIPAQUE IOHEXOL 300 MG/ML  SOLN  Comparison: MRI of the thoracic spine 03/05/2012 and CT chest with contrast 01/19/2012.  Findings: Pathologic fractures at T3 and T4 are again noted.  Tumor extends through the pedicle of T3 on the left and has eroded the superior articulating process.  There is enhancing tumor in the left foramina at T2-3 and T3-4.  The tumor has progressed since the July study.  The tumor extends into the right foramen at T3-4 although the pedicle appears to be intact on the right.  There is some destruction of the posterior vertebral body.  The cortex is intact at  T2 and the posterior elements bilaterally at T4.  The tumor is contiguous from the left paraspinal space at T3-2 with the superior mediastinal tumor.  The superior mediastinal tumor surrounds the subclavian and left common carotid artery.  The superior extent of the tumor demonstrates continued progression along the chest wall to the apex of the left lung.  No additional osseous destruction is evident.  Paraseptal emphysematous  changes are present.  Atherosclerotic calcifications are present in the aorta.  Mild degenerative endplate changes are present in the lower thoracic spine.  Cervical spondylosis is evident.  IMPRESSION: 1.  Pathologic compression fractures at T3 and T4 there is similar to the prior study. 2.  Osseous destruction of the left pedicle and posterior elements at T3 including the superior articulating facet. 3.  Tumor in the left T2-3 foramen and bilateral T3-4 foramina. 4.  The posterior elements are intact at T4. 5.  Progressive mediastinal tumor which surrounds the origin of the great vessels. 6.  Progressive superior extent of tumor along the medial border of the left hemithorax.   Original Report Authenticated By: Jamesetta Orleans. MATTERN, M.D.     Medications: I have reviewed the patient's current medications.  Assessment/Plan:  1.Metastatic breast cancer - initially diagnosed with breast cancer in 1996 status post left mastectomy and axillary lymph node dissection. She developed a chest wall recurrence in 2001 and completed radiation. In April 2003, CT showed mediastinal, internal mammary, and right axillary adenopathy and two liver lesions. MRI of the thoracic spine June 18, 2002 revealed osseous abnormalities at T3 and T4 suspicious for metastatic disease with extension into the T3 pedicle. There was extra osseous extension compressing and displacing the thoracic cord. She completed radiation from T1 through T6 June 21, 2002 through July 13, 2002. CT scan Nov 11, 2002 showed progression of metastatic disease with precardiac nodes, left pleural and lower lobe disease, subcutaneous nodules at the left anterior chest wall and progressive metastatic disease involving the liver. She began Femara in May/early June 2004 with subsequent resolution of chest wall nodules. CT scans January 14, 2005, of the chest and abdomen revealed findings of decreased pleural-based metastatic disease in the lower left  hemithorax and no new or progressive disease in the chest. Small lesion in the right liver measuring 13.0 x 10.0 mm was reported as stable when compared to her previous study. No other liver lesions were seen. Bone scan January 22, 2006 showed degenerative changes. She discontinued Femara February 2009 and was lost a followup. She presented in March 2010 with an ulcerated chest wall lesion and an elevated CA27.29. Restaging CTs September 18, 2008 showed a 1.3 x 2.2 cm subdermal lesion at the left anterior chest, multiple enlarged metastases in the left hemithorax at the mediastinum and pleura felt to represent necrotic lymph nodes versus pleural metastases. Right axillary lymph node was decreased in size compared to 2006. There were no suspicious bone lesions. A previously noted lesion in the inferior right liver was not clearly seen. She resumed Femara March 2010. On October 23, 2008 the chest wall lesion had improved. The CA27.29 on October 23, 2008 was stable at 60. She discontinued Femara in June 2010 and CTs of the abdomen and pelvis April 06, 2009 showed slight decrease in the size of a pleural-based nodule in the left lower lobe. There was no other evidence for metastatic disease in the abdomen or pelvis. She resumed Femara on April 11, 2009. CA27.29 returned at 87 on April 11, 2009. CT of  the abdomen and pelvis on May 30, 2009 showed an increase in multiple pericardial masses, increased left chest wall subcutaneous nodule, stable pleural-based metastases and stable liver lesions. She discontinued Femara in November 2010 due to nausea and vomiting. Femara was resumed following an office visit June 06, 2009. Dr. Myrle Sheng reviewed a CT from May 30, 2009 with a [name] radiologist comparing the scan to scans from March 2010 and October 2010. In addition, a CT scan from 2006 was reviewed. The pleural-based lesions in the left chest had not changed significantly since March 2010. The pericardial masses  appeared slightly larger. Both the pericardial masses and pleural-based masses were larger compared to the CT from 2006. She discontinued Femara January 2011. The left chest wall nodule recurred during the time she was off of Femara. Femara was resumed following an office visit 10/02/2009. The nodule again improved. Restaging CT of the chest 08/16/2010 was stable compared to a CT from 03/29/2010. Most recent CA 27-29 on 01/16/2012 was stable at 340. Femara was continued. CT the chest 02/23/2012 with progressive pleural-based tumor in the left chest with probable involvement of T3. Femara was discontinued. MRI of the thoracic spine on 03/05/2012 showed severe T3 and T4 metastatic disease with mild pathologic compression fractures and epidural extension causing some cord compression at T3. There was no definite cord edema. There was associated extensive bilateral neural foraminal involvement at the T2 and T3 nerve levels greater on the left. There was associated very bulky mediastinal and pleural metastatic disease. She underwent a laminectomy procedure on 04/01/2012 with resection of epidural metastatic tumor. 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 The tumor was ER positive 100%; PR positive 53%; HER-2/neu by CISH showed amplification. 2. Ulcerated left chest lesion, likely a metastatic lesion. Stable.  3. Severe right low back/buttock pain radiating to the left leg October 2010 with MRI on April 06, 2009 revealing a disk protrusion at L5-S1 displacing the right S1 nerve root. She was evaluated by Dr. Jordan Likes. The pain has resolved.  4. Hospitalization December 1 through May 31, 2009 with nausea, vomiting and abdominal pain - resolved.  5. 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  6. Pain at the right back/ iliac region October 25, 2008.  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  8. Status post  left knee replacement.  9. Chronic edema of the left lower leg.  10. "Migraines". She is taking Imitrex.  11. Falls and balance problems occurring over the past year. She has been referred to neurology.  12. Right breast with area of firmness on exam March 2013 -negative right breast mammogram and ultrasound 09/15/2011  13. Pain at upper back. Resolved following the laminectomy procedure. 14. Unsteady gait on initial evaluation 04/14/2012. The gait was better on the repeat evaluation. We will make a referral to physical therapy.  Disposition-Ms. Dominski appears stable. She has a followup visit with Dr. Kathrynn Running next week to discuss radiation therapy. At today's visit Dr. Truett Perna recommended hormonal therapy with Faslodex to begin following completion of radiation therapy. At the time of that recommendation we did not have the pathology report showing HER-2/neu amplification. Dr. Truett Perna will recommend chemotherapy/Herceptin in place of hormonal therapy. We will contact Ms. Krul to make her aware of this change. Of note, however, Ms. Hermance stated at today's visit that she was not interested in receiving chemotherapy. She is scheduled to return for a followup visit on 04/30/2012.  Further discussion regarding the treatment plan will occur at that visit. We will obtain a baseline CA 27-29 when she returns on 04/30/2012.  Plan discussed with Dr. Truett Perna.    Lonna Cobb ANP/GNP-BC

## 2012-04-14 NOTE — Progress Notes (Signed)
[facility] referral for PT : evaluate and treat for gait disturbance ordered and faxed.

## 2012-04-14 NOTE — Telephone Encounter (Signed)
appts made and printed for pt aom °

## 2012-04-16 ENCOUNTER — Encounter: Payer: Self-pay | Admitting: Radiation Oncology

## 2012-04-19 ENCOUNTER — Ambulatory Visit
Admission: RE | Admit: 2012-04-19 | Discharge: 2012-04-19 | Disposition: A | Discharge status: Home / Self Care | Payer: PRIVATE HEALTH INSURANCE | Source: Ambulatory Visit | Attending: Radiation Oncology | Admitting: Radiation Oncology

## 2012-04-19 ENCOUNTER — Encounter: Payer: Self-pay | Admitting: Radiation Oncology

## 2012-04-19 VITALS — BP 155/78 | HR 79 | Temp 98.4°F | Resp 20 | Wt 213.0 lb

## 2012-04-19 DIAGNOSIS — C7951 Secondary malignant neoplasm of bone: Secondary | ICD-10-CM

## 2012-04-19 DIAGNOSIS — C7931 Secondary malignant neoplasm of brain: Secondary | ICD-10-CM

## 2012-04-19 MED ORDER — GADOBENATE DIMEGLUMINE 529 MG/ML IV SOLN
20.0000 mL | Freq: Once | INTRAVENOUS | Status: AC | PRN
  Administered 2012-04-19: 20 mL via INTRAVENOUS

## 2012-04-19 NOTE — Progress Notes (Signed)
Please see the Nurse Progress Note in the MD Initial Consult Encounter for this patient. 

## 2012-04-19 NOTE — Progress Notes (Signed)
Pt c/o being "very tired", had MRI this morning. She c/o mid back "soreness where I had surgery". Oxycodone 5-325 mg prn, taking 2 tabs tid. She states the med "releases her pain and soreness some". Pt denies loss of appetite. Pt eating snack so she can take her pain med again.

## 2012-04-19 NOTE — Patient Instructions (Signed)
Call if you have any questions.

## 2012-04-19 NOTE — Addendum Note (Signed)
Encounter addended by: Glennie Hawk, RN on: 04/19/2012  4:15 PM<BR>     Documentation filed: Charges VN

## 2012-04-19 NOTE — Progress Notes (Signed)
Radiation Oncology         [phone] ________________________________  Name: [name] [name] MRN: [id number]  Date: 04/19/2012  DOB: 12/07/1945  Follow-Up Visit Note  CC: Lorretta Harp, MD  Lorretta Harp, MD  Diagnosis:   66 yo woman with spinal involvement from metastatic breast cancer.  Narrative:  The patient returns today for routine follow-up.  She had targeting MRI this morning for SRS to the spine.                              ALLERGIES:  is allergic to banana.  Meds: Current Outpatient Prescriptions  Medication Sig Dispense Refill  . albuterol (PROVENTIL HFA;VENTOLIN HFA) 108 (90 BASE) MCG/ACT inhaler Inhale 2 puffs into the lungs every 6 (six) hours as needed. Shortness of breath  1 Inhaler  3  . diazepam (VALIUM) 5 MG tablet Take 5-10 mg by mouth every 6 (six) hours as needed.      [name] oxyCODONE-acetaminophen (PERCOCET/ROXICET) 5-325 MG per tablet Take 1-2 tablets by mouth every 4 (four) hours as needed for pain.  60 tablet  0  . SUMAtriptan (IMITREX) 50 MG tablet Take 50 mg by mouth daily as needed. For migraines       No current facility-administered medications for this encounter.   Facility-Administered Medications Ordered in Other Encounters  Medication Dose Route Frequency Provider Last Rate Last Dose  . gadobenate dimeglumine (MULTIHANCE) injection 20 mL  20 mL Intravenous Once PRN Medication Radiologist, MD   20 mL at 04/19/12 1029    Physical Findings: The patient is in no acute distress. Patient is alert and oriented.  weight is 213 lb (96.616 kg). Her oral temperature is 98.4 F (36.9 C). Her blood pressure is 155/78 and her pulse is 79. Her respiration is 20. Marland KitchenHe back incision is well intended with glue and no evidence of infection. No significant changes.  Radiographic Findings: Dg Thoracic Spine 4v  04/01/2012  *RADIOLOGY REPORT*  Clinical Data: Pathologic thoracic fracture  THORACIC SPINE - 4+ VIEW  Comparison: 03/16/2012  Findings: Three fluoroscopic  intraoperative thoracic spot images document bilateral pedicle screw placement at T1, T2, T5, and T6.  IMPRESSION:  1.  Intraoperative localization   Original Report Authenticated By: Osa Craver, M.D.    Mr Thoracic Spine W Wo Contrast  04/19/2012  *RADIOLOGY REPORT*  Clinical Data: Restaging.  Metastatic breast cancer.  Stereotactic radiosurgery planning.  MRI THORACIC SPINE WITHOUT AND WITH CONTRAST  Technique:  Multiplanar and multiecho pulse sequences of the thoracic spine were obtained without and with intravenous contrast.  Contrast: 20mL MULTIHANCE GADOBENATE DIMEGLUMINE 529 MG/ML IV SOLN  Comparison: 03/05/2012. Intraoperative radiographs 04/01/2012.  Findings: Numbering preserved from prior examination.  Fixation hardware extends from T1-T6.  Artifact from fixation hardware partially obscures the central canal.  Metastatic lesions completely occupies the T3 and T4 vertebral bodies.  There has been no progressive loss of vertebral body height.  The fixation hardware has been placed in the interval since the prior exam, and there is a posterior paraspinal fluid collection extending the length of the operative bed.  Craniocaudal extent is at least 16 cm.  Maximal dimensions on transverse imaging are 47 mm transverse by 27 mm AP.  The thoracic cord is obscured by susceptibility artifact from fixation hardware and previously seen cord deformity cannot be visualized.  Bulky mediastinal adenopathy, left lower lobe pulmonary nodule and pleural disease (image number 30 series 6)  is noted.  Left major fissure nodule again noted.  There are no new spinal lesions identified.  The epidural extension of tumor eccentric to the left seen on prior exam is partially obscured by susceptibility artifact from hardware. Posterior decompression of the central canal appears to have been performed from T2-T5.  On the sagittal postcontrast images, some of the left mediastinal adenopathy appears necrotic with central  nonenhancing tissue.  IMPRESSION: 1.  New T1-T6 posterior rod and screw fixation of the thoracic spine. 2.  Large postoperative fluid collection in the posterior paraspinal soft tissues.  This likely represents postoperative hematoma or seroma.  Spinal leak cannot be excluded. 3.  No new osseous metastatic disease.  Extraosseous extension of tumor at T2, T3 and T4 appears similar to the prior exam allowing for artifact from hardware. 4.  Tumor in the central canal appears to produce some deformity of the thoracic cord at T3 and T4 despite prior posterior decompression.  Grossly this appears unchanged with full evaluation precluded by hardware.   Original Report Authenticated By: Andreas Newport, M.D.    Dg C-arm 61-120 Min  04/15/2012  *RADIOLOGY REPORT*  Clinical Data: Pathologic thoracic fracture  THORACIC SPINE - 4+ VIEW  Comparison: 03/16/2012  Findings: Three fluoroscopic intraoperative thoracic spot images document bilateral pedicle screw placement at T1, T2, T5, and T6.  IMPRESSION:  1.  Intraoperative localization   Original Report Authenticated By: Osa Craver, M.D.     Impression:  The patient has had decompressive separation surgery for cord compression and is ready for SRS.  Plan:  Proceed with SRS planning.  _____________________________________  Artist Pais. Kathrynn Running, M.D.

## 2012-04-19 NOTE — Progress Notes (Signed)
Met with patient to discuss RO billing. Pt has no financial concerns today and was ready to go home.  Dx: 198.5 Bone and bone marrow  Attending Rad: Dr. Landis Gandy Tx: [facility]ot

## 2012-04-19 NOTE — Addendum Note (Signed)
Encounter addended by: Delynn Flavin, RN on: 04/19/2012  7:14 PM<BR>     Documentation filed: Charges VN

## 2012-04-19 NOTE — Progress Notes (Signed)
  Radiation Oncology         [phone] ________________________________  Name: [name] [name] MRN: [id number]  Date: 04/19/2012  DOB: 1946-03-06  STEREOTACTIC SPINAL RADIOSURGERY SIMULATION AND TREATMENT PLANNING NOTE  NARRATIVE:  The patient was brought to the CT Simulation planning suite.  Identity was confirmed.  All relevant records and images related to the planned course of therapy were reviewed.  The patient freely provided informed written consent to proceed with treatment after reviewing the details related to the planned course of therapy. The consent form was witnessed and verified by the simulation staff.  Then, the patient was set-up in a stable reproducible  supine position for radiation therapy.  A BodyFix immobilization pillow was fabricated for reproducible positioning. CT images were obtained.  Surface markings were placed.  The CT images were loaded into the planning software.  The the gross target volume (GTV) and planning target volumes (PTV) were delinieated, and avoidance structures were contoured.  Treatment planning then occurred.  The radiation prescription was entered and confirmed.  A total of two complex treatment devices were fabricated in the form of the BodyFix immobilization pillow and a neck accuform cushion.  I have requested : 3D Simulation  I have requested a DVH of the following structures: Spinal Cord, Heart, Lungs, Esophagus, Chest Wall, Brachial Plexus, Major Blood Vessels, and targets.  PLAN:  The patient will receive 18 Gy in one fraction.  ________________________________  Artist Pais Kathrynn Running, M.D.

## 2012-04-21 ENCOUNTER — Ambulatory Visit
Admission: RE | Admit: 2012-04-21 | Discharge: 2012-04-21 | Disposition: A | Discharge status: Home / Self Care | Payer: PRIVATE HEALTH INSURANCE | Source: Ambulatory Visit | Attending: Radiation Oncology | Admitting: Radiation Oncology

## 2012-04-21 ENCOUNTER — Ambulatory Visit: Payer: PRIVATE HEALTH INSURANCE | Admitting: Radiation Oncology

## 2012-04-21 ENCOUNTER — Encounter: Payer: Self-pay | Admitting: Radiation Oncology

## 2012-04-21 VITALS — BP 148/66 | HR 71 | Temp 98.0°F | Resp 20

## 2012-04-21 DIAGNOSIS — C7931 Secondary malignant neoplasm of brain: Secondary | ICD-10-CM

## 2012-04-21 DIAGNOSIS — Z923 Personal history of irradiation: Secondary | ICD-10-CM

## 2012-04-21 DIAGNOSIS — C50919 Malignant neoplasm of unspecified site of unspecified female breast: Secondary | ICD-10-CM

## 2012-04-21 HISTORY — DX: Personal history of irradiation: Z92.3

## 2012-04-21 MED ORDER — HYDROCODONE-ACETAMINOPHEN 5-500 MG PO TABS: 1.0000 | ORAL_TABLET | Freq: Four times a day (QID) | ORAL | Status: DC | PRN

## 2012-04-21 NOTE — Progress Notes (Signed)
  Radiation Oncology         [phone] ________________________________  Spinal Stereotactic Radiosurgery Procedure Note  Name: [name] [name] MRN: [id number]  Date: 04/21/2012  DOB: [birth year]  SPECIAL TREATMENT PROCEDURE  3D TREATMENT PLANNING AND DOSIMETRY:  The patient's radiation plan was reviewed and approved by neurosurgery and radiation oncology prior to treatment.  It showed 3-dimensional radiation distributions overlaid onto the planning CT/MRI image set.  The [facility] for the target structures as well as the organs at risk were reviewed. The documentation of the 3D plan and dosimetry are filed in the radiation oncology EMR.  NARRATIVE:  [name] [name] was brought to the TrueBeam stereotactic radiation treatment machine and placed supine on the CT couch. The patient was precisely re-positioned in their BodyFix immobilization device, and the patient was set up for stereotactic radiosurgery.  Neurosurgery was present for the set-up and delivery  SIMULATION VERIFICATION:  In the couch zero-angle position, the patient underwent Exactrac imaging using the Brainlab system with orthogonal KV images to position the target accounting for translation and rotational factors.  These were carefully aligned and repeated to confirm treatment position.  Then, cone beam CT was performed to help further verify placement and make any final translational shifts.  SPECIAL TREATMENT PROCEDURE: [name] received stereotactic radiosurgery to the following targets: The targeted metastasis in the T3 and T4 vertebral bodies were treated using 9 Static Gantry IMRT Beams to a prescription dose of 18 Gy.  STEREOTACTIC TREATMENT MANAGEMENT:  Following delivery, the patient was transported to nursing in stable condition and monitored for possible acute effects.  Vital signs were recorded BP 148/66  Pulse 71  Temp 98 F (36.7 C) (Oral)  Resp 20. The patient tolerated treatment without significant acute  effects, and was discharged to home in stable condition.    PLAN: Follow-up in one month.  ________________________________  Artist Pais. Kathrynn Running, M.D.

## 2012-04-21 NOTE — Progress Notes (Signed)
Pt in rm 1 s/p SRS tx T-spine. She is sitting in recliner, occasional; weeping. She states "I just want to go home." Pt c/o pain in her back, denies nausea. Pt declined offer of drink.  Pt continues to rest quietly, VS checked. Pt states her back pain "is better that it was", denies nausea. Paged S Boyles for pain script for pt. Gave pt script for Hydrocodone 5-500 mg per Dr Kathrynn Running. Pt states her back pain has decreased more. Walked w/pt to lobby for her ride, reminded her to call for any problems, questions and to expect FU call from [employer] tomorrow. Pt verbalized understanding.

## 2012-04-22 NOTE — Progress Notes (Signed)
  Radiation Oncology         [phone] ________________________________  Name: KRISTYNE WOODRING MRN: [id number]  Date: 04/21/2012  DOB: Feb 23, 1946  End of Treatment Note  Diagnosis: 66 yo woman with spinal involvement from metastatic breast cancer.  Indication for treatment:  Palliation, prevention of spinal cord injury in conjunction with separation surgery and reconstruction       Radiation treatment dates:   04/21/2012  Site/dose:   [name] received stereotactic radiosurgery to the targeted metastasis in the T3 and T4 vertebral bodies were treated using 9 Static Gantry IMRT Beams to a prescription dose of 18 Gy.  Beams/energy:   6 megavolt photons were delivered in the flattening filter free mode  Narrative: The patient tolerated radiation treatment relatively well.   She did not experience any acute complications related radiation.  Plan: The patient has completed radiation treatment. The patient will return to radiation oncology clinic for routine followup in one month. I advised her to call or return sooner if they have any questions or concerns related to her recovery or treatment. ________________________________  Artist Pais. Kathrynn Running, M.D.

## 2012-04-30 ENCOUNTER — Telehealth: Payer: Self-pay | Admitting: *Deleted

## 2012-04-30 ENCOUNTER — Other Ambulatory Visit (HOSPITAL_BASED_OUTPATIENT_CLINIC_OR_DEPARTMENT_OTHER): Payer: Medicaid Other | Admitting: Lab

## 2012-04-30 ENCOUNTER — Ambulatory Visit (HOSPITAL_BASED_OUTPATIENT_CLINIC_OR_DEPARTMENT_OTHER): Payer: Medicaid Other | Admitting: Oncology

## 2012-04-30 ENCOUNTER — Ambulatory Visit: Payer: PRIVATE HEALTH INSURANCE

## 2012-04-30 ENCOUNTER — Telehealth: Payer: Self-pay | Admitting: Oncology

## 2012-04-30 VITALS — BP 135/78 | HR 83 | Temp 97.6°F | Resp 20 | Ht 68.0 in | Wt 205.6 lb

## 2012-04-30 DIAGNOSIS — C7952 Secondary malignant neoplasm of bone marrow: Secondary | ICD-10-CM

## 2012-04-30 DIAGNOSIS — R609 Edema, unspecified: Secondary | ICD-10-CM

## 2012-04-30 DIAGNOSIS — C50919 Malignant neoplasm of unspecified site of unspecified female breast: Secondary | ICD-10-CM

## 2012-04-30 DIAGNOSIS — C787 Secondary malignant neoplasm of liver and intrahepatic bile duct: Secondary | ICD-10-CM

## 2012-04-30 DIAGNOSIS — C7951 Secondary malignant neoplasm of bone: Secondary | ICD-10-CM

## 2012-04-30 LAB — COMPREHENSIVE METABOLIC PANEL (CC13)
Albumin: 3 g/dL — ABNORMAL LOW (ref 3.5–5.0)
Alkaline Phosphatase: 88 U/L (ref 40–150)
BUN: 12 mg/dL (ref 7.0–26.0)
Glucose: 101 mg/dl — ABNORMAL HIGH (ref 70–99)
Potassium: 3.9 mEq/L (ref 3.5–5.1)

## 2012-04-30 LAB — CBC WITH DIFFERENTIAL/PLATELET
Basophils Absolute: 0 10*3/uL (ref 0.0–0.1)
Eosinophils Absolute: 0.1 10*3/uL (ref 0.0–0.5)
HGB: 10.5 g/dL — ABNORMAL LOW (ref 11.6–15.9)
LYMPH%: 22.6 % (ref 14.0–49.7)
MCV: 90.8 fL (ref 79.5–101.0)
MONO%: 11.9 % (ref 0.0–14.0)
NEUT#: 3.5 10*3/uL (ref 1.5–6.5)
Platelets: 316 10*3/uL (ref 145–400)

## 2012-04-30 LAB — CANCER ANTIGEN 27.29: CA 27.29: 246 U/mL — ABNORMAL HIGH (ref 0–39)

## 2012-04-30 MED ORDER — MORPHINE SULFATE 15 MG PO TABS
15.0000 mg | ORAL_TABLET | Freq: Once | ORAL | Status: AC
  Administered 2012-04-30: 15 mg via ORAL

## 2012-04-30 MED ORDER — MORPHINE SULFATE 15 MG PO TABS: 15.0000 mg | ORAL_TABLET | ORAL | Status: DC | PRN

## 2012-04-30 NOTE — Telephone Encounter (Signed)
appts made and printed for pt aom °

## 2012-04-30 NOTE — Progress Notes (Signed)
[facility]    OFFICE PROGRESS NOTE   INTERVAL HISTORY:   She returns as scheduled. She completed is a resting to the spine on 04/21/2012. She complains of persistent pain at the back. She relates the pain to surgery. Oxycodone does not relieve her pain. The gait ataxia is unchanged since surgery. No other complaint.  Objective:  Vital signs in last 24 hours:  Blood pressure 135/78, pulse 83, temperature 97.6 F (36.4 C), temperature source Oral, resp. rate 20, height 5\' 8"  (1.727 m), weight 205 lb 9.6 oz (93.26 kg).    HEENT: Neck without mass Lymphatics: No cervical, supraclavicular, or x-ray nodes Resp: Lungs clear bilaterally Cardio: Regular rate and rhythm GI: No hepatomegaly Vascular: No leg edema Neuro: Alert, the motor exam appears intact in the upper and lower extremities, the gait is ataxic and unsteady  Skin: Status post left mastectomy, 2 cm area of raised firmness at the central left anterior chest with an overlying eschar     Lab Results:  Lab Results  Component Value Date   WBC 5.6 04/30/2012   HGB 10.5* 04/30/2012   HCT 31.4* 04/30/2012   MCV 90.8 04/30/2012   PLT 316 04/30/2012   ANC 3.5    Medications: I have reviewed the patient's current medications.  Assessment/Plan: 1.Metastatic breast cancer - initially diagnosed with breast cancer in 1996 status post left mastectomy and axillary lymph node dissection. She developed a chest wall recurrence in 2001 and completed radiation. In April 2003, CT showed mediastinal, internal mammary, and right axillary adenopathy and two liver lesions. MRI of the thoracic spine June 18, 2002 revealed osseous abnormalities at T3 and T4 suspicious for metastatic disease with extension into the T3 pedicle. There was extra osseous extension compressing and displacing the thoracic cord. She completed radiation from T1 through T6 June 21, 2002 through July 13, 2002. CT scan Nov 11, 2002 showed  progression of metastatic disease with precardiac nodes, left pleural and lower lobe disease, subcutaneous nodules at the left anterior chest wall and progressive metastatic disease involving the liver. She began Femara in May/early June 2004 with subsequent resolution of chest wall nodules. CT scans January 14, 2005, of the chest and abdomen revealed findings of decreased pleural-based metastatic disease in the lower left hemithorax and no new or progressive disease in the chest. Small lesion in the right liver measuring 13.0 x 10.0 mm was reported as stable when compared to her previous study. No other liver lesions were seen. Bone scan January 22, 2006 showed degenerative changes. She discontinued Femara February 2009 and was lost a followup. She presented in March 2010 with an ulcerated chest wall lesion and an elevated CA27.29. Restaging CTs September 18, 2008 showed a 1.3 x 2.2 cm subdermal lesion at the left anterior chest, multiple enlarged metastases in the left hemithorax at the mediastinum and pleura felt to represent necrotic lymph nodes versus pleural metastases. Right axillary lymph node was decreased in size compared to 2006. There were no suspicious bone lesions. A previously noted lesion in the inferior right liver was not clearly seen. She resumed Femara March 2010. On October 23, 2008 the chest wall lesion had improved. The CA27.29 on October 23, 2008 was stable at 60. She discontinued Femara in June 2010 and CTs of the abdomen and pelvis April 06, 2009 showed slight decrease in the size of a pleural-based nodule in the left lower lobe. There was no other evidence for metastatic disease in the abdomen or pelvis.  She resumed Femara on April 11, 2009. CA27.29 returned at 87 on April 11, 2009. CT of the abdomen and pelvis on May 30, 2009 showed an increase in multiple pericardial masses, increased left chest wall subcutaneous nodule, stable pleural-based metastases and stable liver lesions. She  discontinued Femara in November 2010 due to nausea and vomiting. Femara was resumed following an office visit June 06, 2009. Dr. Myrle Sheng reviewed a CT from May 30, 2009 with a [name] radiologist comparing the scan to scans from March 2010 and October 2010. In addition, a CT scan from 2006 was reviewed. The pleural-based lesions in the left chest had not changed significantly since March 2010. The pericardial masses appeared slightly larger. Both the pericardial masses and pleural-based masses were larger compared to the CT from 2006. She discontinued Femara January 2011. The left chest wall nodule recurred during the time she was off of Femara. Femara was resumed following an office visit 10/02/2009. The nodule again improved. Restaging CT of the chest 08/16/2010 was stable compared to a CT from 03/29/2010. Most recent CA 27-29 on 01/16/2012 was stable at 340. Femara was continued. CT the chest 02/23/2012 with progressive pleural-based tumor in the left chest with probable involvement of T3. Femara was discontinued. MRI of the thoracic spine on 03/05/2012 showed severe T3 and T4 metastatic disease with mild pathologic compression fractures and epidural extension causing some cord compression at T3. There was no definite cord edema. There was associated extensive bilateral neural foraminal involvement at the T2 and T3 nerve levels greater on the left. There was associated very bulky mediastinal and pleural metastatic disease. She underwent a laminectomy procedure on 04/01/2012 with resection of epidural metastatic tumor. 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 The tumor was ER positive 100%; PR positive 53%; HER-2/neu by CISH showed amplification.  2. Ulcerated left chest lesion, likely a metastatic lesion. Stable.  3. Severe right low back/buttock pain radiating to the left leg October 2010 with MRI on April 06, 2009 revealing a disk protrusion at L5-S1 displacing  the right S1 nerve root. She was evaluated by Dr. Jordan Likes. The pain has resolved.  4. Hospitalization December 1 through May 31, 2009 with nausea, vomiting and abdominal pain - resolved.  5. 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  6. Pain at the right back/ iliac region October 25, 2008.  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  8. Status post left knee replacement.  9. Chronic edema of the left lower leg.  10. "Migraines". She is taking Imitrex.  11. Falls and balance problems occurring over the past year. She has been referred to neurology.  12. Right breast with area of firmness on exam March 2013 -negative right breast mammogram and ultrasound 09/15/2011  13. Pain at upper back. Resolved following the laminectomy procedure.  14. Unsteady gait on 04/14/2012, persistent. We will make a home physical therapy referral to assess safety and the need for a walker.   Disposition:  Ms. Dabney has metastatic breast cancer. There is progressive metastatic disease in the chest and spine. The tumor is hormone receptor positive and HER-2 positive. She has not received Herceptin in the past.  I discussed systemic treatment options with her today. I recommend treatment with Taxol/Herceptin. I reviewed the potential toxicities associated with Herceptin including a chance for cardiac toxicity. We reviewed the alopecia, nausea, mucositis, bone pain, hematologic toxicity, neuropathy, and allergic reaction associated with Taxol. She  agrees to proceed. She will be scheduled for chemotherapy teaching class and a first cycle of weekly Taxol/Herceptin on 05/04/2012.  She has persistent back pain. This may be related to tumor involving the pleura/spine or the recent surgery/radiation. She was prescribed MSIR today.   Thornton Papas, MD  04/30/2012  6:15 PM

## 2012-04-30 NOTE — Telephone Encounter (Signed)
Per staff phone call and POF I have scheduled appts. [name]  

## 2012-05-03 ENCOUNTER — Telehealth: Payer: Self-pay | Admitting: *Deleted

## 2012-05-03 NOTE — Telephone Encounter (Signed)
Received call back from pt who states "my back is burning from the radiation". Pt states "Dr Newell Coral instructed her to call Dr Kathrynn Running and have cream ordered for her back". Pt states "her skin is on fire". Was unable to get clear explanation of exactly where pt is experiencing pain. Talked w/pt's friend, April Dobbins who states the pt's skin on her back "looks like it has burn marks on it, maybe little blisters forming. Pt cannot lie on her back due to pain.". She states the area is "hot to the touch". She states she thinks the pt is "feeling burning on the outside and inside". She states she applied aspercream to pt's back at pt's request but "that didn't help". Advised pt and her friend to apply lotion w/aloe and /or vitamin E, not to apply Aspercream again. Pt states she "wants to know what Dr Kathrynn Running wants her to do because Dr Newell Coral told her he'd order her a cream". Spoke w/Ms Paschal Dopp and informed her will route this to Dr Kathrynn Running and get back in touch with her. Informed her this RN leaves at 4 pm today but will let other RNs know of situation. Ms Paschal Dopp stated she would have pt call back at 4:30 pm today.

## 2012-05-03 NOTE — Telephone Encounter (Signed)
Per Dr. Broadus John order phoned patient back and requested she present on Wednesday, November 6 at 1330 for skin check. Explained to the patient that following only one treatment skin changes to the extent she is describing are no expected. Patient verbalized understanding and confirmed she would be present for the appointment. Routed this message to Dr. Kathrynn Running.

## 2012-05-04 ENCOUNTER — Other Ambulatory Visit: Payer: Self-pay | Admitting: Oncology

## 2012-05-04 ENCOUNTER — Other Ambulatory Visit: Payer: PRIVATE HEALTH INSURANCE

## 2012-05-04 ENCOUNTER — Telehealth: Payer: Self-pay | Admitting: Oncology

## 2012-05-04 ENCOUNTER — Ambulatory Visit: Payer: PRIVATE HEALTH INSURANCE

## 2012-05-04 ENCOUNTER — Encounter: Payer: Self-pay | Admitting: *Deleted

## 2012-05-04 ENCOUNTER — Telehealth: Payer: Self-pay | Admitting: *Deleted

## 2012-05-04 NOTE — Telephone Encounter (Signed)
Pt called and wants all chemo to be cancelled , she also wants chemo class to be cancelled, nurse notified

## 2012-05-04 NOTE — Telephone Encounter (Signed)
No other notes.   

## 2012-05-05 ENCOUNTER — Encounter: Payer: Self-pay | Admitting: Radiation Oncology

## 2012-05-05 ENCOUNTER — Ambulatory Visit
Admission: RE | Admit: 2012-05-05 | Discharge: 2012-05-05 | Disposition: A | Discharge status: Home / Self Care | Payer: Medicare HMO | Source: Ambulatory Visit | Attending: Radiation Oncology | Admitting: Radiation Oncology

## 2012-05-05 VITALS — BP 127/62 | HR 83 | Temp 98.6°F | Resp 20 | Wt 209.1 lb

## 2012-05-05 DIAGNOSIS — C50919 Malignant neoplasm of unspecified site of unspecified female breast: Secondary | ICD-10-CM | POA: Insufficient documentation

## 2012-05-05 DIAGNOSIS — C7949 Secondary malignant neoplasm of other parts of nervous system: Secondary | ICD-10-CM

## 2012-05-05 DIAGNOSIS — M545 Low back pain, unspecified: Secondary | ICD-10-CM

## 2012-05-05 DIAGNOSIS — Z853 Personal history of malignant neoplasm of breast: Secondary | ICD-10-CM

## 2012-05-05 DIAGNOSIS — C7931 Secondary malignant neoplasm of brain: Secondary | ICD-10-CM

## 2012-05-05 DIAGNOSIS — C7951 Secondary malignant neoplasm of bone: Secondary | ICD-10-CM

## 2012-05-05 MED ORDER — BIAFINE EX EMUL
Freq: Two times a day (BID) | CUTANEOUS | Status: DC
  Administered 2012-05-05: 14:00:00 via TOPICAL

## 2012-05-05 MED ORDER — GABAPENTIN 300 MG PO CAPS: 300.0000 mg | ORAL_CAPSULE | Freq: Three times a day (TID) | ORAL | Status: DC

## 2012-05-05 NOTE — Progress Notes (Signed)
Pt states her back "is burning from the top to middle". Today she states the burning "is not on the outside. It's inside." She rates pain 8/10, states she takes "Morphine 1 tab once in the morning and before she goes to bed". She states that is "the only pain medicine " she takes. She states when she takes the Morphine the pain decreases but "the burning is still there".  Pt states she had "applied Aspercreme but that made it worse". She has not applied any cream to skin past 2-3 days. No inflammation, redness, blisters, edema noted on pt's back. When undressing pt for gowning she was wincing.

## 2012-05-06 ENCOUNTER — Encounter: Payer: Self-pay | Admitting: Radiation Oncology

## 2012-05-06 NOTE — Progress Notes (Signed)
Radiation Oncology         [phone] ________________________________  Name: [name] [name] MRN: [id number]  Date: 05/05/2012  DOB: [birth year]  Follow-Up Visit Note  CC: Lorretta Harp, MD  Janan Halter, MD  Diagnosis:   66 yo woman with spinal involvement from metastatic breast cancer s/p spinal surgery with instrumentation and separation of tumor from the cord followed by stereotactic radiosurgery to the T3 and T4 vertebral bodies to a prescription dose of 18 Gy on 04/21/2012  Interval Since Last Radiation:  3 weeks  Narrative:  The patient returns today for routine follow-up.  Pt states her back "is burning from the top to middle". Today she states the burning "is not on the outside. It's inside." She rates pain 8/10, states she takes "Morphine 1 tab once in the morning and before she goes to bed". She states that is "the only pain medicine " she takes. She states when she takes the Morphine the pain decreases but "the burning is still there". Pt states she had "applied Aspercreme but that made it worse". She has not applied any cream to skin past 2-3 days.                               ALLERGIES:  is allergic to banana.  Meds: Current Outpatient Prescriptions  Medication Sig Dispense Refill  . albuterol (PROVENTIL HFA;VENTOLIN HFA) 108 (90 BASE) MCG/ACT inhaler Inhale 2 puffs into the lungs every 6 (six) hours as needed. Shortness of breath  1 Inhaler  3  . diazepam (VALIUM) 5 MG tablet Take 5-10 mg by mouth every 6 (six) hours as needed.      [name] morphine (MSIR) 15 MG tablet Take 1-2 tablets (15-30 mg total) by mouth every 4 (four) hours as needed for pain.  60 tablet  0  . SUMAtriptan (IMITREX) 50 MG tablet Take 50 mg by mouth daily as needed. For migraines      . gabapentin (NEURONTIN) 300 MG capsule Take 1 capsule (300 mg total) by mouth 3 (three) times daily. Take one the first day, then two the second day, then up to 3 times a day on the third day.  90 capsule  5  .  HYDROcodone-acetaminophen (VICODIN) 5-500 MG per tablet Take 1 tablet by mouth every 6 (six) hours as needed for pain.  90 tablet  5  . HYDROcodone-acetaminophen (VICODIN) 5-500 MG per tablet Take 1 tablet by mouth every 6 (six) hours as needed.      [name] oxyCODONE-acetaminophen (PERCOCET/ROXICET) 5-325 MG per tablet Take 1-2 tablets by mouth every 4 (four) hours as needed for pain.  60 tablet  0   Current Facility-Administered Medications  Medication Dose Route Frequency Provider Last Rate Last Dose  . [DISCONTINUED] topical emolient (BIAFINE) emulsion   Topical BID Oneita Hurt, MD        Physical Findings: The patient some obvious discomfort. Patient is alert and oriented. When undressing pt for gowning she was wincing.  weight is 209 lb 1.6 oz (94.847 kg). Her oral temperature is 98.6 F (37 C). Her blood pressure is 127/62 and her pulse is 83. Her respiration is 20. . IN the treatment area there is no inflammation, redness, blisters, edema. No radiation dermatitis or evidence of any wound infection.  No significant changes.  Impression:  The patient is recovering from the effects of radiation.  Her Skin reaction appears very mild in contrast  to her painful burning sensation.  The pain may be multifactorial with components of post-treatment neuropathy and a cutaneous dermatitis.  Plan:  Today, I started her on Gabapentin and topical Biafine.  If her pain does not start improving, she may benefit from spine CT or MRI.  _____________________________________  Artist Pais. Kathrynn Running, M.D.

## 2012-05-07 ENCOUNTER — Telehealth: Payer: Self-pay | Admitting: *Deleted

## 2012-05-07 NOTE — Telephone Encounter (Signed)
Order for [facility] for PT/OT/Safety Evaluation ?? Walker called & faxed to [facility] @ [phone] per Dr Truett Perna.

## 2012-05-11 ENCOUNTER — Ambulatory Visit: Payer: PRIVATE HEALTH INSURANCE

## 2012-05-11 ENCOUNTER — Encounter: Payer: PRIVATE HEALTH INSURANCE | Admitting: Nurse Practitioner

## 2012-05-11 ENCOUNTER — Other Ambulatory Visit: Payer: PRIVATE HEALTH INSURANCE | Admitting: Lab

## 2012-05-11 ENCOUNTER — Telehealth: Payer: Self-pay | Admitting: Oncology

## 2012-05-11 NOTE — Progress Notes (Signed)
[name] [name] did not keep her scheduled appointment today. I contacted her at home. She stated that after talking with her family she decided she did not want to proceed with chemotherapy. I inquired if she would be willing to proceed with Herceptin plus a hormonal agent. She will consider this. She would like to return for a followup visit with Dr. Truett Perna in approximately 2 weeks to discuss further. She did not want me to schedule any appointments in the infusion area. An electronic POF was submitted to schedule an appointment with Dr. Truett Perna in 2 weeks.

## 2012-05-11 NOTE — Telephone Encounter (Signed)
Called pt left message regarding appt on 11/26th, MD only

## 2012-05-14 ENCOUNTER — Other Ambulatory Visit: Payer: PRIVATE HEALTH INSURANCE | Admitting: Lab

## 2012-05-14 ENCOUNTER — Ambulatory Visit: Payer: PRIVATE HEALTH INSURANCE | Admitting: Nurse Practitioner

## 2012-05-18 ENCOUNTER — Other Ambulatory Visit: Payer: PRIVATE HEALTH INSURANCE | Admitting: Lab

## 2012-05-18 ENCOUNTER — Ambulatory Visit: Payer: PRIVATE HEALTH INSURANCE

## 2012-05-25 ENCOUNTER — Encounter: Payer: Self-pay | Admitting: Internal Medicine

## 2012-05-25 ENCOUNTER — Other Ambulatory Visit: Payer: Self-pay | Admitting: Internal Medicine

## 2012-05-25 ENCOUNTER — Other Ambulatory Visit: Payer: Self-pay | Admitting: *Deleted

## 2012-05-25 ENCOUNTER — Other Ambulatory Visit: Payer: PRIVATE HEALTH INSURANCE | Admitting: Lab

## 2012-05-25 ENCOUNTER — Telehealth: Payer: Self-pay | Admitting: Oncology

## 2012-05-25 ENCOUNTER — Ambulatory Visit (HOSPITAL_BASED_OUTPATIENT_CLINIC_OR_DEPARTMENT_OTHER): Payer: Medicare HMO | Admitting: Oncology

## 2012-05-25 ENCOUNTER — Ambulatory Visit: Payer: PRIVATE HEALTH INSURANCE

## 2012-05-25 VITALS — BP 146/70 | HR 83 | Temp 97.7°F | Resp 20 | Ht 68.0 in | Wt 205.7 lb

## 2012-05-25 DIAGNOSIS — C7952 Secondary malignant neoplasm of bone marrow: Secondary | ICD-10-CM

## 2012-05-25 DIAGNOSIS — C781 Secondary malignant neoplasm of mediastinum: Secondary | ICD-10-CM

## 2012-05-25 DIAGNOSIS — C50919 Malignant neoplasm of unspecified site of unspecified female breast: Secondary | ICD-10-CM

## 2012-05-25 DIAGNOSIS — C7931 Secondary malignant neoplasm of brain: Secondary | ICD-10-CM

## 2012-05-25 DIAGNOSIS — C787 Secondary malignant neoplasm of liver and intrahepatic bile duct: Secondary | ICD-10-CM

## 2012-05-25 DIAGNOSIS — C7951 Secondary malignant neoplasm of bone: Secondary | ICD-10-CM

## 2012-05-25 MED ORDER — MORPHINE SULFATE 15 MG PO TABS: 15.0000 mg | ORAL_TABLET | ORAL | Status: DC | PRN

## 2012-05-25 NOTE — Telephone Encounter (Signed)
appts made and printed for pt  °

## 2012-05-25 NOTE — Progress Notes (Signed)
[facility]    OFFICE PROGRESS NOTE   INTERVAL HISTORY:   She returns as scheduled. She reports a marketed improvement in the gait instability. She is completing a home physical therapy program. The pain is relieved with a proximally to MSIR tablets per day. When she was here on 04/30/2012 we discussed systemic therapy options and she was scheduled for Taxol/Herceptin. Ms. Vincent decided against chemotherapy. She will not agree to a Port-A-Cath and is not ready to begin systemic therapy.   Objective:  Vital signs in last 24 hours:  Blood pressure 146/70, pulse 83, temperature 97.7 F (36.5 C), temperature source Oral, resp. rate 20, height 5\' 8"  (1.727 m), weight 205 lb 11.2 oz (93.305 kg).    HEENT: Neck without mass Lymphatics: No cervical, supraclavicular, or axillary nodes Resp: Decreased breath sounds at the left upper posterior chest, no respiratory distress Cardio: Regular rate and rhythm GI: No hepatomegaly Vascular: The left lower leg is slightly larger than the right side, no edema Neuro: Alert and oriented, she ambulates without difficulty  Skin: 2 cm crusted indurated plaque at the left anterior chest wall     Medications: I have reviewed the patient's current medications.  Assessment/Plan: 1.Metastatic breast cancer - initially diagnosed with breast cancer in 1996 status post left mastectomy and axillary lymph node dissection. She developed a chest wall recurrence in 2001 and completed radiation. In April 2003, CT showed mediastinal, internal mammary, and right axillary adenopathy and two liver lesions. MRI of the thoracic spine June 18, 2002 revealed osseous abnormalities at T3 and T4 suspicious for metastatic disease with extension into the T3 pedicle. There was extra osseous extension compressing and displacing the thoracic cord. She completed radiation from T1 through T6 June 21, 2002 through July 13, 2002. CT scan Nov 11, 2002 showed  progression of metastatic disease with precardiac nodes, left pleural and lower lobe disease, subcutaneous nodules at the left anterior chest wall and progressive metastatic disease involving the liver. She began Femara in May/early June 2004 with subsequent resolution of chest wall nodules. CT scans January 14, 2005, of the chest and abdomen revealed findings of decreased pleural-based metastatic disease in the lower left hemithorax and no new or progressive disease in the chest. Small lesion in the right liver measuring 13.0 x 10.0 mm was reported as stable when compared to her previous study. No other liver lesions were seen. Bone scan January 22, 2006 showed degenerative changes. She discontinued Femara February 2009 and was lost a followup. She presented in March 2010 with an ulcerated chest wall lesion and an elevated CA27.29. Restaging CTs September 18, 2008 showed a 1.3 x 2.2 cm subdermal lesion at the left anterior chest, multiple enlarged metastases in the left hemithorax at the mediastinum and pleura felt to represent necrotic lymph nodes versus pleural metastases. Right axillary lymph node was decreased in size compared to 2006. There were no suspicious bone lesions. A previously noted lesion in the inferior right liver was not clearly seen. She resumed Femara March 2010. On October 23, 2008 the chest wall lesion had improved. The CA27.29 on October 23, 2008 was stable at 60. She discontinued Femara in June 2010 and CTs of the abdomen and pelvis April 06, 2009 showed slight decrease in the size of a pleural-based nodule in the left lower lobe. There was no other evidence for metastatic disease in the abdomen or pelvis. She resumed Femara on April 11, 2009. CA27.29 returned at 87 on April 11, 2009.  CT of the abdomen and pelvis on May 30, 2009 showed an increase in multiple pericardial masses, increased left chest wall subcutaneous nodule, stable pleural-based metastases and stable liver lesions. She  discontinued Femara in November 2010 due to nausea and vomiting. Femara was resumed following an office visit June 06, 2009. Dr. Myrle Sheng reviewed a CT from May 30, 2009 with a [name] radiologist comparing the scan to scans from March 2010 and October 2010. In addition, a CT scan from 2006 was reviewed. The pleural-based lesions in the left chest had not changed significantly since March 2010. The pericardial masses appeared slightly larger. Both the pericardial masses and pleural-based masses were larger compared to the CT from 2006. She discontinued Femara January 2011. The left chest wall nodule recurred during the time she was off of Femara. Femara was resumed following an office visit 10/02/2009. The nodule again improved. Restaging CT of the chest 08/16/2010 was stable compared to a CT from 03/29/2010. Most recent CA 27-29 on 01/16/2012 was stable at 340. Femara was continued. CT the chest 02/23/2012 with progressive pleural-based tumor in the left chest with probable involvement of T3. Femara was discontinued. MRI of the thoracic spine on 03/05/2012 showed severe T3 and T4 metastatic disease with mild pathologic compression fractures and epidural extension causing some cord compression at T3. There was no definite cord edema. There was associated extensive bilateral neural foraminal involvement at the T2 and T3 nerve levels greater on the left. There was associated very bulky mediastinal and pleural metastatic disease. She underwent a laminectomy procedure on 04/01/2012 with resection of epidural metastatic tumor. 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 The tumor was ER positive 100%; PR positive 53%; HER-2/neu by CISH showed amplification.  2. Ulcerated left chest lesion, likely a metastatic lesion. Stable.  3. Severe right low back/buttock pain radiating to the left leg October 2010 with MRI on April 06, 2009 revealing a disk protrusion at L5-S1 displacing  the right S1 nerve root. She was evaluated by Dr. Jordan Likes. The pain has resolved.  4. Hospitalization December 1 through May 31, 2009 with nausea, vomiting and abdominal pain - resolved.  5. 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  6. Pain at the right back/ iliac region October 25, 2008.  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  8. Status post left knee replacement.  9. Chronic edema of the left lower leg.  10. "Migraines". She is taking Imitrex.  11. Falls and balance problems occurring over the past year. She has been referred to neurology.  12. Right breast with area of firmness on exam March 2013 -negative right breast mammogram and ultrasound 09/15/2011  13. Pain at upper back. Resolved following the laminectomy procedure.  14. Unsteady gait on 04/14/2012-much improved, she continues a home physical therapy program  Disposition:  Ms. Mctier has an improved performance status. We discuss systemic treatment options. She is not ready to begin systemic therapy. She understands the significant chance of clinical disease progression in the absence of systemic therapy. We also discussed the significant chance of obtaining a clinical remission with Herceptin based therapy. We discussed potential toxicities associated with Herceptin again today and she was given reading materials. She is reluctant to receive intravenous chemotherapy. I decided to recommend capecitabine to be given in addition to Herceptin. I reviewed the potential toxicities associated with capecitabine including the chance for mucositis, diarrhea, and hematologic toxicity. We discussed the skin  rash, hyperpigmentation, and hand/foot syndrome associated with capecitabine.  Ms. Verdoorn agrees to a 2 week followup visit for further discussion. She will not agree to systemic therapy at present.   Thornton Papas, MD  05/25/2012  2:08 PM

## 2012-05-30 ENCOUNTER — Encounter: Payer: Self-pay | Admitting: Radiation Oncology

## 2012-05-30 NOTE — Progress Notes (Signed)
  Radiation Oncology         [phone] ________________________________  Name: [name] [name] MRN: [id number]  Date: 05/31/2012  DOB: 1945-08-19  Follow-Up Visit Note  CC: Lorretta Harp, MD  Janan Halter, MD  Diagnosis:   66 yo woman with spinal involvement from metastatic breast cancer s/p spinal surgery with instrumentation and separation of tumor from the cord followed by stereotactic radiosurgery to the T3 and T4 vertebral bodies to a prescription dose of 18 Gy on 04/21/2012  Interval Since Last Radiation:  1 months  Narrative:  The patient returns today for routine follow-up.  She presented to our clinic in early November with burning pain in the back.  This has improved with Biafene and Neurontin.                              ALLERGIES:  is allergic to banana.  Meds: Current Outpatient Prescriptions  Medication Sig Dispense Refill  . albuterol (PROVENTIL HFA;VENTOLIN HFA) 108 (90 BASE) MCG/ACT inhaler Inhale 2 puffs into the lungs every 6 (six) hours as needed. Shortness of breath  1 Inhaler  3  . diazepam (VALIUM) 5 MG tablet Take 5-10 mg by mouth every 6 (six) hours as needed.      . gabapentin (NEURONTIN) 300 MG capsule Take 1 capsule (300 mg total) by mouth 3 (three) times daily. Take one the first day, then two the second day, then up to 3 times a day on the third day.  90 capsule  5  . morphine (MSIR) 15 MG tablet Take 1-2 tablets (15-30 mg total) by mouth every 4 (four) hours as needed for pain.  60 tablet  0  . oxyCODONE-acetaminophen (PERCOCET/ROXICET) 5-325 MG per tablet Take 1-2 tablets by mouth every 4 (four) hours as needed for pain.  60 tablet  0  . SUMAtriptan (IMITREX) 50 MG tablet Take 50 mg by mouth daily as needed. For migraines        Physical Findings: The patient is in no acute distress. Patient is alert and oriented.  weight is 204 lb (92.534 kg). Her oral temperature is 98.3 F (36.8 C). Her blood pressure is 146/80 and her pulse is 87. Her respiration  is 20. .Skin is intact on back with no hyperpigmentation. Speech is fluent and articulate.  Motor strength intact in all 4 extremities.  Light touch intact throughout.  Cranial nerves intact.  Gait steady.  No significant changes.  Impression:  The patient is recovering from the effects of radiation.  Plan:  She will follow-up in 2 months.  _____________________________________  Artist Pais. Kathrynn Running, M.D.

## 2012-05-31 ENCOUNTER — Encounter: Payer: Self-pay | Admitting: Radiation Oncology

## 2012-05-31 ENCOUNTER — Ambulatory Visit
Admission: RE | Admit: 2012-05-31 | Discharge: 2012-05-31 | Disposition: A | Discharge status: Home / Self Care | Payer: PRIVATE HEALTH INSURANCE | Source: Ambulatory Visit | Attending: Radiation Oncology | Admitting: Radiation Oncology

## 2012-05-31 VITALS — BP 146/80 | HR 87 | Temp 98.3°F | Resp 20 | Wt 204.0 lb

## 2012-05-31 DIAGNOSIS — Z8669 Personal history of other diseases of the nervous system and sense organs: Secondary | ICD-10-CM | POA: Insufficient documentation

## 2012-05-31 DIAGNOSIS — M792 Neuralgia and neuritis, unspecified: Secondary | ICD-10-CM | POA: Insufficient documentation

## 2012-05-31 DIAGNOSIS — M659 Synovitis and tenosynovitis, unspecified: Secondary | ICD-10-CM | POA: Insufficient documentation

## 2012-05-31 DIAGNOSIS — C7949 Secondary malignant neoplasm of other parts of nervous system: Secondary | ICD-10-CM

## 2012-05-31 NOTE — Progress Notes (Signed)
Pt doing PT twice weekly, and twice each day PT comes. She takes MSIR x 1 on days she receives PT. She reports "I am still real sore in my back where I was treated." Pt states Biafine lotion relieved her skin burning/irritation, continues taking Gabapentin. Pt states she occasionally "feels as though something is in her throat making it hard to swallow".  Reports energy improving, appetite good.

## 2012-06-08 ENCOUNTER — Ambulatory Visit (HOSPITAL_BASED_OUTPATIENT_CLINIC_OR_DEPARTMENT_OTHER): Payer: Medicaid Other | Admitting: Nurse Practitioner

## 2012-06-08 ENCOUNTER — Telehealth: Payer: Self-pay | Admitting: Oncology

## 2012-06-08 VITALS — BP 152/84 | HR 86 | Temp 97.8°F | Resp 18 | Ht 68.0 in | Wt 202.5 lb

## 2012-06-08 DIAGNOSIS — M545 Low back pain: Secondary | ICD-10-CM

## 2012-06-08 DIAGNOSIS — C50919 Malignant neoplasm of unspecified site of unspecified female breast: Secondary | ICD-10-CM

## 2012-06-08 NOTE — Telephone Encounter (Signed)
gv pt appt schedule for January 2014. Pt aware I will call re echo appt. Copy of echo order to [facility] for precert.

## 2012-06-08 NOTE — Progress Notes (Signed)
OFFICE PROGRESS NOTE  Interval history:  [name] [name] returns as scheduled. Gait continues to be improved. She is working with physical therapy. She periodically has pain at the upper and mid back. The pain "comes and goes" and tends to occur when she gets up in the morning and following therapy. She has some Valium that she would like to try as she feels the pain is related to "muscle spasms". Appetite varies. She denies nausea/vomiting. Bowels moving regularly. No hematuria or dysuria.   Objective: Blood pressure 152/84, pulse 86, temperature 97.8 F (36.6 C), temperature source Oral, resp. rate 18, height 5\' 8"  (1.727 m), weight 202 lb 8 oz (91.853 kg).  Oropharynx is without thrush or ulceration. Small palpable left supraclavicular lymph node. Question palpable right axillary lymph node. Lungs are clear. Breath sounds diminished at the left upper posterior chest. Regular cardiac rhythm. Abdomen is soft and nontender. Extremities are without edema. Persistent 2 cm crusted indurated plaque at the left anterior chest wall.  Lab Results: Lab Results  Component Value Date   WBC 5.6 04/30/2012   HGB 10.5* 04/30/2012   HCT 31.4* 04/30/2012   MCV 90.8 04/30/2012   PLT 316 04/30/2012    Chemistry:    Chemistry      Component Value Date/Time   NA 141 04/30/2012 1046   NA 139 04/01/2012 1222   K 3.9 04/30/2012 1046   K 3.8 04/01/2012 1222   CL 105 04/30/2012 1046   CL 105 03/25/2012 0935   CO2 29 04/30/2012 1046   CO2 30 03/25/2012 0935   BUN 12.0 04/30/2012 1046   BUN 12 03/25/2012 0935   CREATININE 0.8 04/30/2012 1046   CREATININE 0.84 03/25/2012 0935   CREATININE 0.89 10/23/2010 1514      Component Value Date/Time   CALCIUM 9.6 04/30/2012 1046   CALCIUM 9.6 03/25/2012 0935   ALKPHOS 88 04/30/2012 1046   ALKPHOS 86 10/23/2010 1514   AST 10 04/30/2012 1046   AST 11 10/23/2010 1514   ALT 11 04/30/2012 1046   ALT 8 10/23/2010 1514   BILITOT 0.27 04/30/2012 1046   BILITOT 0.3 10/23/2010 1514        Studies/Results: No results found.  Medications: I have reviewed the patient's current medications.  Assessment/Plan:  1.Metastatic breast cancer - initially diagnosed with breast cancer in 1996 status post left mastectomy and axillary lymph node dissection. She developed a chest wall recurrence in 2001 and completed radiation. In April 2003, CT showed mediastinal, internal mammary, and right axillary adenopathy and two liver lesions. MRI of the thoracic spine June 18, 2002 revealed osseous abnormalities at T3 and T4 suspicious for metastatic disease with extension into the T3 pedicle. There was extra osseous extension compressing and displacing the thoracic cord. She completed radiation from T1 through T6 June 21, 2002 through July 13, 2002. CT scan Nov 11, 2002 showed progression of metastatic disease with precardiac nodes, left pleural and lower lobe disease, subcutaneous nodules at the left anterior chest wall and progressive metastatic disease involving the liver. She began Femara in May/early June 2004 with subsequent resolution of chest wall nodules. CT scans January 14, 2005, of the chest and abdomen revealed findings of decreased pleural-based metastatic disease in the lower left hemithorax and no new or progressive disease in the chest. Small lesion in the right liver measuring 13.0 x 10.0 mm was reported as stable when compared to her previous study. No other liver lesions were seen. Bone scan January 22, 2006 showed degenerative changes.  She discontinued Femara February 2009 and was lost a followup. She presented in March 2010 with an ulcerated chest wall lesion and an elevated CA27.29. Restaging CTs September 18, 2008 showed a 1.3 x 2.2 cm subdermal lesion at the left anterior chest, multiple enlarged metastases in the left hemithorax at the mediastinum and pleura felt to represent necrotic lymph nodes versus pleural metastases. Right axillary lymph node was decreased in size compared to  2006. There were no suspicious bone lesions. A previously noted lesion in the inferior right liver was not clearly seen. She resumed Femara March 2010. On October 23, 2008 the chest wall lesion had improved. The CA27.29 on October 23, 2008 was stable at 60. She discontinued Femara in June 2010 and CTs of the abdomen and pelvis April 06, 2009 showed slight decrease in the size of a pleural-based nodule in the left lower lobe. There was no other evidence for metastatic disease in the abdomen or pelvis. She resumed Femara on April 11, 2009. CA27.29 returned at 87 on April 11, 2009. CT of the abdomen and pelvis on May 30, 2009 showed an increase in multiple pericardial masses, increased left chest wall subcutaneous nodule, stable pleural-based metastases and stable liver lesions. She discontinued Femara in November 2010 due to nausea and vomiting. Femara was resumed following an office visit June 06, 2009. Dr. Myrle Sheng reviewed a CT from May 30, 2009 with a [name] radiologist comparing the scan to scans from March 2010 and October 2010. In addition, a CT scan from 2006 was reviewed. The pleural-based lesions in the left chest had not changed significantly since March 2010. The pericardial masses appeared slightly larger. Both the pericardial masses and pleural-based masses were larger compared to the CT from 2006. She discontinued Femara January 2011. The left chest wall nodule recurred during the time she was off of Femara. Femara was resumed following an office visit 10/02/2009. The nodule again improved. Restaging CT of the chest 08/16/2010 was stable compared to a CT from 03/29/2010. Most recent CA 27-29 on 01/16/2012 was stable at 340. Femara was continued. CT the chest 02/23/2012 with progressive pleural-based tumor in the left chest with probable involvement of T3. Femara was discontinued. MRI of the thoracic spine on 03/05/2012 showed severe T3 and T4 metastatic disease with mild pathologic  compression fractures and epidural extension causing some cord compression at T3. There was no definite cord edema. There was associated extensive bilateral neural foraminal involvement at the T2 and T3 nerve levels greater on the left. There was associated very bulky mediastinal and pleural metastatic disease. She underwent a laminectomy procedure on 04/01/2012 with resection of epidural metastatic tumor. 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 The tumor was ER positive 100%; PR positive 53%; HER-2/neu by CISH showed amplification.  2. Ulcerated left chest lesion, likely a metastatic lesion. Stable.  3. Severe right low back/buttock pain radiating to the left leg October 2010 with MRI on April 06, 2009 revealing a disk protrusion at L5-S1 displacing the right S1 nerve root. She was evaluated by Dr. Jordan Likes. The pain has resolved.  4. Hospitalization December 1 through May 31, 2009 with nausea, vomiting and abdominal pain - resolved.  5. 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  6. Pain at the right back/ iliac region October 25, 2008.  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  8. Status post left knee replacement.  9. Chronic edema of the left  lower leg.  10. "Migraines". She takes Imitrex as needed.  11. Falls and balance problems occurring over the past year. She has been referred to neurology.  12. Right breast with area of firmness on exam March 2013 -negative right breast mammogram and ultrasound 09/15/2011  13. Pain at upper back. Resolved following the laminectomy procedure. She is having intermittent pain at the mid to upper back. She will try Valium. 14. Unsteady gait on 04/14/2012-much improved, she continues a home physical therapy program.  Disposition-[name] [name] appears stable. She has decided to proceed with systemic therapy with Xeloda and Herceptin. We again discussed  potential toxicities associated with both. She does not want to begin treatment until January 2014. We are referring her for a chemotherapy class and baseline 2-D echo on 07/01/2012. She will return for a followup visit with Dr. Truett Perna on 07/05/2012 with plans to proceed with cycle 1 Xeloda and Herceptin that day. She will contact the office in the interim with any problems.  Plan reviewed with Dr. Truett Perna.   Lonna Cobb ANP/GNP-BC

## 2012-06-09 ENCOUNTER — Telehealth: Payer: Self-pay | Admitting: *Deleted

## 2012-06-09 NOTE — Telephone Encounter (Signed)
Spoke with patient by study today to provide a brief overview of the observational [employer] [employer] [id number] [employer] Registry trial. Patient is interested in participating. Copy of consent form will be mailed to patient for her review, along with my business card. Plans made to meet with patient at her next MD visit on January 6th. Thanked patient for her willingness to consider participating in this study.

## 2012-06-17 ENCOUNTER — Telehealth: Payer: Self-pay | Admitting: Oncology

## 2012-06-17 NOTE — Telephone Encounter (Signed)
lmonvm for pt re echo appt for 07/01/12 @ 9am. Also confirmed appts for 1/2 and 1/6 in our office. Pt was given schedule prior to leaving 12/10 and made aware that I would call her w/echo appt.

## 2012-06-21 ENCOUNTER — Telehealth: Payer: Self-pay | Admitting: *Deleted

## 2012-06-21 NOTE — Telephone Encounter (Signed)
Per Dr. Truett Perna may take 30 mg MSIR every 4 hours prn pain. IF she chooses to only take twice daily, then OK to take Valium mid day to see if this helps her pain. Only way to get pain controlled ultimately is chemotherapy. She understands this and agrees to ECHO, chemo class and chemo. She will call back if not better by end of the week. MD will begin process to order Xeloda.

## 2012-06-21 NOTE — Telephone Encounter (Signed)
Reports no improvement in her back pain from neck to center of her back. Reports her muscles constantly feel tight despite the MSIR 15 mg (only takes twice daily because she does not want to be drowsy); Neurontin 300 mg bid. She stopped taking the Valium when she got the morphine started. Reports she is using heat, her exercises as instructed with no relief in pain. Suggested she try taking #2 of the MSIR (script reads 1-2 tabs prn). Asking about use of valium when she is on the morphine? Made her aware that most pain meds and muscle relaxants have sedation as an expected side effect and she may not be able to avoid this.

## 2012-06-22 ENCOUNTER — Other Ambulatory Visit: Payer: Self-pay | Admitting: *Deleted

## 2012-06-22 DIAGNOSIS — C50919 Malignant neoplasm of unspecified site of unspecified female breast: Secondary | ICD-10-CM

## 2012-06-22 MED ORDER — CAPECITABINE 500 MG PO TABS: ORAL_TABLET | ORAL | Status: DC

## 2012-06-22 NOTE — Telephone Encounter (Signed)
Consent delivered to education nurse for signature after chemo class.

## 2012-06-24 ENCOUNTER — Telehealth: Payer: Self-pay | Admitting: *Deleted

## 2012-06-24 NOTE — Telephone Encounter (Signed)
RECEIVED A FAX FROM [facility] CONCERNING A NOTICE OF RX TRANSFER TO [facility]. PHONE #[phone]/ FAX #[phone].

## 2012-06-24 NOTE — Telephone Encounter (Signed)
Insurance required Xeloda be transferred to [facility]. This was done. Will send contact info for [facility] via fax.

## 2012-06-25 NOTE — Telephone Encounter (Signed)
RECEIVED A FAX FROM [facility] CONCERNING A CONFIRMATION OF PT. REFERRAL. 

## 2012-06-28 ENCOUNTER — Other Ambulatory Visit: Payer: Self-pay | Admitting: Oncology

## 2012-06-29 ENCOUNTER — Encounter: Payer: Self-pay | Admitting: *Deleted

## 2012-06-29 NOTE — Progress Notes (Signed)
Faxed notification from [facility] that Xeloda 500 mg script has now been forwarded to [facility] at phone #[phone]

## 2012-07-01 ENCOUNTER — Ambulatory Visit (HOSPITAL_COMMUNITY)
Admission: RE | Admit: 2012-07-01 | Discharge: 2012-07-01 | Disposition: A | Discharge status: Home / Self Care | Payer: Medicare HMO | Source: Ambulatory Visit | Attending: Oncology | Admitting: Oncology

## 2012-07-01 ENCOUNTER — Other Ambulatory Visit: Payer: Medicare HMO

## 2012-07-01 ENCOUNTER — Encounter: Payer: Self-pay | Admitting: *Deleted

## 2012-07-01 DIAGNOSIS — E785 Hyperlipidemia, unspecified: Secondary | ICD-10-CM | POA: Insufficient documentation

## 2012-07-01 DIAGNOSIS — I10 Essential (primary) hypertension: Secondary | ICD-10-CM | POA: Insufficient documentation

## 2012-07-01 DIAGNOSIS — F17200 Nicotine dependence, unspecified, uncomplicated: Secondary | ICD-10-CM | POA: Insufficient documentation

## 2012-07-01 DIAGNOSIS — C7951 Secondary malignant neoplasm of bone: Secondary | ICD-10-CM | POA: Insufficient documentation

## 2012-07-01 DIAGNOSIS — C50919 Malignant neoplasm of unspecified site of unspecified female breast: Secondary | ICD-10-CM | POA: Insufficient documentation

## 2012-07-01 DIAGNOSIS — Z9010 Acquired absence of unspecified breast and nipple: Secondary | ICD-10-CM | POA: Insufficient documentation

## 2012-07-01 NOTE — Progress Notes (Signed)
*  PRELIMINARY RESULTS* Echocardiogram 2D Echocardiogram has been performed.  [name] [name] 07/01/2012, 10:16 AM

## 2012-07-03 ENCOUNTER — Other Ambulatory Visit: Payer: Self-pay | Admitting: Oncology

## 2012-07-05 ENCOUNTER — Other Ambulatory Visit: Payer: Medicare HMO | Admitting: Lab

## 2012-07-05 ENCOUNTER — Other Ambulatory Visit: Payer: Self-pay | Admitting: *Deleted

## 2012-07-05 ENCOUNTER — Ambulatory Visit: Payer: Medicare HMO | Admitting: Oncology

## 2012-07-05 ENCOUNTER — Telehealth: Payer: Self-pay | Admitting: Oncology

## 2012-07-05 ENCOUNTER — Ambulatory Visit: Payer: Medicare HMO

## 2012-07-05 DIAGNOSIS — C7951 Secondary malignant neoplasm of bone: Secondary | ICD-10-CM

## 2012-07-05 DIAGNOSIS — C50919 Malignant neoplasm of unspecified site of unspecified female breast: Secondary | ICD-10-CM

## 2012-07-05 MED ORDER — MORPHINE SULFATE 15 MG PO TABS: 15.0000 mg | ORAL_TABLET | ORAL | Status: DC | PRN

## 2012-07-05 NOTE — Telephone Encounter (Signed)
Needs refill on her MSIR and agrees to reschedule to see Dr. Truett Perna, but says she can't face chemo right now. When nurse asked her what will she do to fight the cancer, she responds "I'll just pray about it". Reminding her that God works through people and medicine she understands, but does not want to pursue chemo at this time.  Will call her when the [facility] script is ready.

## 2012-07-05 NOTE — Telephone Encounter (Signed)
Pt came today and she wanted to talked to nurse but has to leave due to her ride. She will call [name] [name] regarding medicine. She is aware that we will be calling to r/s her appts and chemo

## 2012-07-07 ENCOUNTER — Telehealth: Payer: Self-pay | Admitting: Oncology

## 2012-07-07 NOTE — Telephone Encounter (Signed)
Talked to patient and gave her appt for 07/12/12 lab and MD

## 2012-07-09 ENCOUNTER — Telehealth: Payer: Self-pay | Admitting: *Deleted

## 2012-07-09 NOTE — Telephone Encounter (Signed)
Pt's Xeloda rx was transferred from [facility] to [facility]. Pt declined to sign consent form for Xeloda after chemo teaching class. Next office visit 07/12/12.  Received message from [facility] to follow up on prescription request. Will fax rx to [facility] after visit if pt is agreeable to treatment. Called pharmacy to make them aware.

## 2012-07-12 ENCOUNTER — Encounter: Payer: Self-pay | Admitting: *Deleted

## 2012-07-12 ENCOUNTER — Encounter: Payer: Self-pay | Admitting: Nurse Practitioner

## 2012-07-12 ENCOUNTER — Ambulatory Visit: Payer: Medicare HMO

## 2012-07-12 ENCOUNTER — Other Ambulatory Visit: Payer: Self-pay | Admitting: *Deleted

## 2012-07-12 ENCOUNTER — Telehealth: Payer: Self-pay | Admitting: Oncology

## 2012-07-12 ENCOUNTER — Ambulatory Visit (HOSPITAL_BASED_OUTPATIENT_CLINIC_OR_DEPARTMENT_OTHER): Payer: Medicare HMO | Admitting: Nurse Practitioner

## 2012-07-12 ENCOUNTER — Other Ambulatory Visit (HOSPITAL_BASED_OUTPATIENT_CLINIC_OR_DEPARTMENT_OTHER): Payer: Medicare HMO | Admitting: Lab

## 2012-07-12 VITALS — BP 165/80 | HR 70 | Temp 98.3°F | Resp 20 | Ht 68.0 in | Wt 204.0 lb

## 2012-07-12 DIAGNOSIS — C50919 Malignant neoplasm of unspecified site of unspecified female breast: Secondary | ICD-10-CM

## 2012-07-12 DIAGNOSIS — Z5111 Encounter for antineoplastic chemotherapy: Secondary | ICD-10-CM

## 2012-07-12 DIAGNOSIS — C781 Secondary malignant neoplasm of mediastinum: Secondary | ICD-10-CM

## 2012-07-12 DIAGNOSIS — C7951 Secondary malignant neoplasm of bone: Secondary | ICD-10-CM

## 2012-07-12 DIAGNOSIS — C7952 Secondary malignant neoplasm of bone marrow: Secondary | ICD-10-CM

## 2012-07-12 DIAGNOSIS — C787 Secondary malignant neoplasm of liver and intrahepatic bile duct: Secondary | ICD-10-CM

## 2012-07-12 HISTORY — DX: Malignant neoplasm of unspecified site of unspecified female breast: C50.919

## 2012-07-12 LAB — COMPREHENSIVE METABOLIC PANEL (CC13)
ALT: 7 U/L (ref 0–55)
AST: 13 U/L (ref 5–34)
Albumin: 3.2 g/dL — ABNORMAL LOW (ref 3.5–5.0)
CO2: 28 mEq/L (ref 22–29)
Calcium: 9.4 mg/dL (ref 8.4–10.4)
Chloride: 106 mEq/L (ref 98–107)
Potassium: 3.9 mEq/L (ref 3.5–5.1)
Sodium: 140 mEq/L (ref 136–145)
Total Protein: 7.2 g/dL (ref 6.4–8.3)

## 2012-07-12 LAB — CANCER ANTIGEN 27.29: CA 27.29: 271 U/mL — ABNORMAL HIGH (ref 0–39)

## 2012-07-12 LAB — CBC WITH DIFFERENTIAL/PLATELET
BASO%: 0.4 % (ref 0.0–2.0)
Basophils Absolute: 0 10e3/uL (ref 0.0–0.1)
EOS%: 0.9 % (ref 0.0–7.0)
Eosinophils Absolute: 0 10e3/uL (ref 0.0–0.5)
HCT: 35.3 % (ref 34.8–46.6)
HGB: 11.3 g/dL — ABNORMAL LOW (ref 11.6–15.9)
LYMPH%: 25.4 % (ref 14.0–49.7)
MCH: 28.4 pg (ref 25.1–34.0)
MCHC: 32 g/dL (ref 31.5–36.0)
MCV: 88.8 fL (ref 79.5–101.0)
MONO#: 0.6 10e3/uL (ref 0.1–0.9)
MONO%: 11.4 % (ref 0.0–14.0)
NEUT#: 3.4 10e3/uL (ref 1.5–6.5)
NEUT%: 61.9 % (ref 38.4–76.8)
Platelets: 235 10e3/uL (ref 145–400)
RBC: 3.98 10e6/uL (ref 3.70–5.45)
RDW: 15.6 % — ABNORMAL HIGH (ref 11.2–14.5)
WBC: 5.5 10e3/uL (ref 3.9–10.3)
lymph#: 1.4 10e3/uL (ref 0.9–3.3)

## 2012-07-12 MED ORDER — FULVESTRANT 250 MG/5ML IM SOLN
500.0000 mg | INTRAMUSCULAR | Status: DC
  Administered 2012-07-12: 500 mg via INTRAMUSCULAR
  Filled 2012-07-12: qty 10

## 2012-07-12 NOTE — Progress Notes (Signed)
OFFICE PROGRESS NOTE  Interval history:  [name] [name] returns as scheduled. She overall is feeling well. Back pain is well-controlled. She is walking without difficulty. She has a good appetite. She denies shortness of breath.  She has decided against Xeloda and Herceptin. She attended the chemotherapy education class and is worried about the potential side effects associated with the treatments.   Objective: Blood pressure 165/80, pulse 70, temperature 98.3 F (36.8 C), temperature source Oral, resp. rate 20, height 5\' 8"  (1.727 m), weight 204 lb (92.534 kg).  Oropharynx is without thrush or ulceration. Lungs are clear. Breath sounds at the left upper posterior lung field. Regular cardiac rhythm. Abdomen is soft and nontender. No hepatomegaly. Extremities are without edema. 2 cm crusted indurated plaque at the left anterior chest wall. She ambulates without difficulty.  Lab Results: Lab Results  Component Value Date   WBC 5.5 07/12/2012   HGB 11.3* 07/12/2012   HCT 35.3 07/12/2012   MCV 88.8 07/12/2012   PLT 235 07/12/2012    Chemistry:    Chemistry      Component Value Date/Time   NA 140 07/12/2012 1437   NA 139 04/01/2012 1222   K 3.9 07/12/2012 1437   K 3.8 04/01/2012 1222   CL 106 07/12/2012 1437   CL 105 03/25/2012 0935   CO2 28 07/12/2012 1437   CO2 30 03/25/2012 0935   BUN 14.0 07/12/2012 1437   BUN 12 03/25/2012 0935   CREATININE 0.9 07/12/2012 1437   CREATININE 0.84 03/25/2012 0935   CREATININE 0.89 10/23/2010 1514      Component Value Date/Time   CALCIUM 9.4 07/12/2012 1437   CALCIUM 9.6 03/25/2012 0935   ALKPHOS 100 07/12/2012 1437   ALKPHOS 86 10/23/2010 1514   AST 13 07/12/2012 1437   AST 11 10/23/2010 1514   ALT 7 07/12/2012 1437   ALT 8 10/23/2010 1514   BILITOT 0.29 07/12/2012 1437   BILITOT 0.3 10/23/2010 1514       Studies/Results: No results found.  Medications: I have reviewed the patient's current medications.  Assessment/Plan:  1.Metastatic breast cancer -  initially diagnosed with breast cancer in 1996 status post left mastectomy and axillary lymph node dissection. She developed a chest wall recurrence in 2001 and completed radiation. In April 2003, CT showed mediastinal, internal mammary, and right axillary adenopathy and two liver lesions. MRI of the thoracic spine June 18, 2002 revealed osseous abnormalities at T3 and T4 suspicious for metastatic disease with extension into the T3 pedicle. There was extra osseous extension compressing and displacing the thoracic cord. She completed radiation from T1 through T6 June 21, 2002 through July 13, 2002. CT scan Nov 11, 2002 showed progression of metastatic disease with precardiac nodes, left pleural and lower lobe disease, subcutaneous nodules at the left anterior chest wall and progressive metastatic disease involving the liver. She began Femara in May/early June 2004 with subsequent resolution of chest wall nodules. CT scans January 14, 2005, of the chest and abdomen revealed findings of decreased pleural-based metastatic disease in the lower left hemithorax and no new or progressive disease in the chest. Small lesion in the right liver measuring 13.0 x 10.0 mm was reported as stable when compared to her previous study. No other liver lesions were seen. Bone scan January 22, 2006 showed degenerative changes. She discontinued Femara February 2009 and was lost a followup. She presented in March 2010 with an ulcerated chest wall lesion and an elevated CA27.29. Restaging CTs September 18, 2008 showed  a 1.3 x 2.2 cm subdermal lesion at the left anterior chest, multiple enlarged metastases in the left hemithorax at the mediastinum and pleura felt to represent necrotic lymph nodes versus pleural metastases. Right axillary lymph node was decreased in size compared to 2006. There were no suspicious bone lesions. A previously noted lesion in the inferior right liver was not clearly seen. She resumed Femara March 2010. On October 23, 2008 the chest wall lesion had improved. The CA27.29 on October 23, 2008 was stable at 60. She discontinued Femara in June 2010 and CTs of the abdomen and pelvis April 06, 2009 showed slight decrease in the size of a pleural-based nodule in the left lower lobe. There was no other evidence for metastatic disease in the abdomen or pelvis. She resumed Femara on April 11, 2009. CA27.29 returned at 87 on April 11, 2009. CT of the abdomen and pelvis on May 30, 2009 showed an increase in multiple pericardial masses, increased left chest wall subcutaneous nodule, stable pleural-based metastases and stable liver lesions. She discontinued Femara in November 2010 due to nausea and vomiting. Femara was resumed following an office visit June 06, 2009. Dr. Myrle Sheng reviewed a CT from May 30, 2009 with a [name] radiologist comparing the scan to scans from March 2010 and October 2010. In addition, a CT scan from 2006 was reviewed. The pleural-based lesions in the left chest had not changed significantly since March 2010. The pericardial masses appeared slightly larger. Both the pericardial masses and pleural-based masses were larger compared to the CT from 2006. She discontinued Femara January 2011. The left chest wall nodule recurred during the time she was off of Femara. Femara was resumed following an office visit 10/02/2009. The nodule again improved. Restaging CT of the chest 08/16/2010 was stable compared to a CT from 03/29/2010. Most recent CA 27-29 on 01/16/2012 was stable at 340. Femara was continued. CT the chest 02/23/2012 with progressive pleural-based tumor in the left chest with probable involvement of T3. Femara was discontinued. MRI of the thoracic spine on 03/05/2012 showed severe T3 and T4 metastatic disease with mild pathologic compression fractures and epidural extension causing some cord compression at T3. There was no definite cord edema. There was associated extensive bilateral neural  foraminal involvement at the T2 and T3 nerve levels greater on the left. There was associated very bulky mediastinal and pleural metastatic disease. She underwent a laminectomy procedure on 04/01/2012 with resection of epidural metastatic tumor. 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 The tumor was ER positive 100%; PR positive 53%; HER-2/neu by CISH showed amplification.  2. Ulcerated left chest lesion, likely a metastatic lesion. Stable.  3. Severe right low back/buttock pain radiating to the left leg October 2010 with MRI on April 06, 2009 revealing a disk protrusion at L5-S1 displacing the right S1 nerve root. She was evaluated by Dr. Jordan Likes. The pain has resolved.  4. Hospitalization December 1 through May 31, 2009 with nausea, vomiting and abdominal pain - resolved.  5. 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  6. Pain at the right back/ iliac region October 25, 2008.  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  8. Status post left knee replacement.  9. Chronic edema of the left lower leg.  10. "Migraines". She takes Imitrex as needed.  11. Falls and balance problems occurring over the past year. She has been referred to neurology.  12. Right breast  with area of firmness on exam March 2013 -negative right breast mammogram and ultrasound 09/15/2011  13. Pain at upper back. Resolved following the laminectomy procedure. She was having intermittent pain at the mid to upper back at the time of her last visit. The pain is better. 14. Unsteady gait on 04/14/2012-much improved, she continues a home physical therapy program.  Disposition-Ms. Hunley appears stable. She has decided against Xeloda and Herceptin due to concern regarding potential side effects. We again reviewed the potential toxicities associated with both Xeloda and Herceptin. She is adamant that she will not take either. She  would like to resume Femara. We explained the rationale for discontinuation of Femara i.e. disease progression. She would be agreeable to a different type of hormonal therapy. We discussed Faslodex. She will receive the first injection today. She understands that there is a significantly higher response rate associated with Herceptin alone and Herceptin plus chemotherapy versus hormonal therapy. She continues to decline Herceptin and chemotherapy. She will return for the next Faslodex injection in 2 weeks. We will see her in followup in 4 weeks. She will contact the office in the interim with any problems.  Patient seen with Dr. Truett Perna.   Lonna Cobb ANP/GNP-BC

## 2012-07-12 NOTE — Telephone Encounter (Signed)
THIS REFILL REQUEST FOR XELODA WAS GIVEN TO DR.SHERRILL'S NURSE, SUSAN COWARD,RN. 

## 2012-07-12 NOTE — Telephone Encounter (Signed)
RETURNED FAX TO [facility]. PT. REFUSES CHEMO.

## 2012-07-12 NOTE — Telephone Encounter (Signed)
Put pt on injection schedule per nurse

## 2012-07-13 ENCOUNTER — Telehealth: Payer: Self-pay | Admitting: Oncology

## 2012-07-13 ENCOUNTER — Ambulatory Visit: Payer: Medicare HMO

## 2012-07-13 NOTE — Telephone Encounter (Signed)
Called pt and left message regarding injections on January 2014 and MD visit with injection on February 2014

## 2012-07-13 NOTE — Progress Notes (Signed)
Received 1st dose of Faslodex 500mg  IM.

## 2012-07-26 ENCOUNTER — Ambulatory Visit
Admission: RE | Admit: 2012-07-26 | Discharge: 2012-07-26 | Disposition: A | Discharge status: Home / Self Care | Payer: Medicare HMO | Source: Ambulatory Visit | Attending: Radiation Oncology | Admitting: Radiation Oncology

## 2012-07-26 ENCOUNTER — Encounter: Payer: Self-pay | Admitting: Radiation Oncology

## 2012-07-26 ENCOUNTER — Ambulatory Visit (HOSPITAL_BASED_OUTPATIENT_CLINIC_OR_DEPARTMENT_OTHER): Payer: Medicare HMO

## 2012-07-26 ENCOUNTER — Ambulatory Visit: Payer: Medicare HMO

## 2012-07-26 VITALS — BP 164/79 | HR 80 | Temp 97.8°F | Resp 20 | Wt 198.8 lb

## 2012-07-26 VITALS — BP 154/84 | HR 85 | Temp 98.1°F

## 2012-07-26 DIAGNOSIS — C7951 Secondary malignant neoplasm of bone: Secondary | ICD-10-CM

## 2012-07-26 DIAGNOSIS — Z5111 Encounter for antineoplastic chemotherapy: Secondary | ICD-10-CM

## 2012-07-26 DIAGNOSIS — C50919 Malignant neoplasm of unspecified site of unspecified female breast: Secondary | ICD-10-CM

## 2012-07-26 DIAGNOSIS — C7952 Secondary malignant neoplasm of bone marrow: Secondary | ICD-10-CM

## 2012-07-26 DIAGNOSIS — C787 Secondary malignant neoplasm of liver and intrahepatic bile duct: Secondary | ICD-10-CM

## 2012-07-26 MED ORDER — MORPHINE SULFATE 15 MG PO TABS: 15.0000 mg | ORAL_TABLET | ORAL | Status: DC | PRN

## 2012-07-26 MED ORDER — FULVESTRANT 250 MG/5ML IM SOLN
500.0000 mg | INTRAMUSCULAR | Status: DC
  Administered 2012-07-26: 500 mg via INTRAMUSCULAR
  Filled 2012-07-26: qty 10

## 2012-07-26 MED ORDER — PROCHLORPERAZINE MALEATE 10 MG PO TABS: 10.0000 mg | ORAL_TABLET | Freq: Four times a day (QID) | ORAL | Status: DC | PRN

## 2012-07-26 NOTE — Progress Notes (Signed)
  Radiation Oncology         [phone] ________________________________  Name: [name] [name] MRN: [id number]  Date: 07/26/2012  DOB: 1945-08-15  Multidisciplinary Neuro Oncology Clinic Follow-Up Visit Note  CC: Lorretta Harp, MD  Lorretta Harp, MD  Diagnosis:   67 yo woman with spinal involvement from metastatic breast cancer s/p spinal surgery with instrumentation and separation of tumor from the cord followed by stereotactic radiosurgery to the T3 and T4 vertebral bodies to a prescription dose of 18 Gy on 04/21/2012  Interval Since Last Radiation:  3 months  Narrative:  The patient returns today for routine follow-up.  She is experiencing nausea and has no medication for this.                                ALLERGIES:  is allergic to banana.  Meds: Current Outpatient Prescriptions  Medication Sig Dispense Refill  . albuterol (PROVENTIL HFA;VENTOLIN HFA) 108 (90 BASE) MCG/ACT inhaler Inhale 2 puffs into the lungs every 6 (six) hours as needed. Shortness of breath  1 Inhaler  3  . diazepam (VALIUM) 5 MG tablet Take 5-10 mg by mouth every 6 (six) hours as needed.      . gabapentin (NEURONTIN) 300 MG capsule Take 1 capsule (300 mg total) by mouth 3 (three) times daily. Take one the first day, then two the second day, then up to 3 times a day on the third day.  90 capsule  5  . morphine (MSIR) 15 MG tablet Take 1-2 tablets (15-30 mg total) by mouth every 4 (four) hours as needed for pain.  60 tablet  0  . SUMAtriptan (IMITREX) 50 MG tablet Take 50 mg by mouth daily as needed. For migraines       No current facility-administered medications for this encounter.   Facility-Administered Medications Ordered in Other Encounters  Medication Dose Route Frequency Provider Last Rate Last Dose  . fulvestrant (FASLODEX) injection 500 mg  500 mg Intramuscular Q14 Days Rana Snare, NP   500 mg at 07/26/12 1001    Physical Findings: The patient is in no acute distress. Patient is alert and  oriented.  weight is 198 lb 12.8 oz (90.175 kg). Her oral temperature is 97.8 F (36.6 C). Her blood pressure is 164/79 and her pulse is 80. Her respiration is 20 and oxygen saturation is 98%.   She has modest hyperpigmentation on her upper back from radiotherapy, but, no dermatitis. No focal tenderness along spine. Motor strength in extremities intact.  No significant changes.  Lab Findings: Lab Results  Component Value Date   WBC 5.5 07/12/2012   HGB 11.3* 07/12/2012   HCT 35.3 07/12/2012   MCV 88.8 07/12/2012   PLT 235 07/12/2012   Impression:  The patient is clinically stable at this time.  Plan:  Follow-up in 3 months.  _____________________________________  Artist Pais. Kathrynn Running, M.D.

## 2012-07-26 NOTE — Progress Notes (Signed)
patient here follow up [facility] T-3-T4, 04/21/12 Alert,oriented x3, was upstairs for injection b/l hip this morning,Faslodex IM every 2 weks started, patient had refused Xeloda and Herceptin Hips sore from injections today, appetite good, patient requesting refill on her Morphine medication last written by Dr.Sherrill 11:33 AM

## 2012-07-26 NOTE — Progress Notes (Signed)
Patient now states she has nausea a lot, offerred  ginger ale, no medication for this 11:38 AM

## 2012-07-30 ENCOUNTER — Telehealth: Payer: Self-pay | Admitting: *Deleted

## 2012-07-30 NOTE — Telephone Encounter (Signed)
Contacted patient by phone today after one previous telephone contact and one previous clinic contact regarding the [facility] [id number] [employer] registry. Explained to patient that our site had contacted personnel in charge of the study, who determined that she should not be included in the study since her diagnosis of metastatic breast cancer was more than six months ago. Thanked patient for her willingness to consider participation in this research study. Patient was very appreciative of the call and stated that she feels she is doing better on her current therapy.

## 2012-08-08 ENCOUNTER — Other Ambulatory Visit: Payer: Self-pay | Admitting: Oncology

## 2012-08-09 ENCOUNTER — Ambulatory Visit: Payer: Medicare HMO

## 2012-08-09 ENCOUNTER — Ambulatory Visit: Payer: Medicare HMO | Admitting: Oncology

## 2012-08-12 ENCOUNTER — Other Ambulatory Visit: Payer: Self-pay | Admitting: *Deleted

## 2012-08-12 NOTE — Progress Notes (Signed)
FTKA for OV/injection 08/09/12. POF to scheduler.

## 2012-08-13 ENCOUNTER — Telehealth: Payer: Self-pay | Admitting: Oncology

## 2012-08-13 NOTE — Telephone Encounter (Signed)
Called pt and left message appt has been r/s tp 2/18 MD and inj

## 2012-08-17 ENCOUNTER — Other Ambulatory Visit: Payer: Self-pay | Admitting: Oncology

## 2012-08-17 ENCOUNTER — Ambulatory Visit: Payer: Medicare HMO | Admitting: Oncology

## 2012-08-17 ENCOUNTER — Ambulatory Visit: Payer: Medicare HMO

## 2012-08-17 DIAGNOSIS — Z1231 Encounter for screening mammogram for malignant neoplasm of breast: Secondary | ICD-10-CM

## 2012-08-18 ENCOUNTER — Other Ambulatory Visit: Payer: Self-pay | Admitting: *Deleted

## 2012-08-18 NOTE — Progress Notes (Signed)
FTKA for visit and injection 08/17/12. POF to scheduler to reschedule to 1st avaliable with MD or midlevel.

## 2012-08-19 ENCOUNTER — Telehealth: Payer: Self-pay | Admitting: Oncology

## 2012-08-19 NOTE — Telephone Encounter (Signed)
Talked to patient gave her appt for lab and ML on 3/10

## 2012-09-02 ENCOUNTER — Ambulatory Visit: Payer: Medicare HMO | Admitting: Nurse Practitioner

## 2012-09-02 ENCOUNTER — Other Ambulatory Visit: Payer: Medicare HMO | Admitting: Lab

## 2012-09-06 ENCOUNTER — Other Ambulatory Visit (HOSPITAL_BASED_OUTPATIENT_CLINIC_OR_DEPARTMENT_OTHER): Payer: Medicaid Other | Admitting: Lab

## 2012-09-06 ENCOUNTER — Telehealth: Payer: Self-pay | Admitting: Oncology

## 2012-09-06 ENCOUNTER — Ambulatory Visit (HOSPITAL_BASED_OUTPATIENT_CLINIC_OR_DEPARTMENT_OTHER): Payer: Medicaid Other | Admitting: Nurse Practitioner

## 2012-09-06 VITALS — BP 143/83 | HR 77 | Temp 97.3°F | Resp 20 | Ht 68.0 in | Wt 193.1 lb

## 2012-09-06 DIAGNOSIS — C7952 Secondary malignant neoplasm of bone marrow: Secondary | ICD-10-CM

## 2012-09-06 DIAGNOSIS — C50919 Malignant neoplasm of unspecified site of unspecified female breast: Secondary | ICD-10-CM

## 2012-09-06 DIAGNOSIS — Z853 Personal history of malignant neoplasm of breast: Secondary | ICD-10-CM

## 2012-09-06 DIAGNOSIS — C787 Secondary malignant neoplasm of liver and intrahepatic bile duct: Secondary | ICD-10-CM

## 2012-09-06 DIAGNOSIS — C7951 Secondary malignant neoplasm of bone: Secondary | ICD-10-CM

## 2012-09-06 LAB — CBC WITH DIFFERENTIAL/PLATELET
BASO%: 0.7 % (ref 0.0–2.0)
Basophils Absolute: 0 10e3/uL (ref 0.0–0.1)
EOS%: 0.7 % (ref 0.0–7.0)
Eosinophils Absolute: 0 10e3/uL (ref 0.0–0.5)
HCT: 35.8 % (ref 34.8–46.6)
HGB: 11.8 g/dL (ref 11.6–15.9)
LYMPH%: 29 % (ref 14.0–49.7)
MCH: 28.5 pg (ref 25.1–34.0)
MCHC: 33 g/dL (ref 31.5–36.0)
MCV: 86.3 fL (ref 79.5–101.0)
MONO#: 0.5 10e3/uL (ref 0.1–0.9)
MONO%: 11 % (ref 0.0–14.0)
NEUT#: 2.8 10e3/uL (ref 1.5–6.5)
NEUT%: 58.6 % (ref 38.4–76.8)
Platelets: 211 10e3/uL (ref 145–400)
RBC: 4.15 10e6/uL (ref 3.70–5.45)
RDW: 16.3 % — ABNORMAL HIGH (ref 11.2–14.5)
WBC: 4.8 10e3/uL (ref 3.9–10.3)
lymph#: 1.4 10e3/uL (ref 0.9–3.3)

## 2012-09-06 MED ORDER — MORPHINE SULFATE 15 MG PO TABS: 15.0000 mg | ORAL_TABLET | ORAL | Status: DC | PRN

## 2012-09-06 NOTE — Telephone Encounter (Signed)
Gave pt appt for lab inj and MD on April 2014

## 2012-09-06 NOTE — Progress Notes (Signed)
OFFICE PROGRESS NOTE  Interval history:  [name] [name] returns after missing a recent followup visit. She began Faslodex 07/12/2012. She received the second injection on 07/26/2012. She missed the appointment for the third injection.  She reports significant nausea/vomiting lasting for 1-1/2 days following the second Faslodex injection. No hot flashes. Back pain overall is better. She estimates taking morphine twice a day with good pain control.   Objective: Blood pressure 143/83, pulse 77, temperature 97.3 F (36.3 C), temperature source Oral, resp. rate 20, height 5\' 8"  (1.727 m), weight 193 lb 1.6 oz (87.59 kg).  Oropharynx is without thrush or ulceration. Lungs are clear. Breath sounds diminished at the left upper posterior chest. Regular cardiac rhythm. Abdomen is soft and nontender. Extremities are without edema. 2 cm x 1.5 cm raised, crusted, indurated plaque at the left anterior chest wall.  Lab Results: Lab Results  Component Value Date   WBC 4.8 09/06/2012   HGB 11.8 09/06/2012   HCT 35.8 09/06/2012   MCV 86.3 09/06/2012   PLT 211 09/06/2012    Chemistry:    Chemistry      Component Value Date/Time   NA 140 07/12/2012 1437   NA 139 04/01/2012 1222   K 3.9 07/12/2012 1437   K 3.8 04/01/2012 1222   CL 106 07/12/2012 1437   CL 105 03/25/2012 0935   CO2 28 07/12/2012 1437   CO2 30 03/25/2012 0935   BUN 14.0 07/12/2012 1437   BUN 12 03/25/2012 0935   CREATININE 0.9 07/12/2012 1437   CREATININE 0.84 03/25/2012 0935   CREATININE 0.89 10/23/2010 1514      Component Value Date/Time   CALCIUM 9.4 07/12/2012 1437   CALCIUM 9.6 03/25/2012 0935   ALKPHOS 100 07/12/2012 1437   ALKPHOS 86 10/23/2010 1514   AST 13 07/12/2012 1437   AST 11 10/23/2010 1514   ALT 7 07/12/2012 1437   ALT 8 10/23/2010 1514   BILITOT 0.29 07/12/2012 1437   BILITOT 0.3 10/23/2010 1514       Studies/Results: No results found.  Medications: I have reviewed the patient's current  medications.  Assessment/Plan: 1.Metastatic breast cancer - initially diagnosed with breast cancer in 1996 status post left mastectomy and axillary lymph node dissection. She developed a chest wall recurrence in 2001 and completed radiation. In April 2003, CT showed mediastinal, internal mammary, and right axillary adenopathy and two liver lesions. MRI of the thoracic spine June 18, 2002 revealed osseous abnormalities at T3 and T4 suspicious for metastatic disease with extension into the T3 pedicle. There was extra osseous extension compressing and displacing the thoracic cord. She completed radiation from T1 through T6 June 21, 2002 through July 13, 2002. CT scan Nov 11, 2002 showed progression of metastatic disease with precardiac nodes, left pleural and lower lobe disease, subcutaneous nodules at the left anterior chest wall and progressive metastatic disease involving the liver. She began Femara in May/early June 2004 with subsequent resolution of chest wall nodules. CT scans January 14, 2005, of the chest and abdomen revealed findings of decreased pleural-based metastatic disease in the lower left hemithorax and no new or progressive disease in the chest. Small lesion in the right liver measuring 13.0 x 10.0 mm was reported as stable when compared to her previous study. No other liver lesions were seen. Bone scan January 22, 2006 showed degenerative changes. She discontinued Femara February 2009 and was lost a followup. She presented in March 2010 with an ulcerated chest wall lesion and an elevated CA27.29. Restaging  CTs September 18, 2008 showed a 1.3 x 2.2 cm subdermal lesion at the left anterior chest, multiple enlarged metastases in the left hemithorax at the mediastinum and pleura felt to represent necrotic lymph nodes versus pleural metastases. Right axillary lymph node was decreased in size compared to 2006. There were no suspicious bone lesions. A previously noted lesion in the inferior right liver  was not clearly seen. She resumed Femara March 2010. On October 23, 2008 the chest wall lesion had improved. The CA27.29 on October 23, 2008 was stable at 60. She discontinued Femara in June 2010 and CTs of the abdomen and pelvis April 06, 2009 showed slight decrease in the size of a pleural-based nodule in the left lower lobe. There was no other evidence for metastatic disease in the abdomen or pelvis. She resumed Femara on April 11, 2009. CA27.29 returned at 87 on April 11, 2009. CT of the abdomen and pelvis on May 30, 2009 showed an increase in multiple pericardial masses, increased left chest wall subcutaneous nodule, stable pleural-based metastases and stable liver lesions. She discontinued Femara in November 2010 due to nausea and vomiting. Femara was resumed following an office visit June 06, 2009. Dr. Myrle Sheng reviewed a CT from May 30, 2009 with a [name] radiologist comparing the scan to scans from March 2010 and October 2010. In addition, a CT scan from 2006 was reviewed. The pleural-based lesions in the left chest had not changed significantly since March 2010. The pericardial masses appeared slightly larger. Both the pericardial masses and pleural-based masses were larger compared to the CT from 2006. She discontinued Femara January 2011. The left chest wall nodule recurred during the time she was off of Femara. Femara was resumed following an office visit 10/02/2009. The nodule again improved. Restaging CT of the chest 08/16/2010 was stable compared to a CT from 03/29/2010. Most recent CA 27-29 on 01/16/2012 was stable at 340. Femara was continued. CT the chest 02/23/2012 with progressive pleural-based tumor in the left chest with probable involvement of T3. Femara was discontinued. MRI of the thoracic spine on 03/05/2012 showed severe T3 and T4 metastatic disease with mild pathologic compression fractures and epidural extension causing some cord compression at T3. There was no definite  cord edema. There was associated extensive bilateral neural foraminal involvement at the T2 and T3 nerve levels greater on the left. There was associated very bulky mediastinal and pleural metastatic disease. She underwent a laminectomy procedure on 04/01/2012 with resection of epidural metastatic tumor. 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 The tumor was ER positive 100%; PR positive 53%; HER-2/neu by CISH showed amplification. She declined a trial of chemotherapy and Herceptin. She began Faslodex on 07/12/2012. 2. Ulcerated left chest lesion, likely a metastatic lesion. Stable.  3. Severe right low back/buttock pain radiating to the left leg October 2010 with MRI on April 06, 2009 revealing a disk protrusion at L5-S1 displacing the right S1 nerve root. She was evaluated by Dr. Jordan Likes. The pain has resolved.  4. Hospitalization December 1 through May 31, 2009 with nausea, vomiting and abdominal pain - resolved.  5. 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  6. Pain at the right back/ iliac region October 25, 2008.  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  8. Status post left knee replacement.  9. Chronic edema of the left lower leg.  10. "Migraines". She takes Imitrex as needed.  11. Falls and  balance problems occurring over the past year. She has been referred to neurology.  12. Right breast with area of firmness on exam March 2013 -negative right breast mammogram and ultrasound 09/15/2011  13. Pain at upper back. Resolved following the laminectomy procedure. She was having intermittent pain at the mid to upper back at the time of her last visit. The pain is better.  14. Unsteady gait on 04/14/2012-much improved. 15. Nausea/vomiting following Faslodex 07/26/2012.  Disposition-Ms. Winkles is concerned the nausea/vomiting she experienced 07/26/2012 was related to the Faslodex injection.  She would not agree to a Faslodex injection today. She is agreeable to returning 09/15/2012 for an injection. She has Compazine to take as needed. She will return for a followup visit and injection on 10/14/2012. She will contact the office in the interim with any problems.  Plan reviewed with Dr. Truett Perna.    Lonna Cobb ANP/GNP-BC

## 2012-09-07 LAB — CANCER ANTIGEN 27.29: CA 27.29: 435 U/mL — ABNORMAL HIGH (ref 0–39)

## 2012-09-15 ENCOUNTER — Ambulatory Visit (HOSPITAL_BASED_OUTPATIENT_CLINIC_OR_DEPARTMENT_OTHER): Payer: Medicare HMO

## 2012-09-15 VITALS — BP 151/84 | HR 78 | Temp 98.1°F

## 2012-09-15 DIAGNOSIS — C50919 Malignant neoplasm of unspecified site of unspecified female breast: Secondary | ICD-10-CM

## 2012-09-15 DIAGNOSIS — Z5111 Encounter for antineoplastic chemotherapy: Secondary | ICD-10-CM

## 2012-09-15 MED ORDER — FULVESTRANT 250 MG/5ML IM SOLN
500.0000 mg | INTRAMUSCULAR | Status: DC
  Administered 2012-09-15: 500 mg via INTRAMUSCULAR
  Filled 2012-09-15: qty 10

## 2012-09-15 NOTE — Patient Instructions (Addendum)
Fulvestrant injection What is this medicine? FULVESTRANT (ful VES trant) blocks the effects of estrogen. It is used to treat breast cancer in women past the age of menopause. This medicine may be used for other purposes; ask your health care provider or pharmacist if you have questions. What should I tell my health care provider before I take this medicine? They need to know if you have any of these conditions: -bleeding problems -liver disease -low levels of platelets in the blood -an unusual or allergic reaction to fulvestrant, other medicines, foods, dyes, or preservatives -pregnant or trying to get pregnant -breast-feeding How should I use this medicine? This medicine is for injection into a muscle. It is usually given by a health care professional in a hospital or clinic setting. Talk to your pediatrician regarding the use of this medicine in children. Special care may be needed. Overdosage: If you think you have taken too much of this medicine contact a poison control center or emergency room at once. NOTE: This medicine is only for you. Do not share this medicine with others. What if I miss a dose? It is important not to miss your dose. Call your doctor or health care professional if you are unable to keep an appointment. What may interact with this medicine? -medicines that treat or prevent blood clots like warfarin, enoxaparin, and dalteparin This list may not describe all possible interactions. Give your health care provider a list of all the medicines, herbs, non-prescription drugs, or dietary supplements you use. Also tell them if you smoke, drink alcohol, or use illegal drugs. Some items may interact with your medicine. What should I watch for while using this medicine? Your condition will be monitored carefully while you are receiving this medicine. You will need important blood work done while you are taking this medicine. Do not become pregnant while taking this medicine.  Women should inform their doctor if they wish to become pregnant or think they might be pregnant. There is a potential for serious side effects to an unborn child. Talk to your health care professional or pharmacist for more information. What side effects may I notice from receiving this medicine? Side effects that you should report to your doctor or health care professional as soon as possible: -allergic reactions like skin rash, itching or hives, swelling of the face, lips, or tongue -feeling faint or lightheaded, falls -fever or flu-like symptoms -sore throat -vaginal bleeding Side effects that usually do not require medical attention (report to your doctor or health care professional if they continue or are bothersome): -aches, pains -constipation or diarrhea -headache -hot flashes -nausea, vomiting -pain at site where injected -stomach pain This list may not describe all possible side effects. Call your doctor for medical advice about side effects. You may report side effects to FDA at 1-800-FDA-1088. Where should I keep my medicine? This drug is given in a hospital or clinic and will not be stored at home. NOTE: This sheet is a summary. It may not cover all possible information. If you have questions about this medicine, talk to your doctor, pharmacist, or health care provider.  2013, [employer]. (10/25/2007 3:39:24 PM)  

## 2012-09-16 ENCOUNTER — Other Ambulatory Visit: Payer: Self-pay | Admitting: Oncology

## 2012-09-16 ENCOUNTER — Ambulatory Visit
Admission: RE | Admit: 2012-09-16 | Discharge: 2012-09-16 | Disposition: A | Discharge status: Home / Self Care | Payer: 59 | Source: Ambulatory Visit | Attending: Oncology | Admitting: Oncology

## 2012-09-21 ENCOUNTER — Encounter: Payer: Self-pay | Admitting: Internal Medicine

## 2012-09-21 ENCOUNTER — Ambulatory Visit (INDEPENDENT_AMBULATORY_CARE_PROVIDER_SITE_OTHER): Payer: Medicaid Other | Admitting: Internal Medicine

## 2012-09-21 VITALS — BP 125/75 | HR 84 | Temp 97.2°F | Ht 68.0 in | Wt 193.4 lb

## 2012-09-21 DIAGNOSIS — J45909 Unspecified asthma, uncomplicated: Secondary | ICD-10-CM

## 2012-09-21 DIAGNOSIS — C50919 Malignant neoplasm of unspecified site of unspecified female breast: Secondary | ICD-10-CM

## 2012-09-21 DIAGNOSIS — C801 Malignant (primary) neoplasm, unspecified: Secondary | ICD-10-CM

## 2012-09-21 DIAGNOSIS — Z0000 Encounter for general adult medical examination without abnormal findings: Secondary | ICD-10-CM

## 2012-09-21 DIAGNOSIS — I10 Essential (primary) hypertension: Secondary | ICD-10-CM

## 2012-09-21 MED ORDER — ALBUTEROL SULFATE HFA 108 (90 BASE) MCG/ACT IN AERS: 2.0000 | INHALATION_SPRAY | Freq: Four times a day (QID) | RESPIRATORY_TRACT | Status: DC | PRN

## 2012-09-21 MED ORDER — FLUTICASONE-SALMETEROL 250-50 MCG/DOSE IN AEPB: 1.0000 | INHALATION_SPRAY | Freq: Two times a day (BID) | RESPIRATORY_TRACT | Status: DC

## 2012-09-21 NOTE — Progress Notes (Signed)
Patient ID: [name] [name], female   DOB: Jun 16, 1946, 67 y.o.   MRN: [id number]  Subjective:   Patient ID: [name] [name] female   DOB: Apr 30, 1946 67 y.o.   MRN: [id number]  CC:  Follow up visit.     HPI:  [name] is a 67 y.o. lady with past medical history as outlined below, who presents for a followup visit today  1. Metastasized breast cancer: Patient is followed up by her oncologist, Dr. Truett Perna. She is getting Fulvestrant injection. Last dose was 09/15/12. She will have another appointment with the her oncologist on 10/14/12. Generally she is feeling good.   2. Asthma: Her asthma was controlled well before. Recently she feels like her asthma is getting worse. She uses her albuterol inhaler more frequently in the past month (once in the night and once in the morning, almost every day).   3. Back pain: Patient had metastasized breast cancer to her spine. She had lower back surgery on 10/13 by Dr. Forde Radon. After that her back pain is better. It is controlled by morphine tablet. She still has unbalance in her legs which remains the same and not worse.  Denies fever, chills, fatigue, headaches, cough, chest pain, abdominal pain,diarrhea, constipation, dysuria, urgency, frequency, hematuria.   Past Medical History  Diagnosis Date  . Cancer 1996    breast  . Hyperlipidemia   . Hypertension   . Asthma     best PEF=400  . 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 Chronic low back pain     MRI 2010 showed right sided disease with paracentral  disc protusion at L5-S1 which contacts and displaces the right S1 nerve root within the lateral recess  . Migraine headache     MRI 11/18/10: Mild progression of prominent white matter type changes which may be related to a small vessel disease and / or migraine headaches.Other causes for white matter type changes such as that secondaryto; vasculitis,  inflammatory process or demyelinating process feltto be secondary considerations.  . Leg swelling   . Neuralgia and neuritis   . Tenosynovitis     left, MRI 05/2003  . Herniated disc     h/o ruptured disk, laminectomy L4-L5, Dr. Montez Morita  . Breast cancer 1996, 2001    f/b Dr. Myrle Sheng. s/p masectomy +radation. w/ metastatic disease, now on Femara.  . Tobacco abuse   . Hx of radiation therapy 06/21/02 -07/13/02    T1-T6  . Bone metastases     breast primary, T spine  . Shortness of breath     exertion  . Hx of migraines   . Hx of radiation therapy 04/21/12    T3-T4 spinal SRS  . Breast cancer metastasized to multiple sites 07/12/2012   Current Outpatient Prescriptions  Medication Sig Dispense Refill  . albuterol (PROVENTIL HFA;VENTOLIN HFA) 108 (90 BASE) MCG/ACT inhaler Inhale 2 puffs into the lungs every 6 (six) hours as needed. Shortness of breath  18 g  5  . diazepam (VALIUM) 5 MG tablet Take 5-10 mg by mouth every 6 (six) hours as needed.      . Fluticasone-Salmeterol (ADVAIR DISKUS) 250-50 MCG/DOSE AEPB Inhale 1 puff into the lungs 2 (two) times daily.  60 each  5  . gabapentin (NEURONTIN) 300 MG capsule Take 1 capsule (300 mg total) by mouth 3 (three) times daily. Take one  the first day, then two the second day, then up to 3 times a day on the third day.  90 capsule  5  . morphine (MSIR) 15 MG tablet Take 1-2 tablets (15-30 mg total) by mouth every 4 (four) hours as needed for pain.  60 tablet  0  . prochlorperazine (COMPAZINE) 10 MG tablet Take 1 tablet (10 mg total) by mouth every 6 (six) hours as needed.  30 tablet  5  . SUMAtriptan (IMITREX) 50 MG tablet Take 50 mg by mouth daily as needed. For migraines       No current facility-administered medications for this visit.   Family History  Problem Relation Age of Onset  . Anesthesia problems Neg Hx   . Hypotension Neg Hx   . Malignant hyperthermia Neg Hx   . Pseudochol deficiency Neg Hx    History   Social History  .  Marital Status: Divorced    Spouse Name: N/A    Number of Children: N/A  . Years of Education: N/A   Social History Main Topics  . Smoking status: Current Every Day Smoker -- 0.50 packs/day for 50 years    Types: Cigarettes  . Smokeless tobacco: Never Used     Comment: smoking a little less  . Alcohol Use: No  . Drug Use: Yes    Special: Oxycodone  . Sexually Active: Not Currently   Other Topics Concern  . None   Social History Narrative  . None    Review of Systems: As per HPI  Objective:  Physical Exam: Filed Vitals:   09/21/12 1342 09/21/12 1346  BP: 157/88 125/75  Pulse: 84   Temp: 97.2 F (36.2 C)   TempSrc: Oral   Height: 5\' 8"  (1.727 m)   Weight: 193 lb 6.4 oz (87.726 kg)   SpO2: 97%    Constitutional: Vital signs reviewed.  Patient is a well-developed and well-nourished, in no acute distress and cooperative with exam.   HEENT:  Head: Normocephalic and atraumatic Ear: TM normal bilaterally Mouth: no erythema or exudates, MMM Eyes: PERRL, EOMI, conjunctivae normal, No scleral icterus.  Neck: Supple, Trachea midline normal ROM, No JVD, mass, thyromegaly, or carotid bruit present. No lymph node enlargement. Cardiovascular: RRR, S1 normal, S2 normal, no MRG, pulses symmetric and intact bilaterally Pulmonary/Chest: CTAB, no wheezes, rales, or rhonchi Abdominal: Soft. Non-tender, non-distended, bowel sounds are normal, no masses, organomegaly, or guarding present.  GU: no CVA tenderness Musculoskeletal:  has tenderness over lower back in the midline.  Neurological: A&O x3, Strength is normal and symmetric bilaterally, cranial nerve II-XII are grossly intact, no focal motor deficit, sensory intact to light touch bilaterally. Brachial reflex 2+ bilaterally. Knee reflex 2+ bilaterally. Has unsteady gait.  Skin: Warm, dry and intact. No rash, cyanosis, or clubbing.  Psychiatric: Normal mood and affect. speech and behavior is normal. Judgment and thought content normal.  Cognition and memory are normal.   Assessment & Plan:

## 2012-09-21 NOTE — Assessment & Plan Note (Signed)
Patient is followed up by her oncologist, Dr. Truett Perna. She is getting Fulvestrant injection. Last dose was 09/15/12. She will have another appointment with the her oncologist on 10/14/12.  Will follow up.

## 2012-09-21 NOTE — Assessment & Plan Note (Signed)
Her asthma was controlled well before. Recently she feels like her asthma is getting worse. She uses her albuterol inhaler more frequently in the past month (once in the night and once in the morning, almost every day). Her auscultation is clear bilaterally, indicating no acute exacerbation.  -will continue albuterol when necessary -Will add Advair. I explained to patient that Advair should be used persistently to prevent her asthma exacerbation.

## 2012-09-21 NOTE — Assessment & Plan Note (Signed)
BP Readings from Last 3 Encounters:  09/21/12 125/75  09/15/12 151/84  09/06/12 143/83    Lab Results  Component Value Date   NA 140 07/12/2012   K 3.9 07/12/2012   CREATININE 0.9 07/12/2012    Assessment:  Blood pressure control: controlled  Progress toward BP goal:  at goal  Comments:  Plan:  Medications:  continue current medications  Educational resources provided: brochure  Self management tools provided:    Other plans: Blood pressure is well controlled. We'll continue current regimen.

## 2012-09-21 NOTE — Assessment & Plan Note (Signed)
-  patient refused flu shot and pneumococcal vaccination, will postpone -Patient did not need colonoscopy giving her lifespin.

## 2012-09-21 NOTE — Patient Instructions (Addendum)
1. Please start using Advair inhaler for your asthma. This medication can prevent asthma attach. You should used it consistently and chronically. 2. Please take all medications as prescribed.  3. If you have worsening of your symptoms or new symptoms arise, please call the clinic [phone]), or go to the ER immediately if symptoms are severe.  You have done great job in taking all your medications. I appreciate it very much. Please continue doing that.  Asthma, Adult Asthma is caused by narrowing of the air passages in the lungs. It may be triggered by pollen, dust, animal dander, molds, some foods, respiratory infections, exposure to smoke, exercise, emotional stress or other allergens (things that cause allergic reactions or allergies). Repeat attacks are common. HOME CARE INSTRUCTIONS   Use prescription medications as ordered by your caregiver.  Avoid pollen, dust, animal dander, molds, smoke and other things that cause attacks at home and at work.  You may have fewer attacks if you decrease dust in your home. Electrostatic air cleaners may help.  It may help to replace your pillows or mattress with materials less likely to cause allergies.  Talk to your caregiver about an action plan for managing asthma attacks at home, including, the use of a peak flow meter which measures the severity of your asthma attack. An action plan can help minimize or stop the attack without having to seek medical care.  If you are not on a fluid restriction, drink 8 to 10 glasses of water each day.  Always have a plan prepared for seeking medical attention, including, calling your physician, accessing local emergency care, and calling 911 (in the [place] for a severe attack.  Discuss possible exercise routines with your caregiver.  If animal dander is the cause of asthma, you may need to get rid of pets. SEEK MEDICAL CARE IF:   You have wheezing and shortness of breath even if taking medicine to prevent  attacks.  You have muscle aches, chest pain or thickening of sputum.  Your sputum changes from clear or white to yellow, green, gray, or bloody.  You have any problems that may be related to the medicine you are taking (such as a rash, itching, swelling or trouble breathing). SEEK IMMEDIATE MEDICAL CARE IF:   Your usual medicines do not stop your wheezing or there is increased coughing and/or shortness of breath.  You have increased difficulty breathing.  You have a fever. MAKE SURE YOU:   Understand these instructions.  Will watch your condition.  Will get help right away if you are not doing well or get worse. Document Released: 06/16/2005 Document Revised: 09/08/2011 Document Reviewed: 02/02/2008 [facility] Patient Information 2013 [place], [place].

## 2012-09-24 ENCOUNTER — Telehealth: Payer: Self-pay | Admitting: *Deleted

## 2012-09-24 NOTE — Telephone Encounter (Signed)
Call from pt requesting new Rx for breast prosthesis. Has lost weight and needs replacement. Rx faxed to [facility].

## 2012-10-14 ENCOUNTER — Other Ambulatory Visit (HOSPITAL_BASED_OUTPATIENT_CLINIC_OR_DEPARTMENT_OTHER): Payer: Medicaid Other | Admitting: Lab

## 2012-10-14 ENCOUNTER — Telehealth: Payer: Self-pay | Admitting: Oncology

## 2012-10-14 ENCOUNTER — Ambulatory Visit (HOSPITAL_BASED_OUTPATIENT_CLINIC_OR_DEPARTMENT_OTHER): Payer: Medicaid Other | Admitting: Oncology

## 2012-10-14 ENCOUNTER — Ambulatory Visit (HOSPITAL_BASED_OUTPATIENT_CLINIC_OR_DEPARTMENT_OTHER): Payer: Medicaid Other

## 2012-10-14 ENCOUNTER — Other Ambulatory Visit: Payer: Self-pay | Admitting: *Deleted

## 2012-10-14 VITALS — BP 160/84 | HR 71 | Temp 97.1°F | Resp 20 | Ht 68.0 in | Wt 189.4 lb

## 2012-10-14 DIAGNOSIS — C787 Secondary malignant neoplasm of liver and intrahepatic bile duct: Secondary | ICD-10-CM

## 2012-10-14 DIAGNOSIS — C50919 Malignant neoplasm of unspecified site of unspecified female breast: Secondary | ICD-10-CM

## 2012-10-14 DIAGNOSIS — C44599 Other specified malignant neoplasm of skin of other part of trunk: Secondary | ICD-10-CM

## 2012-10-14 DIAGNOSIS — Z5111 Encounter for antineoplastic chemotherapy: Secondary | ICD-10-CM

## 2012-10-14 DIAGNOSIS — C7952 Secondary malignant neoplasm of bone marrow: Secondary | ICD-10-CM

## 2012-10-14 DIAGNOSIS — C7951 Secondary malignant neoplasm of bone: Secondary | ICD-10-CM

## 2012-10-14 DIAGNOSIS — R634 Abnormal weight loss: Secondary | ICD-10-CM

## 2012-10-14 LAB — COMPREHENSIVE METABOLIC PANEL (CC13)
AST: 11 U/L (ref 5–34)
Alkaline Phosphatase: 92 U/L (ref 40–150)
BUN: 13.5 mg/dL (ref 7.0–26.0)
Creatinine: 0.8 mg/dL (ref 0.6–1.1)
Potassium: 3.8 mEq/L (ref 3.5–5.1)
Total Bilirubin: 0.37 mg/dL (ref 0.20–1.20)

## 2012-10-14 LAB — CBC WITH DIFFERENTIAL/PLATELET
BASO%: 0.6 % (ref 0.0–2.0)
EOS%: 0.9 % (ref 0.0–7.0)
HCT: 37.5 % (ref 34.8–46.6)
MCH: 28.8 pg (ref 25.1–34.0)
MCHC: 32.9 g/dL (ref 31.5–36.0)
NEUT%: 60.4 % (ref 38.4–76.8)
RBC: 4.29 10*6/uL (ref 3.70–5.45)
RDW: 16.3 % — ABNORMAL HIGH (ref 11.2–14.5)
WBC: 5.4 10*3/uL (ref 3.9–10.3)
lymph#: 1.4 10*3/uL (ref 0.9–3.3)

## 2012-10-14 MED ORDER — DIAZEPAM 5 MG PO TABS: 5.0000 mg | ORAL_TABLET | Freq: Four times a day (QID) | ORAL | Status: DC | PRN

## 2012-10-14 MED ORDER — MORPHINE SULFATE 15 MG PO TABS: 15.0000 mg | ORAL_TABLET | ORAL | Status: DC | PRN

## 2012-10-14 MED ORDER — FULVESTRANT 250 MG/5ML IM SOLN
500.0000 mg | INTRAMUSCULAR | Status: DC
  Administered 2012-10-14: 500 mg via INTRAMUSCULAR
  Filled 2012-10-14: qty 10

## 2012-10-14 NOTE — Progress Notes (Signed)
[facility]    OFFICE PROGRESS NOTE   INTERVAL HISTORY:   She returns as scheduled. She has been maintained on Faslodex since January. She reports no problem with the injection. She continues to have pain at the upper back. No new sites of pain. The lesion at the left mastectomy site bleeds when it rubs against her bra. No new nodules. She is losing weight.  Objective:  Vital signs in last 24 hours:  Blood pressure 160/84, pulse 71, temperature 97.1 F (36.2 C), temperature source Oral, resp. rate 20, height 5\' 8"  (1.727 m), weight 189 lb 6.4 oz (85.911 kg), SpO2 99.00%.    HEENT: Neck without mass Lymphatics: No cervical, supraclavicular, or axillary nodes Resp: Lungs clear bilaterally Cardio: Regular rate and rhythm GI: No hepatomegaly, nontender Vascular: The left lower leg is slightly larger than the right side, no erythema or edema Musculoskeletal: Tenderness at the left upper back without a palpable mass  Skin: 2 cm nodular cutaneous lesion at the left mastectomy scar with a central ulceration, no other chest wall nodules    Lab Results:  Lab Results  Component Value Date   WBC 5.4 10/14/2012   HGB 12.4 10/14/2012   HCT 37.5 10/14/2012   MCV 87.5 10/14/2012   PLT 217 10/14/2012      Medications: I have reviewed the patient's current medications.  Assessment/Plan: 1.Metastatic breast cancer - initially diagnosed with breast cancer in 1996 status post left mastectomy and axillary lymph node dissection. She developed a chest wall recurrence in 2001 and completed radiation. In April 2003, CT showed mediastinal, internal mammary, and right axillary adenopathy and two liver lesions. MRI of the thoracic spine June 18, 2002 revealed osseous abnormalities at T3 and T4 suspicious for metastatic disease with extension into the T3 pedicle. There was extra osseous extension compressing and displacing the thoracic cord. She completed radiation from T1 through T6  June 21, 2002 through July 13, 2002. CT scan Nov 11, 2002 showed progression of metastatic disease with precardiac nodes, left pleural and lower lobe disease, subcutaneous nodules at the left anterior chest wall and progressive metastatic disease involving the liver. She began Femara in May/early June 2004 with subsequent resolution of chest wall nodules. CT scans January 14, 2005, of the chest and abdomen revealed findings of decreased pleural-based metastatic disease in the lower left hemithorax and no new or progressive disease in the chest. Small lesion in the right liver measuring 13.0 x 10.0 mm was reported as stable when compared to her previous study. No other liver lesions were seen. Bone scan January 22, 2006 showed degenerative changes. She discontinued Femara February 2009 and was lost a followup. She presented in March 2010 with an ulcerated chest wall lesion and an elevated CA27.29. Restaging CTs September 18, 2008 showed a 1.3 x 2.2 cm subdermal lesion at the left anterior chest, multiple enlarged metastases in the left hemithorax at the mediastinum and pleura felt to represent necrotic lymph nodes versus pleural metastases. Right axillary lymph node was decreased in size compared to 2006. There were no suspicious bone lesions. A previously noted lesion in the inferior right liver was not clearly seen. She resumed Femara March 2010. On October 23, 2008 the chest wall lesion had improved. The CA27.29 on October 23, 2008 was stable at 60. She discontinued Femara in June 2010 and CTs of the abdomen and pelvis April 06, 2009 showed slight decrease in the size of a pleural-based nodule in the left lower lobe.  There was no other evidence for metastatic disease in the abdomen or pelvis. She resumed Femara on April 11, 2009. CA27.29 returned at 87 on April 11, 2009. CT of the abdomen and pelvis on May 30, 2009 showed an increase in multiple pericardial masses, increased left chest wall subcutaneous nodule,  stable pleural-based metastases and stable liver lesions. She discontinued Femara in November 2010 due to nausea and vomiting. Femara was resumed following an office visit June 06, 2009. Dr. Myrle Sheng reviewed a CT from May 30, 2009 with a [name] radiologist comparing the scan to scans from March 2010 and October 2010. In addition, a CT scan from 2006 was reviewed. The pleural-based lesions in the left chest had not changed significantly since March 2010. The pericardial masses appeared slightly larger. Both the pericardial masses and pleural-based masses were larger compared to the CT from 2006. She discontinued Femara January 2011. The left chest wall nodule recurred during the time she was off of Femara. Femara was resumed following an office visit 10/02/2009. The nodule again improved. Restaging CT of the chest 08/16/2010 was stable compared to a CT from 03/29/2010. Most recent CA 27-29 on 01/16/2012 was stable at 340. Femara was continued. CT the chest 02/23/2012 with progressive pleural-based tumor in the left chest with probable involvement of T3. Femara was discontinued. MRI of the thoracic spine on 03/05/2012 showed severe T3 and T4 metastatic disease with mild pathologic compression fractures and epidural extension causing some cord compression at T3. There was no definite cord edema. There was associated extensive bilateral neural foraminal involvement at the T2 and T3 nerve levels greater on the left. There was associated very bulky mediastinal and pleural metastatic disease. She underwent a laminectomy procedure on 04/01/2012 with resection of epidural metastatic tumor. 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 The tumor was ER positive 100%; PR positive 53%; HER-2/neu by CISH showed amplification. She declined a trial of chemotherapy and Herceptin. She began Faslodex on 07/12/2012.  2. Ulcerated left chest lesion, likely a metastatic lesion. Larger  today. 3. Severe right low back/buttock pain radiating to the left leg October 2010 with MRI on April 06, 2009 revealing a disk protrusion at L5-S1 displacing the right S1 nerve root. She was evaluated by Dr. Jordan Likes. The pain has resolved.  4. Hospitalization December 1 through May 31, 2009 with nausea, vomiting and abdominal pain - resolved.  5. 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  6. Pain at the right back/ iliac region October 25, 2008.  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  8. Status post left knee replacement.  9. Chronic edema of the left lower leg.  10. "Migraines". She takes Imitrex as needed.  11. Falls and balance problems occurring over the past year. She has been referred to neurology. No difficulty with ambulation today. 12. Right breast with area of firmness on exam March 2013 -negative right breast mammogram and ultrasound 09/15/2011 . Negative right breast mammogram 09/16/2012. 13. Pain at upper back. Resolved following the laminectomy procedure. She continues to have intermittent pain at the upper back 14. Unsteady gait on 04/14/2012-much improved.  15. Nausea/vomiting following Faslodex 07/26/2012.  16. Weight loss-likely secondary to progression of the metastatic breast cancer   Disposition:  She has been maintained on Faslodex for the past 3 months. The cutaneous lesion at the left mastectomy site appears slightly larger on exam today and continues to bleed intermittently. We will followup  on the CA 27-29 from today.  I again discussed systemic treatment options with Ms. Labarre. I described the high expected clinical response rate with Herceptin based therapy. She declined Herceptin and chemotherapy.  She will continue MSIR for pain. Ms. Cuthrell will receive Faslodex today. She will return for an office visit and Faslodex in one month. We will discontinue the Faslodex if it becomes clear  that she is not responding. We will continue to counsel her regarding systemic treatment options.   Thornton Papas, MD  10/14/2012  12:10 PM

## 2012-10-15 LAB — CANCER ANTIGEN 27.29: CA 27.29: 644 U/mL — ABNORMAL HIGH (ref 0–39)

## 2012-10-19 ENCOUNTER — Telehealth: Payer: Self-pay | Admitting: Dietician

## 2012-10-19 NOTE — Telephone Encounter (Signed)
Brief Outpatient Oncology Nutrition Note  Patient has been identified to be at risk on malnutrition screen.   Wt Readings from Last 10 Encounters:  10/14/12 189 lb 6.4 oz (85.911 kg)  09/21/12 193 lb 6.4 oz (87.726 kg)  09/06/12 193 lb 1.6 oz (87.59 kg)  07/26/12 198 lb 12.8 oz (90.175 kg)  07/12/12 204 lb (92.534 kg)  06/08/12 202 lb 8 oz (91.853 kg)  05/31/12 204 lb (92.534 kg)  05/25/12 205 lb 11.2 oz (93.305 kg)  05/05/12 209 lb 1.6 oz (94.847 kg)  04/30/12 205 lb 9.6 oz (93.26 kg)    Called and spoke with patient secondary to continued weight loss.  Patient reports that some days she eats very well and other days has not appetite and is unable to eat.  Discussed supplement use for low days.  Patient with no questions at this time.  Encouraged patient to call for nutrition needs.  Oran Rein, RD, LDN

## 2012-10-21 ENCOUNTER — Other Ambulatory Visit: Payer: Self-pay | Admitting: *Deleted

## 2012-10-21 NOTE — Telephone Encounter (Signed)
Rec'd fax from [facility] for Valium-quantity limits, patient attempting refill. Attempted to call patient, no answer,  normally refill request come via pharmacy. Called pharmacist at [facility], [phone], he states patient picked up Rx on 10/14/12, no additional refills being requested at this time.

## 2012-11-01 ENCOUNTER — Ambulatory Visit: Payer: Medicare HMO | Admitting: Radiation Oncology

## 2012-11-04 ENCOUNTER — Ambulatory Visit
Admission: RE | Admit: 2012-11-04 | Discharge: 2012-11-04 | Disposition: A | Discharge status: Home / Self Care | Payer: 59 | Source: Ambulatory Visit | Attending: Radiation Oncology | Admitting: Radiation Oncology

## 2012-11-04 VITALS — BP 141/74 | HR 87 | Temp 98.6°F | Ht 68.0 in | Wt 185.0 lb

## 2012-11-04 DIAGNOSIS — C50919 Malignant neoplasm of unspecified site of unspecified female breast: Secondary | ICD-10-CM

## 2012-11-04 DIAGNOSIS — C7951 Secondary malignant neoplasm of bone: Secondary | ICD-10-CM

## 2012-11-04 DIAGNOSIS — C7949 Secondary malignant neoplasm of other parts of nervous system: Secondary | ICD-10-CM

## 2012-11-04 DIAGNOSIS — C7931 Secondary malignant neoplasm of brain: Secondary | ICD-10-CM

## 2012-11-04 MED ORDER — MORPHINE SULFATE 15 MG PO TABS: 15.0000 mg | ORAL_TABLET | ORAL | Status: DC | PRN

## 2012-11-04 NOTE — Progress Notes (Signed)
[name] [name] is here for follow up after stereotactic radiosurgery to the T3 T4 vertebral bodies.  She does have pain in her upper back in the area of surgery.  Today she is rating the pain at a 6/10.  She reports that some days the pain is very severe.  She is currently taking morphine 15 mg q 4 hours that she states that it really helps the pain.  She does need a refill for the morphine and is also out of the gabapentin 300 mg and is wondering if she needs to have it refilled also.  She denies fatigue and nausea and states that her appetite comes and goes.

## 2012-11-07 ENCOUNTER — Encounter: Payer: Self-pay | Admitting: Radiation Oncology

## 2012-11-07 NOTE — Progress Notes (Signed)
Radiation Oncology         [phone] ________________________________  Name: [name] [name] MRN: [id number]  Date: 11/04/2012  DOB: [birth year]  Follow-Up Visit Note  CC: Lorretta Harp, MD  Lorretta Harp, MD  Diagnosis:   67 yo woman with spinal involvement from metastatic breast cancer s/p spinal surgery with instrumentation and separation of tumor from the cord followed by stereotactic radiosurgery to the T3 and T4 vertebral bodies to a prescription dose of 18 Gy on 04/21/2012  Interval Since Last Radiation:  7  months  Narrative:  The patient returns today for follow up after stereotactic radiosurgery to the T3 T4 vertebral bodies. She does have pain in her upper back in the area of surgery. Today she is rating the pain at a 6/10. She reports that some days the pain is very severe. She is currently taking morphine 15 mg q 4 hours that she states that it really helps the pain. She does need a refill for the morphine and is also out of the gabapentin 300 mg and is wondering if she needs to have it refilled also. She denies fatigue and nausea and states that her appetite comes and goes                              ALLERGIES:  is allergic to banana.  Meds: Current Outpatient Prescriptions  Medication Sig Dispense Refill  . albuterol (PROVENTIL HFA;VENTOLIN HFA) 108 (90 BASE) MCG/ACT inhaler Inhale 2 puffs into the lungs every 6 (six) hours as needed. Shortness of breath  18 g  5  . diazepam (VALIUM) 5 MG tablet Take 1-2 tablets (5-10 mg total) by mouth every 6 (six) hours as needed.  30 tablet  0  . Fluticasone-Salmeterol (ADVAIR DISKUS) 250-50 MCG/DOSE AEPB Inhale 1 puff into the lungs 2 (two) times daily.  60 each  5  . gabapentin (NEURONTIN) 300 MG capsule Take 1 capsule (300 mg total) by mouth 3 (three) times daily. Take one the first day, then two the second day, then up to 3 times a day on the third day.  90 capsule  5  . morphine (MSIR) 15 MG tablet Take 1-2 tablets (15-30 mg total) by  mouth every 4 (four) hours as needed for pain.  100 tablet  0  . prochlorperazine (COMPAZINE) 10 MG tablet Take 1 tablet (10 mg total) by mouth every 6 (six) hours as needed.  30 tablet  5  . SUMAtriptan (IMITREX) 50 MG tablet Take 50 mg by mouth daily as needed. For migraines       No current facility-administered medications for this encounter.    Physical Findings: The patient is in no acute distress. Patient is alert and oriented.  height is 5\' 8"  (1.727 m) and weight is 185 lb (83.915 kg). Her temperature is 98.6 F (37 C). Her blood pressure is 141/74 and her pulse is 87. [name] She has hyperpigmentation and dense subcutaneous fibrosis along the upper thoracic spine. Motor strength is 5/5 in lower extremities no deficits to light touch.  No significant changes.  Lab Findings: Lab Results  Component Value Date   WBC 5.4 10/14/2012   HGB 12.4 10/14/2012   HCT 37.5 10/14/2012   MCV 87.5 10/14/2012   PLT 217 10/14/2012   Impression:  The patient does continue to have some pain in the upper thoracic spine following stereotactic radiosurgery. Spinal cord function remains intact  Plan:  Today,  refilled the patient's morphine prescription. She will continue to follow with Dr. Truett Perna. If additional radiation treatment is warranted, be more than happy involved in her care in the future. I did not set her up for routine followups after this is a was clinically indicated.  _____________________________________  Artist Pais. Kathrynn Running, M.D.

## 2012-11-11 ENCOUNTER — Telehealth: Payer: Self-pay | Admitting: *Deleted

## 2012-11-11 ENCOUNTER — Ambulatory Visit: Payer: Medicaid Other | Admitting: Nurse Practitioner

## 2012-11-11 ENCOUNTER — Ambulatory Visit: Payer: Medicaid Other

## 2012-11-11 NOTE — Telephone Encounter (Signed)
Called patient about missed appointment.  States that she forgot.  Reschedule to 11/18/12.  Patient aware.

## 2012-11-18 ENCOUNTER — Ambulatory Visit (HOSPITAL_BASED_OUTPATIENT_CLINIC_OR_DEPARTMENT_OTHER): Payer: Medicaid Other

## 2012-11-18 VITALS — BP 158/76 | HR 82 | Temp 98.6°F

## 2012-11-18 DIAGNOSIS — C7951 Secondary malignant neoplasm of bone: Secondary | ICD-10-CM

## 2012-11-18 DIAGNOSIS — Z5111 Encounter for antineoplastic chemotherapy: Secondary | ICD-10-CM

## 2012-11-18 DIAGNOSIS — C50919 Malignant neoplasm of unspecified site of unspecified female breast: Secondary | ICD-10-CM

## 2012-11-18 DIAGNOSIS — C787 Secondary malignant neoplasm of liver and intrahepatic bile duct: Secondary | ICD-10-CM

## 2012-11-18 MED ORDER — FULVESTRANT 250 MG/5ML IM SOLN
500.0000 mg | INTRAMUSCULAR | Status: DC
  Administered 2012-11-18: 500 mg via INTRAMUSCULAR
  Filled 2012-11-18: qty 10

## 2012-12-04 ENCOUNTER — Emergency Department (HOSPITAL_COMMUNITY)
Admission: EM | Admit: 2012-12-04 | Discharge: 2012-12-04 | Disposition: A | Discharge status: Home / Self Care | Payer: Medicare HMO | Attending: Emergency Medicine | Admitting: Emergency Medicine

## 2012-12-04 ENCOUNTER — Encounter (HOSPITAL_COMMUNITY): Payer: Self-pay | Admitting: *Deleted

## 2012-12-04 DIAGNOSIS — M549 Dorsalgia, unspecified: Secondary | ICD-10-CM

## 2012-12-04 DIAGNOSIS — Z8739 Personal history of other diseases of the musculoskeletal system and connective tissue: Secondary | ICD-10-CM | POA: Insufficient documentation

## 2012-12-04 DIAGNOSIS — J45909 Unspecified asthma, uncomplicated: Secondary | ICD-10-CM | POA: Insufficient documentation

## 2012-12-04 DIAGNOSIS — Z8639 Personal history of other endocrine, nutritional and metabolic disease: Secondary | ICD-10-CM | POA: Insufficient documentation

## 2012-12-04 DIAGNOSIS — Z862 Personal history of diseases of the blood and blood-forming organs and certain disorders involving the immune mechanism: Secondary | ICD-10-CM | POA: Insufficient documentation

## 2012-12-04 DIAGNOSIS — Z79899 Other long term (current) drug therapy: Secondary | ICD-10-CM | POA: Insufficient documentation

## 2012-12-04 DIAGNOSIS — Z853 Personal history of malignant neoplasm of breast: Secondary | ICD-10-CM | POA: Insufficient documentation

## 2012-12-04 DIAGNOSIS — Y939 Activity, unspecified: Secondary | ICD-10-CM | POA: Insufficient documentation

## 2012-12-04 DIAGNOSIS — IMO0002 Reserved for concepts with insufficient information to code with codable children: Secondary | ICD-10-CM | POA: Insufficient documentation

## 2012-12-04 DIAGNOSIS — W010XXA Fall on same level from slipping, tripping and stumbling without subsequent striking against object, initial encounter: Secondary | ICD-10-CM | POA: Insufficient documentation

## 2012-12-04 DIAGNOSIS — F17200 Nicotine dependence, unspecified, uncomplicated: Secondary | ICD-10-CM | POA: Insufficient documentation

## 2012-12-04 DIAGNOSIS — Y929 Unspecified place or not applicable: Secondary | ICD-10-CM | POA: Insufficient documentation

## 2012-12-04 DIAGNOSIS — Z8669 Personal history of other diseases of the nervous system and sense organs: Secondary | ICD-10-CM | POA: Insufficient documentation

## 2012-12-04 DIAGNOSIS — Z923 Personal history of irradiation: Secondary | ICD-10-CM | POA: Insufficient documentation

## 2012-12-04 DIAGNOSIS — I10 Essential (primary) hypertension: Secondary | ICD-10-CM | POA: Insufficient documentation

## 2012-12-04 DIAGNOSIS — Z85830 Personal history of malignant neoplasm of bone: Secondary | ICD-10-CM | POA: Insufficient documentation

## 2012-12-04 DIAGNOSIS — G43909 Migraine, unspecified, not intractable, without status migrainosus: Secondary | ICD-10-CM | POA: Insufficient documentation

## 2012-12-04 MED ORDER — HYDROMORPHONE HCL PF 2 MG/ML IJ SOLN
2.0000 mg | Freq: Once | INTRAMUSCULAR | Status: AC
  Administered 2012-12-04: 2 mg via INTRAVENOUS
  Filled 2012-12-04: qty 1

## 2012-12-04 MED ORDER — ONDANSETRON HCL 4 MG/2ML IJ SOLN
4.0000 mg | Freq: Once | INTRAMUSCULAR | Status: AC
  Administered 2012-12-04: 4 mg via INTRAVENOUS
  Filled 2012-12-04: qty 2

## 2012-12-04 NOTE — ED Notes (Signed)
EKG given to Dr. Miller. Copy placed in pt chart. 

## 2012-12-04 NOTE — ED Notes (Signed)
Pt states lower back pain since this morning. Hx of back surgery.

## 2012-12-04 NOTE — ED Provider Notes (Signed)
History     CSN: [id number]  Arrival date & time 12/04/12  1939   First MD Initiated Contact with Patient 12/04/12 2024      Chief Complaint  Patient presents with  . Back Pain    (Consider location/radiation/quality/duration/timing/severity/associated sxs/prior treatment) HPI Comments: 67 year old female with a history of metastatic breast cancer who has had recent upper thoracic fusion surgery, stereotactic surgery after removal of port compressing tumor of her upper thoracic spine. She has had intermittent upper back pain since that time but ever since one week ago when she stumbled and fell into the wall she has had increased pain in that area. The pain seems to be positional, worse with palpation, not associated with numbness, weakness, urinary incontinence or retention. She has been using her home pain medications with minimal improvement, symptoms seem to be getting worse. The patient is tearful  Patient is a 67 y.o. female presenting with back pain. The history is provided by the patient and medical records.  Back Pain   Past Medical History  Diagnosis Date  . Cancer 1996    breast  . Hyperlipidemia   . Hypertension   . Asthma     best PEF=400  . 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 Chronic low back pain     MRI 2010 showed right sided disease with paracentral  disc protusion at L5-S1 which contacts and displaces the right S1 nerve root within the lateral recess  . Migraine headache     MRI 11/18/10: Mild progression of prominent white matter type changes which may be related to a small vessel disease and / or migraine headaches.Other causes for white matter type changes such as that secondaryto; vasculitis, inflammatory process or demyelinating process feltto be secondary considerations.  . Leg swelling   . Neuralgia and neuritis   . Tenosynovitis     left, MRI 05/2003  .  Herniated disc     h/o ruptured disk, laminectomy L4-L5, Dr. Montez Morita  . Breast cancer 1996, 2001    f/b Dr. Myrle Sheng. s/p masectomy +radation. w/ metastatic disease, now on Femara.  . Tobacco abuse   . Hx of radiation therapy 06/21/02 -07/13/02    T1-T6  . Bone metastases     breast primary, T spine  . Shortness of breath     exertion  . Hx of migraines   . Hx of radiation therapy 04/21/12    T3-T4 spinal SRS  . Breast cancer metastasized to multiple sites 07/12/2012    Past Surgical History  Procedure Laterality Date  . Abdominal hysterectomy    . Cesarean section    . Mastectomy      left  . Laminectomy      L4-L5  . Joint replacement      Left Knee  . Back surgery    . Breast surgery    . Amputation  11/26/2011    Procedure: AMPUTATION DIGIT;  Surgeon: Kennieth Rad, MD;  Location: [facility] OR;  Service: Orthopedics;  Laterality: Bilateral;  PHALANGECTOMY  - Right fifth toe and left third toe  . Rotator cuff repair      pt does not remember year of surgery  . Thoracic laminectomy  04/01/12    T 2-4-metastatic tumor resection    Family History  Problem Relation Age of Onset  . Anesthesia problems Neg Hx   .  Hypotension Neg Hx   . Malignant hyperthermia Neg Hx   . Pseudochol deficiency Neg Hx     History  Substance Use Topics  . Smoking status: Current Every Day Smoker -- 0.50 packs/day for 50 years    Types: Cigarettes  . Smokeless tobacco: Never Used     Comment: smoking a little less  . Alcohol Use: No    OB History   Grav Para Term Preterm Abortions TAB SAB Ect Mult Living                  Review of Systems  Musculoskeletal: Positive for back pain.  All other systems reviewed and are negative.    Allergies  Banana  Home Medications   Current Outpatient Rx  Name  Route  Sig  Dispense  Refill  . albuterol (PROVENTIL HFA;VENTOLIN HFA) 108 (90 BASE) MCG/ACT inhaler   Inhalation   Inhale 2 puffs into the lungs every 6 (six) hours as needed. Shortness  of breath   18 g   5   . diazepam (VALIUM) 5 MG tablet   Oral   Take 5-10 mg by mouth every 6 (six) hours as needed for anxiety.         [name] morphine (MSIR) 15 MG tablet   Oral   Take 1-2 tablets (15-30 mg total) by mouth every 4 (four) hours as needed for pain.   100 tablet   0   . SUMAtriptan (IMITREX) 50 MG tablet   Oral   Take 50 mg by mouth every 2 (two) hours as needed for migraine.           BP 173/88  Pulse 93  Temp(Src) 98.6 F (37 C) (Oral)  Resp 18  SpO2 98%  Physical Exam  Nursing note and vitals reviewed. Constitutional: She appears well-developed and well-nourished.  Tearful, anxious  HENT:  Head: Normocephalic and atraumatic.  Mouth/Throat: Oropharynx is clear and moist. No oropharyngeal exudate.  Eyes: Conjunctivae and EOM are normal. Pupils are equal, round, and reactive to light. Right eye exhibits no discharge. Left eye exhibits no discharge. No scleral icterus.  Neck: Normal range of motion. Neck supple. No JVD present. No thyromegaly present.  Cardiovascular: Normal rate, regular rhythm, normal heart sounds and intact distal pulses.  Exam reveals no gallop and no friction rub.   No murmur heard. Pulmonary/Chest: Effort normal and breath sounds normal. No respiratory distress. She has no wheezes. She has no rales.  Abdominal: Soft. Bowel sounds are normal. She exhibits no distension and no mass. There is no tenderness.  Musculoskeletal: Normal range of motion. She exhibits tenderness ( Tender to palpation in the upper right lateral thoracic area, no spinal tenderness). She exhibits no edema.  Lymphadenopathy:    She has no cervical adenopathy.  Neurological: She is alert. Coordination normal.  Moving all extremities without difficulty, she is able to lift both legs off the bed, normal grips bilaterally, normal reflexes at the patellar tendons bilaterally, normal sensation to light touch and pinprick in the bilateral lower extremities  Skin: Skin is  warm and dry. No rash noted. No erythema.  Psychiatric: She has a normal mood and affect. Her behavior is normal.    ED Course  Procedures (including critical care time)  Labs Reviewed - No data to display No results found.   1. Back pain       MDM  The patient appears to have increased thoracic pain but there is no neurologic symptoms. Will discuss  with neurology as the patient has been seen in the past and has had multiple operations for spinal cord metastases, vertebral column metastases. Pain medication given, IV placed   D/w Dr. Mikal Plane - agrees with pain control and home - no indication for emergent neuroimaging given no neuro symptoms.   ED ECG REPORT  I personally interpreted this EKG   Date: 12/04/2012   Rate: 69  Rhythm: normal sinus rhythm  QRS Axis: normal  Intervals: normal  ST/T Wave abnormalities: nonspecific T wave changes  Conduction Disutrbances:none  Narrative Interpretation:   Old EKG Reviewed: none available  Patient appears stable for discharge, she has ambulated back to the bathroom and back to her room without any difficulty.   Meds given in ED:  Medications  HYDROmorphone (DILAUDID) injection 2 mg (2 mg Intravenous Given 12/04/12 2049)  ondansetron (ZOFRAN) injection 4 mg (4 mg Intravenous Given 12/04/12 2049)    New Prescriptions   No medications on file      Vida Roller, MD 12/04/12 2255

## 2012-12-06 ENCOUNTER — Emergency Department (HOSPITAL_COMMUNITY)
Admission: EM | Admit: 2012-12-06 | Discharge: 2012-12-07 | Disposition: A | Discharge status: Home / Self Care | Payer: Medicare HMO | Attending: Emergency Medicine | Admitting: Emergency Medicine

## 2012-12-06 ENCOUNTER — Encounter (HOSPITAL_COMMUNITY): Payer: Self-pay | Admitting: Emergency Medicine

## 2012-12-06 ENCOUNTER — Emergency Department (HOSPITAL_COMMUNITY): Payer: Medicare HMO

## 2012-12-06 DIAGNOSIS — M549 Dorsalgia, unspecified: Secondary | ICD-10-CM

## 2012-12-06 DIAGNOSIS — G43909 Migraine, unspecified, not intractable, without status migrainosus: Secondary | ICD-10-CM | POA: Insufficient documentation

## 2012-12-06 DIAGNOSIS — Z853 Personal history of malignant neoplasm of breast: Secondary | ICD-10-CM | POA: Insufficient documentation

## 2012-12-06 DIAGNOSIS — Z8709 Personal history of other diseases of the respiratory system: Secondary | ICD-10-CM | POA: Insufficient documentation

## 2012-12-06 DIAGNOSIS — Z8639 Personal history of other endocrine, nutritional and metabolic disease: Secondary | ICD-10-CM | POA: Insufficient documentation

## 2012-12-06 DIAGNOSIS — M546 Pain in thoracic spine: Secondary | ICD-10-CM | POA: Insufficient documentation

## 2012-12-06 DIAGNOSIS — Z8739 Personal history of other diseases of the musculoskeletal system and connective tissue: Secondary | ICD-10-CM | POA: Insufficient documentation

## 2012-12-06 DIAGNOSIS — J45909 Unspecified asthma, uncomplicated: Secondary | ICD-10-CM | POA: Insufficient documentation

## 2012-12-06 DIAGNOSIS — F17200 Nicotine dependence, unspecified, uncomplicated: Secondary | ICD-10-CM | POA: Insufficient documentation

## 2012-12-06 DIAGNOSIS — Z9889 Other specified postprocedural states: Secondary | ICD-10-CM | POA: Insufficient documentation

## 2012-12-06 DIAGNOSIS — Z8669 Personal history of other diseases of the nervous system and sense organs: Secondary | ICD-10-CM | POA: Insufficient documentation

## 2012-12-06 DIAGNOSIS — Z862 Personal history of diseases of the blood and blood-forming organs and certain disorders involving the immune mechanism: Secondary | ICD-10-CM | POA: Insufficient documentation

## 2012-12-06 DIAGNOSIS — G8929 Other chronic pain: Secondary | ICD-10-CM | POA: Insufficient documentation

## 2012-12-06 DIAGNOSIS — Z923 Personal history of irradiation: Secondary | ICD-10-CM | POA: Insufficient documentation

## 2012-12-06 DIAGNOSIS — Z85830 Personal history of malignant neoplasm of bone: Secondary | ICD-10-CM | POA: Insufficient documentation

## 2012-12-06 DIAGNOSIS — I10 Essential (primary) hypertension: Secondary | ICD-10-CM | POA: Insufficient documentation

## 2012-12-06 LAB — CBC
MCH: 29.3 pg (ref 26.0–34.0)
Platelets: 233 10*3/uL (ref 150–400)
RBC: 4.2 MIL/uL (ref 3.87–5.11)

## 2012-12-06 LAB — BASIC METABOLIC PANEL
CO2: 28 mEq/L (ref 19–32)
Calcium: 9.4 mg/dL (ref 8.4–10.5)
GFR calc Af Amer: >90 mL/min (ref >90)
GFR calc non Af Amer: 84 mL/min — ABNORMAL LOW (ref >90)
Sodium: 140 mEq/L (ref 135–145)

## 2012-12-06 MED ORDER — DEXAMETHASONE SODIUM PHOSPHATE 10 MG/ML IJ SOLN
10.0000 mg | Freq: Once | INTRAMUSCULAR | Status: AC
  Administered 2012-12-07: 10 mg via INTRAVENOUS
  Filled 2012-12-06: qty 1

## 2012-12-06 MED ORDER — SODIUM CHLORIDE 0.9 % IV BOLUS (SEPSIS)
1000.0000 mL | Freq: Once | INTRAVENOUS | Status: AC
  Administered 2012-12-06: 1000 mL via INTRAVENOUS

## 2012-12-06 MED ORDER — HYDROMORPHONE HCL PF 1 MG/ML IJ SOLN
1.0000 mg | Freq: Once | INTRAMUSCULAR | Status: AC
  Administered 2012-12-06: 1 mg via INTRAVENOUS
  Filled 2012-12-06: qty 1

## 2012-12-06 NOTE — ED Notes (Signed)
Pt c/o mid back pain x 3 days; pt sts feel x 1 week ago

## 2012-12-06 NOTE — ED Provider Notes (Signed)
History     CSN: [id number]  Arrival date & time 12/06/12  1842   First MD Initiated Contact with Patient 12/06/12 1853      Chief Complaint  Patient presents with  . Back Pain    (Consider location/radiation/quality/duration/timing/severity/associated sxs/prior treatment) Patient is a 67 y.o. female presenting with back pain.  Back Pain Location:  Thoracic spine Quality:  Aching and stabbing Radiates to: chest. Pain severity:  Severe Pain is:  Same all the time Onset quality:  Gradual Duration: chronically but with acute worsening for 3 days. Timing:  Constant Progression:  Worsening Chronicity:  Chronic Context comment:  Metastatic breast ca with thoracic mass sp removal Relieved by:  Nothing Worsened by:  Ambulation, bending, sneezing, twisting and touching Associated symptoms: no abdominal pain, no bladder incontinence, no bowel incontinence, no chest pain, no dysuria, no fever, no numbness and no paresthesias     Past Medical History  Diagnosis Date  . Cancer 1996    breast  . Hyperlipidemia   . Hypertension   . Asthma     best PEF=400  . 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 Chronic low back pain     MRI 2010 showed right sided disease with paracentral  disc protusion at L5-S1 which contacts and displaces the right S1 nerve root within the lateral recess  . Migraine headache     MRI 11/18/10: Mild progression of prominent white matter type changes which may be related to a small vessel disease and / or migraine headaches.Other causes for white matter type changes such as that secondaryto; vasculitis, inflammatory process or demyelinating process feltto be secondary considerations.  . Leg swelling   . Neuralgia and neuritis   . Tenosynovitis     left, MRI 05/2003  . Herniated disc     h/o ruptured disk, laminectomy L4-L5, Dr. Montez Morita  . Breast cancer 1996, 2001    f/b Dr.  Myrle Sheng. s/p masectomy +radation. w/ metastatic disease, now on Femara.  . Tobacco abuse   . Hx of radiation therapy 06/21/02 -07/13/02    T1-T6  . Bone metastases     breast primary, T spine  . Shortness of breath     exertion  . Hx of migraines   . Hx of radiation therapy 04/21/12    T3-T4 spinal SRS  . Breast cancer metastasized to multiple sites 07/12/2012    Past Surgical History  Procedure Laterality Date  . Abdominal hysterectomy    . Cesarean section    . Mastectomy      left  . Laminectomy      L4-L5  . Joint replacement      Left Knee  . Back surgery    . Breast surgery    . Amputation  11/26/2011    Procedure: AMPUTATION DIGIT;  Surgeon: Kennieth Rad, MD;  Location: [facility] OR;  Service: Orthopedics;  Laterality: Bilateral;  PHALANGECTOMY  - Right fifth toe and left third toe  . Rotator cuff repair      pt does not remember year of surgery  . Thoracic laminectomy  04/01/12    T 2-4-metastatic tumor resection    Family History  Problem Relation Age of Onset  . Anesthesia problems Neg Hx   . Hypotension Neg Hx   . Malignant hyperthermia Neg Hx   . Pseudochol deficiency Neg Hx  History  Substance Use Topics  . Smoking status: Current Every Day Smoker -- 0.50 packs/day for 50 years    Types: Cigarettes  . Smokeless tobacco: Never Used     Comment: smoking a little less  . Alcohol Use: No    OB History   Grav Para Term Preterm Abortions TAB SAB Ect Mult Living                  Review of Systems  Constitutional: Negative for fever and chills.  HENT: Negative for congestion, sore throat and rhinorrhea.   Eyes: Negative for photophobia and visual disturbance.  Respiratory: Negative for cough and shortness of breath.   Cardiovascular: Negative for chest pain and leg swelling.  Gastrointestinal: Negative for nausea, vomiting, abdominal pain, diarrhea, constipation and bowel incontinence.  Endocrine: Negative for polyphagia and polyuria.  Genitourinary:  Negative for bladder incontinence, dysuria, flank pain, vaginal bleeding, vaginal discharge and enuresis.  Musculoskeletal: Positive for back pain. Negative for gait problem.  Skin: Negative for color change and rash.  Neurological: Negative for dizziness, syncope, light-headedness, numbness and paresthesias.  Hematological: Negative for adenopathy. Does not bruise/bleed easily.  All other systems reviewed and are negative.    Allergies  Banana  Home Medications   Current Outpatient Rx  Name  Route  Sig  Dispense  Refill  . albuterol (PROVENTIL HFA;VENTOLIN HFA) 108 (90 BASE) MCG/ACT inhaler   Inhalation   Inhale 2 puffs into the lungs every 6 (six) hours as needed. Shortness of breath   18 g   5   . diazepam (VALIUM) 5 MG tablet   Oral   Take 5-10 mg by mouth every 6 (six) hours as needed for anxiety.         [name] morphine (MSIR) 15 MG tablet   Oral   Take 1-2 tablets (15-30 mg total) by mouth every 4 (four) hours as needed for pain.   100 tablet   0   . SUMAtriptan (IMITREX) 50 MG tablet   Oral   Take 50 mg by mouth every 2 (two) hours as needed for migraine.           BP 150/94  Pulse 88  Temp(Src) 97.6 F (36.4 C) (Oral)  Resp 16  SpO2 99%  Physical Exam  Vitals reviewed. Constitutional: She is oriented to person, place, and time. She appears well-developed and well-nourished.  HENT:  Head: Normocephalic and atraumatic.  Right Ear: External ear normal.  Left Ear: External ear normal.  Eyes: Conjunctivae and EOM are normal. Pupils are equal, round, and reactive to light.  Neck: Normal range of motion. Neck supple.  Cardiovascular: Normal rate, regular rhythm, normal heart sounds and intact distal pulses.   Pulmonary/Chest: Effort normal and breath sounds normal.  Abdominal: Soft. Bowel sounds are normal. There is no tenderness.  Musculoskeletal: Normal range of motion.       Cervical back: Normal.       Thoracic back: She exhibits tenderness, bony  tenderness and pain. She exhibits no edema and no laceration.       Lumbar back: Normal.  Neurological: She is alert and oriented to person, place, and time. She has normal strength and normal reflexes. No sensory deficit.  Skin: Skin is warm and dry.    ED Course  Procedures (including critical care time)  Labs Reviewed  BASIC METABOLIC PANEL - Abnormal; Notable for the following:    Potassium 3.4 (*)    GFR calc non Af Amer 84 (*)  All other components within normal limits  CBC   CBC (Final result)   Component (Lab Inquiry)      Collection Time Result Time WBC RBC HGB HCT MCV    12/06/12 19:40:00 12/06/12 19:52:26 5.3 4.20 12.3 37.1 88.3         Collection Time Result Time MCH MCHC RDW PLT    12/06/12 19:40:00 12/06/12 19:52:26 29.3 33.2 15.0 233                   Basic metabolic panel (Final result) Abnormal Component (Lab Inquiry)      Collection Time Result Time NA K CL CO2 GLUCOSE    12/06/12 19:40:00 12/06/12 20:22:17 140 3.4 (L) 106 28 90         Collection Time Result Time BUN Creatinine, Ser CALCIUM GFR calc non Af Amer GFR calc Af Amer    12/06/12 19:40:00 12/06/12 20:22:17 9 0.78 9.4 84 (L) >90        The eGFR has been calculated using the CKD EPI equation. This calculation has not been validated in all clinical situations. eGFR's persistently <90 mL/min signify possible Chronic Kidney Disease.     Dg Cervical Spine 2-3 Views  12/06/2012   *RADIOLOGY REPORT*  Clinical Data: Back pain and history of thoracic spinal fusion.  CERVICAL SPINE - 2-3 VIEW  Comparison: 09/16/2010  Findings: Stable cervical spondylosis with multilevel disc disease present and prominent osteophytes at C4-5, C5-6 and C6-7.  There is no evidence of spondylolisthesis.  No bony lesions or fractures are identified.  No soft tissue swelling is seen.  Atherosclerotic calcification is noted at both carotid bifurcations.  Superior aspect of thoracic fusion rods are visible.  IMPRESSION: Stable multilevel  cervical spondylosis.   Original Report Authenticated By: Irish Lack, M.D.   Dg Thoracic Spine 2 View  12/06/2012   *RADIOLOGY REPORT*  Clinical Data: Back pain and history of prior thoracic spine fusion.  THORACIC SPINE - 2 VIEW  Comparison: 09/09/2012  Findings: Alignment of posterior thoracic fusion hardware is stable with transpedicular screws at the T1, T2, T5 and T6 levels showing stable positioning without evidence of fracture.  Vertical connecting rods are also stable in appearance and show no evidence of fracture.  No interval thoracic fractures are identified.  There is no evidence of abnormal lucency or focal bony lesions.  IMPRESSION: Stable appearance status post prior thoracic spine fusion.  No acute abnormalities are identified.   Original Report Authenticated By: Irish Lack, M.D.     1. Back pain       MDM  67 y.o. female  with pertinent PMH of metastatic breast ca with thoracic mets and chronic back pain presents with acute on chronic thoracic spinal pain, consistent with last visit 2 days ago for same.  No fevers, systemic symptoms, change in BM or other infectious signs.  Pt tearful on exam, initially noncompliant with neuro exam, however after pain medication without focal deficits.  Plain film of spine unremarkable for pt condition.  As she has no focal neuro deficits, signs of acute fracture, and her pain has been controlled, feel her stable to dc home with spinal fu.  Given decadron, and she has narcotic pain medication at home.  She was given strict return precautions for worsening neurologic symptoms, fever, gi symptoms, or change in character of symptoms.  She voiced understanding, agreement with plan, and agreed to fu.     Labs and imaging as above reviewed by myself and attending,Dr.  Hyacinth Meeker, with whom case was discussed.   1. Back pain             Noel Gerold, MD 12/07/12 0025

## 2012-12-07 ENCOUNTER — Other Ambulatory Visit: Payer: Self-pay | Admitting: *Deleted

## 2012-12-07 DIAGNOSIS — C50919 Malignant neoplasm of unspecified site of unspecified female breast: Secondary | ICD-10-CM

## 2012-12-07 NOTE — Progress Notes (Signed)
FTKA for last OV. Will attempt to have her come in when next Faslodex is due. POF to scheduler.

## 2012-12-08 ENCOUNTER — Telehealth: Payer: Self-pay | Admitting: *Deleted

## 2012-12-08 DIAGNOSIS — C50919 Malignant neoplasm of unspecified site of unspecified female breast: Secondary | ICD-10-CM

## 2012-12-08 DIAGNOSIS — C7951 Secondary malignant neoplasm of bone: Secondary | ICD-10-CM

## 2012-12-08 NOTE — Telephone Encounter (Addendum)
Message from pt reporting severe back pain, requesting Morphine refill and to see MD ASAP. Attempted to return call, no answer. Reviewed with MD, order received to schedule pt for 6/12 at 1230. Request to schedulers to call pt.

## 2012-12-08 NOTE — ED Provider Notes (Signed)
I saw and evaluated the patient, reviewed the resident's note and I agree with the findings and plan.  I personally evaluated the patient, she is reducible tenderness to palpation in her upper back, this is similar to the presentation from several days ago, imaging does not reveal any acute findings the patient does not have any neurologic compromise. The patient is stable for discharge.  Vida Roller, MD 12/08/12 [phone]

## 2012-12-09 ENCOUNTER — Telehealth: Payer: Self-pay | Admitting: Oncology

## 2012-12-09 ENCOUNTER — Ambulatory Visit: Payer: Medicare HMO | Admitting: Oncology

## 2012-12-09 NOTE — Telephone Encounter (Signed)
Gave pt appt for ML visit only on 12/21/12

## 2012-12-09 NOTE — Telephone Encounter (Signed)
Talked to tp and gave her appt for 6/12 12:30 and pt does not have a ride , nurse notified

## 2012-12-20 ENCOUNTER — Other Ambulatory Visit: Payer: Self-pay | Admitting: Neurosurgery

## 2012-12-20 DIAGNOSIS — M546 Pain in thoracic spine: Secondary | ICD-10-CM

## 2012-12-21 ENCOUNTER — Telehealth: Payer: Self-pay | Admitting: *Deleted

## 2012-12-21 ENCOUNTER — Ambulatory Visit (HOSPITAL_BASED_OUTPATIENT_CLINIC_OR_DEPARTMENT_OTHER): Payer: Medicaid Other | Admitting: Nurse Practitioner

## 2012-12-21 ENCOUNTER — Telehealth: Payer: Self-pay | Admitting: Oncology

## 2012-12-21 VITALS — BP 141/78 | HR 84 | Temp 97.8°F | Resp 20 | Ht 68.0 in | Wt 178.8 lb

## 2012-12-21 DIAGNOSIS — C7952 Secondary malignant neoplasm of bone marrow: Secondary | ICD-10-CM

## 2012-12-21 DIAGNOSIS — C801 Malignant (primary) neoplasm, unspecified: Secondary | ICD-10-CM

## 2012-12-21 DIAGNOSIS — C7951 Secondary malignant neoplasm of bone: Secondary | ICD-10-CM

## 2012-12-21 DIAGNOSIS — C50919 Malignant neoplasm of unspecified site of unspecified female breast: Secondary | ICD-10-CM

## 2012-12-21 DIAGNOSIS — C800 Disseminated malignant neoplasm, unspecified: Secondary | ICD-10-CM

## 2012-12-21 MED ORDER — MORPHINE SULFATE 15 MG PO TABS: 15.0000 mg | ORAL_TABLET | ORAL | Status: DC | PRN

## 2012-12-21 MED ORDER — MORPHINE SULFATE 15 MG PO TABS
15.0000 mg | ORAL_TABLET | Freq: Once | ORAL | Status: AC
  Administered 2012-12-21: 15 mg via ORAL

## 2012-12-21 NOTE — Progress Notes (Addendum)
OFFICE PROGRESS NOTE  Interval history:  Ms. Brazee returns prior to scheduled followup. She is having increased mid to upper back pain. She recently ran out of  morphine. Prior to running out she was taking 4-8 tablets a day. No focal extremity weakness. No bowel or bladder dysfunction. She continues to note bleeding from the wound at the left chest wall. She has recently noted a "soreness" as well. She continues to lose weight. She denies nausea/vomiting.   Objective: Blood pressure 141/78, pulse 84, temperature 97.8 F (36.6 C), temperature source Oral, resp. rate 20, height 5\' 8"  (1.727 m), weight 178 lb 12.8 oz (81.103 kg).  Oropharynx is without thrush or ulceration. Pea-sized left supraclavicular lymph node. No palpable axillary lymph nodes. Lungs clear. Regular cardiac rhythm. Abdomen soft. No hepatomegaly. Extremities without edema. Motor strength 5 over 5. Tenderness at the left upper back. 2 cm firm superficially ulcerated lesion at the left mastectomy scar.  Lab Results: Lab Results  Component Value Date   WBC 5.3 12/06/2012   HGB 12.3 12/06/2012   HCT 37.1 12/06/2012   MCV 88.3 12/06/2012   PLT 233 12/06/2012    Chemistry:    Chemistry      Component Value Date/Time   NA 140 12/06/2012 1940   NA 143 10/14/2012 1047   K 3.4* 12/06/2012 1940   K 3.8 10/14/2012 1047   CL 106 12/06/2012 1940   CL 107 10/14/2012 1047   CO2 28 12/06/2012 1940   CO2 26 10/14/2012 1047   BUN 9 12/06/2012 1940   BUN 13.5 10/14/2012 1047   CREATININE 0.78 12/06/2012 1940   CREATININE 0.8 10/14/2012 1047   CREATININE 0.89 10/23/2010 1514      Component Value Date/Time   CALCIUM 9.4 12/06/2012 1940   CALCIUM 9.4 10/14/2012 1047   ALKPHOS 92 10/14/2012 1047   ALKPHOS 86 10/23/2010 1514   AST 11 10/14/2012 1047   AST 11 10/23/2010 1514   ALT <6 Repeated and Verified 10/14/2012 1047   ALT 8 10/23/2010 1514   BILITOT 0.37 10/14/2012 1047   BILITOT 0.3 10/23/2010 1514       Studies/Results: Dg Cervical Spine 2-3  Views  12/06/2012   *RADIOLOGY REPORT*  Clinical Data: Back pain and history of thoracic spinal fusion.  CERVICAL SPINE - 2-3 VIEW  Comparison: 09/16/2010  Findings: Stable cervical spondylosis with multilevel disc disease present and prominent osteophytes at C4-5, C5-6 and C6-7.  There is no evidence of spondylolisthesis.  No bony lesions or fractures are identified.  No soft tissue swelling is seen.  Atherosclerotic calcification is noted at both carotid bifurcations.  Superior aspect of thoracic fusion rods are visible.  IMPRESSION: Stable multilevel cervical spondylosis.   Original Report Authenticated By: Irish Lack, M.D.   Dg Thoracic Spine 2 View  12/06/2012   *RADIOLOGY REPORT*  Clinical Data: Back pain and history of prior thoracic spine fusion.  THORACIC SPINE - 2 VIEW  Comparison: 09/09/2012  Findings: Alignment of posterior thoracic fusion hardware is stable with transpedicular screws at the T1, T2, T5 and T6 levels showing stable positioning without evidence of fracture.  Vertical connecting rods are also stable in appearance and show no evidence of fracture.  No interval thoracic fractures are identified.  There is no evidence of abnormal lucency or focal bony lesions.  IMPRESSION: Stable appearance status post prior thoracic spine fusion.  No acute abnormalities are identified.   Original Report Authenticated By: Irish Lack, M.D.    Medications: I have reviewed  the patient's current medications.  Assessment/Plan:  1.Metastatic breast cancer - initially diagnosed with breast cancer in 1996 status post left mastectomy and axillary lymph node dissection. She developed a chest wall recurrence in 2001 and completed radiation. In April 2003, CT showed mediastinal, internal mammary, and right axillary adenopathy and two liver lesions. MRI of the thoracic spine June 18, 2002 revealed osseous abnormalities at T3 and T4 suspicious for metastatic disease with extension into the T3 pedicle.  There was extra osseous extension compressing and displacing the thoracic cord. She completed radiation from T1 through T6 June 21, 2002 through July 13, 2002. CT scan Nov 11, 2002 showed progression of metastatic disease with precardiac nodes, left pleural and lower lobe disease, subcutaneous nodules at the left anterior chest wall and progressive metastatic disease involving the liver. She began Femara in May/early June 2004 with subsequent resolution of chest wall nodules. CT scans January 14, 2005, of the chest and abdomen revealed findings of decreased pleural-based metastatic disease in the lower left hemithorax and no new or progressive disease in the chest. Small lesion in the right liver measuring 13.0 x 10.0 mm was reported as stable when compared to her previous study. No other liver lesions were seen. Bone scan January 22, 2006 showed degenerative changes. She discontinued Femara February 2009 and was lost a followup. She presented in March 2010 with an ulcerated chest wall lesion and an elevated CA27.29. Restaging CTs September 18, 2008 showed a 1.3 x 2.2 cm subdermal lesion at the left anterior chest, multiple enlarged metastases in the left hemithorax at the mediastinum and pleura felt to represent necrotic lymph nodes versus pleural metastases. Right axillary lymph node was decreased in size compared to 2006. There were no suspicious bone lesions. A previously noted lesion in the inferior right liver was not clearly seen. She resumed Femara March 2010. On October 23, 2008 the chest wall lesion had improved. The CA27.29 on October 23, 2008 was stable at 60. She discontinued Femara in June 2010 and CTs of the abdomen and pelvis April 06, 2009 showed slight decrease in the size of a pleural-based nodule in the left lower lobe. There was no other evidence for metastatic disease in the abdomen or pelvis. She resumed Femara on April 11, 2009. CA27.29 returned at 87 on April 11, 2009. CT of the abdomen and  pelvis on May 30, 2009 showed an increase in multiple pericardial masses, increased left chest wall subcutaneous nodule, stable pleural-based metastases and stable liver lesions. She discontinued Femara in November 2010 due to nausea and vomiting. Femara was resumed following an office visit June 06, 2009. Dr. Myrle Sheng reviewed a CT from May 30, 2009 with a [name] radiologist comparing the scan to scans from March 2010 and October 2010. In addition, a CT scan from 2006 was reviewed. The pleural-based lesions in the left chest had not changed significantly since March 2010. The pericardial masses appeared slightly larger. Both the pericardial masses and pleural-based masses were larger compared to the CT from 2006. She discontinued Femara January 2011. The left chest wall nodule recurred during the time she was off of Femara. Femara was resumed following an office visit 10/02/2009. The nodule again improved. Restaging CT of the chest 08/16/2010 was stable compared to a CT from 03/29/2010. Most recent CA 27-29 on 01/16/2012 was stable at 340. Femara was continued. CT the chest 02/23/2012 with progressive pleural-based tumor in the left chest with probable involvement of T3. Femara was discontinued. MRI of the thoracic  spine on 03/05/2012 showed severe T3 and T4 metastatic disease with mild pathologic compression fractures and epidural extension causing some cord compression at T3. There was no definite cord edema. There was associated extensive bilateral neural foraminal involvement at the T2 and T3 nerve levels greater on the left. There was associated very bulky mediastinal and pleural metastatic disease. She underwent a laminectomy procedure on 04/01/2012 with resection of epidural metastatic tumor. 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 The tumor was ER positive 100%; PR positive 53%; HER-2/neu by CISH showed amplification. She declined a trial of chemotherapy  and Herceptin. She began Faslodex on 07/12/2012. The CA 27-29 tumor marker was further increased on 10/14/2012. 2. Ulcerated left chest lesion, likely a metastatic lesion.  3. Severe right low back/buttock pain radiating to the left leg October 2010 with MRI on April 06, 2009 revealing a disk protrusion at L5-S1 displacing the right S1 nerve root. She was evaluated by Dr. Jordan Likes. The pain has resolved.  4. Hospitalization December 1 through May 31, 2009 with nausea, vomiting and abdominal pain - resolved.  5. 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  6. Pain at the right back/ iliac region October 25, 2008.  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  8. Status post left knee replacement.  9. Chronic edema of the left lower leg.  10. "Migraines". She takes Imitrex as needed.  11. Falls and balance problems occurring over the past year. She has been referred to neurology. No difficulty with ambulation today.  12. Right breast with area of firmness on exam March 2013 -negative right breast mammogram and ultrasound 09/15/2011 . Negative right breast mammogram 09/16/2012.  13. Pain at upper back. Resolved following the laminectomy procedure. She is having increased pain. She is scheduled for CT and MRI scans per Dr. Newell Coral. 14. Unsteady gait on 04/14/2012-much improved.  15. Nausea/vomiting following Faslodex 07/26/2012.  16. Weight loss-likely secondary to progression of the metastatic breast cancer. She continues to lose weight.  Disposition-Ms. Marsico has progressive metastatic breast cancer. She is having increased back pain and is losing weight. Dr. Truett Perna discussed discontinuation of Faslodex and initiation of Taxol on a day 1 day 8 schedule of a 21 day cycle with Herceptin and Pertuzumab on day 1. We discussed potential toxicities associated with Taxol including an allergic reaction, myelosuppression, nausea, mouth  sores, diarrhea, neuropathy, hair loss. We discussed potential toxicities associated with Herceptin and Pertuzumab including an allergic reaction and cardiotoxicity.   She is agreeable to proceeding with Taxol/Herceptin/Pertuzumab as outlined above. She will attend a chemotherapy education class. We discussed placement of a Port-A-Cath. She declines a Port-A-Cath at this time. A 2-D echo was done on 07/01/2012 showing an ejection fraction in the range of 55-60%.  We are scheduling her to return on 12/23/2012 to begin treatment. She will return for the day 8 Taxol on 12/30/2012. We will see her in followup on 01/13/2013 prior to cycle 2.  After confirming that a friend drove her to today's office visit she was given MSIR 15 mg by mouth. She was also given a prescription for MSIR 15 mg 1-2 tablets every 4 hours as needed.  Patient seen with Dr. Truett Perna.   Lonna Cobb ANP/GNP-BC    I examined Ms. Buikema and discussed treatment options with her. She has been maintained on Faslodex since January of 2014. There is clinical evidence of disease progression. I suspect the back pain  is related to progressive metastatic breast cancer involving the spine or posterior chest. I recommend discontinuing Faslodex. We have previously recommended Herceptin based therapy, but she has declined in the past. She agrees to Taxol/Herceptin/pertuzumab. I discussed the potential toxicities associated with this regimen and she agrees to proceed. She is scheduled for spine imaging later this week. We will follow her x-rays, clinical status, and the CA 27-29 as markers of tumor response.  She requested we telephone her son with the treatment plan. I will attempt to contact him by telephone.  Mancel Bale, M.D.

## 2012-12-21 NOTE — Telephone Encounter (Signed)
Gave pt appt for 7/2 for lab , chemo and chemo class, pt refused to have chemo on 6/27, MD notified , pt aware of all appt for June and July 2014

## 2012-12-21 NOTE — Telephone Encounter (Signed)
Per staff phone call and POF I have schedueld appts.  [name]  

## 2012-12-23 ENCOUNTER — Ambulatory Visit: Payer: Medicare HMO

## 2012-12-24 ENCOUNTER — Ambulatory Visit
Admission: RE | Admit: 2012-12-24 | Discharge: 2012-12-24 | Disposition: A | Discharge status: Home / Self Care | Payer: Medicare HMO | Source: Ambulatory Visit | Attending: Neurosurgery | Admitting: Neurosurgery

## 2012-12-24 DIAGNOSIS — M546 Pain in thoracic spine: Secondary | ICD-10-CM

## 2012-12-26 ENCOUNTER — Other Ambulatory Visit: Payer: Self-pay | Admitting: Oncology

## 2012-12-26 ENCOUNTER — Ambulatory Visit
Admission: RE | Admit: 2012-12-26 | Discharge: 2012-12-26 | Disposition: A | Discharge status: Home / Self Care | Payer: Medicare HMO | Source: Ambulatory Visit | Attending: Oncology | Admitting: Oncology

## 2012-12-26 DIAGNOSIS — Z853 Personal history of malignant neoplasm of breast: Secondary | ICD-10-CM

## 2012-12-26 MED ORDER — GADOBENATE DIMEGLUMINE 529 MG/ML IV SOLN
17.0000 mL | Freq: Once | INTRAVENOUS | Status: AC | PRN
  Administered 2012-12-26: 17 mL via INTRAVENOUS

## 2012-12-28 ENCOUNTER — Other Ambulatory Visit: Payer: Self-pay | Admitting: *Deleted

## 2012-12-28 MED ORDER — DEXAMETHASONE 4 MG PO TABS: 4.0000 mg | ORAL_TABLET | Freq: Two times a day (BID) | ORAL | Status: DC

## 2012-12-28 NOTE — Telephone Encounter (Signed)
Called pt, instructed her to take Decadron 4 mg twice daily. Stated she ran out of med. Stressed importance of resuming Decadron today. She voiced understanding.

## 2012-12-29 ENCOUNTER — Other Ambulatory Visit (HOSPITAL_BASED_OUTPATIENT_CLINIC_OR_DEPARTMENT_OTHER): Payer: Medicare HMO | Admitting: Lab

## 2012-12-29 ENCOUNTER — Telehealth: Payer: Self-pay | Admitting: *Deleted

## 2012-12-29 ENCOUNTER — Encounter: Payer: Self-pay | Admitting: Specialist

## 2012-12-29 ENCOUNTER — Other Ambulatory Visit: Payer: Self-pay | Admitting: Oncology

## 2012-12-29 ENCOUNTER — Other Ambulatory Visit: Payer: Self-pay | Admitting: *Deleted

## 2012-12-29 ENCOUNTER — Ambulatory Visit: Payer: Medicare HMO

## 2012-12-29 ENCOUNTER — Ambulatory Visit (HOSPITAL_BASED_OUTPATIENT_CLINIC_OR_DEPARTMENT_OTHER): Payer: Medicaid Other

## 2012-12-29 VITALS — BP 170/78 | HR 79 | Temp 98.6°F

## 2012-12-29 DIAGNOSIS — C50912 Malignant neoplasm of unspecified site of left female breast: Secondary | ICD-10-CM

## 2012-12-29 DIAGNOSIS — C50919 Malignant neoplasm of unspecified site of unspecified female breast: Secondary | ICD-10-CM

## 2012-12-29 DIAGNOSIS — Z5112 Encounter for antineoplastic immunotherapy: Secondary | ICD-10-CM

## 2012-12-29 DIAGNOSIS — C787 Secondary malignant neoplasm of liver and intrahepatic bile duct: Secondary | ICD-10-CM

## 2012-12-29 DIAGNOSIS — C7951 Secondary malignant neoplasm of bone: Secondary | ICD-10-CM

## 2012-12-29 DIAGNOSIS — C7931 Secondary malignant neoplasm of brain: Secondary | ICD-10-CM

## 2012-12-29 LAB — CBC WITH DIFFERENTIAL/PLATELET
Eosinophils Absolute: 0.1 10*3/uL (ref 0.0–0.5)
MONO#: 0.9 10*3/uL (ref 0.1–0.9)
NEUT#: 5.9 10*3/uL (ref 1.5–6.5)
Platelets: 251 10*3/uL (ref 145–400)
RBC: 4.37 10*6/uL (ref 3.70–5.45)
RDW: 14.9 % — ABNORMAL HIGH (ref 11.2–14.5)
WBC: 8.4 10*3/uL (ref 3.9–10.3)
lymph#: 1.5 10*3/uL (ref 0.9–3.3)

## 2012-12-29 LAB — COMPREHENSIVE METABOLIC PANEL (CC13)
ALT: 7 U/L (ref 0–55)
AST: 10 U/L (ref 5–34)
CO2: 29 mEq/L (ref 22–29)
Calcium: 9.5 mg/dL (ref 8.4–10.4)
Chloride: 104 mEq/L (ref 98–109)
Sodium: 140 mEq/L (ref 136–145)
Total Protein: 6.9 g/dL (ref 6.4–8.3)

## 2012-12-29 MED ORDER — PROCHLORPERAZINE MALEATE 10 MG PO TABS: 10.0000 mg | ORAL_TABLET | Freq: Four times a day (QID) | ORAL | Status: DC | PRN

## 2012-12-29 MED ORDER — ACETAMINOPHEN 325 MG PO TABS
650.0000 mg | ORAL_TABLET | Freq: Once | ORAL | Status: AC
  Administered 2012-12-29: 650 mg via ORAL

## 2012-12-29 MED ORDER — LORAZEPAM 1 MG PO TABS
0.5000 mg | ORAL_TABLET | Freq: Once | ORAL | Status: AC
  Administered 2012-12-29: 0.5 mg via ORAL

## 2012-12-29 MED ORDER — DIPHENHYDRAMINE HCL 50 MG/ML IJ SOLN: 25.0000 mg | Freq: Once | INTRAMUSCULAR | Status: DC

## 2012-12-29 MED ORDER — DEXAMETHASONE SODIUM PHOSPHATE 20 MG/5ML IJ SOLN: 20.0000 mg | Freq: Once | INTRAMUSCULAR | Status: DC

## 2012-12-29 MED ORDER — ONDANSETRON 8 MG/50ML IVPB (CHCC): 8.0000 mg | Freq: Once | INTRAVENOUS | Status: DC

## 2012-12-29 MED ORDER — TRASTUZUMAB CHEMO INJECTION 440 MG
8.0000 mg/kg | Freq: Once | INTRAVENOUS | Status: AC
  Administered 2012-12-29: 651 mg via INTRAVENOUS
  Filled 2012-12-29: qty 31

## 2012-12-29 MED ORDER — DIPHENHYDRAMINE HCL 25 MG PO CAPS
25.0000 mg | ORAL_CAPSULE | Freq: Once | ORAL | Status: AC
  Administered 2012-12-29: 25 mg via ORAL

## 2012-12-29 MED ORDER — SODIUM CHLORIDE 0.9 % IV SOLN
Freq: Once | INTRAVENOUS | Status: AC
  Administered 2012-12-29: 14:00:00 via INTRAVENOUS

## 2012-12-29 MED ORDER — FAMOTIDINE IN NACL 20-0.9 MG/50ML-% IV SOLN: 20.0000 mg | Freq: Once | INTRAVENOUS | Status: DC

## 2012-12-29 MED ORDER — SODIUM CHLORIDE 0.9 % IV SOLN
840.0000 mg | Freq: Once | INTRAVENOUS | Status: AC
  Administered 2012-12-29: 840 mg via INTRAVENOUS
  Filled 2012-12-29: qty 28

## 2012-12-29 MED ORDER — SODIUM CHLORIDE 0.9 % IV SOLN
80.0000 mg/m2 | Freq: Once | INTRAVENOUS | Status: DC
  Filled 2012-12-29: qty 26

## 2012-12-29 NOTE — Telephone Encounter (Signed)
Patient signed release of information for her family at this time.  Asked that the family decide upon a spokes person after Rinoa mentioned one son was not happy that he's not received a call.

## 2012-12-29 NOTE — Progress Notes (Signed)
I was called to the chemotherapy class today by the nurse in charge to meet with [name] [name].  When I arrived, the patient was very tearful, shaking, and anxious about her chemotherapy treatment, which was scheduled to take place this afternoon.  I talked with the patient while the nurse went to get food for her; she had no eaten anything today.  The patient was very frightened about the treatment.  She had no family or friend accompanying her.  Her nurse also went to get medication to help aleve the anxiety.  I listened, said prayer for her, gave her a healing prayer shawl, stayed with her until it was time for her to go to the treatment room, at which time she was somewhat more relaxed and less anxious.  Later in the afternoon, both I and a counseling intern went to check on the patient.  I informed the nurse in the infusion room that the patient had indicated she lives by herself and was to be picked up by a friend after treatment.  I will plan to call [name] tomorrow to check on her and follow up with her at her appointment next week.

## 2012-12-29 NOTE — Progress Notes (Signed)
Patient scheduled to receive 1st time Herceptin, Perjeta and Taxol today, per Dr. Truett Perna; Herceptin and Perjeta given today, but 1st time Taxol on hold to give tomorrow (12/30/12), because of time constraints; patient given a Taxi Voucher to return to infusion room tomorrow @ 7:30 am; spoke to family member about discharge instructions and plans for tomorrow; patient and Dickie La (family member) verbalized understanding.

## 2012-12-29 NOTE — Telephone Encounter (Addendum)
Patient's daughter -n -law, Gabriel Rung called asking how she is doing as she started her first chemotherapy today.  Also reports having called last week requesting to speak with provider.  Would kie to know patient's status, stage of cancer, does she need someone to live with her in the home etc.  This family lives in [place], [place] another son lives in [place]. And they need to know what Mom needs.  Will notify providers.  Monique asked to be called at [phone]. Per caller ID, this call originated from [phone].

## 2012-12-29 NOTE — Patient Instructions (Addendum)
[facility]cer Center Discharge Instructions for Patients Receiving Chemotherapy  Today you received the following chemotherapy agents : Herceptin, Perjeta, Taxol  To help prevent nausea and vomiting after your treatment, we encourage you to take your nausea medication as directed by your MD.   If you develop nausea and vomiting that is not controlled by your nausea medication, call the clinic.   BELOW ARE SYMPTOMS THAT SHOULD BE REPORTED IMMEDIATELY:  *FEVER GREATER THAN 100.5 F  *CHILLS WITH OR WITHOUT FEVER  NAUSEA AND VOMITING THAT IS NOT CONTROLLED WITH YOUR NAUSEA MEDICATION  *UNUSUAL SHORTNESS OF BREATH  *UNUSUAL BRUISING OR BLEEDING  TENDERNESS IN MOUTH AND THROAT WITH OR WITHOUT PRESENCE OF ULCERS  *URINARY PROBLEMS  *BOWEL PROBLEMS  UNUSUAL RASH Items with * indicate a potential emergency and should be followed up as soon as possible.  Feel free to call the clinic you have any questions or concerns. The clinic phone number is [phone].   Trastuzumab injection for infusion What is this medicine? TRASTUZUMAB (tras TOO zoo mab) is a monoclonal antibody. It targets a protein called HER2. This protein is found in some stomach and breast cancers. This medicine can stop cancer cell growth. This medicine may be used with other cancer treatments. This medicine may be used for other purposes; ask your health care provider or pharmacist if you have questions. What should I tell my health care provider before I take this medicine? They need to know if you have any of these conditions: -heart disease -heart failure -infection (especially a virus infection such as chickenpox, cold sores, or herpes) -lung or breathing disease, like asthma -recent or ongoing radiation therapy -an unusual or allergic reaction to trastuzumab, benzyl alcohol, or other medications, foods, dyes, or preservatives -pregnant or trying to get pregnant -breast-feeding How should I use this  medicine? This drug is given as an infusion into a vein. It is administered in a hospital or clinic by a specially trained health care professional. Talk to your pediatrician regarding the use of this medicine in children. This medicine is not approved for use in children. Overdosage: If you think you have taken too much of this medicine contact a poison control center or emergency room at once. NOTE: This medicine is only for you. Do not share this medicine with others. What if I miss a dose? It is important not to miss a dose. Call your doctor or health care professional if you are unable to keep an appointment. What may interact with this medicine? -cyclophosphamide -doxorubicin -warfarin This list may not describe all possible interactions. Give your health care provider a list of all the medicines, herbs, non-prescription drugs, or dietary supplements you use. Also tell them if you smoke, drink alcohol, or use illegal drugs. Some items may interact with your medicine. What should I watch for while using this medicine? Visit your doctor for checks on your progress. Report any side effects. Continue your course of treatment even though you feel ill unless your doctor tells you to stop. Call your doctor or health care professional for advice if you get a fever, chills or sore throat, or other symptoms of a cold or flu. Do not treat yourself. Try to avoid being around people who are sick. You may experience fever, chills and shaking during your first infusion. These effects are usually mild and can be treated with other medicines. Report any side effects during the infusion to your health care professional. Fever and chills usually do not  happen with later infusions. What side effects may I notice from receiving this medicine? Side effects that you should report to your doctor or other health care professional as soon as possible: -breathing difficulties -chest pain or  palpitations -cough -dizziness or fainting -fever or chills, sore throat -skin rash, itching or hives -swelling of the legs or ankles -unusually weak or tired Side effects that usually do not require medical attention (report to your doctor or other health care professional if they continue or are bothersome): -loss of appetite -headache -muscle aches -nausea This list may not describe all possible side effects. Call your doctor for medical advice about side effects. You may report side effects to FDA at 1-800-FDA-1088. Where should I keep my medicine? This drug is given in a hospital or clinic and will not be stored at home. NOTE: This sheet is a summary. It may not cover all possible information. If you have questions about this medicine, talk to your doctor, pharmacist, or health care provider.  2012, Elsevier/Gold Standard. (04/20/2009 1:43:15 PM)  Pertuzumab injection What is this medicine? PERTUZUMAB is a monoclonal antibody that targets a protein called HER2. HER2 is found in some breast cancers. This medicine can stop cancer cell growth. This medicine is used with other cancer treatments. This medicine may be used for other purposes; ask your health care provider or pharmacist if you have questions. What should I tell my health care provider before I take this medicine? They need to know if you have any of these conditions: -heart disease -heart failure -high blood pressure -history of irregular heart beat -recent or ongoing radiation therapy -an unusual or allergic reaction to pertuzumab, other medicines, foods, dyes, or preservatives -pregnant or trying to get pregnant -breast-feeding How should I use this medicine? This medicine is for infusion into a vein. It is given by a health care professional in a hospital or clinic setting. Talk to your pediatrician regarding the use of this medicine in children. Special care may be needed. Overdosage: If you think you've taken  too much of this medicine contact a poison control center or emergency room at once. Overdosage: If you think you have taken too much of this medicine contact a poison control center or emergency room at once. NOTE: This medicine is only for you. Do not share this medicine with others. What if I miss a dose? It is important not to miss your dose. Call your doctor or health care professional if you are unable to keep an appointment. What may interact with this medicine? Interactions are not expected. Give your health care provider a list of all the medicines, herbs, non-prescription drugs, or dietary supplements you use. Also tell them if you smoke, drink alcohol, or use illegal drugs. Some items may interact with your medicine. This list may not describe all possible interactions. Give your health care provider a list of all the medicines, herbs, non-prescription drugs, or dietary supplements you use. Also tell them if you smoke, drink alcohol, or use illegal drugs. Some items may interact with your medicine. What should I watch for while using this medicine? Your condition will be monitored carefully while you are receiving this medicine. Report any side effects. Continue your course of treatment even though you feel ill unless your doctor tells you to stop. Do not become pregnant while taking this medicine. Women should inform their doctor if they wish to become pregnant or think they might be pregnant. There is a potential for serious side effects  to an unborn child. Talk to your health care professional or pharmacist for more information. Do not breast-feed an infant while taking this medicine. Call your doctor or health care professional for advice if you get a fever, chills or sore throat, or other symptoms of a cold or flu. Do not treat yourself. Try to avoid being around people who are sick. You may experience fever, chills, and headache during the infusion. Report any side effects during the  infusion to your health care professional. What side effects may I notice from receiving this medicine? Side effects that you should report to your doctor or health care professional as soon as possible: -breathing problems -chest pain or palpitations -dizziness -feeling faint or lightheaded -fever or chills -skin rash, itching or hives -sore throat -swelling of the face, lips, or tongue -swelling of the legs or ankles -unusually weak or tired  Side effects that usually do not require medical attention (Report these to your doctor or health care professional if they continue or are bothersome.): -diarrhea -hair loss -nausea, vomiting -tiredness This list may not describe all possible side effects. Call your doctor for medical advice about side effects. You may report side effects to FDA at 1-800-FDA-1088. Where should I keep my medicine? This drug is given in a hospital or clinic and will not be stored at home. NOTE: This sheet is a summary. It may not cover all possible information. If you have questions about this medicine, talk to your doctor, pharmacist, or health care provider.  2013, Elsevier/Gold Standard. (12/11/2010 4:45:56 PM)

## 2012-12-30 ENCOUNTER — Telehealth: Payer: Self-pay | Admitting: Oncology

## 2012-12-30 ENCOUNTER — Ambulatory Visit: Payer: Medicare HMO

## 2012-12-30 ENCOUNTER — Encounter (HOSPITAL_COMMUNITY): Payer: Self-pay | Admitting: Pharmacy Technician

## 2012-12-30 ENCOUNTER — Telehealth: Payer: Self-pay | Admitting: *Deleted

## 2012-12-30 ENCOUNTER — Other Ambulatory Visit: Payer: Self-pay | Admitting: Radiology

## 2012-12-30 ENCOUNTER — Ambulatory Visit (HOSPITAL_BASED_OUTPATIENT_CLINIC_OR_DEPARTMENT_OTHER): Payer: Medicare HMO

## 2012-12-30 VITALS — BP 162/78 | HR 84 | Temp 98.6°F | Resp 20

## 2012-12-30 DIAGNOSIS — C787 Secondary malignant neoplasm of liver and intrahepatic bile duct: Secondary | ICD-10-CM

## 2012-12-30 DIAGNOSIS — C50911 Malignant neoplasm of unspecified site of right female breast: Secondary | ICD-10-CM

## 2012-12-30 DIAGNOSIS — C7951 Secondary malignant neoplasm of bone: Secondary | ICD-10-CM

## 2012-12-30 DIAGNOSIS — Z5111 Encounter for antineoplastic chemotherapy: Secondary | ICD-10-CM

## 2012-12-30 DIAGNOSIS — C50919 Malignant neoplasm of unspecified site of unspecified female breast: Secondary | ICD-10-CM

## 2012-12-30 LAB — CANCER ANTIGEN 27.29: CA 27.29: 831 U/mL — ABNORMAL HIGH (ref 0–39)

## 2012-12-30 MED ORDER — SODIUM CHLORIDE 0.9 % IV SOLN
Freq: Once | INTRAVENOUS | Status: AC
  Administered 2012-12-30: 09:00:00 via INTRAVENOUS

## 2012-12-30 MED ORDER — DIPHENHYDRAMINE HCL 50 MG/ML IJ SOLN
25.0000 mg | Freq: Once | INTRAMUSCULAR | Status: AC
  Administered 2012-12-30: 25 mg via INTRAVENOUS

## 2012-12-30 MED ORDER — DEXAMETHASONE SODIUM PHOSPHATE 20 MG/5ML IJ SOLN
20.0000 mg | Freq: Once | INTRAMUSCULAR | Status: AC
  Administered 2012-12-30: 20 mg via INTRAVENOUS

## 2012-12-30 MED ORDER — FAMOTIDINE IN NACL 20-0.9 MG/50ML-% IV SOLN
20.0000 mg | Freq: Once | INTRAVENOUS | Status: AC
  Administered 2012-12-30: 20 mg via INTRAVENOUS

## 2012-12-30 MED ORDER — SODIUM CHLORIDE 0.9 % IV SOLN
80.0000 mg/m2 | Freq: Once | INTRAVENOUS | Status: AC
  Administered 2012-12-30: 156 mg via INTRAVENOUS
  Filled 2012-12-30: qty 26

## 2012-12-30 MED ORDER — ONDANSETRON 8 MG/50ML IVPB (CHCC)
8.0000 mg | Freq: Once | INTRAVENOUS | Status: AC
Start: 2012-12-30 — End: 2012-12-30
  Administered 2012-12-30: 8 mg via INTRAVENOUS

## 2012-12-30 NOTE — Telephone Encounter (Signed)
Message copied by Augusto Garbe on Thu Dec 30, 2012  4:23 PM ------      Message from: Sabino Snipes      Created: Thu Dec 30, 2012 12:30 PM       1st taxol 12/30/12 ------

## 2012-12-30 NOTE — Progress Notes (Signed)
Pt tol taxol without any difficulties, IV site unremarkable.  BP went up some toward end of taxol but pt more awake at that time & no c/o's.

## 2012-12-30 NOTE — Patient Instructions (Addendum)
[facility] Discharge Instructions for Patients Receiving Chemotherapy  Today you received the following chemotherapy agents:  taxol To help prevent nausea and vomiting after your treatment, we encourage you to take your nausea medication.Paclitaxel injection What is this medicine? PACLITAXEL (PAK li TAX el) is a chemotherapy drug. It targets fast dividing cells, like cancer cells, and causes these cells to die. This medicine is used to treat ovarian cancer, breast cancer, and other cancers. This medicine may be used for other purposes; ask your health care provider or pharmacist if you have questions. What should I tell my health care provider before I take this medicine? They need to know if you have any of these conditions: -blood disorders -irregular heartbeat -infection (especially a virus infection such as chickenpox, cold sores, or herpes) -liver disease -previous or ongoing radiation therapy -an unusual or allergic reaction to paclitaxel, alcohol, polyoxyethylated castor oil, other chemotherapy agents, other medicines, foods, dyes, or preservatives -pregnant or trying to get pregnant -breast-feeding How should I use this medicine? This drug is given as an infusion into a vein. It is administered in a hospital or clinic by a specially trained health care professional. Talk to your pediatrician regarding the use of this medicine in children. Special care may be needed. Overdosage: If you think you have taken too much of this medicine contact a poison control center or emergency room at once. NOTE: This medicine is only for you. Do not share this medicine with others. What if I miss a dose? It is important not to miss your dose. Call your doctor or health care professional if you are unable to keep an appointment. What may interact with this medicine? Do not take this medicine with any of the following medications: -disulfiram -metronidazole This medicine may also  interact with the following medications: -cyclosporine -dexamethasone -diazepam -ketoconazole -medicines to increase blood counts like filgrastim, pegfilgrastim, sargramostim -other chemotherapy drugs like cisplatin, doxorubicin, epirubicin, etoposide, teniposide, vincristine -quinidine -testosterone -vaccines -verapamil Talk to your doctor or health care professional before taking any of these medicines: -acetaminophen -aspirin -ibuprofen -ketoprofen -naproxen This list may not describe all possible interactions. Give your health care provider a list of all the medicines, herbs, non-prescription drugs, or dietary supplements you use. Also tell them if you smoke, drink alcohol, or use illegal drugs. Some items may interact with your medicine. What should I watch for while using this medicine? Your condition will be monitored carefully while you are receiving this medicine. You will need important blood work done while you are taking this medicine. This drug may make you feel generally unwell. This is not uncommon, as chemotherapy can affect healthy cells as well as cancer cells. Report any side effects. Continue your course of treatment even though you feel ill unless your doctor tells you to stop. In some cases, you may be given additional medicines to help with side effects. Follow all directions for their use. Call your doctor or health care professional for advice if you get a fever, chills or sore throat, or other symptoms of a cold or flu. Do not treat yourself. This drug decreases your body's ability to fight infections. Try to avoid being around people who are sick. This medicine may increase your risk to bruise or bleed. Call your doctor or health care professional if you notice any unusual bleeding. Be careful brushing and flossing your teeth or using a toothpick because you may get an infection or bleed more easily. If you have any dental  work done, tell your dentist you are  receiving this medicine. Avoid taking products that contain aspirin, acetaminophen, ibuprofen, naproxen, or ketoprofen unless instructed by your doctor. These medicines may hide a fever. Do not become pregnant while taking this medicine. Women should inform their doctor if they wish to become pregnant or think they might be pregnant. There is a potential for serious side effects to an unborn child. Talk to your health care professional or pharmacist for more information. Do not breast-feed an infant while taking this medicine. Men are advised not to father a child while receiving this medicine. What side effects may I notice from receiving this medicine? Side effects that you should report to your doctor or health care professional as soon as possible: -allergic reactions like skin rash, itching or hives, swelling of the face, lips, or tongue -low blood counts - This drug may decrease the number of white blood cells, red blood cells and platelets. You may be at increased risk for infections and bleeding. -signs of infection - fever or chills, cough, sore throat, pain or difficulty passing urine -signs of decreased platelets or bleeding - bruising, pinpoint red spots on the skin, black, tarry stools, nosebleeds -signs of decreased red blood cells - unusually weak or tired, fainting spells, lightheadedness -breathing problems -chest pain -high or low blood pressure -mouth sores -nausea and vomiting -pain, swelling, redness or irritation at the injection site -pain, tingling, numbness in the hands or feet -slow or irregular heartbeat -swelling of the ankle, feet, hands Side effects that usually do not require medical attention (report to your doctor or health care professional if they continue or are bothersome): -bone pain -complete hair loss including hair on your head, underarms, pubic hair, eyebrows, and eyelashes -changes in the color of fingernails -diarrhea -loosening of the  fingernails -loss of appetite -muscle or joint pain -red flush to skin -sweating This list may not describe all possible side effects. Call your doctor for medical advice about side effects. You may report side effects to FDA at 1-800-FDA-1088. Where should I keep my medicine? This drug is given in a hospital or clinic and will not be stored at home. NOTE: This sheet is a summary. It may not cover all possible information. If you have questions about this medicine, talk to your doctor, pharmacist, or health care provider.  2013, [employer]vier/Gold Standard. (05/29/2008 11:54:26 AM)  If you develop nausea and vomiting that is not controlled by your nausea medication, call the clinic.   BELOW ARE SYMPTOMS THAT SHOULD BE REPORTED IMMEDIATELY:  *FEVER GREATER THAN 100.5 F  *CHILLS WITH OR WITHOUT FEVER  NAUSEA AND VOMITING THAT IS NOT CONTROLLED WITH YOUR NAUSEA MEDICATION  *UNUSUAL SHORTNESS OF BREATH  *UNUSUAL BRUISING OR BLEEDING  TENDERNESS IN MOUTH AND THROAT WITH OR WITHOUT PRESENCE OF ULCERS  *URINARY PROBLEMS  *BOWEL PROBLEMS  UNUSUAL RASH Items with * indicate a potential emergency and should be followed up as soon as possible.  Feel free to call the clinic you have any questions or concerns. The clinic phone number is [phone].

## 2012-12-30 NOTE — Telephone Encounter (Signed)
Called [name] at Home and mobile number(s).  Messge left requesting a return call for chemotherapy follow up.  Awaiting return call from patient.

## 2012-12-30 NOTE — Telephone Encounter (Signed)
Attempted to return call to [facility]. ( daughter in law) Voicemail has not been set up yet.

## 2013-01-02 ENCOUNTER — Other Ambulatory Visit: Payer: Self-pay | Admitting: Oncology

## 2013-01-03 ENCOUNTER — Other Ambulatory Visit: Payer: Self-pay | Admitting: Certified Registered Nurse Anesthetist

## 2013-01-03 ENCOUNTER — Encounter: Payer: Self-pay | Admitting: *Deleted

## 2013-01-03 ENCOUNTER — Other Ambulatory Visit: Payer: Self-pay | Admitting: Oncology

## 2013-01-03 ENCOUNTER — Encounter (HOSPITAL_COMMUNITY): Payer: Self-pay

## 2013-01-03 ENCOUNTER — Ambulatory Visit (HOSPITAL_COMMUNITY)
Admission: RE | Admit: 2013-01-03 | Discharge: 2013-01-03 | Disposition: A | Discharge status: Home / Self Care | Payer: Medicare HMO | Source: Ambulatory Visit | Attending: Oncology | Admitting: Oncology

## 2013-01-03 ENCOUNTER — Encounter (HOSPITAL_COMMUNITY)
Admission: RE | Admit: 2013-01-03 | Discharge: 2013-01-03 | Disposition: A | Discharge status: Home / Self Care | Payer: Medicare HMO | Source: Ambulatory Visit | Attending: Internal Medicine | Admitting: Internal Medicine

## 2013-01-03 DIAGNOSIS — C50919 Malignant neoplasm of unspecified site of unspecified female breast: Secondary | ICD-10-CM | POA: Insufficient documentation

## 2013-01-03 DIAGNOSIS — C7951 Secondary malignant neoplasm of bone: Secondary | ICD-10-CM | POA: Insufficient documentation

## 2013-01-03 DIAGNOSIS — E785 Hyperlipidemia, unspecified: Secondary | ICD-10-CM | POA: Insufficient documentation

## 2013-01-03 DIAGNOSIS — Z923 Personal history of irradiation: Secondary | ICD-10-CM | POA: Insufficient documentation

## 2013-01-03 DIAGNOSIS — I10 Essential (primary) hypertension: Secondary | ICD-10-CM | POA: Insufficient documentation

## 2013-01-03 DIAGNOSIS — C50912 Malignant neoplasm of unspecified site of left female breast: Secondary | ICD-10-CM

## 2013-01-03 DIAGNOSIS — Z79899 Other long term (current) drug therapy: Secondary | ICD-10-CM | POA: Insufficient documentation

## 2013-01-03 LAB — PROTIME-INR: INR: 0.99 (ref 0.00–1.49)

## 2013-01-03 LAB — CBC
HCT: 37.3 % (ref 36.0–46.0)
MCV: 87.4 fL (ref 78.0–100.0)
Platelets: 281 10*3/uL (ref 150–400)
RBC: 4.27 MIL/uL (ref 3.87–5.11)
RDW: 13.7 % (ref 11.5–15.5)
WBC: 5.8 10*3/uL (ref 4.0–10.5)

## 2013-01-03 LAB — BASIC METABOLIC PANEL
CO2: 29 mEq/L (ref 19–32)
Chloride: 101 mEq/L (ref 96–112)
Creatinine, Ser: 0.76 mg/dL (ref 0.50–1.10)
GFR calc Af Amer: >90 mL/min (ref >90)
Sodium: 139 mEq/L (ref 135–145)

## 2013-01-03 LAB — APTT: aPTT: 29 seconds (ref 24–37)

## 2013-01-03 MED ORDER — FENTANYL CITRATE 0.05 MG/ML IJ SOLN
INTRAMUSCULAR | Status: AC
  Filled 2013-01-03: qty 4

## 2013-01-03 MED ORDER — SODIUM CHLORIDE 0.9 % IV SOLN
Freq: Once | INTRAVENOUS | Status: AC
  Administered 2013-01-03: 10:00:00 via INTRAVENOUS

## 2013-01-03 MED ORDER — LIDOCAINE HCL 1 % IJ SOLN
INTRAMUSCULAR | Status: AC
  Filled 2013-01-03: qty 20

## 2013-01-03 MED ORDER — FENTANYL CITRATE 0.05 MG/ML IJ SOLN
INTRAMUSCULAR | Status: AC | PRN
  Administered 2013-01-03: 100 ug via INTRAVENOUS

## 2013-01-03 MED ORDER — HEPARIN SOD (PORK) LOCK FLUSH 100 UNIT/ML IV SOLN
500.0000 [IU] | Freq: Once | INTRAVENOUS | Status: AC
  Administered 2013-01-03: 500 [IU] via INTRAVENOUS

## 2013-01-03 MED ORDER — MIDAZOLAM HCL 2 MG/2ML IJ SOLN
INTRAMUSCULAR | Status: AC
  Filled 2013-01-03: qty 4

## 2013-01-03 MED ORDER — CEFAZOLIN SODIUM-DEXTROSE 2-3 GM-% IV SOLR
2.0000 g | Freq: Once | INTRAVENOUS | Status: AC
  Administered 2013-01-03: 2 g via INTRAVENOUS
  Filled 2013-01-03: qty 50

## 2013-01-03 MED ORDER — MIDAZOLAM HCL 2 MG/2ML IJ SOLN
INTRAMUSCULAR | Status: AC | PRN
  Administered 2013-01-03 (×2): 1 mg via INTRAVENOUS
  Administered 2013-01-03: 2 mg via INTRAVENOUS

## 2013-01-03 NOTE — Procedures (Signed)
Procedure:  Right IJ porta-cath Access:  Right IJ vein Findings:  Cath tip at cavoatrial junction.  OK to use.  No PTX.

## 2013-01-03 NOTE — H&P (Signed)
Chief Complaint: "I'm here for a port" Referring Physician:Sherrill HPI: [name] [name] is an 67 y.o. female with metastatic breast cancer who is to start chemotherapy later this week. She is scheduled for port placement. PMHx and meds reviewed. No c/o this am.  Past Medical History:  Past Medical History  Diagnosis Date  . Cancer 1996    breast  . Hyperlipidemia   . Hypertension   . Asthma     best PEF=400  . 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 Chronic low back pain     MRI 2010 showed right sided disease with paracentral  disc protusion at L5-S1 which contacts and displaces the right S1 nerve root within the lateral recess  . Migraine headache     MRI 11/18/10: Mild progression of prominent white matter type changes which may be related to a small vessel disease and / or migraine headaches.Other causes for white matter type changes such as that secondaryto; vasculitis, inflammatory process or demyelinating process feltto be secondary considerations.  . Leg swelling   . Neuralgia and neuritis   . Tenosynovitis     left, MRI 05/2003  . Herniated disc     h/o ruptured disk, laminectomy L4-L5, Dr. Montez Morita  . Breast cancer 1996, 2001    f/b Dr. Myrle Sheng. s/p masectomy +radation. w/ metastatic disease, now on Femara.  . Tobacco abuse   . Hx of radiation therapy 06/21/02 -07/13/02    T1-T6  . Bone metastases     breast primary, T spine  . Shortness of breath     exertion  . Hx of migraines   . Hx of radiation therapy 04/21/12    T3-T4 spinal SRS  . Breast cancer metastasized to multiple sites 07/12/2012    Past Surgical History:  Past Surgical History  Procedure Laterality Date  . Abdominal hysterectomy    . Cesarean section    . Mastectomy      left  . Laminectomy      L4-L5  . Joint replacement      Left Knee  . Back surgery    . Breast surgery    . Amputation  11/26/2011     Procedure: AMPUTATION DIGIT;  Surgeon: Kennieth Rad, MD;  Location: [facility] OR;  Service: Orthopedics;  Laterality: Bilateral;  PHALANGECTOMY  - Right fifth toe and left third toe  . Rotator cuff repair      pt does not remember year of surgery  . Thoracic laminectomy  04/01/12    T 2-4-metastatic tumor resection    Family History:  Family History  Problem Relation Age of Onset  . Anesthesia problems Neg Hx   . Hypotension Neg Hx   . Malignant hyperthermia Neg Hx   . Pseudochol deficiency Neg Hx     Social History:  reports that she has been smoking Cigarettes.  She has a 25 pack-year smoking history. She has never used smokeless tobacco. She reports that she uses illicit drugs (Oxycodone). She reports that she does not drink alcohol.  Allergies:  Allergies  Allergen Reactions  . Banana     Abdominal pain      Medications: albuterol (PROVENTIL HFA;VENTOLIN HFA) 108 (90 BASE) MCG/ACT inhaler (Taking) 18 g 5 09/21/2012 09/21/2013 Sig - Route: Inhale 2 puffs into the lungs every 6 (six) hours as needed. Shortness of breath - Inhalation Class:  Print Number of times this order has been changed since signing: 1 Order [address] dexamethasone (DECADRON) 4 MG tablet (Taking) 12/28/2012 Sig - Route: Take 4 mg by mouth 2 (two) times daily with a meal. - Oral Class: Historical Med morphine (MSIR) 15 MG tablet (Taking) 12/21/2012 Sig - Route: Take 15-30 mg by mouth every 4 (four) hours as needed for pain. - Oral Class: Historical Med prochlorperazine (COMPAZINE) 10 MG tablet (Taking) 12/29/2012 Sig - Route: Take 10 mg by mouth every 6 (six) hours as needed. Nausea - Oral Class: Historical Med SUMAtriptan (IMITREX) 50 MG tablet (Taking) Sig - Route: Take 50 mg by mouth every 2 (two) hours as needed for migraine   Please HPI for pertinent positives, otherwise complete 10 system ROS negative.  Physical Exam: Temp: 97.9, HR: 78, RR: 20, BP: 160/77   General Appearance:  Alert, cooperative, no distress,  appears stated age  Head:  Normocephalic, without obvious abnormality, atraumatic  ENT: Unremarkable  Neck: Supple, symmetrical, trachea midline  Lungs:   Clear to auscultation bilaterally, no w/r/r, respirations unlabored without use of accessory muscles.  Chest Wall:  No tenderness or deformity  Heart:  Regular rate and rhythm, S1, S2 normal, no murmur, rub or gallop.  Neurologic: Normal affect, no gross deficits.   Results for orders placed during the hospital encounter of 01/03/13 (from the past 48 hour(s))  APTT     Status: None   Collection Time    01/03/13  9:50 AM      Result Value Range   aPTT 29  24 - 37 seconds  BASIC METABOLIC PANEL     Status: Abnormal   Collection Time    01/03/13  9:50 AM      Result Value Range   Sodium 139  135 - 145 mEq/L   Potassium 3.4 (*) 3.5 - 5.1 mEq/L   Chloride 101  96 - 112 mEq/L   CO2 29  19 - 32 mEq/L   Glucose, Bld 115 (*) 70 - 99 mg/dL   BUN 15  6 - 23 mg/dL   Creatinine, Ser 1.61  0.50 - 1.10 mg/dL   Calcium 9.5  8.4 - 09.6 mg/dL   GFR calc non Af Amer 84 (*) >90 mL/min   GFR calc Af Amer >90  >90 mL/min   Comment:            The eGFR has been calculated     using the CKD EPI equation.     This calculation has not been     validated in all clinical     situations.     eGFR's persistently     <90 mL/min signify     possible Chronic Kidney Disease.  CBC     Status: None   Collection Time    01/03/13  9:50 AM      Result Value Range   WBC 5.8  4.0 - 10.5 K/uL   RBC 4.27  3.87 - 5.11 MIL/uL   Hemoglobin 12.3  12.0 - 15.0 g/dL   HCT 04.5  40.9 - 81.1 %   MCV 87.4  78.0 - 100.0 fL   MCH 28.8  26.0 - 34.0 pg   MCHC 33.0  30.0 - 36.0 g/dL   RDW 91.4  78.2 - 95.6 %   Platelets 281  150 - 400 K/uL  PROTIME-INR     Status: None   Collection Time    01/03/13  9:50 AM  Result Value Range   Prothrombin Time 12.9  11.6 - 15.2 seconds   INR 0.99  0.00 - 1.49   No results found.  Assessment/Plan Metastatic breast  cancer For Port placement today Discussed procedure, risks, complications, use of sedation Labs reviewed, ok Consent signed in chart  Brayton El PA-C 01/03/2013, 11:16 AM

## 2013-01-03 NOTE — H&P (Signed)
Agree 

## 2013-01-03 NOTE — Progress Notes (Signed)
CSW registered patient for [facility] transportation.  Patient is aware and will utilize [facility] for all medical appointments.  Kathrin Penner, MSW, LCSW Clinical Social Worker [facility] [phone]

## 2013-01-05 ENCOUNTER — Ambulatory Visit (HOSPITAL_BASED_OUTPATIENT_CLINIC_OR_DEPARTMENT_OTHER): Payer: Medicare HMO

## 2013-01-05 VITALS — BP 145/84 | HR 91 | Temp 97.6°F | Resp 20

## 2013-01-05 DIAGNOSIS — Z5111 Encounter for antineoplastic chemotherapy: Secondary | ICD-10-CM

## 2013-01-05 DIAGNOSIS — C50912 Malignant neoplasm of unspecified site of left female breast: Secondary | ICD-10-CM

## 2013-01-05 DIAGNOSIS — C787 Secondary malignant neoplasm of liver and intrahepatic bile duct: Secondary | ICD-10-CM

## 2013-01-05 DIAGNOSIS — C50919 Malignant neoplasm of unspecified site of unspecified female breast: Secondary | ICD-10-CM

## 2013-01-05 DIAGNOSIS — C7951 Secondary malignant neoplasm of bone: Secondary | ICD-10-CM

## 2013-01-05 MED ORDER — SODIUM CHLORIDE 0.9 % IV SOLN
80.0000 mg/m2 | Freq: Once | INTRAVENOUS | Status: AC
  Administered 2013-01-05: 156 mg via INTRAVENOUS
  Filled 2013-01-05: qty 26

## 2013-01-05 MED ORDER — HEPARIN SOD (PORK) LOCK FLUSH 100 UNIT/ML IV SOLN
500.0000 [IU] | Freq: Once | INTRAVENOUS | Status: AC | PRN
  Administered 2013-01-05: 500 [IU]
  Filled 2013-01-05: qty 5

## 2013-01-05 MED ORDER — FAMOTIDINE IN NACL 20-0.9 MG/50ML-% IV SOLN
20.0000 mg | Freq: Once | INTRAVENOUS | Status: AC
  Administered 2013-01-05: 20 mg via INTRAVENOUS

## 2013-01-05 MED ORDER — ONDANSETRON 8 MG/50ML IVPB (CHCC)
8.0000 mg | Freq: Once | INTRAVENOUS | Status: AC
  Administered 2013-01-05: 8 mg via INTRAVENOUS

## 2013-01-05 MED ORDER — DIPHENHYDRAMINE HCL 50 MG/ML IJ SOLN
25.0000 mg | Freq: Once | INTRAMUSCULAR | Status: AC
  Administered 2013-01-05: 25 mg via INTRAVENOUS

## 2013-01-05 MED ORDER — SODIUM CHLORIDE 0.9 % IV SOLN
Freq: Once | INTRAVENOUS | Status: AC
  Administered 2013-01-05: 10:00:00 via INTRAVENOUS

## 2013-01-05 MED ORDER — SODIUM CHLORIDE 0.9 % IJ SOLN
10.0000 mL | INTRAMUSCULAR | Status: DC | PRN
  Administered 2013-01-05: 10 mL
  Filled 2013-01-05: qty 10

## 2013-01-05 MED ORDER — DEXAMETHASONE SODIUM PHOSPHATE 10 MG/ML IJ SOLN
10.0000 mg | Freq: Once | INTRAMUSCULAR | Status: AC
  Administered 2013-01-05: 10 mg via INTRAVENOUS

## 2013-01-05 NOTE — Patient Instructions (Addendum)
[facility] Discharge Instructions for Patients Receiving Chemotherapy  Today you received the following chemotherapy agents Taxol  To help prevent nausea and vomiting after your treatment, we encourage you to take your nausea medication    If you develop nausea and vomiting that is not controlled by your nausea medication, call the clinic.   BELOW ARE SYMPTOMS THAT SHOULD BE REPORTED IMMEDIATELY:  *FEVER GREATER THAN 100.5 F  *CHILLS WITH OR WITHOUT FEVER  NAUSEA AND VOMITING THAT IS NOT CONTROLLED WITH YOUR NAUSEA MEDICATION  *UNUSUAL SHORTNESS OF BREATH  *UNUSUAL BRUISING OR BLEEDING  TENDERNESS IN MOUTH AND THROAT WITH OR WITHOUT PRESENCE OF ULCERS  *URINARY PROBLEMS  *BOWEL PROBLEMS  UNUSUAL RASH Items with * indicate a potential emergency and should be followed up as soon as possible.  Feel free to call the clinic you have any questions or concerns. The clinic phone number is [phone].    

## 2013-01-07 ENCOUNTER — Ambulatory Visit: Payer: Medicare HMO | Admitting: Nurse Practitioner

## 2013-01-07 ENCOUNTER — Ambulatory Visit: Payer: Medicare HMO

## 2013-01-07 ENCOUNTER — Other Ambulatory Visit: Payer: Medicare HMO | Admitting: Lab

## 2013-01-09 ENCOUNTER — Other Ambulatory Visit: Payer: Self-pay | Admitting: Oncology

## 2013-01-12 ENCOUNTER — Telehealth: Payer: Self-pay | Admitting: Dietician

## 2013-01-12 NOTE — Telephone Encounter (Signed)
Brief Outpatient Oncology Nutrition Note  Patient has been identified to be at risk on malnutrition screen.  Wt Readings from Last 10 Encounters:  12/21/12 178 lb 12.8 oz (81.103 kg)  11/04/12 185 lb (83.915 kg)  10/14/12 189 lb 6.4 oz (85.911 kg)  09/21/12 193 lb 6.4 oz (87.726 kg)  09/06/12 193 lb 1.6 oz (87.59 kg)  07/26/12 198 lb 12.8 oz (90.175 kg)  07/12/12 204 lb (92.534 kg)  06/08/12 202 lb 8 oz (91.853 kg)  05/31/12 204 lb (92.534 kg)  05/25/12 205 lb 11.2 oz (93.305 kg)    Called patient due to continued weight loss.  Patient with metastatic breast cancer on chemo.    Patent states that she has had increasing problems with nausea, vomiting, and diarrhea.  The nausea medicine is not working and that she spoke with her son and has decided to stop the chemo.  Patient states that she has an appointment with MD tomorrow and will discuss with him at that time.  Not able to tolerate solid foods with N/V/D but tolerating clear water/ice.  Discussed importance of maintaining hydration and patient states that she is aware and has been working on this.  Contact information for [facility] provided.    Oran Rein, RD, LDN

## 2013-01-13 ENCOUNTER — Telehealth: Payer: Self-pay | Admitting: *Deleted

## 2013-01-13 ENCOUNTER — Other Ambulatory Visit (HOSPITAL_BASED_OUTPATIENT_CLINIC_OR_DEPARTMENT_OTHER): Payer: Commercial Managed Care - HMO | Admitting: Lab

## 2013-01-13 ENCOUNTER — Ambulatory Visit (HOSPITAL_BASED_OUTPATIENT_CLINIC_OR_DEPARTMENT_OTHER): Payer: Medicare HMO | Admitting: Nurse Practitioner

## 2013-01-13 ENCOUNTER — Ambulatory Visit: Payer: Medicare HMO

## 2013-01-13 ENCOUNTER — Telehealth: Payer: Self-pay | Admitting: Oncology

## 2013-01-13 VITALS — BP 140/78 | HR 85 | Temp 98.2°F | Resp 20 | Ht 68.0 in | Wt 169.6 lb

## 2013-01-13 DIAGNOSIS — C50919 Malignant neoplasm of unspecified site of unspecified female breast: Secondary | ICD-10-CM

## 2013-01-13 DIAGNOSIS — C801 Malignant (primary) neoplasm, unspecified: Secondary | ICD-10-CM

## 2013-01-13 DIAGNOSIS — C800 Disseminated malignant neoplasm, unspecified: Secondary | ICD-10-CM

## 2013-01-13 DIAGNOSIS — C7951 Secondary malignant neoplasm of bone: Secondary | ICD-10-CM

## 2013-01-13 LAB — COMPREHENSIVE METABOLIC PANEL (CC13)
AST: 10 U/L (ref 5–34)
Alkaline Phosphatase: 73 U/L (ref 40–150)
BUN: 14.4 mg/dL (ref 7.0–26.0)
Creatinine: 0.8 mg/dL (ref 0.6–1.1)
Total Bilirubin: 0.34 mg/dL (ref 0.20–1.20)

## 2013-01-13 LAB — CBC WITH DIFFERENTIAL/PLATELET
Basophils Absolute: 0 10*3/uL (ref 0.0–0.1)
EOS%: 0.3 % (ref 0.0–7.0)
HCT: 35.6 % (ref 34.8–46.6)
MCHC: 33 g/dL (ref 31.5–36.0)
MONO%: 12.3 % (ref 0.0–14.0)
NEUT#: 2.4 10*3/uL (ref 1.5–6.5)
NEUT%: 60.3 % (ref 38.4–76.8)
Platelets: 233 10*3/uL (ref 145–400)
RBC: 3.99 10*6/uL (ref 3.70–5.45)
RDW: 14.5 % (ref 11.2–14.5)
WBC: 4 10*3/uL (ref 3.9–10.3)

## 2013-01-13 MED ORDER — ONDANSETRON HCL 8 MG PO TABS: 8.0000 mg | ORAL_TABLET | Freq: Three times a day (TID) | ORAL | Status: DC | PRN

## 2013-01-13 NOTE — Telephone Encounter (Signed)
gv and printed appt sched and avs forlpt...emailed MW to add tx....  °

## 2013-01-13 NOTE — Progress Notes (Addendum)
OFFICE PROGRESS NOTE  Interval history:  [name] [name] returns as scheduled. She began cycle 1 Taxol/Herceptin/Pertuzumab on 12/29/2012. The Taxol was given on 12/30/2012. The day 8 Taxol was given on 01/05/2013.  She reports persistent nausea/vomiting and diarrhea since beginning treatment. She is taking Compazine without improvement. She is vomiting multiple times a day. She estimates 2 loose stools a day. She is losing weight. She denies any numbness or tingling in her hands or feet. Back pain overall is better. She is currently on dexamethasone 4 mg twice daily. She has morphine for as needed use. She has been unable to take the morphine due to nausea. No headaches or vision change. No bowel or bladder dysfunction.   Objective: Blood pressure 140/78, pulse 85, temperature 98.2 F (36.8 C), temperature source Oral, resp. rate 20, height 5\' 8"  (1.727 m), weight 169 lb 9.6 oz (76.93 kg).  No thrush. Tiny left supraclavicular lymph node. No palpable axillary lymph nodes. 2 cm superficially ulcerated bleeding lesion at the left mastectomy scar. Lungs clear. Regular cardiac rhythm. Port-A-Cath site without erythema. Abdomen soft and nontender. No organomegaly. Extremities without edema.  Lab Results: Lab Results  Component Value Date   WBC 4.0 01/13/2013   HGB 11.8 01/13/2013   HCT 35.6 01/13/2013   MCV 89.3 01/13/2013   PLT 233 01/13/2013    Chemistry:    Chemistry      Component Value Date/Time   NA 138 01/13/2013 1006   NA 139 01/03/2013 0950   K 3.5 01/13/2013 1006   K 3.4* 01/03/2013 0950   CL 101 01/03/2013 0950   CL 107 10/14/2012 1047   CO2 26 01/13/2013 1006   CO2 29 01/03/2013 0950   BUN 14.4 01/13/2013 1006   BUN 15 01/03/2013 0950   CREATININE 0.8 01/13/2013 1006   CREATININE 0.76 01/03/2013 0950   CREATININE 0.89 10/23/2010 1514      Component Value Date/Time   CALCIUM 9.4 01/13/2013 1006   CALCIUM 9.5 01/03/2013 0950   ALKPHOS 73 01/13/2013 1006   ALKPHOS 86 10/23/2010 1514   AST 10  01/13/2013 1006   AST 11 10/23/2010 1514   ALT 9 01/13/2013 1006   ALT 8 10/23/2010 1514   BILITOT 0.34 01/13/2013 1006   BILITOT 0.3 10/23/2010 1514       Studies/Results: Ct Thoracic Spine Wo Contrast  12/24/2012   *RADIOLOGY REPORT*  Clinical Data: Back pain.  History of upper thoracic fusion.  CT THORACIC SPINE WITHOUT CONTRAST  Technique:  Multidetector CT imaging of the thoracic spine was performed without intravenous contrast administration. Multiplanar CT image reconstructions were also generated  Comparison: CT scan 03/16/2012 and MRI 03/05/2012.  Findings: There are pedicle screws and posterior rods extending from T1-T6.  Severe osseous metastatic disease involving T3 and T4. The T3 vertebral body demonstrates significant compression, progressive since the prior CT scan.  There is mild retropulsion involving the posterior aspect of the vertebral body but there is a wide decompressive laminectomy without evidence of spinal stenosis. There is mild lucency surrounding the pedicle screws and T1, T2, T5 and T6 which may suggest loosening.  There are multiple other metastatic bone lesions scattered throughout the thoracic spine but I do not see any new pathologic compression fractures.  There is a progressive metastatic disease noted in the posterior aspect of the T5 vertebral body which may surround the right pedicle screw.  Destructive rib lesions are also noted.  Large mediastinal mass/adenopathy appears progressive since prior MRI.  There is also  a small left pleural effusion and overlying atelectasis.  IMPRESSION:  1.  Further pathologic compression of the T3 vertebral body and slightly progressive pathologic compression of T4.  Retropulsion at T3 but no significant spinal stenosis due to a wide decompressive laminectomy. 2.  Progressive metastatic disease involving the thoracic spine and ribs. 3.  No new compression fractures. 4.  Mild lucency surrounding the pedicle screws and T1, T2, T5 and T6  which may suggest loosening. 5.  Progressive mediastinal disease.   Original Report Authenticated By: Rudie Meyer, M.D.   Mr Thoracic Spine W Wo Contrast  12/26/2012   *RADIOLOGY REPORT*  Clinical Data: Progressive back pain for 1 month.  History of breast cancer with osseous metastases and spinal fusion.  MRI THORACIC SPINE WITHOUT AND WITH CONTRAST  Technique:  Multiplanar and multiecho pulse sequences of the thoracic spine were obtained without and with intravenous contrast.  Contrast: 17mL MULTIHANCE GADOBENATE DIMEGLUMINE 529 MG/ML IV SOLN  Comparison: Thoracic spine CT 12/24/2012.  MRI 04/19/2012.  Findings: Sagittal localizing images through the cervical spine demonstrate no definite osseous metastases or pathologic fractures in the cervical spine.  Patient is status post T1-T6 pedicle screw and rod fusion. T3 laminectomies have been performed.  The hardware appears grossly stable, although did demonstrate some loosening on the recent CT.  As demonstrated on the recent CT, there are progressive pathologic fractures at T3 and T4 with associated enhancing epidural tumor on the right extending into the right T2-T3 foramen.  There is mild right-sided cord compression with narrowing of the AP diameter of the canal to approximately 5 mm at T3.  There is progressive osseous metastatic disease posteriorly in the T5 vertebral body, the T8 spinous process and in several ribs.  No other pathologic fractures are identified.  There is multi metastatic disease within the left pleural space and mediastinum.  A small left pleural effusion is present.  IMPRESSION:  1.  As demonstrated on the recent thoracic spine CT, there is progressive metastatic disease to the spine with increased pathologic fractures at T3 and T4. Despite the previous T3 laminectomies, there is early cord compression at T3 and right- sided neural foraminal extension of epidural tumor at T2-T3. 2.  Bulky extra osseous metastatic disease is again  demonstrated within the mediastinum and left pleural space.  Critical Value/emergent results were called by telephone at the time of interpretation on 12/02/2012 at 1450 hours to Dr. Darnelle Catalan, who verbally acknowledged these results.   Original Report Authenticated By: Carey Bullocks, M.D.   Ir Fluoro Guide Cv Line Right  01/03/2013   *RADIOLOGY REPORT*  Clinical Data: Metastatic breast carcinoma and need for Port-A-Cath for chemotherapy.  IMPLANTED PORT A CATH PLACEMENT WITH ULTRASOUND AND FLUOROSCOPIC GUIDANCE  Sedation:  4.0 mg IV Versed; 100 mcg IV Fentanyl.  Total Moderate Sedation Time:  45 minutes.  Additional Medications:  2 grams IV Ancef.  As antibiotic prophylaxis, Ancef was ordered pre-procedure and administered intravenously within one hour of incision.  Fluoroscopy Time:  24 seconds.  Procedure:  The procedure, risks, benefits, and alternatives were explained to the patient.  Questions regarding the procedure were encouraged and answered.  The patient understands and consents to the procedure.  The right neck and chest were prepped with chlorhexidine in a sterile fashion, and a sterile drape was applied covering the operative field.  Maximum barrier sterile technique with sterile gowns and gloves were used for the procedure.  Local anesthesia was provided with 1% lidocaine and lidocaine with epinephrine.  After creating a small venotomy incision, a 21 gauge needle was advanced into the right internal jugular vein under direct, real- time ultrasound guidance.  Ultrasound image documentation was performed.  After securing guidewire access, an 8 Fr dilator was placed.  A J-wire was kinked to measure appropriate catheter length.  A subcutaneous port pocket was then created along the upper chest wall utilizing sharp and blunt dissection.  Portable cautery was utilized.  The pocket was irrigated with sterile saline.  A single lumen power injectable port was chosen for placement.  The 8 Fr catheter was  tunneled from the port pocket site to the venotomy incision.  The port was placed in the pocket.  External catheter was trimmed to appropriate length based on guidewire measurement.  At the venotomy, an 8 Fr peel-away sheath was placed over a guidewire.  The catheter was then placed through the sheath and the sheath removed.  Final catheter positioning was confirmed and documented with a fluoroscopic spot image.  The port was accessed with a needle and aspirated and flushed with heparinized saline. The needle was removed.  The venotomy and port pocket incisions were closed with subcutaneous 3-0 Monocryl and subcuticular 4-0 Vicryl.  Dermabond was applied to both incisions.  Complications: None.  No pneumothorax.  Findings:  After catheter placement, the tip lies at the cavoatrial junction.  The catheter aspirates normally and is ready for immediate use.  IMPRESSION:  Placement of single lumen port a cath via right internal jugular vein.  The catheter tip lies at the cavoatrial junction.  A power injectable port a cath was placed and is ready for immediate use.   Original Report Authenticated By: Irish Lack, M.D.   Ir US Guide Vasc Access Right  01/03/2013   *RADIOLOGY REPORT*  Clinical Data: Metastatic breast carcinoma and need for Port-A-Cath for chemotherapy.  IMPLANTED PORT A CATH PLACEMENT WITH ULTRASOUND AND FLUOROSCOPIC GUIDANCE  Sedation:  4.0 mg IV Versed; 100 mcg IV Fentanyl.  Total Moderate Sedation Time:  45 minutes.  Additional Medications:  2 grams IV Ancef.  As antibiotic prophylaxis, Ancef was ordered pre-procedure and administered intravenously within one hour of incision.  Fluoroscopy Time:  24 seconds.  Procedure:  The procedure, risks, benefits, and alternatives were explained to the patient.  Questions regarding the procedure were encouraged and answered.  The patient understands and consents to the procedure.  The right neck and chest were prepped with chlorhexidine in a sterile fashion,  and a sterile drape was applied covering the operative field.  Maximum barrier sterile technique with sterile gowns and gloves were used for the procedure.  Local anesthesia was provided with 1% lidocaine and lidocaine with epinephrine.  After creating a small venotomy incision, a 21 gauge needle was advanced into the right internal jugular vein under direct, real- time ultrasound guidance.  Ultrasound image documentation was performed.  After securing guidewire access, an 8 Fr dilator was placed.  A J-wire was kinked to measure appropriate catheter length.  A subcutaneous port pocket was then created along the upper chest wall utilizing sharp and blunt dissection.  Portable cautery was utilized.  The pocket was irrigated with sterile saline.  A single lumen power injectable port was chosen for placement.  The 8 Fr catheter was tunneled from the port pocket site to the venotomy incision.  The port was placed in the pocket.  External catheter was trimmed to appropriate length based on guidewire measurement.  At the venotomy, an 8 Fr peel-away sheath  was placed over a guidewire.  The catheter was then placed through the sheath and the sheath removed.  Final catheter positioning was confirmed and documented with a fluoroscopic spot image.  The port was accessed with a needle and aspirated and flushed with heparinized saline. The needle was removed.  The venotomy and port pocket incisions were closed with subcutaneous 3-0 Monocryl and subcuticular 4-0 Vicryl.  Dermabond was applied to both incisions.  Complications: None.  No pneumothorax.  Findings:  After catheter placement, the tip lies at the cavoatrial junction.  The catheter aspirates normally and is ready for immediate use.  IMPRESSION:  Placement of single lumen port a cath via right internal jugular vein.  The catheter tip lies at the cavoatrial junction.  A power injectable port a cath was placed and is ready for immediate use.   Original Report Authenticated  By: Irish Lack, M.D.    Medications: I have reviewed the patient's current medications.  Assessment/Plan:  1.Metastatic breast cancer - initially diagnosed with breast cancer in 1996 status post left mastectomy and axillary lymph node dissection. She developed a chest wall recurrence in 2001 and completed radiation. In April 2003, CT showed mediastinal, internal mammary, and right axillary adenopathy and two liver lesions. MRI of the thoracic spine June 18, 2002 revealed osseous abnormalities at T3 and T4 suspicious for metastatic disease with extension into the T3 pedicle. There was extra osseous extension compressing and displacing the thoracic cord. She completed radiation from T1 through T6 June 21, 2002 through July 13, 2002. CT scan Nov 11, 2002 showed progression of metastatic disease with precardiac nodes, left pleural and lower lobe disease, subcutaneous nodules at the left anterior chest wall and progressive metastatic disease involving the liver. She began Femara in May/early June 2004 with subsequent resolution of chest wall nodules. CT scans January 14, 2005, of the chest and abdomen revealed findings of decreased pleural-based metastatic disease in the lower left hemithorax and no new or progressive disease in the chest. Small lesion in the right liver measuring 13.0 x 10.0 mm was reported as stable when compared to her previous study. No other liver lesions were seen. Bone scan January 22, 2006 showed degenerative changes. She discontinued Femara February 2009 and was lost a followup. She presented in March 2010 with an ulcerated chest wall lesion and an elevated CA27.29. Restaging CTs September 18, 2008 showed a 1.3 x 2.2 cm subdermal lesion at the left anterior chest, multiple enlarged metastases in the left hemithorax at the mediastinum and pleura felt to represent necrotic lymph nodes versus pleural metastases. Right axillary lymph node was decreased in size compared to 2006. There  were no suspicious bone lesions. A previously noted lesion in the inferior right liver was not clearly seen. She resumed Femara March 2010. On October 23, 2008 the chest wall lesion had improved. The CA27.29 on October 23, 2008 was stable at 60. She discontinued Femara in June 2010 and CTs of the abdomen and pelvis April 06, 2009 showed slight decrease in the size of a pleural-based nodule in the left lower lobe. There was no other evidence for metastatic disease in the abdomen or pelvis. She resumed Femara on April 11, 2009. CA27.29 returned at 87 on April 11, 2009. CT of the abdomen and pelvis on May 30, 2009 showed an increase in multiple pericardial masses, increased left chest wall subcutaneous nodule, stable pleural-based metastases and stable liver lesions. She discontinued Femara in November 2010 due to nausea and vomiting. Femara was  resumed following an office visit June 06, 2009. Dr. Myrle Sheng reviewed a CT from May 30, 2009 with a [name] [name] radiologist comparing the scan to scans from March 2010 and October 2010. In addition, a CT scan from 2006 was reviewed. The pleural-based lesions in the left chest had not changed significantly since March 2010. The pericardial masses appeared slightly larger. Both the pericardial masses and pleural-based masses were larger compared to the CT from 2006. She discontinued Femara January 2011. The left chest wall nodule recurred during the time she was off of Femara. Femara was resumed following an office visit 10/02/2009. The nodule again improved. Restaging CT of the chest 08/16/2010 was stable compared to a CT from 03/29/2010. Most recent CA 27-29 on 01/16/2012 was stable at 340. Femara was continued. CT the chest 02/23/2012 with progressive pleural-based tumor in the left chest with probable involvement of T3. Femara was discontinued. MRI of the thoracic spine on 03/05/2012 showed severe T3 and T4 metastatic disease with mild pathologic compression  fractures and epidural extension causing some cord compression at T3. There was no definite cord edema. There was associated extensive bilateral neural foraminal involvement at the T2 and T3 nerve levels greater on the left. There was associated very bulky mediastinal and pleural metastatic disease. She underwent a laminectomy procedure on 04/01/2012 with resection of epidural metastatic tumor. 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 The tumor was ER positive 100%; PR positive 53%; HER-2/neu by CISH showed amplification. She declined a trial of chemotherapy and Herceptin. She began Faslodex on 07/12/2012. The CA 27-29 tumor marker was further increased on 10/14/2012. MRI thoracic spine 12/26/2012 showed progressive metastatic disease to the spine with increased pathologic fractures at T3 and T4. Despite the previous T3 laminectomies there was early cord compression at T3 and right-sided neural foraminal extension of epidural tumor at T2-T3. Bulky extra osseous metastatic disease was again demonstrated within the mediastinum and left pleural space. She began treatment with Taxol/Herceptin/Pertuzumab with the Taxol given on a day 1/day 8 schedule and the Herceptin/Pertuzumab given day 1 only of a 21 day cycle on 12/29/2012. 2. Ulcerated left chest lesion, likely a metastatic lesion.  3. Severe right low back/buttock pain radiating to the left leg October 2010 with MRI on April 06, 2009 revealing a disk protrusion at L5-S1 displacing the right S1 nerve root. She was evaluated by Dr. Jordan Likes. The pain has resolved.  4. Hospitalization December 1 through May 31, 2009 with nausea, vomiting and abdominal pain - resolved.  5. 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  6. Pain at the right back/ iliac region October 25, 2008.  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  8. Status post left knee replacement.   9. Chronic edema of the left lower leg.  10. "Migraines". She takes Imitrex as needed.  11. Falls and balance problems occurring over the past year. She has been referred to neurology. No difficulty with ambulation today.  12. Right breast with area of firmness on exam March 2013 -negative right breast mammogram and ultrasound 09/15/2011 . Negative right breast mammogram 09/16/2012.  13. Pain at upper back. Resolved following the laminectomy procedure. The pain increased. CT and MRI confirmed progressive disease.improved 14. Unsteady gait on 04/14/2012-much improved.  15. Nausea/vomiting following Faslodex 07/26/2012.  16. Weight loss-likely secondary to progression of the metastatic breast cancer. She continues to lose weight. 17. Persistent nausea and diarrhea following initiation of Taxol/Herceptin/pertuzumab  12/29/2012. Question etiology.  Disposition-[name] [name] has completed one cycle of Taxol/Herceptin/Pertuzumab. Back pain is better. She will decrease dexamethasone to 4 mg daily.   She has persistent nausea and diarrhea. The etiology is unclear. A prescription was sent to her pharmacy for Zofran 8 mg every 8 hours as needed. We will adjust the premedication regimen to include Aloxi beginning with cycle 2.  She will return to begin cycle 2 on 01/19/2013. We will see her in followup prior to beginning cycle 3 on 02/09/2013. We will obtain a repeat CA 27-29 on 02/09/2013.  She understands to contact the office prior to her next visit with problems. We specifically discussed persistent nausea and diarrhea.  Patient seen with Dr. Truett Perna.   Lonna Cobb ANP/GNP-BC   She has completed one cycle of Taxol/trastuzumab/pertuzumab. The back pain is significantly improved. She complains of nausea which is an unusual toxicity with this regimen. We will adjust antiemetic regimen beginning with cycle 2. She was given a prescription for Zofran today. She appears much more comfortable today. She will  contact us for persistent nausea.  [name] [name] is scheduled to begin a second cycle of systemic therapy on 01/19/2013. She will return for an office visit on 02/09/2013.  I discussed the current plan with her son.  Mancel Bale, M.D.

## 2013-01-13 NOTE — Telephone Encounter (Signed)
Per staff message and POF I have scheduled appts.  [name]  

## 2013-01-19 ENCOUNTER — Ambulatory Visit (HOSPITAL_BASED_OUTPATIENT_CLINIC_OR_DEPARTMENT_OTHER): Payer: Commercial Managed Care - HMO | Admitting: Oncology

## 2013-01-19 ENCOUNTER — Ambulatory Visit (HOSPITAL_COMMUNITY)
Admission: RE | Admit: 2013-01-19 | Discharge: 2013-01-19 | Disposition: A | Discharge status: Home / Self Care | Payer: Medicare HMO | Source: Ambulatory Visit | Attending: Nurse Practitioner | Admitting: Nurse Practitioner

## 2013-01-19 ENCOUNTER — Ambulatory Visit (HOSPITAL_BASED_OUTPATIENT_CLINIC_OR_DEPARTMENT_OTHER): Payer: Commercial Managed Care - HMO

## 2013-01-19 ENCOUNTER — Other Ambulatory Visit: Payer: Self-pay | Admitting: Oncology

## 2013-01-19 ENCOUNTER — Telehealth: Payer: Self-pay | Admitting: Internal Medicine

## 2013-01-19 ENCOUNTER — Other Ambulatory Visit: Payer: Self-pay | Admitting: *Deleted

## 2013-01-19 ENCOUNTER — Telehealth: Payer: Self-pay | Admitting: Oncology

## 2013-01-19 ENCOUNTER — Other Ambulatory Visit: Payer: Self-pay | Admitting: Radiation Therapy

## 2013-01-19 ENCOUNTER — Other Ambulatory Visit: Payer: Self-pay | Admitting: Nurse Practitioner

## 2013-01-19 ENCOUNTER — Other Ambulatory Visit (HOSPITAL_BASED_OUTPATIENT_CLINIC_OR_DEPARTMENT_OTHER): Payer: Commercial Managed Care - HMO | Admitting: Lab

## 2013-01-19 VITALS — BP 125/63 | HR 88 | Temp 97.6°F | Resp 18

## 2013-01-19 DIAGNOSIS — C7949 Secondary malignant neoplasm of other parts of nervous system: Secondary | ICD-10-CM

## 2013-01-19 DIAGNOSIS — C50919 Malignant neoplasm of unspecified site of unspecified female breast: Secondary | ICD-10-CM

## 2013-01-19 DIAGNOSIS — C7931 Secondary malignant neoplasm of brain: Secondary | ICD-10-CM

## 2013-01-19 DIAGNOSIS — J3489 Other specified disorders of nose and nasal sinuses: Secondary | ICD-10-CM | POA: Insufficient documentation

## 2013-01-19 DIAGNOSIS — R112 Nausea with vomiting, unspecified: Secondary | ICD-10-CM

## 2013-01-19 DIAGNOSIS — C50912 Malignant neoplasm of unspecified site of left female breast: Secondary | ICD-10-CM

## 2013-01-19 DIAGNOSIS — C7951 Secondary malignant neoplasm of bone: Secondary | ICD-10-CM

## 2013-01-19 DIAGNOSIS — C7952 Secondary malignant neoplasm of bone marrow: Secondary | ICD-10-CM

## 2013-01-19 DIAGNOSIS — R110 Nausea: Secondary | ICD-10-CM

## 2013-01-19 DIAGNOSIS — R220 Localized swelling, mass and lump, head: Secondary | ICD-10-CM | POA: Insufficient documentation

## 2013-01-19 DIAGNOSIS — M549 Dorsalgia, unspecified: Secondary | ICD-10-CM

## 2013-01-19 LAB — CBC WITH DIFFERENTIAL/PLATELET
Basophils Absolute: 0 10*3/uL (ref 0.0–0.1)
EOS%: 0.1 % (ref 0.0–7.0)
MCH: 29.6 pg (ref 25.1–34.0)
MCV: 89.6 fL (ref 79.5–101.0)
MONO%: 11.2 % (ref 0.0–14.0)
RBC: 3.7 10*6/uL (ref 3.70–5.45)
RDW: 14.5 % (ref 11.2–14.5)

## 2013-01-19 LAB — COMPREHENSIVE METABOLIC PANEL (CC13)
AST: 9 U/L (ref 5–34)
Albumin: 3 g/dL — ABNORMAL LOW (ref 3.5–5.0)
Alkaline Phosphatase: 75 U/L (ref 40–150)
BUN: 24.3 mg/dL (ref 7.0–26.0)
Potassium: 3.7 mEq/L (ref 3.5–5.1)

## 2013-01-19 MED ORDER — LOPERAMIDE HCL 2 MG PO CAPS: 2.0000 mg | ORAL_CAPSULE | Freq: Four times a day (QID) | ORAL | Status: DC | PRN

## 2013-01-19 MED ORDER — LORAZEPAM 2 MG/ML IJ SOLN
0.5000 mg | Freq: Once | INTRAMUSCULAR | Status: AC
  Administered 2013-01-19: 0.5 mg via INTRAVENOUS

## 2013-01-19 MED ORDER — PROMETHAZINE HCL 12.5 MG PO TABS: 12.5000 mg | ORAL_TABLET | Freq: Four times a day (QID) | ORAL | Status: DC | PRN

## 2013-01-19 MED ORDER — ONDANSETRON 8 MG/50ML IVPB (CHCC)
8.0000 mg | Freq: Once | INTRAVENOUS | Status: AC
  Administered 2013-01-19: 8 mg via INTRAVENOUS

## 2013-01-19 MED ORDER — HEPARIN SOD (PORK) LOCK FLUSH 100 UNIT/ML IV SOLN
500.0000 [IU] | Freq: Once | INTRAVENOUS | Status: AC
  Administered 2013-01-19: 500 [IU] via INTRAVENOUS
  Filled 2013-01-19: qty 5

## 2013-01-19 MED ORDER — IOHEXOL 300 MG/ML  SOLN
100.0000 mL | Freq: Once | INTRAMUSCULAR | Status: AC | PRN
  Administered 2013-01-19: 80 mL via INTRAVENOUS

## 2013-01-19 MED ORDER — DEXAMETHASONE 4 MG PO TABS: ORAL_TABLET | ORAL | Status: DC

## 2013-01-19 MED ORDER — SODIUM CHLORIDE 0.9 % IV SOLN
INTRAVENOUS | Status: DC
  Administered 2013-01-19: 12:00:00 via INTRAVENOUS

## 2013-01-19 MED ORDER — DEXAMETHASONE SODIUM PHOSPHATE 10 MG/ML IJ SOLN
10.0000 mg | Freq: Once | INTRAMUSCULAR | Status: AC
  Administered 2013-01-19: 10 mg via INTRAVENOUS

## 2013-01-19 MED ORDER — SODIUM CHLORIDE 0.9 % IJ SOLN
10.0000 mL | INTRAMUSCULAR | Status: DC | PRN
  Administered 2013-01-19: 10 mL via INTRAVENOUS
  Filled 2013-01-19: qty 10

## 2013-01-19 NOTE — Telephone Encounter (Signed)
per LT add on appt so that she can dictate...pt sched for ct pt aware and gv order to Thelma Barge.

## 2013-01-19 NOTE — Progress Notes (Signed)
1300 patient vomiting while receiving NS IVF. Zofran already given, per order. Dr. Truett Perna aware. 0.5mg  ativan IV ordered. Dr. Truett Perna to assess patient at bedside. Will continue to monitor closely. [place], RN  1345 Lonna Cobb, NP at bedside to assess patient at this time. IVF continues to infuse. Patient states nausea resolved with ativan. Will continue to monitor closely. Clayborn Heron, RN  [phone] Dr. Truett Perna at bedside to assess patient. IVF infusing without difficulty. Per Dr. Truett Perna, patient to complete IVF, then report to CT, and then return to [facility] lobby to meet with provider post CT. Patient is aware of the plan and verbalizes understanding. Clayborn Heron, RN

## 2013-01-19 NOTE — Telephone Encounter (Signed)
Pt wants to draw labs with PCP

## 2013-01-19 NOTE — Progress Notes (Signed)
[facility]    OFFICE PROGRESS NOTE   INTERVAL HISTORY:   She returns today for a scheduled second cycle of systemic therapy. She reports persistent nausea and vomiting. The diarrhea has improved. She was noted to vomiting in the chemotherapy room. [name] [name] received intravenous fluids and anti-emetics. She declines further chemotherapy.  Objective:  Vital signs in last 24 hours:  There were no vitals taken for this visit.    Lymphatics: No cervical or supraclavicular nodes.? Pea-sized right axillary node. Resp: Lungs clear bilaterally Cardio: Regular rate and rhythm GI: No hepatomegaly, soft, nontender Vascular: No leg edema Neuro: Alert and oriented. The motor exam appears intact in the upper and lower extremities. Finger to nose testing is normal. She ambulates without difficulty.  Skin: 1 cm ulcerated area with oozing of blood at the left mastectomy site. Surrounding area of cutaneous nodularity measuring approximately 2 cm in maximal dimension   Portacath/PICC-without erythema  Lab Results:  Lab Results  Component Value Date   WBC 6.7 01/19/2013   HGB 10.9* 01/19/2013   HCT 33.1* 01/19/2013   MCV 89.6 01/19/2013   PLT 272 01/19/2013   X-rays: CT the brain 01/16/2013-new oval mass like area in the left parietal lobe measuring 39 x 23 mm. No enhancement. The lesion appears cystic. No other mass. I reviewed the CT images with [name] [name].   Medications: I have reviewed the patient's current medications.  Assessment/Plan: 1.Metastatic breast cancer - initially diagnosed with breast cancer in 1996 status post left mastectomy and axillary lymph node dissection. She developed a chest wall recurrence in 2001 and completed radiation. In April 2003, CT showed mediastinal, internal mammary, and right axillary adenopathy and two liver lesions. MRI of the thoracic spine June 18, 2002 revealed osseous abnormalities at T3 and T4 suspicious for metastatic disease  with extension into the T3 pedicle. There was extra osseous extension compressing and displacing the thoracic cord. She completed radiation from T1 through T6 June 21, 2002 through July 13, 2002. CT scan Nov 11, 2002 showed progression of metastatic disease with precardiac nodes, left pleural and lower lobe disease, subcutaneous nodules at the left anterior chest wall and progressive metastatic disease involving the liver. She began Femara in May/early June 2004 with subsequent resolution of chest wall nodules. CT scans January 14, 2005, of the chest and abdomen revealed findings of decreased pleural-based metastatic disease in the lower left hemithorax and no new or progressive disease in the chest. Small lesion in the right liver measuring 13.0 x 10.0 mm was reported as stable when compared to her previous study. No other liver lesions were seen. Bone scan January 22, 2006 showed degenerative changes. She discontinued Femara February 2009 and was lost a followup. She presented in March 2010 with an ulcerated chest wall lesion and an elevated CA27.29. Restaging CTs September 18, 2008 showed a 1.3 x 2.2 cm subdermal lesion at the left anterior chest, multiple enlarged metastases in the left hemithorax at the mediastinum and pleura felt to represent necrotic lymph nodes versus pleural metastases. Right axillary lymph node was decreased in size compared to 2006. There were no suspicious bone lesions. A previously noted lesion in the inferior right liver was not clearly seen. She resumed Femara March 2010. On October 23, 2008 the chest wall lesion had improved. The CA27.29 on October 23, 2008 was stable at 60. She discontinued Femara in June 2010 and CTs of the abdomen and pelvis April 06, 2009 showed slight decrease in the  size of a pleural-based nodule in the left lower lobe. There was no other evidence for metastatic disease in the abdomen or pelvis. She resumed Femara on April 11, 2009. CA27.29 returned at 87 on  April 11, 2009. CT of the abdomen and pelvis on May 30, 2009 showed an increase in multiple pericardial masses, increased left chest wall subcutaneous nodule, stable pleural-based metastases and stable liver lesions. She discontinued Femara in November 2010 due to nausea and vomiting. Femara was resumed following an office visit June 06, 2009. Dr. Myrle Sheng reviewed a CT from May 30, 2009 with a [name] radiologist comparing the scan to scans from March 2010 and October 2010. In addition, a CT scan from 2006 was reviewed. The pleural-based lesions in the left chest had not changed significantly since March 2010. The pericardial masses appeared slightly larger. Both the pericardial masses and pleural-based masses were larger compared to the CT from 2006. She discontinued Femara January 2011. The left chest wall nodule recurred during the time she was off of Femara. Femara was resumed following an office visit 10/02/2009. The nodule again improved. Restaging CT of the chest 08/16/2010 was stable compared to a CT from 03/29/2010. Most recent CA 27-29 on 01/16/2012 was stable at 340. Femara was continued. CT the chest 02/23/2012 with progressive pleural-based tumor in the left chest with probable involvement of T3. Femara was discontinued. MRI of the thoracic spine on 03/05/2012 showed severe T3 and T4 metastatic disease with mild pathologic compression fractures and epidural extension causing some cord compression at T3. There was no definite cord edema. There was associated extensive bilateral neural foraminal involvement at the T2 and T3 nerve levels greater on the left. There was associated very bulky mediastinal and pleural metastatic disease. She underwent a laminectomy procedure on 04/01/2012 with resection of epidural metastatic tumor. 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 The tumor was ER positive 100%; PR positive 53%; HER-2/neu by CISH showed  amplification. She declined a trial of chemotherapy and Herceptin. She began Faslodex on 07/12/2012. The CA 27-29 tumor marker was further increased on 10/14/2012. MRI thoracic spine 12/26/2012 showed progressive metastatic disease to the spine with increased pathologic fractures at T3 and T4. Despite the previous T3 laminectomies there was early cord compression at T3 and right-sided neural foraminal extension of epidural tumor at T2-T3. Bulky extra osseous metastatic disease was again demonstrated within the mediastinum and left pleural space. She began treatment with Taxol/Herceptin/Pertuzumab with the Taxol given on a day 1/day 8 schedule and the Herceptin/Pertuzumab given day 1 only of a 21 day cycle on 12/29/2012.  2. Ulcerated left chest lesion, likely a metastatic lesion.  3. Severe right low back/buttock pain radiating to the left leg October 2010 with MRI on April 06, 2009 revealing a disk protrusion at L5-S1 displacing the right S1 nerve root. She was evaluated by Dr. Jordan Likes. The pain has resolved.  4. Hospitalization December 1 through May 31, 2009 with nausea, vomiting and abdominal pain - resolved.  5. 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  6. Pain at the right back/ iliac region October 25, 2008.  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  8. Status post left knee replacement.  9. Chronic edema of the left lower leg.  10. "Migraines". She takes Imitrex as needed.  11. Falls and balance problems occurring over the past year. She has been referred to neurology. No difficulty with ambulation today.  12. Right breast with area of firmness on exam March 2013 -negative right breast mammogram and ultrasound 09/15/2011 . Negative right breast mammogram 09/16/2012.  13. Pain at upper back. Resolved following the laminectomy procedure. The pain increased. CT and MRI confirmed progressive disease.improved  14. Unsteady  gait on 04/14/2012-much improved.  15. Nausea/vomiting following Faslodex 07/26/2012.  16. Weight loss-likely secondary to progression of the metastatic breast cancer. She continues to lose weight.  17. Persistent nausea and diarrhea following initiation of Taxol/Herceptin/pertuzumab 12/29/2012. 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 This is most likely related to the parietal brain mass. We placed her on Decadron at a dose of 8 mg twice daily. I contacted Dr. Kathrynn Running and he will order an MRI of the brain. He will discuss the case with Dr. Newell Coral regarding the indication for resection of the brain mass versus radiation. She has extensive metastatic breast cancer, but there is a high chance of clinical improvement and the potential for a prolonged survival with HER-2 directed therapy. However [name] [name] stated repeatedly that she will decline further chemotherapy. We will arrange for an office visit early next week for further discussion. We checked a followup CA 27.29 level today. I am encouraged that her back pain is much improved after 1 cycle of Taxol/Herceptin/pertuzumab.   Thornton Papas, MD  01/19/2013  6:27 PM

## 2013-01-19 NOTE — Progress Notes (Signed)
OFFICE PROGRESS NOTE  Interval history:  [name] [name] presents today to proceed with cycle 2 Taxol/Herceptin/Pertuzumab. She reports persistent nausea with intermittent vomiting. She denies headaches. No vision change. Gait is chronically unsteady. No falls. The diarrhea she was experiencing last week has resolved. She denies fever. No shortness of breath. Back pain continues to be markedly improved.   Objective: There were no vitals taken for this visit.  Pupils equal round and reactive to light. Extraocular movements intact. Oropharynx without thrush. Lungs clear. Regular cardiac rhythm. Abdomen soft. Extremities without edema. She initially had difficulty with finger-to-nose. With further instructions she was able to correctly perform. She is alert and oriented. Follows commands. Motor strength intact.   Lab Results: Lab Results  Component Value Date   WBC 6.7 01/19/2013   HGB 10.9* 01/19/2013   HCT 33.1* 01/19/2013   MCV 89.6 01/19/2013   PLT 272 01/19/2013    Chemistry:    Chemistry      Component Value Date/Time   NA 137 01/19/2013 1114   NA 139 01/03/2013 0950   K 3.7 01/19/2013 1114   K 3.4* 01/03/2013 0950   CL 101 01/03/2013 0950   CL 107 10/14/2012 1047   CO2 28 01/19/2013 1114   CO2 29 01/03/2013 0950   BUN 24.3 01/19/2013 1114   BUN 15 01/03/2013 0950   CREATININE 1.1 01/19/2013 1114   CREATININE 0.76 01/03/2013 0950   CREATININE 0.89 10/23/2010 1514      Component Value Date/Time   CALCIUM 9.6 01/19/2013 1114   CALCIUM 9.5 01/03/2013 0950   ALKPHOS 75 01/19/2013 1114   ALKPHOS 86 10/23/2010 1514   AST 9 01/19/2013 1114   AST 11 10/23/2010 1514   ALT 8 01/19/2013 1114   ALT 8 10/23/2010 1514   BILITOT 0.27 01/19/2013 1114   BILITOT 0.3 10/23/2010 1514       Studies/Results: Ct Thoracic Spine Wo Contrast  12/24/2012   *RADIOLOGY REPORT*  Clinical Data: Back pain.  History of upper thoracic fusion.  CT THORACIC SPINE WITHOUT CONTRAST  Technique:  Multidetector CT imaging of the  thoracic spine was performed without intravenous contrast administration. Multiplanar CT image reconstructions were also generated  Comparison: CT scan 03/16/2012 and MRI 03/05/2012.  Findings: There are pedicle screws and posterior rods extending from T1-T6.  Severe osseous metastatic disease involving T3 and T4. The T3 vertebral body demonstrates significant compression, progressive since the prior CT scan.  There is mild retropulsion involving the posterior aspect of the vertebral body but there is a wide decompressive laminectomy without evidence of spinal stenosis. There is mild lucency surrounding the pedicle screws and T1, T2, T5 and T6 which may suggest loosening.  There are multiple other metastatic bone lesions scattered throughout the thoracic spine but I do not see any new pathologic compression fractures.  There is a progressive metastatic disease noted in the posterior aspect of the T5 vertebral body which may surround the right pedicle screw.  Destructive rib lesions are also noted.  Large mediastinal mass/adenopathy appears progressive since prior MRI.  There is also a small left pleural effusion and overlying atelectasis.  IMPRESSION:  1.  Further pathologic compression of the T3 vertebral body and slightly progressive pathologic compression of T4.  Retropulsion at T3 but no significant spinal stenosis due to a wide decompressive laminectomy. 2.  Progressive metastatic disease involving the thoracic spine and ribs. 3.  No new compression fractures. 4.  Mild lucency surrounding the pedicle screws and T1, T2, T5 and  T6 which may suggest loosening. 5.  Progressive mediastinal disease.   Original Report Authenticated By: Rudie Meyer, M.D.   Mr Thoracic Spine W Wo Contrast  12/26/2012   *RADIOLOGY REPORT*  Clinical Data: Progressive back pain for 1 month.  History of breast cancer with osseous metastases and spinal fusion.  MRI THORACIC SPINE WITHOUT AND WITH CONTRAST  Technique:  Multiplanar and  multiecho pulse sequences of the thoracic spine were obtained without and with intravenous contrast.  Contrast: 17mL MULTIHANCE GADOBENATE DIMEGLUMINE 529 MG/ML IV SOLN  Comparison: Thoracic spine CT 12/24/2012.  MRI 04/19/2012.  Findings: Sagittal localizing images through the cervical spine demonstrate no definite osseous metastases or pathologic fractures in the cervical spine.  Patient is status post T1-T6 pedicle screw and rod fusion. T3 laminectomies have been performed.  The hardware appears grossly stable, although did demonstrate some loosening on the recent CT.  As demonstrated on the recent CT, there are progressive pathologic fractures at T3 and T4 with associated enhancing epidural tumor on the right extending into the right T2-T3 foramen.  There is mild right-sided cord compression with narrowing of the AP diameter of the canal to approximately 5 mm at T3.  There is progressive osseous metastatic disease posteriorly in the T5 vertebral body, the T8 spinous process and in several ribs.  No other pathologic fractures are identified.  There is multi metastatic disease within the left pleural space and mediastinum.  A small left pleural effusion is present.  IMPRESSION:  1.  As demonstrated on the recent thoracic spine CT, there is progressive metastatic disease to the spine with increased pathologic fractures at T3 and T4. Despite the previous T3 laminectomies, there is early cord compression at T3 and right- sided neural foraminal extension of epidural tumor at T2-T3. 2.  Bulky extra osseous metastatic disease is again demonstrated within the mediastinum and left pleural space.  Critical Value/emergent results were called by telephone at the time of interpretation on 12/02/2012 at 1450 hours to Dr. Darnelle Catalan, who verbally acknowledged these results.   Original Report Authenticated By: Carey Bullocks, M.D.   Ir Fluoro Guide Cv Line Right  01/03/2013   *RADIOLOGY REPORT*  Clinical Data: Metastatic breast  carcinoma and need for Port-A-Cath for chemotherapy.  IMPLANTED PORT A CATH PLACEMENT WITH ULTRASOUND AND FLUOROSCOPIC GUIDANCE  Sedation:  4.0 mg IV Versed; 100 mcg IV Fentanyl.  Total Moderate Sedation Time:  45 minutes.  Additional Medications:  2 grams IV Ancef.  As antibiotic prophylaxis, Ancef was ordered pre-procedure and administered intravenously within one hour of incision.  Fluoroscopy Time:  24 seconds.  Procedure:  The procedure, risks, benefits, and alternatives were explained to the patient.  Questions regarding the procedure were encouraged and answered.  The patient understands and consents to the procedure.  The right neck and chest were prepped with chlorhexidine in a sterile fashion, and a sterile drape was applied covering the operative field.  Maximum barrier sterile technique with sterile gowns and gloves were used for the procedure.  Local anesthesia was provided with 1% lidocaine and lidocaine with epinephrine.  After creating a small venotomy incision, a 21 gauge needle was advanced into the right internal jugular vein under direct, real- time ultrasound guidance.  Ultrasound image documentation was performed.  After securing guidewire access, an 8 Fr dilator was placed.  A J-wire was kinked to measure appropriate catheter length.  A subcutaneous port pocket was then created along the upper chest wall utilizing sharp and blunt dissection.  Portable cautery  was utilized.  The pocket was irrigated with sterile saline.  A single lumen power injectable port was chosen for placement.  The 8 Fr catheter was tunneled from the port pocket site to the venotomy incision.  The port was placed in the pocket.  External catheter was trimmed to appropriate length based on guidewire measurement.  At the venotomy, an 8 Fr peel-away sheath was placed over a guidewire.  The catheter was then placed through the sheath and the sheath removed.  Final catheter positioning was confirmed and documented with a  fluoroscopic spot image.  The port was accessed with a needle and aspirated and flushed with heparinized saline. The needle was removed.  The venotomy and port pocket incisions were closed with subcutaneous 3-0 Monocryl and subcuticular 4-0 Vicryl.  Dermabond was applied to both incisions.  Complications: None.  No pneumothorax.  Findings:  After catheter placement, the tip lies at the cavoatrial junction.  The catheter aspirates normally and is ready for immediate use.  IMPRESSION:  Placement of single lumen port a cath via right internal jugular vein.  The catheter tip lies at the cavoatrial junction.  A power injectable port a cath was placed and is ready for immediate use.   Original Report Authenticated By: Irish Lack, M.D.   Ir US Guide Vasc Access Right  01/03/2013   *RADIOLOGY REPORT*  Clinical Data: Metastatic breast carcinoma and need for Port-A-Cath for chemotherapy.  IMPLANTED PORT A CATH PLACEMENT WITH ULTRASOUND AND FLUOROSCOPIC GUIDANCE  Sedation:  4.0 mg IV Versed; 100 mcg IV Fentanyl.  Total Moderate Sedation Time:  45 minutes.  Additional Medications:  2 grams IV Ancef.  As antibiotic prophylaxis, Ancef was ordered pre-procedure and administered intravenously within one hour of incision.  Fluoroscopy Time:  24 seconds.  Procedure:  The procedure, risks, benefits, and alternatives were explained to the patient.  Questions regarding the procedure were encouraged and answered.  The patient understands and consents to the procedure.  The right neck and chest were prepped with chlorhexidine in a sterile fashion, and a sterile drape was applied covering the operative field.  Maximum barrier sterile technique with sterile gowns and gloves were used for the procedure.  Local anesthesia was provided with 1% lidocaine and lidocaine with epinephrine.  After creating a small venotomy incision, a 21 gauge needle was advanced into the right internal jugular vein under direct, real- time ultrasound  guidance.  Ultrasound image documentation was performed.  After securing guidewire access, an 8 Fr dilator was placed.  A J-wire was kinked to measure appropriate catheter length.  A subcutaneous port pocket was then created along the upper chest wall utilizing sharp and blunt dissection.  Portable cautery was utilized.  The pocket was irrigated with sterile saline.  A single lumen power injectable port was chosen for placement.  The 8 Fr catheter was tunneled from the port pocket site to the venotomy incision.  The port was placed in the pocket.  External catheter was trimmed to appropriate length based on guidewire measurement.  At the venotomy, an 8 Fr peel-away sheath was placed over a guidewire.  The catheter was then placed through the sheath and the sheath removed.  Final catheter positioning was confirmed and documented with a fluoroscopic spot image.  The port was accessed with a needle and aspirated and flushed with heparinized saline. The needle was removed.  The venotomy and port pocket incisions were closed with subcutaneous 3-0 Monocryl and subcuticular 4-0 Vicryl.  Dermabond was applied to  both incisions.  Complications: None.  No pneumothorax.  Findings:  After catheter placement, the tip lies at the cavoatrial junction.  The catheter aspirates normally and is ready for immediate use.  IMPRESSION:  Placement of single lumen port a cath via right internal jugular vein.  The catheter tip lies at the cavoatrial junction.  A power injectable port a cath was placed and is ready for immediate use.   Original Report Authenticated By: Irish Lack, M.D.    Medications: I have reviewed the patient's current medications.  Assessment/Plan:  1. Persistent nausea of unclear etiology. 2. Metastatic breast cancer status post 1 cycle of Taxol/Herceptin/pertuzumab with significant improvement in back pain.  Disposition-she has persistent nausea of unclear etiology. We feel it is unlikely the nausea is  related to the chemotherapy she received 2 weeks ago.   We are referring her for a brain CT to evaluate for brain metastases. We will go ahead and give her Decadron 10 mg IV prior to the scan.    Lonna Cobb ANP/GNP-BC

## 2013-01-20 ENCOUNTER — Ambulatory Visit: Payer: Medicare HMO

## 2013-01-20 ENCOUNTER — Encounter: Payer: Self-pay | Admitting: Radiation Oncology

## 2013-01-20 DIAGNOSIS — C7931 Secondary malignant neoplasm of brain: Secondary | ICD-10-CM | POA: Insufficient documentation

## 2013-01-20 NOTE — Progress Notes (Signed)
Radiation Oncology         [phone] ________________________________  Name: [name] [name] MRN: [id number]  Date: 01/21/2013  DOB: [birth year]  Follow-Up Visit Note  CC: [name] Harp, MD  Ladene Artist, MD  Diagnosis:   67 yo woman with metastatic breast cancer s/p stereotactic radiosurgery to the T3 and T4 vertebral bodies now presenting with newly diagnosed brain metastases  Interval Since Last Radiation:  9  months  Narrative:  The patient returns today following abnormal brain scan. The patient has been receiving systemic chemotherapy with improvement in bone pain. However, she has been experiencing increasing headaches and nausea. Head CT demonstrated a 4 cm cystic left parietal brain lesion on noncontrast study. This was highly suggestive of newly diagnosed brain metastases. The patient consequently underwent brain MRI earlier today and has been referred to discuss the results the brain MRI and potentially coordinate care in the management of newly diagnosed brain metastases.                              ALLERGIES:  is allergic to banana.  Meds: Current Outpatient Prescriptions  Medication Sig Dispense Refill  . albuterol (PROVENTIL HFA;VENTOLIN HFA) 108 (90 BASE) MCG/ACT inhaler Inhale 2 puffs into the lungs every 6 (six) hours as needed. Shortness of breath  18 g  5  . dexamethasone (DECADRON) 4 MG tablet Take 2 tablets (8 mg) twice a day  60 tablet  0  . loperamide (IMODIUM) 2 MG capsule Take 1 capsule (2 mg total) by mouth 4 (four) times daily as needed for diarrhea or loose stools.  30 capsule  0  . morphine (MSIR) 15 MG tablet Take 15-30 mg by mouth every 4 (four) hours as needed for pain.      [name] ondansetron (ZOFRAN) 8 MG tablet Take 1 tablet (8 mg total) by mouth every 8 (eight) hours as needed for nausea.  30 tablet  2  . prochlorperazine (COMPAZINE) 10 MG tablet Take 10 mg by mouth every 6 (six) hours as needed. Nausea      . promethazine (PHENERGAN) 12.5 MG tablet  Take 1 tablet (12.5 mg total) by mouth every 6 (six) hours as needed for nausea.  30 tablet  0  . SUMAtriptan (IMITREX) 50 MG tablet Take 50 mg by mouth every 2 (two) hours as needed for migraine.       No current facility-administered medications for this encounter.    Physical Findings: The patient is in no acute distress. Patient is alert and oriented.  vitals were not taken for this visit..  No significant changes.  Lab Findings: Lab Results  Component Value Date   WBC 6.7 01/19/2013   HGB 10.9* 01/19/2013   HCT 33.1* 01/19/2013   MCV 89.6 01/19/2013   PLT 272 01/19/2013    @LASTCHEM @  Radiographic Findings: Ct Head W Contrast  01/19/2013   *RADIOLOGY REPORT*  Clinical Data: Nausea with refractory vomiting.  History of breast carcinoma.  Evaluate for metastatic disease.  CT HEAD WITH CONTRAST  Technique:  Contiguous axial images were obtained from the base of the skull through the vertex with intravenous contrast  Contrast: 80mL OMNIPAQUE IOHEXOL 300 MG/ML  SOLN  Comparison: 12/24/2011  Findings: There is a new oval mass-like area of hypoattenuation in the left parietal lobe measuring 39 mm x 23 mm in size. It mildly effaces the overlying sulci but causes no midline shift.  There is no  convincing associated enhancement with this.  It is homogeneously low attenuation with average Hounsfield units of 13.8.  It appears cystic.  There are mild areas of subcortical white matter hypoattenuation that are consistent with chronic microvascular ischemic change. These are stable.  There are no other mass-like lesions.  There are no areas of abnormal parenchymal enhancement. There are is no evidence of a recent transcortical infarct.  The ventricles are normal in size and configuration.  There are no extra-axial masses or abnormal fluid collections. There is no abnormal extra-axial enhancement.  There is a stable mucous retention cyst in the right maxillary sinus.  The remaining sinuses are clear.  No  skull lesion is seen.  IMPRESSION: Cystic mass has developed in the left parietal lobe.  It shows no convincing enhancement, but given the provided history and the interval change, this is still worrisome for metastatic disease. Recommend follow-up brain MRI with without contrast.  No other masses or changes from the prior study.  Mild chronic microvascular ischemic changes stable from the prior exam.  No hydrocephalus.  No areas of abnormal enhancement.   Original Report Authenticated By: Amie Portland, M.D.   Ct Thoracic Spine Wo Contrast  12/24/2012   *RADIOLOGY REPORT*  Clinical Data: Back pain.  History of upper thoracic fusion.  CT THORACIC SPINE WITHOUT CONTRAST  Technique:  Multidetector CT imaging of the thoracic spine was performed without intravenous contrast administration. Multiplanar CT image reconstructions were also generated  Comparison: CT scan 03/16/2012 and MRI 03/05/2012.  Findings: There are pedicle screws and posterior rods extending from T1-T6.  Severe osseous metastatic disease involving T3 and T4. The T3 vertebral body demonstrates significant compression, progressive since the prior CT scan.  There is mild retropulsion involving the posterior aspect of the vertebral body but there is a wide decompressive laminectomy without evidence of spinal stenosis. There is mild lucency surrounding the pedicle screws and T1, T2, T5 and T6 which may suggest loosening.  There are multiple other metastatic bone lesions scattered throughout the thoracic spine but I do not see any new pathologic compression fractures.  There is a progressive metastatic disease noted in the posterior aspect of the T5 vertebral body which may surround the right pedicle screw.  Destructive rib lesions are also noted.  Large mediastinal mass/adenopathy appears progressive since prior MRI.  There is also a small left pleural effusion and overlying atelectasis.  IMPRESSION:  1.  Further pathologic compression of the T3  vertebral body and slightly progressive pathologic compression of T4.  Retropulsion at T3 but no significant spinal stenosis due to a wide decompressive laminectomy. 2.  Progressive metastatic disease involving the thoracic spine and ribs. 3.  No new compression fractures. 4.  Mild lucency surrounding the pedicle screws and T1, T2, T5 and T6 which may suggest loosening. 5.  Progressive mediastinal disease.   Original Report Authenticated By: Rudie Meyer, M.D.   Mr Thoracic Spine W Wo Contrast  12/26/2012   *RADIOLOGY REPORT*  Clinical Data: Progressive back pain for 1 month.  History of breast cancer with osseous metastases and spinal fusion.  MRI THORACIC SPINE WITHOUT AND WITH CONTRAST  Technique:  Multiplanar and multiecho pulse sequences of the thoracic spine were obtained without and with intravenous contrast.  Contrast: 17mL MULTIHANCE GADOBENATE DIMEGLUMINE 529 MG/ML IV SOLN  Comparison: Thoracic spine CT 12/24/2012.  MRI 04/19/2012.  Findings: Sagittal localizing images through the cervical spine demonstrate no definite osseous metastases or pathologic fractures in the cervical spine.  Patient is  status post T1-T6 pedicle screw and rod fusion. T3 laminectomies have been performed.  The hardware appears grossly stable, although did demonstrate some loosening on the recent CT.  As demonstrated on the recent CT, there are progressive pathologic fractures at T3 and T4 with associated enhancing epidural tumor on the right extending into the right T2-T3 foramen.  There is mild right-sided cord compression with narrowing of the AP diameter of the canal to approximately 5 mm at T3.  There is progressive osseous metastatic disease posteriorly in the T5 vertebral body, the T8 spinous process and in several ribs.  No other pathologic fractures are identified.  There is multi metastatic disease within the left pleural space and mediastinum.  A small left pleural effusion is present.  IMPRESSION:  1.  As demonstrated  on the recent thoracic spine CT, there is progressive metastatic disease to the spine with increased pathologic fractures at T3 and T4. Despite the previous T3 laminectomies, there is early cord compression at T3 and right- sided neural foraminal extension of epidural tumor at T2-T3. 2.  Bulky extra osseous metastatic disease is again demonstrated within the mediastinum and left pleural space.  Critical Value/emergent results were called by telephone at the time of interpretation on 12/02/2012 at 1450 hours to Dr. Darnelle Catalan, who verbally acknowledged these results.   Original Report Authenticated By: Carey Bullocks, M.D.   Ir Fluoro Guide Cv Line Right  01/03/2013   *RADIOLOGY REPORT*  Clinical Data: Metastatic breast carcinoma and need for Port-A-Cath for chemotherapy.  IMPLANTED PORT A CATH PLACEMENT WITH ULTRASOUND AND FLUOROSCOPIC GUIDANCE  Sedation:  4.0 mg IV Versed; 100 mcg IV Fentanyl.  Total Moderate Sedation Time:  45 minutes.  Additional Medications:  2 grams IV Ancef.  As antibiotic prophylaxis, Ancef was ordered pre-procedure and administered intravenously within one hour of incision.  Fluoroscopy Time:  24 seconds.  Procedure:  The procedure, risks, benefits, and alternatives were explained to the patient.  Questions regarding the procedure were encouraged and answered.  The patient understands and consents to the procedure.  The right neck and chest were prepped with chlorhexidine in a sterile fashion, and a sterile drape was applied covering the operative field.  Maximum barrier sterile technique with sterile gowns and gloves were used for the procedure.  Local anesthesia was provided with 1% lidocaine and lidocaine with epinephrine.  After creating a small venotomy incision, a 21 gauge needle was advanced into the right internal jugular vein under direct, real- time ultrasound guidance.  Ultrasound image documentation was performed.  After securing guidewire access, an 8 Fr dilator was placed.  A  J-wire was kinked to measure appropriate catheter length.  A subcutaneous port pocket was then created along the upper chest wall utilizing sharp and blunt dissection.  Portable cautery was utilized.  The pocket was irrigated with sterile saline.  A single lumen power injectable port was chosen for placement.  The 8 Fr catheter was tunneled from the port pocket site to the venotomy incision.  The port was placed in the pocket.  External catheter was trimmed to appropriate length based on guidewire measurement.  At the venotomy, an 8 Fr peel-away sheath was placed over a guidewire.  The catheter was then placed through the sheath and the sheath removed.  Final catheter positioning was confirmed and documented with a fluoroscopic spot image.  The port was accessed with a needle and aspirated and flushed with heparinized saline. The needle was removed.  The venotomy and port pocket incisions  were closed with subcutaneous 3-0 Monocryl and subcuticular 4-0 Vicryl.  Dermabond was applied to both incisions.  Complications: None.  No pneumothorax.  Findings:  After catheter placement, the tip lies at the cavoatrial junction.  The catheter aspirates normally and is ready for immediate use.  IMPRESSION:  Placement of single lumen port a cath via right internal jugular vein.  The catheter tip lies at the cavoatrial junction.  A power injectable port a cath was placed and is ready for immediate use.   Original Report Authenticated By: Irish Lack, M.D.   Ir US Guide Vasc Access Right  01/03/2013   *RADIOLOGY REPORT*  Clinical Data: Metastatic breast carcinoma and need for Port-A-Cath for chemotherapy.  IMPLANTED PORT A CATH PLACEMENT WITH ULTRASOUND AND FLUOROSCOPIC GUIDANCE  Sedation:  4.0 mg IV Versed; 100 mcg IV Fentanyl.  Total Moderate Sedation Time:  45 minutes.  Additional Medications:  2 grams IV Ancef.  As antibiotic prophylaxis, Ancef was ordered pre-procedure and administered intravenously within one hour of  incision.  Fluoroscopy Time:  24 seconds.  Procedure:  The procedure, risks, benefits, and alternatives were explained to the patient.  Questions regarding the procedure were encouraged and answered.  The patient understands and consents to the procedure.  The right neck and chest were prepped with chlorhexidine in a sterile fashion, and a sterile drape was applied covering the operative field.  Maximum barrier sterile technique with sterile gowns and gloves were used for the procedure.  Local anesthesia was provided with 1% lidocaine and lidocaine with epinephrine.  After creating a small venotomy incision, a 21 gauge needle was advanced into the right internal jugular vein under direct, real- time ultrasound guidance.  Ultrasound image documentation was performed.  After securing guidewire access, an 8 Fr dilator was placed.  A J-wire was kinked to measure appropriate catheter length.  A subcutaneous port pocket was then created along the upper chest wall utilizing sharp and blunt dissection.  Portable cautery was utilized.  The pocket was irrigated with sterile saline.  A single lumen power injectable port was chosen for placement.  The 8 Fr catheter was tunneled from the port pocket site to the venotomy incision.  The port was placed in the pocket.  External catheter was trimmed to appropriate length based on guidewire measurement.  At the venotomy, an 8 Fr peel-away sheath was placed over a guidewire.  The catheter was then placed through the sheath and the sheath removed.  Final catheter positioning was confirmed and documented with a fluoroscopic spot image.  The port was accessed with a needle and aspirated and flushed with heparinized saline. The needle was removed.  The venotomy and port pocket incisions were closed with subcutaneous 3-0 Monocryl and subcuticular 4-0 Vicryl.  Dermabond was applied to both incisions.  Complications: None.  No pneumothorax.  Findings:  After catheter placement, the tip lies  at the cavoatrial junction.  The catheter aspirates normally and is ready for immediate use.  IMPRESSION:  Placement of single lumen port a cath via right internal jugular vein.  The catheter tip lies at the cavoatrial junction.  A power injectable port a cath was placed and is ready for immediate use.   Original Report Authenticated By: Irish Lack, M.D.    Impression:  Ms. Kessinger has newly diagnosed brain metastases. At this point, the patient would potentially benefit from radiotherapy. The options include whole brain irradiation versus stereotactic radiosurgery. There are pros and cons associated with each of these potential treatment  options. Whole brain radiotherapy would treat the known metastatic deposits and help provide some reduction of risk for future brain metastases. However, whole brain radiotherapy carries potential risks including hair loss, subacute somnolence, and neurocognitive changes including a possible reduction in short-term memory. Whole brain radiotherapy also may carry a lower likelihood of tumor control at the treatment sites because of the low-dose used. Stereotactic radiosurgery carries a higher likelihood for local tumor control at the targeted sites with lower associated risk for neurocognitive changes such as memory loss. However, the use of stereotactic radiosurgery in this setting may leave the patient at increased risk for new brain metastases elsewhere in the brain as high as 50-60%. Accordingly, patients who receive stereotactic radiosurgery in this setting should undergo ongoing surveillance imaging with brain MRI more frequently in order to identify and treat new small brain metastases before they become symptomatic. Stereotactic radiosurgery does carry some different risks, including a risk of radionecrosis.  PLAN: Today, I reviewed the findings and workup thus far with the patient. We discussed the dilemma regarding whole brain radiotherapy versus stereotactic  radiosurgery. We discussed the pros and cons of each. We also discussed the logistics and delivery of each. We reviewed the results associated with each of the treatments described above. The patient seems to understand the treatment options and would like to proceed with stereotactic radiosurgery.  I spent 60 minutes minutes face to face with the patient and more than 50% of that time was spent in counseling and/or coordination of care.    _____________________________________  Artist Pais. Kathrynn Running, M.D.

## 2013-01-21 ENCOUNTER — Ambulatory Visit
Admission: RE | Admit: 2013-01-21 | Discharge: 2013-01-21 | Disposition: A | Discharge status: Home / Self Care | Payer: Medicare HMO | Source: Ambulatory Visit | Attending: Radiation Oncology | Admitting: Radiation Oncology

## 2013-01-21 ENCOUNTER — Encounter: Payer: Self-pay | Admitting: Radiation Oncology

## 2013-01-21 ENCOUNTER — Other Ambulatory Visit (HOSPITAL_BASED_OUTPATIENT_CLINIC_OR_DEPARTMENT_OTHER): Payer: Medicare HMO

## 2013-01-21 ENCOUNTER — Other Ambulatory Visit: Payer: Medicare HMO

## 2013-01-21 VITALS — BP 132/64 | HR 85 | Temp 98.7°F | Resp 18 | Ht 68.0 in | Wt 170.6 lb

## 2013-01-21 DIAGNOSIS — C50912 Malignant neoplasm of unspecified site of left female breast: Secondary | ICD-10-CM

## 2013-01-21 DIAGNOSIS — C50919 Malignant neoplasm of unspecified site of unspecified female breast: Secondary | ICD-10-CM

## 2013-01-21 DIAGNOSIS — C7931 Secondary malignant neoplasm of brain: Secondary | ICD-10-CM

## 2013-01-21 DIAGNOSIS — C801 Malignant (primary) neoplasm, unspecified: Secondary | ICD-10-CM

## 2013-01-21 DIAGNOSIS — C7951 Secondary malignant neoplasm of bone: Secondary | ICD-10-CM

## 2013-01-21 DIAGNOSIS — C800 Disseminated malignant neoplasm, unspecified: Secondary | ICD-10-CM

## 2013-01-21 LAB — CBC WITH DIFFERENTIAL/PLATELET
BASO%: 0.5 % (ref 0.0–2.0)
Eosinophils Absolute: 0 10*3/uL (ref 0.0–0.5)
LYMPH%: 15.4 % (ref 14.0–49.7)
MCHC: 33 g/dL (ref 31.5–36.0)
MONO#: 1 10*3/uL — ABNORMAL HIGH (ref 0.1–0.9)
NEUT#: 5.6 10*3/uL (ref 1.5–6.5)
Platelets: 306 10*3/uL (ref 145–400)
RBC: 3.49 10*6/uL — ABNORMAL LOW (ref 3.70–5.45)
RDW: 14.2 % (ref 11.2–14.5)
WBC: 7.9 10*3/uL (ref 3.9–10.3)
lymph#: 1.2 10*3/uL (ref 0.9–3.3)

## 2013-01-21 LAB — CANCER ANTIGEN 27.29: CA 27.29: 601 U/mL — ABNORMAL HIGH (ref 0–39)

## 2013-01-21 MED ORDER — GADOBENATE DIMEGLUMINE 529 MG/ML IV SOLN
15.0000 mL | Freq: Once | INTRAVENOUS | Status: AC | PRN
  Administered 2013-01-21: 15 mL via INTRAVENOUS

## 2013-01-21 NOTE — Progress Notes (Signed)
Stopped by [facility]. They have been unable to locate patient within the cancer center to transport her home. Phoned patient's mobile but, no answer. Phoned home number. Patient answered, confirmed she is safe and reports [facility] dropped her off.

## 2013-01-21 NOTE — Progress Notes (Signed)
Location/Histology of Brain Tumor: There is a new oval mass-like area of hypoattenuation in  the left parietal lobe measuring 39 mm x 23 mm in size. It mildly  effaces the overlying sulci but causes no midline shift. There is  no convincing associated enhancement with this. It is  homogeneously low attenuation with average Hounsfield units of  13.8. It appears cystic.  Patient presented with symptoms of:  Migraines, falls, unsteady gait., weight loss, persistent nausea and vomiting  Past or anticipated interventions, if any, per neurosurgery: patient of dr. Newell Coral resection vs. radiation  Past or anticipated interventions, if any, per medical oncology: patient declines further chemo  Dose of Decadron, if applicable: decadron 8 mg bid  Recent neurologic symptoms, if any:   Seizures: None noted  Headaches: migraines  Nausea: persistent nausea and vomiting  Dizziness/ataxia: unsteady gait, recent falls  Difficulty with hand coordination: None noted  Focal numbness/weakness: None noted   Visual deficits/changes: None noted  Confusion/Memory deficits: None noted  Painful bone metastases at present, if any: None  SAFETY ISSUES: Prior radiation? Indication for treatment: Palliation, prevention of spinal cord injury in conjunction with separation surgery and reconstruction  Radiation treatment dates: 04/21/2012  Site/dose: [name] [name] received stereotactic radiosurgery to the targeted metastasis in the T3 and T4 vertebral bodies were treated using 9 Static Gantry IMRT Beams to a prescription dose of 18 Gy.  Pacemaker/ICD? NO  Possible current pregnancy? NO  Is the patient on methotrexate? NO  Additional Complaints / other details: 67 year old female. She has persistent nausea and vomiting. This is most likely related to the parietal brain mass. We placed her on Decadron at a dose of 8 mg twice daily. [name] [name] contacted Dr. Kathrynn Running and he will order an MRI of the brain. He  will discuss the case with Dr. Newell Coral regarding the indication for resection of the brain mass versus radiation. She has extensive metastatic breast cancer, but there is a high chance of clinical improvement and the potential for a prolonged survival with HER-2 directed therapy. However [name] [name] stated repeatedly that she will decline further chemotherapy. [name] [name] encouraged that her back pain is much improved after 1 cycle of Taxol/Herceptin/pertuzumab.

## 2013-01-21 NOTE — Progress Notes (Signed)
Patient unaccompanied today. Withdrawn. Juvenile speech pattern noted. Reports persistent nausea and vomiting. Prescribed decadron 8 mg bid but, denies that she has taken any or even picked up the medication from the pharmacy. Repeats over and over that she does not want to have any more chemotherapy. Attempted to reassure patient. Denies vision or hearing changes. Shuffling gait noted. Denies headache today but, reports dizziness.

## 2013-01-21 NOTE — Progress Notes (Signed)
See progress note under physician encounter. 

## 2013-01-24 ENCOUNTER — Telehealth: Payer: Self-pay | Admitting: Radiation Oncology

## 2013-01-24 ENCOUNTER — Encounter: Payer: Self-pay | Admitting: *Deleted

## 2013-01-24 NOTE — Telephone Encounter (Signed)
Proof that patient presented for 01/21/2013 4pm appointment faxed to [facility] (fax number [phone]).

## 2013-01-25 ENCOUNTER — Ambulatory Visit: Payer: Medicare HMO | Admitting: Nurse Practitioner

## 2013-01-25 NOTE — Addendum Note (Signed)
Encounter addended by: Agnes Lawrence, RN on: 01/25/2013  9:16 AM<BR>     Documentation filed: Charges VN

## 2013-01-26 ENCOUNTER — Ambulatory Visit: Payer: Medicare HMO

## 2013-01-26 ENCOUNTER — Telehealth: Payer: Self-pay | Admitting: *Deleted

## 2013-01-26 ENCOUNTER — Other Ambulatory Visit: Payer: Medicare HMO

## 2013-01-26 NOTE — Telephone Encounter (Signed)
Left VM requesting patient call office to reschedule her missed visit yesterday. Need to see her and talk about her treatment plan. Thus far today, she has not come in for her chemo appointment today.

## 2013-01-31 ENCOUNTER — Telehealth: Payer: Self-pay | Admitting: *Deleted

## 2013-01-31 NOTE — Telephone Encounter (Signed)
Call from pt's son Zollie Beckers stating pt needs hospice. He reports she is requiring assistance at night. Having nausea and vomiting. Stated pt has been "passing out". He is concerned because he and his brother live out of town and pt has nobody to stay with her at night. Hospice teaching clarified for [facility]. Informed him that hospice is unable to send someone out to stay with pt every night. Recommended he contact local agencies for a sitter/ aide to stay with pt at night. He requested I call his wife, Gabriel Rung to discuss this.

## 2013-01-31 NOTE — Telephone Encounter (Signed)
Left message on voicemail for Monique with the info given to [place] earlier.

## 2013-01-31 NOTE — Progress Notes (Signed)
  Radiation Oncology         [phone] ________________________________  Name: [name] [name] MRN: [id number]  Date: 02/01/2013  DOB: 1946-03-10  SIMULATION AND TREATMENT PLANNING NOTE  DIAGNOSIS:  67 yo woman with a solitary cystic and rim-enhancing left posterior hemisphere brain metastasis measuring 26 x 40 x 35 mm from metastatic breast cancer  NARRATIVE:  The patient was brought to the CT Simulation planning suite.  Identity was confirmed.  All relevant records and images related to the planned course of therapy were reviewed.  The patient freely provided informed written consent to proceed with treatment after reviewing the details related to the planned course of therapy. The consent form was witnessed and verified by the simulation staff. Intravenous access was established for contrast administration. Then, the patient was set-up in a stable reproducible supine position for radiation therapy.  A relocatable thermoplastic stereotactic head frame was fabricated for precise immobilization.  CT images were obtained.  Surface markings were placed.  The CT images were loaded into the planning software and fused with the patient's targeting MRI scan.  Then the target and avoidance structures were contoured.  Treatment planning then occurred.  The radiation prescription was entered and confirmed.  I have requested 3D planning  I have requested a DVH of the following structures: Brain stem, brain, left eye, right I, lenses, optic chiasm, target volumes, uninvolved brain, and normal tissue.    She experienced tremendous anxiety during the simulation process but was able to complete the planning with 1 mg of Ativan  PLAN:  The patient will receive 15 Gy in 1 fraction.  ________________________________  Artist Pais Kathrynn Running, M.D.

## 2013-02-01 ENCOUNTER — Other Ambulatory Visit: Payer: Self-pay | Admitting: Radiation Oncology

## 2013-02-01 ENCOUNTER — Ambulatory Visit
Admission: RE | Admit: 2013-02-01 | Discharge: 2013-02-01 | Disposition: A | Discharge status: Home / Self Care | Payer: Medicare HMO | Source: Ambulatory Visit | Attending: Radiation Oncology | Admitting: Radiation Oncology

## 2013-02-01 ENCOUNTER — Telehealth: Payer: Self-pay | Admitting: *Deleted

## 2013-02-01 DIAGNOSIS — C50919 Malignant neoplasm of unspecified site of unspecified female breast: Secondary | ICD-10-CM | POA: Insufficient documentation

## 2013-02-01 DIAGNOSIS — C50912 Malignant neoplasm of unspecified site of left female breast: Secondary | ICD-10-CM

## 2013-02-01 DIAGNOSIS — G43909 Migraine, unspecified, not intractable, without status migrainosus: Secondary | ICD-10-CM

## 2013-02-01 DIAGNOSIS — M545 Low back pain: Secondary | ICD-10-CM

## 2013-02-01 DIAGNOSIS — C7931 Secondary malignant neoplasm of brain: Secondary | ICD-10-CM

## 2013-02-01 DIAGNOSIS — Z510 Encounter for antineoplastic radiation therapy: Secondary | ICD-10-CM | POA: Insufficient documentation

## 2013-02-01 DIAGNOSIS — C7951 Secondary malignant neoplasm of bone: Secondary | ICD-10-CM

## 2013-02-01 MED ORDER — LORAZEPAM 1 MG PO TABS
1.0000 mg | ORAL_TABLET | Freq: Once | ORAL | Status: AC
  Administered 2013-02-01: 1 mg via ORAL
  Filled 2013-02-01: qty 1

## 2013-02-01 MED ORDER — LORAZEPAM 1 MG PO TABS: 1.0000 mg | ORAL_TABLET | Freq: Three times a day (TID) | ORAL | Status: DC

## 2013-02-01 NOTE — Addendum Note (Signed)
Encounter addended by: Agnes Lawrence, RN on: 02/01/2013  3:20 PM<BR>     Documentation filed: Notes Section

## 2013-02-01 NOTE — Telephone Encounter (Signed)
Call from pt's daughter in law asking what stage her cancer is. Informed her she has metastatic cancer. She stated they heard from other family members that it has spread to her brain. Confirmed for [name] [name] that pt is being set up for radiation to treat a brain lesion. Strongly urged she have pt's sons accompany her to next appt as treatment decisions will need to be made. She voiced understanding and appreciation for call.

## 2013-02-01 NOTE — Progress Notes (Signed)
Patient reports that the ativan helped to calm this patient enough to complete her simulation.

## 2013-02-02 ENCOUNTER — Ambulatory Visit: Payer: Medicare HMO

## 2013-02-02 ENCOUNTER — Telehealth: Payer: Self-pay | Admitting: Radiation Oncology

## 2013-02-02 NOTE — Telephone Encounter (Signed)
Per Dr. Broadus John order phoned in Ativan 1 mg, take one tablet by mouth every eight hours prn anxiety or nausea, qty 45, and no refills. Phoned patient on her cell making her aware this has been done. She expressed understanding and appreciation.

## 2013-02-03 ENCOUNTER — Encounter: Payer: Self-pay | Admitting: Radiation Oncology

## 2013-02-03 ENCOUNTER — Ambulatory Visit
Admission: RE | Admit: 2013-02-03 | Discharge: 2013-02-03 | Disposition: A | Discharge status: Home / Self Care | Payer: Medicare HMO | Source: Ambulatory Visit | Attending: Radiation Oncology | Admitting: Radiation Oncology

## 2013-02-03 VITALS — BP 110/72 | HR 90 | Temp 97.8°F | Resp 16

## 2013-02-03 DIAGNOSIS — C7931 Secondary malignant neoplasm of brain: Secondary | ICD-10-CM

## 2013-02-03 NOTE — Progress Notes (Signed)
Patient resting supine in geri chair. Denies pain at this time. Denies headache, dizziness, nausea and has not vomiting. Patient unable to recall Decadron dosage. She confirms she is taking steroids. Assumption is that she continues to take Decadron 8 mg bid. Vitals stable. No complaints. Encouraged to go home, rest, and call us with needs. Follow up appointment card given. Patient verbalized understanding of all reviewed. Escorted patient to lobby and onto [place] to be transported home.

## 2013-02-03 NOTE — Op Note (Signed)
Stereotactic Radiosurgery Operative Note  Name: [name] [name] MRN: [id number]  Date: 02/03/2013  DOB: 1945/11/25  Op Note  Pre Operative Diagnosis:  Brain metastasis    Post Operative Diagnois:  Brain metastasis  3D TREATMENT PLANNING AND DOSIMETRY:  The patient's radiation plan was reviewed and approved by myself (neurosurgery) and Dr. Oneita Hurt, MD (radiation oncology) prior to treatment.  It showed 3-dimensional radiation distributions overlaid onto the planning CT/MRI image set.  The [facility] for the target structures as well as the organs at risk were reviewed. The documentation of the 3D plan and dosimetry are filed in the radiation oncology EMR.  NARRATIVE:  [name] [name] was brought to the TrueBeam stereotactic radiation treatment machine and placed supine on the CT couch. The head frame was applied, and the patient was set up for stereotactic radiosurgery.  I was present for the set-up and delivery.  SIMULATION VERIFICATION:  In the couch zero-angle position, the patient underwent Exactrac imaging using the Brainlab system with orthogonal KV images.  These were carefully aligned and repeated to confirm treatment position for each of the isocenters.  The Exactrac snap film verification was repeated at each couch angle.  SPECIAL TREATMENT PROCEDURE: [name] [name] received stereotactic radiosurgery to the following targets:  Left parietal 4 cm target was treated using 2 Dynamic Conformal Arcs to a prescription dose of 15 Gy. ExacTrac registration was performed for each couch angle. The 80% isodose line was prescribed.  STEREOTACTIC TREATMENT MANAGEMENT:  Following delivery, the patient was transported to nursing in stable condition and monitored for possible acute effects.  Vital signs were recorded There were no vitals taken for this visit.. The patient tolerated treatment without significant acute effects, and was discharged to home in stable condition.    PLAN: Follow-up in one  month.

## 2013-02-03 NOTE — Progress Notes (Signed)
  Radiation Oncology         [phone]) [phone] ________________________________  Stereotactic Treatment Procedure Note  Name: [name] [name] MRN: [id number]  Date: 02/03/2013  DOB: [birth year]  SPECIAL TREATMENT PROCEDURE  3D TREATMENT PLANNING AND DOSIMETRY:  The patient's radiation plan was reviewed and approved by neurosurgery and radiation oncology prior to treatment.  It showed 3-dimensional radiation distributions overlaid onto the planning CT/MRI image set.  The [facility] for the target structures as well as the organs at risk were reviewed. The documentation of the 3D plan and dosimetry are filed in the radiation oncology EMR.  NARRATIVE:  [name] [name] was brought to the TrueBeam stereotactic radiation treatment machine and placed supine on the CT couch. The head frame was applied, and the patient was set up for stereotactic radiosurgery.  Dr. Newell Coral from neurosurgery was present for the set-up and delivery  SIMULATION VERIFICATION:  In the couch zero-angle position, the patient underwent Exactrac imaging using the Brainlab system with orthogonal KV images.  These were carefully aligned and repeated to confirm treatment position for each of the isocenters.  The Exactrac snap film verification was repeated at each couch angle.  SPECIAL TREATMENT PROCEDURE: [name] [name] received stereotactic radiosurgery to the following targets:  Left parietal 4 cm target was treated using 2 Dynamic Conformal Arcs to a prescription dose of 15 Gy.  ExacTrac registration was performed for each couch angle.  The 80% isodose line was prescribed.  STEREOTACTIC TREATMENT MANAGEMENT:  Following delivery, the patient was transported to nursing in stable condition and monitored for possible acute effects.  Vital signs were recorded BP 110/72  Pulse 90  Temp(Src) 97.8 F (36.6 C) (Oral)  Resp 16  SpO2 96%. The patient tolerated treatment without significant acute effects, and was discharged to home in stable  condition.    PLAN: Follow-up in one month.  ________________________________  Artist Pais. Kathrynn Running, M.D.

## 2013-02-03 NOTE — Progress Notes (Signed)
Escorted patient from linac 1 to exam room 1 for one hour nurse monitoring following srs treatment. Patient very anxious and tearful upon arrival. Patient unaccompanied. Patient took one ativan 1 mg tablet as prescribed upon arrival. Patient less anxious now. Patient alert and oriented to person, place, and time. Patient's demeanor very childlike but, no confusion noted. Denies nausea, vomiting, headache or dizziness. Provided patient with sprite and a snack. Denies pain at this time. Without complaints. Will continue to monitor patient closely.

## 2013-02-04 NOTE — Addendum Note (Signed)
Encounter addended by: Bernell List on: 02/04/2013 12:22 PM<BR>     Documentation filed: Charges VN

## 2013-02-06 NOTE — Progress Notes (Signed)
°  Radiation Oncology         [phone] ________________________________  Name: ARRIA NAIM MRN: [id number]  Date: 02/03/2013  DOB: 1946-05-22  End of Treatment Note  Diagnosis:   67 yo woman with a solitary cystic and rim-enhancing left posterior hemisphere brain metastasis measuring 26 x 40 x 35 mm from metastatic breast cancer  Indication for treatment:  palliation       Radiation treatment dates:   02/03/2013  Site/dose:   Left parietal 4 cm target was treated using 2 Dynamic Conformal Arcs to a prescription dose of 15 Gy. ExacTrac registration was performed for each couch angle. The 80% isodose line was prescribed.  Beams/energy:   6 megavolt photons were delivered in the flattening filter free beam mode  Narrative: The patient tolerated radiation treatment relatively well.   She had some anxiety related to radiation,, but treatment was delivered uneventfully.  Plan: The patient has completed radiation treatment. The patient will return to radiation oncology clinic for routine followup in one month. I advised them to call or return sooner if they have any questions or concerns related to their recovery or treatment. ________________________________  Artist Pais. Kathrynn Running, M.D.

## 2013-02-07 ENCOUNTER — Encounter: Payer: Self-pay | Admitting: *Deleted

## 2013-02-09 ENCOUNTER — Encounter (HOSPITAL_COMMUNITY): Payer: Self-pay | Admitting: Emergency Medicine

## 2013-02-09 ENCOUNTER — Ambulatory Visit: Payer: Medicare HMO | Admitting: Oncology

## 2013-02-09 ENCOUNTER — Other Ambulatory Visit: Payer: Medicare HMO | Admitting: Lab

## 2013-02-09 ENCOUNTER — Inpatient Hospital Stay (HOSPITAL_COMMUNITY)
Admission: EM | Admit: 2013-02-09 | Discharge: 2013-02-12 | DRG: 378 | Disposition: A | Discharge status: Home / Self Care | Payer: Medicare HMO | Attending: Internal Medicine | Admitting: Internal Medicine

## 2013-02-09 ENCOUNTER — Emergency Department (HOSPITAL_COMMUNITY): Payer: Medicare HMO

## 2013-02-09 ENCOUNTER — Ambulatory Visit: Payer: Medicare HMO

## 2013-02-09 DIAGNOSIS — C7951 Secondary malignant neoplasm of bone: Secondary | ICD-10-CM | POA: Diagnosis present

## 2013-02-09 DIAGNOSIS — Z9221 Personal history of antineoplastic chemotherapy: Secondary | ICD-10-CM

## 2013-02-09 DIAGNOSIS — D649 Anemia, unspecified: Secondary | ICD-10-CM | POA: Diagnosis present

## 2013-02-09 DIAGNOSIS — Z170 Estrogen receptor positive status [ER+]: Secondary | ICD-10-CM

## 2013-02-09 DIAGNOSIS — C50919 Malignant neoplasm of unspecified site of unspecified female breast: Secondary | ICD-10-CM | POA: Diagnosis present

## 2013-02-09 DIAGNOSIS — M545 Low back pain, unspecified: Secondary | ICD-10-CM | POA: Diagnosis present

## 2013-02-09 DIAGNOSIS — Z96659 Presence of unspecified artificial knee joint: Secondary | ICD-10-CM

## 2013-02-09 DIAGNOSIS — K253 Acute gastric ulcer without hemorrhage or perforation: Secondary | ICD-10-CM

## 2013-02-09 DIAGNOSIS — G8929 Other chronic pain: Secondary | ICD-10-CM | POA: Diagnosis present

## 2013-02-09 DIAGNOSIS — C7931 Secondary malignant neoplasm of brain: Secondary | ICD-10-CM | POA: Diagnosis present

## 2013-02-09 DIAGNOSIS — E43 Unspecified severe protein-calorie malnutrition: Secondary | ICD-10-CM | POA: Insufficient documentation

## 2013-02-09 DIAGNOSIS — L98499 Non-pressure chronic ulcer of skin of other sites with unspecified severity: Secondary | ICD-10-CM | POA: Diagnosis present

## 2013-02-09 DIAGNOSIS — E785 Hyperlipidemia, unspecified: Secondary | ICD-10-CM | POA: Diagnosis present

## 2013-02-09 DIAGNOSIS — K254 Chronic or unspecified gastric ulcer with hemorrhage: Principal | ICD-10-CM | POA: Diagnosis present

## 2013-02-09 DIAGNOSIS — G43909 Migraine, unspecified, not intractable, without status migrainosus: Secondary | ICD-10-CM | POA: Diagnosis present

## 2013-02-09 DIAGNOSIS — C7952 Secondary malignant neoplasm of bone marrow: Secondary | ICD-10-CM | POA: Diagnosis present

## 2013-02-09 DIAGNOSIS — I10 Essential (primary) hypertension: Secondary | ICD-10-CM | POA: Diagnosis present

## 2013-02-09 DIAGNOSIS — I82619 Acute embolism and thrombosis of superficial veins of unspecified upper extremity: Secondary | ICD-10-CM | POA: Diagnosis not present

## 2013-02-09 DIAGNOSIS — N390 Urinary tract infection, site not specified: Secondary | ICD-10-CM | POA: Diagnosis present

## 2013-02-09 DIAGNOSIS — R112 Nausea with vomiting, unspecified: Secondary | ICD-10-CM | POA: Diagnosis present

## 2013-02-09 DIAGNOSIS — R109 Unspecified abdominal pain: Secondary | ICD-10-CM

## 2013-02-09 DIAGNOSIS — Z66 Do not resuscitate: Secondary | ICD-10-CM | POA: Diagnosis not present

## 2013-02-09 DIAGNOSIS — I82A19 Acute embolism and thrombosis of unspecified axillary vein: Secondary | ICD-10-CM | POA: Diagnosis not present

## 2013-02-09 DIAGNOSIS — J45909 Unspecified asthma, uncomplicated: Secondary | ICD-10-CM | POA: Diagnosis present

## 2013-02-09 DIAGNOSIS — D62 Acute posthemorrhagic anemia: Secondary | ICD-10-CM | POA: Diagnosis present

## 2013-02-09 DIAGNOSIS — Z9010 Acquired absence of unspecified breast and nipple: Secondary | ICD-10-CM

## 2013-02-09 DIAGNOSIS — Z853 Personal history of malignant neoplasm of breast: Secondary | ICD-10-CM

## 2013-02-09 DIAGNOSIS — F17200 Nicotine dependence, unspecified, uncomplicated: Secondary | ICD-10-CM | POA: Diagnosis present

## 2013-02-09 LAB — BASIC METABOLIC PANEL
BUN: 10 mg/dL (ref 6–23)
CO2: 29 mEq/L (ref 19–32)
Chloride: 103 mEq/L (ref 96–112)
Creatinine, Ser: 0.76 mg/dL (ref 0.50–1.10)
GFR calc Af Amer: >90 mL/min (ref >90)
Potassium: 3.3 mEq/L — ABNORMAL LOW (ref 3.5–5.1)

## 2013-02-09 LAB — HEPATIC FUNCTION PANEL
ALT: 6 U/L (ref 0–35)
AST: 7 U/L (ref 0–37)
Alkaline Phosphatase: 57 U/L (ref 39–117)
Bilirubin, Direct: <0.1 mg/dL (ref 0.0–0.3)
Total Protein: 5.7 g/dL — ABNORMAL LOW (ref 6.0–8.3)

## 2013-02-09 LAB — CBC
Hemoglobin: 7.8 g/dL — ABNORMAL LOW (ref 12.0–15.0)
RBC: 2.67 MIL/uL — ABNORMAL LOW (ref 3.87–5.11)

## 2013-02-09 LAB — URINALYSIS, ROUTINE W REFLEX MICROSCOPIC
Bilirubin Urine: NEGATIVE
Hgb urine dipstick: NEGATIVE
Ketones, ur: NEGATIVE mg/dL
Specific Gravity, Urine: 1.021 (ref 1.005–1.030)
pH: 5.5 (ref 5.0–8.0)

## 2013-02-09 LAB — CBC WITH DIFFERENTIAL/PLATELET
Basophils Relative: 0 % (ref 0–1)
HCT: 16 % — ABNORMAL LOW (ref 36.0–46.0)
Hemoglobin: 5.1 g/dL — CL (ref 12.0–15.0)
Lymphocytes Relative: 17 % (ref 12–46)
Monocytes Absolute: 0.7 10*3/uL (ref 0.1–1.0)
Monocytes Relative: 10 % (ref 3–12)
Neutro Abs: 5.1 10*3/uL (ref 1.7–7.7)
Neutrophils Relative %: 72 % (ref 43–77)
RBC: 1.8 MIL/uL — ABNORMAL LOW (ref 3.87–5.11)
WBC: 7.1 10*3/uL (ref 4.0–10.5)

## 2013-02-09 LAB — PREPARE RBC (CROSSMATCH)

## 2013-02-09 LAB — OCCULT BLOOD, POC DEVICE: Fecal Occult Bld: NEGATIVE

## 2013-02-09 LAB — URINE MICROSCOPIC-ADD ON

## 2013-02-09 MED ORDER — SENNA 8.6 MG PO TABS
1.0000 | ORAL_TABLET | Freq: Two times a day (BID) | ORAL | Status: DC
  Administered 2013-02-09 – 2013-02-10 (×2): 8.6 mg via ORAL
  Filled 2013-02-09 (×8): qty 1

## 2013-02-09 MED ORDER — SODIUM CHLORIDE 0.9 % IV SOLN
INTRAVENOUS | Status: DC
  Administered 2013-02-09 – 2013-02-12 (×4): via INTRAVENOUS

## 2013-02-09 MED ORDER — MORPHINE SULFATE 15 MG PO TABS: 15.0000 mg | ORAL_TABLET | ORAL | Status: DC | PRN

## 2013-02-09 MED ORDER — DOCUSATE SODIUM 100 MG PO CAPS
100.0000 mg | ORAL_CAPSULE | Freq: Two times a day (BID) | ORAL | Status: DC
  Administered 2013-02-09 – 2013-02-10 (×2): 100 mg via ORAL
  Filled 2013-02-09 (×8): qty 1

## 2013-02-09 MED ORDER — ALBUTEROL SULFATE HFA 108 (90 BASE) MCG/ACT IN AERS
2.0000 | INHALATION_SPRAY | Freq: Four times a day (QID) | RESPIRATORY_TRACT | Status: DC | PRN
  Filled 2013-02-09: qty 6.7

## 2013-02-09 MED ORDER — SODIUM CHLORIDE 0.9 % IV BOLUS (SEPSIS)
500.0000 mL | Freq: Once | INTRAVENOUS | Status: AC
  Administered 2013-02-09: 500 mL via INTRAVENOUS

## 2013-02-09 MED ORDER — HYDROMORPHONE HCL PF 1 MG/ML IJ SOLN: 1.0000 mg | Freq: Once | INTRAMUSCULAR | Status: DC

## 2013-02-09 MED ORDER — SODIUM CHLORIDE 0.9 % IJ SOLN
3.0000 mL | Freq: Two times a day (BID) | INTRAMUSCULAR | Status: DC
  Administered 2013-02-09: 3 mL via INTRAVENOUS

## 2013-02-09 MED ORDER — HYDROMORPHONE HCL PF 1 MG/ML IJ SOLN
0.5000 mg | Freq: Once | INTRAMUSCULAR | Status: AC
  Administered 2013-02-09: 0.5 mg via INTRAVENOUS
  Filled 2013-02-09: qty 1

## 2013-02-09 MED ORDER — PROCHLORPERAZINE EDISYLATE 5 MG/ML IJ SOLN
10.0000 mg | Freq: Once | INTRAMUSCULAR | Status: AC
  Administered 2013-02-09: 10 mg via INTRAVENOUS
  Filled 2013-02-09: qty 2

## 2013-02-09 MED ORDER — POTASSIUM CHLORIDE CRYS ER 20 MEQ PO TBCR
40.0000 meq | EXTENDED_RELEASE_TABLET | Freq: Once | ORAL | Status: AC
  Administered 2013-02-09: 40 meq via ORAL
  Filled 2013-02-09: qty 2

## 2013-02-09 MED ORDER — HYDROMORPHONE HCL PF 1 MG/ML IJ SOLN: 1.0000 mg | INTRAMUSCULAR | Status: DC | PRN

## 2013-02-09 MED ORDER — ONDANSETRON HCL 4 MG/2ML IJ SOLN: 4.0000 mg | Freq: Three times a day (TID) | INTRAMUSCULAR | Status: AC | PRN

## 2013-02-09 MED ORDER — ONDANSETRON HCL 4 MG/2ML IJ SOLN
4.0000 mg | Freq: Once | INTRAMUSCULAR | Status: AC
  Administered 2013-02-09: 4 mg via INTRAVENOUS

## 2013-02-09 NOTE — ED Notes (Signed)
Pt requesting something to eat.  

## 2013-02-09 NOTE — ED Notes (Signed)
Pt placed on a bedpan. Pt able to lift hips independently.

## 2013-02-09 NOTE — ED Notes (Signed)
Per EMS: Pt from home.  C/o NV, gen abd pain x "a couple wks".  Breast CA pt, stopped chemo due to side effects.  Had a follow up appt at cancer center but was unable to go during transportation.  EMS states that she "spit up" in the ambulance, no vomit.  Lt mastectomy.

## 2013-02-09 NOTE — ED Provider Notes (Signed)
Medical screening examination/treatment/procedure(s) were performed by non-physician practitioner and as supervising physician I was immediately available for consultation/collaboration.   Charles B. Sheldon, MD 02/09/13 2139 

## 2013-02-09 NOTE — ED Notes (Signed)
Patient transported to X-ray 

## 2013-02-09 NOTE — Progress Notes (Signed)
Patient declines MyChart activation-does not have a computer 

## 2013-02-09 NOTE — ED Notes (Signed)
Zavitt, MD, made aware of patient's temperature. States that no intervention needed.

## 2013-02-09 NOTE — ED Provider Notes (Signed)
CSN: [id number]     Arrival date & time 02/09/13  1049 History     First MD Initiated Contact with Patient 02/09/13 1104     Chief Complaint  Patient presents with  . Nausea  . Emesis   (Consider location/radiation/quality/duration/timing/severity/associated sxs/prior Treatment) HPI  [name] [name] is a 67 y.o. female  with PMH significant for metastatic Breast Ca  undergoing palliative radiation complaining of nonbloody, nonbilious, no coffee ground emesis and diffuse abdominal pain going on for 7 days, significantly worsening over the course of the last 48 hours. Pain is described as severe, is exacerbated by laying supine, described as aching, she is passing a normal amount of stool. Patient denies fever, melena, hematochezia, chest pain, shortness of breath, palpitations. Last chemotherapy was approximately 2-3 weeks ago, she DC'd it due to side effects .    Past Medical History  Diagnosis Date  . Cancer 1996    breast  . Hyperlipidemia   . Hypertension   . Asthma     best PEF=400  . 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 Chronic low back pain     MRI 2010 showed right sided disease with paracentral  disc protusion at L5-S1 which contacts and displaces the right S1 nerve root within the lateral recess  . Migraine headache     MRI 11/18/10: Mild progression of prominent white matter type changes which may be related to a small vessel disease and / or migraine headaches.Other causes for white matter type changes such as that secondaryto; vasculitis, inflammatory process or demyelinating process feltto be secondary considerations.  . Leg swelling   . Neuralgia and neuritis   . Tenosynovitis     left, MRI 05/2003  . Herniated disc     h/o ruptured disk, laminectomy L4-L5, Dr. Montez Morita  . Breast cancer 1996, 2001    f/b Dr. Myrle Sheng. s/p masectomy +radation. w/ metastatic disease, now on Femara.   . Tobacco abuse   . Hx of radiation therapy 06/21/02 -07/13/02    T1-T6  . Bone metastases     breast primary, T spine  . Shortness of breath     exertion  . Hx of migraines   . Hx of radiation therapy 04/21/12    T3-T4 spinal SRS  . Breast cancer metastasized to multiple sites 07/12/2012  . Brain cancer     new 4 cm brain met   Past Surgical History  Procedure Laterality Date  . Abdominal hysterectomy    . Cesarean section    . Mastectomy      left  . Laminectomy      L4-L5  . Joint replacement      Left Knee  . Back surgery    . Breast surgery    . Amputation  11/26/2011    Procedure: AMPUTATION DIGIT;  Surgeon: Kennieth Rad, MD;  Location: [facility] OR;  Service: Orthopedics;  Laterality: Bilateral;  PHALANGECTOMY  - Right fifth toe and left third toe  . Rotator cuff repair      pt does not remember year of surgery  . Thoracic laminectomy  04/01/12    T 2-4-metastatic tumor resection   Family History  Problem Relation Age of Onset  . Anesthesia problems Neg Hx   . Hypotension Neg Hx   . Malignant hyperthermia Neg Hx   . Pseudochol deficiency Neg Hx  History  Substance Use Topics  . Smoking status: Current Every Day Smoker -- 0.50 packs/day for 50 years    Types: Cigarettes  . Smokeless tobacco: Never Used     Comment: smoking a little less  . Alcohol Use: No   OB History   Grav Para Term Preterm Abortions TAB SAB Ect Mult Living                 Review of Systems  10 systems reviewed and found to be negative, except as noted in the HPI   Allergies  Banana  Home Medications   Current Outpatient Rx  Name  Route  Sig  Dispense  Refill  . albuterol (PROVENTIL HFA;VENTOLIN HFA) 108 (90 BASE) MCG/ACT inhaler   Inhalation   Inhale 2 puffs into the lungs every 6 (six) hours as needed. Shortness of breath   18 g   5   . dexamethasone (DECADRON) 4 MG tablet      Take 2 tablets (8 mg) twice a day   60 tablet   0   . loperamide (IMODIUM) 2 MG capsule    Oral   Take 1 capsule (2 mg total) by mouth 4 (four) times daily as needed for diarrhea or loose stools.   30 capsule   0   . LORazepam (ATIVAN) 1 MG tablet   Oral   Take 1 tablet (1 mg total) by mouth every 8 (eight) hours.   45 tablet   0   . morphine (MSIR) 15 MG tablet   Oral   Take 15-30 mg by mouth every 4 (four) hours as needed for pain.         [name] ondansetron (ZOFRAN) 8 MG tablet   Oral   Take 1 tablet (8 mg total) by mouth every 8 (eight) hours as needed for nausea.   30 tablet   2   . prochlorperazine (COMPAZINE) 10 MG tablet   Oral   Take 10 mg by mouth every 6 (six) hours as needed. Nausea         . promethazine (PHENERGAN) 12.5 MG tablet   Oral   Take 1 tablet (12.5 mg total) by mouth every 6 (six) hours as needed for nausea.   30 tablet   0   . SUMAtriptan (IMITREX) 50 MG tablet   Oral   Take 50 mg by mouth every 2 (two) hours as needed for migraine.          BP 120/55  Pulse 95  Temp(Src) 98.9 F (37.2 C) (Oral)  Resp 25  SpO2 93% Physical Exam  Nursing note and vitals reviewed. Constitutional: She is oriented to person, place, and time. She appears well-developed and well-nourished. No distress.  Agitated crying, states that she is tired of being sick  HENT:  Head: Normocephalic.  Mouth/Throat: Oropharynx is clear and moist.  Eyes: Conjunctivae and EOM are normal. Pupils are equal, round, and reactive to light.  Neck: Normal range of motion.  Cardiovascular: Normal rate and regular rhythm.   Pulmonary/Chest: Effort normal. No stridor. No respiratory distress. She has no wheezes. She has no rales. She exhibits no tenderness.  Abdominal: Soft. She exhibits no distension and no mass. There is tenderness. There is no rebound and no guarding.  Diffusely tender to palpation with no guarding or rebound  Musculoskeletal: Normal range of motion. She exhibits no edema.  Neurological: She is alert and oriented to person, place, and time.   Psychiatric: She has a normal mood  and affect.    ED Course   Procedures (including critical care time)  CRITICAL CARE Performed by: Wynetta Emery   Total critical care time: 40  Critical care time was exclusive of separately billable procedures and treating other patients.  Critical care was necessary to treat or prevent imminent or life-threatening deterioration.  Critical care was time spent personally by me on the following activities: development of treatment plan with patient and/or surrogate as well as nursing, discussions with consultants, evaluation of patient's response to treatment, examination of patient, obtaining history from patient or surrogate, ordering and performing treatments and interventions, ordering and review of laboratory studies, ordering and review of radiographic studies, pulse oximetry and re-evaluation of patient's condition.   Labs Reviewed  CBC WITH DIFFERENTIAL - Abnormal; Notable for the following:    RBC 1.80 (*)    Hemoglobin 5.1 (*)    HCT 16.0 (*)    RDW 15.9 (*)    All other components within normal limits  BASIC METABOLIC PANEL - Abnormal; Notable for the following:    Potassium 3.3 (*)    Glucose, Bld 101 (*)    Calcium 8.3 (*)    GFR calc non Af Amer 84 (*)    All other components within normal limits  HEPATIC FUNCTION PANEL - Abnormal; Notable for the following:    Total Protein 5.7 (*)    Albumin 2.2 (*)    Total Bilirubin 0.2 (*)    All other components within normal limits  URINALYSIS, ROUTINE W REFLEX MICROSCOPIC - Abnormal; Notable for the following:    APPearance CLOUDY (*)    Leukocytes, UA MODERATE (*)    All other components within normal limits  URINE MICROSCOPIC-ADD ON - Abnormal; Notable for the following:    Bacteria, UA MANY (*)    All other components within normal limits  URINE CULTURE  LIPASE, BLOOD  OCCULT BLOOD GASTRIC / DUODENUM (SPECIMEN CUP)  OCCULT BLOOD, POC DEVICE  PREPARE RBC (CROSSMATCH)   TYPE AND SCREEN  ABO/RH   Ct Head Wo Contrast  02/09/2013   *RADIOLOGY REPORT*  Clinical Data: CVA headache, history of breast cancer  CT HEAD WITHOUT CONTRAST  Technique:  Contiguous axial images were obtained from the base of the skull through the vertex without contrast.  Comparison: 01/19/2013  Findings: No intracranial hemorrhage, mass effect or midline shift. Solitary cystic lesion in the posterior aspect of the left hemisphere again noted measures 4 by 2.4 cm stable in size and appearance from prior exam.  Patchy subcortical white matter decreased attenuation consistent with small vessel ischemic changes again noted.  No acute cortical infarction.  No intraventricular hemorrhage.  IMPRESSION: No significant change.Solitary cystic lesion in the posterior aspect of the left hemisphere again noted measures 4 by 2.4 cm stable in size and appearance from prior exam.  Patchy subcortical white matter decreased attenuation consistent with small vessel ischemic changes again noted.  No acute cortical infarction.  No intraventricular hemorrhage.   Original Report Authenticated By: Natasha Mead, M.D.   Dg Abd Acute W/chest  02/09/2013   *RADIOLOGY REPORT*  Clinical Data: Nausea vomiting.  Abdominal pain.  History breast cancer and asthma.  ACUTE ABDOMEN SERIES (ABDOMEN 2 VIEW & CHEST 1 VIEW)  Comparison: 02/23/2012 chest film.  Findings: Frontal view of the chest demonstrates surgical clips about the left axilla.  Left mastectomy.  Right-sided Port-A-Cath which terminates at the mid to low SVC.  Upper thoracic spine fixation.  Left rotator cuff repair.  Moderate  left hemidiaphragm elevation. No pleural effusion or pneumothorax.  Mild tracheal deviation to the right, positional.  Normal heart size.  Left superior mediastinal and adjacent medial left upper lobe soft tissue fullness, grossly similar to the 02/23/2012 exam.  Volume loss at the left lung base.  Abominal films demonstrate no free intraperitoneal air or  significant air fluid levels on upright positioning.  No bowel distention. Distal gas and stool.  IMPRESSION: No acute findings in the abdomen/pelvis.  Similar superior left sided mediastinal and adjacent upper lobe soft tissue fullness.  Incompletely characterized.  Similar left hemidiaphragm elevation.   Original Report Authenticated By: Jeronimo Greaves, M.D.   1. Anemia     MDM   Filed Vitals:   02/09/13 1305  BP: 120/55  Pulse: 95  Temp: 98.9 F (37.2 C)  TempSrc: Oral  Resp: 25  SpO2: 93%     [name] [name] is a 67 y.o. female with metastatic breast cancer, undergoing palliative care. Patient coming in with several weeks of nausea vomiting diffuse abdominal pain. Abdominal exam is nonsurgical. Patient is found to have a hemoglobin of 5.1. Guaiac negative. She's not on any blood thinners. Last CBC 2 weeks ago showed a relatively normal H&H of 10/31.   Consult from internal medicine doctor Clyde Lundborg cruciated: He accepts the patient for transfer to [facility]. Attending physician is Dr. Jerolyn Center. Transfusion initiated in the ED. Patient has stable vital signs. I do not think it increases her risk to transfer her.   Medications  sodium chloride 0.9 % bolus 500 mL (500 mL Intravenous New Bag/Given 02/09/13 1200)  ondansetron (ZOFRAN) injection 4 mg (4 mg Intravenous Given 02/09/13 1200)  prochlorperazine (COMPAZINE) injection 10 mg (10 mg Intravenous Given 02/09/13 1250)  HYDROmorphone (DILAUDID) injection 0.5 mg (0.5 mg Intravenous Given 02/09/13 1250)    Note: Portions of this report may have been transcribed using voice recognition software. Every effort was made to ensure accuracy; however, inadvertent computerized transcription errors may be present    Wynetta Emery, PA-C 02/09/13 1627

## 2013-02-09 NOTE — Progress Notes (Signed)
Pt arrived on stretcher via [facility]. Second unit of blood was complete at 2034. Pt's ending vs were bp-130-56, hr-84, t-98.7- o2sat @ 100% on 2l. Pt was put in bed, oriented to room, admissions MD has been paged,and safety video has been watched. Pt in no apparent distress at this time. Awaiting further orders.

## 2013-02-09 NOTE — ED Notes (Signed)
[name] [name], [place], made aware of hemoglobin.

## 2013-02-10 DIAGNOSIS — E43 Unspecified severe protein-calorie malnutrition: Secondary | ICD-10-CM | POA: Insufficient documentation

## 2013-02-10 DIAGNOSIS — R112 Nausea with vomiting, unspecified: Secondary | ICD-10-CM

## 2013-02-10 DIAGNOSIS — D649 Anemia, unspecified: Secondary | ICD-10-CM

## 2013-02-10 LAB — BILIRUBIN, FRACTIONATED(TOT/DIR/INDIR): Bilirubin, Direct: <0.1 mg/dL (ref 0.0–0.3)

## 2013-02-10 LAB — TYPE AND SCREEN
ABO/RH(D): O POS
Unit division: 0

## 2013-02-10 LAB — CBC
HCT: 20.9 % — ABNORMAL LOW (ref 36.0–46.0)
HCT: 17 % — ABNORMAL LOW (ref 36.0–46.0)
Hemoglobin: 5.6 g/dL — CL (ref 12.0–15.0)
Hemoglobin: 9.1 g/dL — ABNORMAL LOW (ref 12.0–15.0)
MCHC: 33 g/dL (ref 30.0–36.0)
MCHC: 33.9 g/dL (ref 30.0–36.0)
MCV: 87.8 fL (ref 78.0–100.0)
MCV: 87.6 fL (ref 78.0–100.0)
MCV: 87.1 fL (ref 78.0–100.0)
Platelets: 342 10*3/uL (ref 150–400)
Platelets: 305 10*3/uL (ref 150–400)
RBC: 2.38 MIL/uL — ABNORMAL LOW (ref 3.87–5.11)
RBC: 3.15 MIL/uL — ABNORMAL LOW (ref 3.87–5.11)
RDW: 16.1 % — ABNORMAL HIGH (ref 11.5–15.5)
RDW: 15.2 % (ref 11.5–15.5)
WBC: 6.7 10*3/uL (ref 4.0–10.5)
WBC: 5.5 10*3/uL (ref 4.0–10.5)
WBC: 6.6 10*3/uL (ref 4.0–10.5)

## 2013-02-10 LAB — PREPARE RBC (CROSSMATCH)

## 2013-02-10 LAB — RETICULOCYTES
RBC.: 2.38 MIL/uL — ABNORMAL LOW (ref 3.87–5.11)
Retic Count, Absolute: 61.9 10*3/uL (ref 19.0–186.0)
Retic Ct Pct: 2.6 % (ref 0.4–3.1)

## 2013-02-10 LAB — IRON AND TIBC
Saturation Ratios: 7 % — ABNORMAL LOW (ref 20–55)
TIBC: 232 ug/dL — ABNORMAL LOW (ref 250–470)

## 2013-02-10 LAB — OCCULT BLOOD X 1 CARD TO LAB, STOOL: Fecal Occult Bld: NEGATIVE

## 2013-02-10 LAB — FERRITIN: Ferritin: 53 ng/mL (ref 10–291)

## 2013-02-10 LAB — BASIC METABOLIC PANEL
BUN: 7 mg/dL (ref 6–23)
CO2: 28 mEq/L (ref 19–32)
Chloride: 106 mEq/L (ref 96–112)
Creatinine, Ser: 0.81 mg/dL (ref 0.50–1.10)

## 2013-02-10 LAB — URINE CULTURE

## 2013-02-10 MED ORDER — BOOST / RESOURCE BREEZE PO LIQD
1.0000 | Freq: Three times a day (TID) | ORAL | Status: DC
  Administered 2013-02-12: 1 via ORAL

## 2013-02-10 MED ORDER — PEG 3350-KCL-NA BICARB-NACL 420 G PO SOLR
4000.0000 mL | Freq: Once | ORAL | Status: AC
  Administered 2013-02-10: 4000 mL via ORAL
  Filled 2013-02-10: qty 4000

## 2013-02-10 MED ORDER — ENSURE COMPLETE PO LIQD
237.0000 mL | Freq: Three times a day (TID) | ORAL | Status: DC
  Administered 2013-02-10 – 2013-02-11 (×4): 237 mL via ORAL

## 2013-02-10 MED ORDER — SODIUM CHLORIDE 0.9 % IV SOLN
INTRAVENOUS | Status: DC
  Administered 2013-02-10: 21:00:00 via INTRAVENOUS
  Administered 2013-02-11: 500 mL via INTRAVENOUS

## 2013-02-10 NOTE — H&P (Signed)
Date: 02/10/2013  Patient name: [name] [name]  Medical record number: [id number]  Date of birth: [birth year]   I have seen and evaluated [name] and discussed their care with the Residency Team.  [phone] F with metastatic breast Ca who presents with anemia, hgb 5.1 down from baseline of 10-11. Had 1 dose of new chemo 2 wks ago but stopped early due to nausea. 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 She received 2 u of RBC since admit and appears to have responded to transfusions.  Physical Exam: Blood pressure 151/81, pulse 95, temperature 99.6 F (37.6 C), temperature source Oral, resp. rate 18, height 5\' 8"  (1.727 m), weight 171 lb (77.565 kg), SpO2 99.00%. Constitutional: She is oriented to person, place, and time. She appears distressed (mildly distressed and moaning when we initially woke her from sleep; became more comfortable as history performed).  HENT:  Head: Normocephalic and atraumatic.  Mouth/Throat: Oropharynx is clear and moist.  Neck: Normal range of motion. Neck supple.  Cardiovascular: Normal rate, regular rhythm, normal heart sounds and intact distal pulses. Exam reveals no gallop and no friction rub.  No murmur heard. No LE edema.  Pulmonary/Chest: Effort normal and breath sounds normal. No respiratory distress. She has no wheezes. She has no rales. She exhibits no tenderness.  Port-a-cath in place. Left mastectomy.  Abdominal: Soft. Bowel sounds are normal. She exhibits no distension and no mass. There is tenderness (tender to palpation in epigastric region). There is no rebound and no guarding.  Musculoskeletal: Normal range of motion.  Neurological: She is alert and oriented to person, place, and time.  Skin: Skin is warm and dry. No rash noted. She is not diaphoretic. No erythema.      Lab results: Results for orders placed during the hospital encounter of 02/09/13 (from the past 24 hour(s))  CBC      Status: Abnormal   Collection Time    02/09/13 11:00 PM      Result Value Range   WBC 8.0  4.0 - 10.5 K/uL   RBC 2.67 (*) 3.87 - 5.11 MIL/uL   Hemoglobin 7.8 (*) 12.0 - 15.0 g/dL   HCT 11.9 (*) 14.7 - 82.9 %   MCV 87.6  78.0 - 100.0 fL   MCH 29.2  26.0 - 34.0 pg   MCHC 33.3  30.0 - 36.0 g/dL   RDW 56.2 (*) 13.0 - 86.5 %   Platelets 348  150 - 400 K/uL  CBC     Status: Abnormal   Collection Time    02/10/13  6:10 AM      Result Value Range   WBC 6.7  4.0 - 10.5 K/uL   RBC 2.38 (*) 3.87 - 5.11 MIL/uL   Hemoglobin 6.9 (*) 12.0 - 15.0 g/dL   HCT 78.4 (*) 69.6 - 29.5 %   MCV 87.8  78.0 - 100.0 fL   MCH 29.0  26.0 - 34.0 pg   MCHC 33.0  30.0 - 36.0 g/dL   RDW 28.4 (*) 13.2 - 44.0 %   Platelets 342  150 - 400 K/uL  BASIC METABOLIC PANEL     Status: Abnormal   Collection Time    02/10/13  6:10 AM      Result Value Range   Sodium 140  135 - 145 mEq/L   Potassium 3.5  3.5 - 5.1 mEq/L   Chloride 106  96 - 112 mEq/L  CO2 28  19 - 32 mEq/L   Glucose, Bld 88  70 - 99 mg/dL   BUN 7  6 - 23 mg/dL   Creatinine, Ser 3.08  0.50 - 1.10 mg/dL   Calcium 8.4  8.4 - 65.7 mg/dL   GFR calc non Af Amer 72 (*) >90 mL/min   GFR calc Af Amer 84 (*) >90 mL/min  BILIRUBIN, FRACTIONATED(TOT/DIR/INDIR)     Status: None   Collection Time    02/10/13  6:10 AM      Result Value Range   Total Bilirubin 0.4  0.3 - 1.2 mg/dL   Bilirubin, Direct <8.4  0.0 - 0.3 mg/dL   Indirect Bilirubin NOT CALCULATED  0.3 - 0.9 mg/dL  RETICULOCYTES     Status: Abnormal   Collection Time    02/10/13  6:10 AM      Result Value Range   Retic Ct Pct 2.6  0.4 - 3.1 %   RBC. 2.38 (*) 3.87 - 5.11 MIL/uL   Retic Count, Manual 61.9  19.0 - 186.0 K/uL  LACTATE DEHYDROGENASE     Status: None   Collection Time    02/10/13  6:10 AM      Result Value Range   LDH 152  94 - 250 U/L  HAPTOGLOBIN     Status: Abnormal   Collection Time    02/10/13  6:10 AM      Result Value Range   Haptoglobin 342 (*) 45 - 215 mg/dL  VITAMIN  O96     Status: None   Collection Time    02/10/13  6:10 AM      Result Value Range   Vitamin B-12 504  211 - 911 pg/mL  FOLATE     Status: None   Collection Time    02/10/13  6:10 AM      Result Value Range   Folate 7.1    IRON AND TIBC     Status: Abnormal   Collection Time    02/10/13  6:10 AM      Result Value Range   Iron 17 (*) 42 - 135 ug/dL   TIBC 295 (*) 284 - 132 ug/dL   Saturation Ratios 7 (*) 20 - 55 %   UIBC 215  125 - 400 ug/dL  FERRITIN     Status: None   Collection Time    02/10/13  6:10 AM      Result Value Range   Ferritin 53  10 - 291 ng/mL  PREPARE RBC (CROSSMATCH)     Status: None   Collection Time    02/10/13  9:05 AM      Result Value Range   Order Confirmation ORDER PROCESSED BY BLOOD BANK    TYPE AND SCREEN     Status: None   Collection Time    02/10/13  9:05 AM      Result Value Range   ABO/RH(D) O POS     Antibody Screen NEG     Sample Expiration 02/13/2013     Unit Number [id number]     Blood Component Type RED CELLS,LR     Unit division 00     Status of Unit ISSUED     Transfusion Status OK TO TRANSFUSE     Crossmatch Result Compatible    CBC     Status: Abnormal   Collection Time    02/10/13  9:15 AM      Result Value Range   WBC 5.5  4.0 -  10.5 K/uL   RBC 1.94 (*) 3.87 - 5.11 MIL/uL   Hemoglobin 5.6 (*) 12.0 - 15.0 g/dL   HCT 40.9 (*) 81.1 - 91.4 %   MCV 87.6  78.0 - 100.0 fL   MCH 28.9  26.0 - 34.0 pg   MCHC 32.9  30.0 - 36.0 g/dL   RDW 78.2 (*) 95.6 - 21.3 %   Platelets 273  150 - 400 K/uL  OCCULT BLOOD X 1 CARD TO LAB, STOOL     Status: None   Collection Time    02/10/13 11:11 AM      Result Value Range   Fecal Occult Bld NEGATIVE  NEGATIVE  CBC     Status: Abnormal   Collection Time    02/10/13  5:27 PM      Result Value Range   WBC 6.6  4.0 - 10.5 K/uL   RBC 2.78 (*) 3.87 - 5.11 MIL/uL   Hemoglobin 8.2 (*) 12.0 - 15.0 g/dL   HCT 08.6 (*) 57.8 - 46.9 %   MCV 87.1  78.0 - 100.0 fL   MCH 29.5  26.0 - 34.0 pg    MCHC 33.9  30.0 - 36.0 g/dL   RDW 62.9  52.8 - 41.3 %   Platelets 305  150 - 400 K/uL    Imaging results:  Ct Head Wo Contrast  02/09/2013   *RADIOLOGY REPORT*  Clinical Data: CVA headache, history of breast cancer  CT HEAD WITHOUT CONTRAST  Technique:  Contiguous axial images were obtained from the base of the skull through the vertex without contrast.  Comparison: 01/19/2013  Findings: No intracranial hemorrhage, mass effect or midline shift. Solitary cystic lesion in the posterior aspect of the left hemisphere again noted measures 4 by 2.4 cm stable in size and appearance from prior exam.  Patchy subcortical white matter decreased attenuation consistent with small vessel ischemic changes again noted.  No acute cortical infarction.  No intraventricular hemorrhage.  IMPRESSION: No significant change.Solitary cystic lesion in the posterior aspect of the left hemisphere again noted measures 4 by 2.4 cm stable in size and appearance from prior exam.  Patchy subcortical white matter decreased attenuation consistent with small vessel ischemic changes again noted.  No acute cortical infarction.  No intraventricular hemorrhage.   Original Report Authenticated By: Natasha Mead, M.D.   Dg Abd Acute W/chest  02/09/2013   *RADIOLOGY REPORT*  Clinical Data: Nausea vomiting.  Abdominal pain.  History breast cancer and asthma.  ACUTE ABDOMEN SERIES (ABDOMEN 2 VIEW & CHEST 1 VIEW)  Comparison: 02/23/2012 chest film.  Findings: Frontal view of the chest demonstrates surgical clips about the left axilla.  Left mastectomy.  Right-sided Port-A-Cath which terminates at the mid to low SVC.  Upper thoracic spine fixation.  Left rotator cuff repair.  Moderate left hemidiaphragm elevation. No pleural effusion or pneumothorax.  Mild tracheal deviation to the right, positional.  Normal heart size.  Left superior mediastinal and adjacent medial left upper lobe soft tissue fullness, grossly similar to the 02/23/2012 exam.  Volume loss  at the left lung base.  Abominal films demonstrate no free intraperitoneal air or significant air fluid levels on upright positioning.  No bowel distention. Distal gas and stool.  IMPRESSION: No acute findings in the abdomen/pelvis.  Similar superior left sided mediastinal and adjacent upper lobe soft tissue fullness.  Incompletely characterized.  Similar left hemidiaphragm elevation.   Original Report Authenticated By: Jeronimo Greaves, M.D.    Assessment and Plan: I have seen  and evaluated the patient as outlined above. I agree with the formulated Assessment and Plan as detailed in the residents' admission note, with the following changes:    Will contact Dr. Truett Perna to see if anemia could be related to recent chemo regimen since her hemoccult was negative in ED. We will continue to check serial cbc to see if needs to have further transfusion. If still trending down, will recheck hemocult, and also consult GI to see if can do EGD and CSY.  Judyann Munson, MD 8/14/201410:52 PM

## 2013-02-10 NOTE — Consult Note (Signed)
Subjective:   HPI  The patient is a 67 year old female who we are asked to see in consultation for anemia and heme positive stool. She was transferred here from [facility]. She has a history of metastatic breast cancer and is undergoing radiation treatment for brain metastases. She was found to be severely anemic and initially when she came in the hospital her stool was heme negative. She dropped her hemoglobin and hematocrit further while here and another stool was checked for occult blood and was positive. The stool was reported to be brown however. The patient denies seeing any blood in her stool or melena. She was having some abdominal pain recently with some nausea and vomiting but denied any hematemesis. She gives no history and states that she has never had a colonoscopy.of peptic ulcer disease  Review of Systems Denies chest pain or shortness of breath   Past Medical History  Diagnosis Date  . Cancer 1996    breast  . Hyperlipidemia   . Hypertension   . Asthma     best PEF=400  . 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 Chronic low back pain     MRI 2010 showed right sided disease with paracentral  disc protusion at L5-S1 which contacts and displaces the right S1 nerve root within the lateral recess  . Migraine headache     MRI 11/18/10: Mild progression of prominent white matter type changes which may be related to a small vessel disease and / or migraine headaches.Other causes for white matter type changes such as that secondaryto; vasculitis, inflammatory process or demyelinating process feltto be secondary considerations.  . Leg swelling   . Neuralgia and neuritis   . Tenosynovitis     left, MRI 05/2003  . Herniated disc     h/o ruptured disk, laminectomy L4-L5, Dr. Montez Morita  . Breast cancer 1996, 2001    f/b Dr. Myrle Sheng. s/p masectomy +radation. w/ metastatic disease, now on  Femara.  . Tobacco abuse   . Hx of radiation therapy 06/21/02 -07/13/02    T1-T6  . Bone metastases     breast primary, T spine  . Shortness of breath     exertion  . Hx of migraines   . Hx of radiation therapy 04/21/12    T3-T4 spinal SRS  . Breast cancer metastasized to multiple sites 07/12/2012  . Brain cancer     new 4 cm brain met   Past Surgical History  Procedure Laterality Date  . Abdominal hysterectomy    . Cesarean section    . Mastectomy      left  . Laminectomy      L4-L5  . Joint replacement      Left Knee  . Back surgery    . Breast surgery    . Amputation  11/26/2011    Procedure: AMPUTATION DIGIT;  Surgeon: Kennieth Rad, MD;  Location[facility];  Service: Orthopedics;  Laterality: Bilateral;  PHALANGECTOMY  - Right fifth toe and left third toe  . Rotator cuff repair      pt does not remember year of surgery  . Thoracic laminectomy  04/01/12    T 2-4-metastatic tumor resection   History   Social History  . Marital Status: Divorced    Spouse Name: N/A    Number of Children: N/A  . Years of Education:  N/A   Occupational History  . Not on file.   Social History Main Topics  . Smoking status: Current Every Day Smoker -- 0.50 packs/day for 50 years    Types: Cigarettes  . Smokeless tobacco: Never Used     Comment: smoking a little less  . Alcohol Use: No  . Drug Use: Yes    Special: Oxycodone  . Sexual Activity: Not Currently   Other Topics Concern  . Not on file   Social History Narrative  . No narrative on file   family history is negative for Anesthesia problems, Hypotension, Malignant hyperthermia, and Pseudochol deficiency. Current facility-administered medications:0.9 %  sodium chloride infusion, , Intravenous, Continuous, Genelle Gather, MD, Last Rate: 100 mL/hr at 02/09/13 2348;  albuterol (PROVENTIL HFA;VENTOLIN HFA) 108 (90 BASE) MCG/ACT inhaler 2 puff, 2 puff, Inhalation, Q6H PRN, Genelle Gather, MD;  docusate sodium (COLACE) capsule  100 mg, 100 mg, Oral, BID, Genelle Gather, MD, 100 mg at 02/10/13 1106 feeding supplement (ENSURE COMPLETE) liquid 237 mL, 237 mL, Oral, TID BM, Genelle Gather, MD, 237 mL at 02/10/13 1000;  feeding supplement (RESOURCE BREEZE) liquid 1 Container, 1 Container, Oral, TID BM, Hettie Holstein, RD;  morphine (MSIR) tablet 15-30 mg, 15-30 mg, Oral, Q4H PRN, Genelle Gather, MD;  senna [facility]) tablet 8.6 mg, 1 tablet, Oral, BID, Genelle Gather, MD, 8.6 mg at 02/10/13 1106 sodium chloride 0.9 % injection 3 mL, 3 mL, Intravenous, Q12H, Genelle Gather, MD, 3 mL at 02/09/13 2345 Allergies  Allergen Reactions  . Banana     Abdominal pain       Objective:     BP 136/58  Pulse 95  Temp(Src) 99.6 F (37.6 C) (Oral)  Resp 24  Ht 5\' 8"  (1.727 m)  SpO2 98%  she is in no distress  Nonicteric  Heart regular rhythm no murmurs  Lungs clear  Abdomen: Bowel sounds normal, soft, nontender   Laboratory No components found with this basename: d1      Assessment:     #1. Metastatic breast cancer  #2. Anemia  #3. Heme positive stool      Plan:      transfuse blood as needed  Etiology of anemia unclear but because of the heme positive stool I will proceed with EGD and colonoscopy.  Lab Results  Component Value Date   HGB 5.6* 02/10/2013   HGB 6.9* 02/10/2013   HGB 7.8* 02/09/2013   HGB 10.2* 01/21/2013   HGB 10.9* 01/19/2013   HGB 11.8 01/13/2013   HCT 17.0* 02/10/2013   HCT 20.9* 02/10/2013   HCT 23.4* 02/09/2013   HCT 31.0* 01/21/2013   HCT 33.1* 01/19/2013   HCT 35.6 01/13/2013   ALKPHOS 57 02/09/2013   ALKPHOS 75 01/19/2013   ALKPHOS 73 01/13/2013   ALKPHOS 82 12/29/2012   ALKPHOS 86 10/23/2010   ALKPHOS 83 09/13/2010   AST 7 02/09/2013   AST 9 01/19/2013   AST 10 01/13/2013   AST 10 12/29/2012   AST 11 10/23/2010   AST 13 09/13/2010   ALT 6 02/09/2013   ALT 8 01/19/2013   ALT 9 01/13/2013   ALT 7 12/29/2012   ALT 8 10/23/2010   ALT 10 09/13/2010

## 2013-02-10 NOTE — Care Management Note (Unsigned)
    Page 1 of 1   02/10/2013     2:43:01 PM   CARE MANAGEMENT NOTE 02/10/2013  Patient:  [name] [name], [name] [name]   Account Number:  [id number]  Date Initiated:  02/10/2013  Documentation initiated by:  Letha Cape  Subjective/Objective Assessment:   dx normocytic anemia  admit- from home.  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     Action/Plan:   Anticipated DC Date:  02/13/2013   Anticipated DC Plan:  HOME W HOME HEALTH SERVICES      DC Planning Services  CM consult      Choice offered to / List presented to:             Status of service:  In process, will continue to follow Medicare Important Message given?   (If response is "NO", the following Medicare IM given date fields will be blank) Date Medicare IM given:   Date Additional Medicare IM given:    Discharge Disposition:    Per UR Regulation:  Reviewed for med. necessity/level of care/duration of stay  If discussed at Long Length of Stay Meetings, dates discussed:    Comments:  02/10/13 14:20 Letha Cape RN,BSN [phone] patient is from home, Patient has an aide with [employer]. patient with normocytic anemia, ? GIB , NCM will continue to follow for dc needs.

## 2013-02-10 NOTE — Progress Notes (Signed)
Brief Interval Progress Note  S: The patient's Hb continued to drop this morning, despite 2 units pRBC's overnight.  Initial FOBT performed in the ED was negative.   O:  GI: external examination of rectum unremarkable.  DRE was performed, with no internal hemorrhoids palpated.  Dark Zong Mcquarrie stool noted on removal of digit.  Specimen applied to Hemoccult card at bedside, with applicator applied, showing a positive results on both windows of the card.  A/P: # GI bleed - given continued drop in Hb and FOBT positive, will consult GI out of concern for GI bleeding.  # Code Status - The patient and I discussed her wishes for the end of her life.  She clearly stated that if her heart were to stop, she would not want CPR, defibrillation, or intubation, but would rather pass peacefully.  Code status changed to DNR.  SignedJanalyn Harder, PGY3 02/10/2013, 10:52 AM

## 2013-02-10 NOTE — H&P (Signed)
Date: 02/10/2013               Patient Name:  [name] [name] MRN: [id number]  DOB: 27-Jun-1946 Age / Sex: 67 y.o., female   PCP: Lorretta Harp, MD         Medical Service: Internal Medicine Teaching Service         Attending Physician: Dr. Judyann Munson, MD    First Contact: Dr. Lauris Chroman Pager: [phone]  Second Contact: Dr. Lorretta Harp Pager: [phone]        After Hours (After 5p/  First Contact Pager: [phone]  weekends / holidays): Second Contact Pager: [phone]   Chief Complaint: Nausea and abdominal pain  History of Present Illness:  [name] [name] is a 67 y.o. female with PMH significant for metastatic breast cancer undergoing palliative radiation for brain mets (last treatment 02/03/13), HTN who presents as a transfer from [facility]. She reports a 1 week history of nausea and non-bloody, non bilious emesis as well abdominal pain. The abdominal pain is localized to the center of her abdomen, described as "aching," and feels worse with increased pressure such as when she bears down to vomit. Her symptoms have increased over the past 48 hours, to the point where she has been unable to eat or drink. Her last meal was a can of Ensure about 36 hours ago. She has only been drinking sips of water since then. She denies fever, chills, diarrhea, constipation, chest pain, SOB.  Blood work done in the [facility] ED was significant for a hemoglobin of 5.1, HCT 16, MCV 89. She denies melena, blood in her stool or on the toilet paper, hematemesis, hematuria, hemoptysis. Denies recent trauma.  [name] has metastatic breast cancer. Her cancer was diagnosed in 1996, ER positive 100%; PR positive 53%; HER-2/neu positive by [facility]. She is status post left mastectomy and axillary node dissection, but unfortunately was lost to follow up before chemotherapy could be completed and had reoccurrence of her cancer in 2010. 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 She began chemotherapy treatment with  Taxol/Herceptin/Pertuzumab on 12/29/12. Her last dose was about 2 weeks ago, when she decided to stop therapy due to persistent nausea. She is followed by Dr. Truett Perna, and per his note, her nausea was thought to be at least partially due to increased ICP from brain mets. She is currently receiving palliative radiation therapy to the head for this.   Meds: Current Facility-Administered Medications  Medication Dose Route Frequency Provider Last Rate Last Dose  . 0.9 %  sodium chloride infusion   Intravenous Continuous Genelle Gather, MD 100 mL/hr at 02/09/13 2348    . albuterol (PROVENTIL HFA;VENTOLIN HFA) 108 (90 BASE) MCG/ACT inhaler 2 puff  2 puff Inhalation Q6H PRN Genelle Gather, MD      . docusate sodium (COLACE) capsule 100 mg  100 mg Oral BID Genelle Gather, MD   100 mg at 02/09/13 2345  . feeding supplement (ENSURE COMPLETE) liquid 237 mL  237 mL Oral TID BM Genelle Gather, MD      . morphine (MSIR) tablet 15-30 mg  15-30 mg Oral Q4H PRN Genelle Gather, MD      . senna [facility]) tablet 8.6 mg  1 tablet Oral BID Genelle Gather, MD   8.6 mg at 02/09/13 2345  . sodium chloride 0.9 % injection 3 mL  3 mL Intravenous Q12H Genelle Gather, MD   3 mL at 02/09/13  2345    Allergies: Allergies as of 02/09/2013 - Review Complete 02/09/2013  Allergen Reaction Noted  . Banana  12/31/2011   Past Medical History  Diagnosis Date  . Cancer 1996    breast  . Hyperlipidemia   . Hypertension   . Asthma     best PEF=400  . 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 Chronic low back pain     MRI 2010 showed right sided disease with paracentral  disc protusion at L5-S1 which contacts and displaces the right S1 nerve root within the lateral recess  . Migraine headache     MRI 11/18/10: Mild progression of prominent white matter type changes which may be related to a small vessel disease and / or migraine  headaches.Other causes for white matter type changes such as that secondaryto; vasculitis, inflammatory process or demyelinating process feltto be secondary considerations.  . Leg swelling   . Neuralgia and neuritis   . Tenosynovitis     left, MRI 05/2003  . Herniated disc     h/o ruptured disk, laminectomy L4-L5, Dr. Montez Morita  . Breast cancer 1996, 2001    f/b Dr. Myrle Sheng. s/p masectomy +radation. w/ metastatic disease, now on Femara.  . Tobacco abuse   . Hx of radiation therapy 06/21/02 -07/13/02    T1-T6  . Bone metastases     breast primary, T spine  . Shortness of breath     exertion  . Hx of migraines   . Hx of radiation therapy 04/21/12    T3-T4 spinal SRS  . Breast cancer metastasized to multiple sites 07/12/2012  . Brain cancer     new 4 cm brain met   Past Surgical History  Procedure Laterality Date  . Abdominal hysterectomy    . Cesarean section    . Mastectomy      left  . Laminectomy      L4-L5  . Joint replacement      Left Knee  . Back surgery    . Breast surgery    . Amputation  11/26/2011    Procedure: AMPUTATION DIGIT;  Surgeon: Kennieth Rad, MD;  Location: [facility] OR;  Service: Orthopedics;  Laterality: Bilateral;  PHALANGECTOMY  - Right fifth toe and left third toe  . Rotator cuff repair      pt does not remember year of surgery  . Thoracic laminectomy  04/01/12    T 2-4-metastatic tumor resection   Family History  Problem Relation Age of Onset  . Anesthesia problems Neg Hx   . Hypotension Neg Hx   . Malignant hyperthermia Neg Hx   . Pseudochol deficiency Neg Hx    History   Social History  . Marital Status: Divorced    Spouse Name: N/A    Number of Children: N/A  . Years of Education: N/A   Occupational History  . Not on file.   Social History Main Topics  . Smoking status: Current Every Day Smoker -- 0.50 packs/day for 50 years    Types: Cigarettes  . Smokeless tobacco: Never Used     Comment: smoking a little less  . Alcohol Use: No  .  Drug Use: Yes    Special: Oxycodone  . Sexual Activity: Not Currently   Other Topics Concern  . Not on file   Social History Narrative  . No narrative on  file    Review of Systems: Pertinent items are noted in HPI. 10 point ROS performed.  Physical Exam: Blood pressure 128/75, pulse 81, temperature 98.8 F (37.1 C), temperature source Oral, resp. rate 18, height 5\' 8"  (1.727 m), SpO2 100.00%. Physical Exam  Constitutional: She is oriented to person, place, and time. She appears distressed (mildly distressed and moaning when we initially woke her from sleep; became more comfortable as history performed).  HENT:  Head: Normocephalic and atraumatic.  Mouth/Throat: Oropharynx is clear and moist.  Neck: Normal range of motion. Neck supple.  Cardiovascular: Normal rate, regular rhythm, normal heart sounds and intact distal pulses.  Exam reveals no gallop and no friction rub.   No murmur heard. No LE edema.  Pulmonary/Chest: Effort normal and breath sounds normal. No respiratory distress. She has no wheezes. She has no rales. She exhibits no tenderness.  Port-a-cath in place. Left mastectomy.  Abdominal: Soft. Bowel sounds are normal. She exhibits no distension and no mass. There is tenderness (tender to palpation in epigastric region). There is no rebound and no guarding.  Musculoskeletal: Normal range of motion.  Neurological: She is alert and oriented to person, place, and time.  Skin: Skin is warm and dry. No rash noted. She is not diaphoretic. No erythema.     Lab results: Basic Metabolic Panel:  Recent Labs  16/10/96 1204  NA 138  K 3.3*  CL 103  CO2 29  GLUCOSE 101*  BUN 10  CREATININE 0.76  CALCIUM 8.3*   Liver Function Tests:  Recent Labs  02/09/13 1204  AST 7  ALT 6  ALKPHOS 57  BILITOT 0.2*  PROT 5.7*  ALBUMIN 2.2*   Total Bilirubin  Date Value Range Status  02/09/2013 0.2* 0.3 - 1.2 mg/dL Final     Bilirubin, Direct  Date Value Range Status    02/09/2013 <0.1  0.0 - 0.3 mg/dL Final     Indirect Bilirubin  Date Value Range Status  02/09/2013 NOT CALCULATED  0.3 - 0.9 mg/dL Final    Recent Labs  04/54/09 1204  LIPASE 11   CBC:  Recent Labs  02/09/13 1204 02/09/13 2300  WBC 7.1 8.0  NEUTROABS 5.1  --   HGB 5.1* 7.8*  HCT 16.0* 23.4*  MCV 88.9 87.6  PLT 381 348   MCH  Date Value Range Status  02/09/2013 29.2  26.0 - 34.0 pg Final     MCHC  Date Value Range Status  02/09/2013 33.3  30.0 - 36.0 g/dL Final     RDW  Date Value Range Status  02/09/2013 16.0* 11.5 - 15.5 % Final   Neut%  74 Lymph%  17 Mono%  10 Eos%   1 Baso%  0 Neut#   5.1 Lymph#  1.2  01/21/2013 Hgb=10.2 01/19/2013 Hgb=10.9  Fecal Occult Bld  Date Value Range Status  02/09/2013 NEGATIVE  NEGATIVE Final    ABO/RH(D)  Date Value Range Status  02/09/2013 O POS   Final   Antibody Screen  Date Value Range Status  02/09/2013 NEG   Final   Urinalysis:  Recent Labs  02/09/13 1323  COLORURINE YELLOW  LABSPEC 1.021  PHURINE 5.5  GLUCOSEU NEGATIVE  HGBUR NEGATIVE  BILIRUBINUR NEGATIVE  KETONESUR NEGATIVE  PROTEINUR NEGATIVE  UROBILINOGEN 1.0  NITRITE NEGATIVE  LEUKOCYTESUR MODERATE*    Imaging results:  Ct Head Wo Contrast  02/09/2013   *RADIOLOGY REPORT*  Clinical Data: CVA headache, history of breast cancer  CT HEAD WITHOUT CONTRAST  Technique:  Contiguous axial images were obtained from the base of the skull through the vertex without contrast.  Comparison: 01/19/2013  Findings: No intracranial hemorrhage, mass effect or midline shift. Solitary cystic lesion in the posterior aspect of the left hemisphere again noted measures 4 by 2.4 cm stable in size and appearance from prior exam.  Patchy subcortical white matter decreased attenuation consistent with small vessel ischemic changes again noted.  No acute cortical infarction.  No intraventricular hemorrhage.  IMPRESSION: No significant change.Solitary cystic lesion in the  posterior aspect of the left hemisphere again noted measures 4 by 2.4 cm stable in size and appearance from prior exam.  Patchy subcortical white matter decreased attenuation consistent with small vessel ischemic changes again noted.  No acute cortical infarction.  No intraventricular hemorrhage.   Original Report Authenticated By: Natasha Mead, M.D.   Dg Abd Acute W/chest  02/09/2013   *RADIOLOGY REPORT*  Clinical Data: Nausea vomiting.  Abdominal pain.  History breast cancer and asthma.  ACUTE ABDOMEN SERIES (ABDOMEN 2 VIEW & CHEST 1 VIEW)  Comparison: 02/23/2012 chest film.  Findings: Frontal view of the chest demonstrates surgical clips about the left axilla.  Left mastectomy.  Right-sided Port-A-Cath which terminates at the mid to low SVC.  Upper thoracic spine fixation.  Left rotator cuff repair.  Moderate left hemidiaphragm elevation. No pleural effusion or pneumothorax.  Mild tracheal deviation to the right, positional.  Normal heart size.  Left superior mediastinal and adjacent medial left upper lobe soft tissue fullness, grossly similar to the 02/23/2012 exam.  Volume loss at the left lung base.  Abominal films demonstrate no free intraperitoneal air or significant air fluid levels on upright positioning.  No bowel distention. Distal gas and stool.  IMPRESSION: No acute findings in the abdomen/pelvis.  Similar superior left sided mediastinal and adjacent upper lobe soft tissue fullness.  Incompletely characterized.  Similar left hemidiaphragm elevation.   Original Report Authenticated By: Jeronimo Greaves, M.D.    Assessment & Plan by Problem: [name] is a 67 y.o. female with PMH significant for metastatic breast cancer undergoing palliative radiation for brain mets (last treatment 02/03/13), HTN who presents as a transfer from [facility], with a chief complaint of nausea but found to be profoundly anemic.  #Normocytic anemia: Acute onset anemia with Hgb 5.0 on presentation, prior Hgb 10.2 on  01/21/13. She denies any recent traumas, blood in her stool or on the toilet paper, hematuria, or hematemesis. She is receiving radiation therapy for her brain metastases and recently stopped chemotherapy 2/2 to side effects (nausea). She was transfused 2u pRBCs in the ED, and her repeat Hgb was 7.8. Most commonly, anemia in patients with cancer is the consequence of cancer therapy. Both ionizing radiation and especially systemic antineoplastic therapies have been implicated. Common mechanisms are stem cell death, suppression of erythropoetin synthesis, oxidant damage to mature RBCS, induction of immune-mediated cell destruction (AIHA), acute bone marrow stomal damage. With regards to her specific regimen, Taxol can cause bone marrow suppression, and anemia in 47% to 90% of pts, Herceptin can cause anemia in 4%, and Pertuzumab results in anemia in 2-23% per study statistics. Malignancy itself can cause an autoimmune hemolytic anemia, though this is seen more frequently with leukemias and lymphomas and is usually macrocytic. - Checking anemia panel - iron and TIBC, ferritin, B12, folate, retic count - Checking LDH, haptoglobin, indirect bili, and peripheral smear  - q6h CBCs  - Transfuse for Hgb<7   #Metastatic breast cancer: Currently receiving  palliative radiation therapy for her brain metastasis. Will need to notify her oncologist Dr. Truett Perna if Mrs. Cales remains in the hospital.  - Continuing home morphine MS-IR 15-30mg  po q4h PRN  - Heart healthy diet  - Ensure TID between meals  - Normal saline @ 100cc/hr while with poor po intake   #Nausea - Thought to be due to increased ICP from brain mets. - Zofran 4mg  q6-8h PRN   #HTN: BP stable on admission. No home BP meds. Will continue to monitor.   #DVT PPx: SCDs; held chemical prophylaxis 2/2 anemia.  Dispo: Disposition is deferred at this time, awaiting improvement of current medical problems. Anticipated discharge in approximately 1-3 day(s).    The patient does have a current PCP Lorretta Harp, MD) and does need an [facility] hospital follow-up appointment after discharge.  The patient does not have transportation limitations that hinder transportation to clinic appointments.  Signed: Vivi Barrack, MD 02/10/2013, 6:47 AM

## 2013-02-10 NOTE — Progress Notes (Signed)
INITIAL NUTRITION ASSESSMENT  DOCUMENTATION CODES Per approved criteria  -Severe malnutrition in the context of chronic illness   INTERVENTION:  While on clear liquids, change supplement to Resource Breeze PO TID, each supplement provides 250 kcal and 9 grams of protein.  NUTRITION DIAGNOSIS: Malnutrition related to inadequate oral intake with chronic catabolic illness as evidenced by 12% weight loss in the past 6 months and severe depletion of muscle mass.  Goal: Intake to meet >90% of estimated nutrition needs.  Monitor:  Diet advancement, PO intake, labs, weight trend.  Reason for Assessment: MD Consult for assessment of nutrition status  67 y.o. female  Admitting Dx: Normocytic anemia  ASSESSMENT: Patient with PMH significant for metastatic breast cancer undergoing palliative radiation for brain mets (last treatment 02/03/13), who presented as a transfer from [facility]. One week history of nausea and vomiting with abdominal pain. She has been unable to eat or drink anything. 12% weight loss in the past 5-6 months.   Diet downgraded to Clear Liquids from [employer] earlier today for possible colonoscopy per RN. Spoke with patient's grandson who reports that she has been eating okay; drinks Ensure supplements at home. Patient sleeping during RD visit.  Pt meets criteria for severe MALNUTRITION in the context of chronic illness as evidenced by 12% weight loss in the past 6 months and severe depletion of muscle mass.  Nutrition Focused Physical Exam:  Subcutaneous Fat:  Orbital Region: mild-moderate depletion Upper Arm Region: WNL Thoracic and Lumbar Region: NA  Muscle:  Temple Region: mild-moderate depletion Clavicle Bone Region: mild-moderate depletion Clavicle and Acromion Bone Region: severe depletion Scapular Bone Region: NA Dorsal Hand: NA Patellar Region: severe depletion Anterior Thigh Region: mild-moderate depletion Posterior Calf Region: mild-moderate  depletion  Edema: none  Height: Ht Readings from Last 1 Encounters:  02/09/13 5\' 8"  (1.727 m)    Weight: Wt Readings from Last 1 Encounters:  01/21/13 170 lb 9.6 oz (77.384 kg)    Ideal Body Weight: 63.6 kg  % Ideal Body Weight: 122%  Wt Readings from Last 10 Encounters:  01/21/13 170 lb 9.6 oz (77.384 kg)  01/13/13 169 lb 9.6 oz (76.93 kg)  12/21/12 178 lb 12.8 oz (81.103 kg)  11/04/12 185 lb (83.915 kg)  10/14/12 189 lb 6.4 oz (85.911 kg)  09/21/12 193 lb 6.4 oz (87.726 kg)  09/06/12 193 lb 1.6 oz (87.59 kg)  07/26/12 198 lb 12.8 oz (90.175 kg)  07/12/12 204 lb (92.534 kg)  06/08/12 202 lb 8 oz (91.853 kg)    Usual Body Weight: 193 lb (5 months ago)  % Usual Body Weight: 88%  BMI:  25.9  Estimated Nutritional Needs: Kcal: 1900-2100 Protein: 100-120 gm Fluid: 1.8-2 L  Skin: no problems  Diet Order: Clear Liquid  Supplements: Ensure Complete PO TID  EDUCATION NEEDS: -Education not appropriate at this time   Intake/Output Summary (Last 24 hours) at 02/10/13 1157 Last data filed at 02/10/13 0900  Gross per 24 hour  Intake 2631.83 ml  Output      0 ml  Net 2631.83 ml    Last BM: 8/13   Labs:   Recent Labs Lab 02/09/13 1204 02/10/13 0610  NA 138 140  K 3.3* 3.5  CL 103 106  CO2 29 28  BUN 10 7  CREATININE 0.76 0.81  CALCIUM 8.3* 8.4  GLUCOSE 101* 88    CBG (last 3)  No results found for this basename: GLUCAP,  in the last 72 hours  Scheduled Meds: .  docusate sodium  100 mg Oral BID  . feeding supplement  237 mL Oral TID BM  . senna  1 tablet Oral BID  . sodium chloride  3 mL Intravenous Q12H    Continuous Infusions: . sodium chloride 100 mL/hr at 02/09/13 2348    Past Medical History  Diagnosis Date  . Cancer 1996    breast  . Hyperlipidemia   . Hypertension   . Asthma     best PEF=400  . 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  Chronic low back pain     MRI 2010 showed right sided disease with paracentral  disc protusion at L5-S1 which contacts and displaces the right S1 nerve root within the lateral recess  . Migraine headache     MRI 11/18/10: Mild progression of prominent white matter type changes which may be related to a small vessel disease and / or migraine headaches.Other causes for white matter type changes such as that secondaryto; vasculitis, inflammatory process or demyelinating process feltto be secondary considerations.  . Leg swelling   . Neuralgia and neuritis   . Tenosynovitis     left, MRI 05/2003  . Herniated disc     h/o ruptured disk, laminectomy L4-L5, Dr. Montez Morita  . Breast cancer 1996, 2001    f/b Dr. Myrle Sheng. s/p masectomy +radation. w/ metastatic disease, now on Femara.  . Tobacco abuse   . Hx of radiation therapy 06/21/02 -07/13/02    T1-T6  . Bone metastases     breast primary, T spine  . Shortness of breath     exertion  . Hx of migraines   . Hx of radiation therapy 04/21/12    T3-T4 spinal SRS  . Breast cancer metastasized to multiple sites 07/12/2012  . Brain cancer     new 4 cm brain met    Past Surgical History  Procedure Laterality Date  . Abdominal hysterectomy    . Cesarean section    . Mastectomy      left  . Laminectomy      L4-L5  . Joint replacement      Left Knee  . Back surgery    . Breast surgery    . Amputation  11/26/2011    Procedure: AMPUTATION DIGIT;  Surgeon: Kennieth Rad, MD;  Location: [facility] OR;  Service: Orthopedics;  Laterality: Bilateral;  PHALANGECTOMY  - Right fifth toe and left third toe  . Rotator cuff repair      pt does not remember year of surgery  . Thoracic laminectomy  04/01/12    T 2-4-metastatic tumor resection    Joaquin Courts, RD, LDN, CNSC Pager [phone] After Hours Pager [phone]

## 2013-02-11 ENCOUNTER — Encounter (HOSPITAL_COMMUNITY): Payer: Self-pay

## 2013-02-11 ENCOUNTER — Encounter (HOSPITAL_COMMUNITY)
Admission: EM | Disposition: A | Discharge status: Home / Self Care | Payer: Self-pay | Source: Home / Self Care | Attending: Internal Medicine

## 2013-02-11 DIAGNOSIS — K253 Acute gastric ulcer without hemorrhage or perforation: Secondary | ICD-10-CM

## 2013-02-11 DIAGNOSIS — C7931 Secondary malignant neoplasm of brain: Secondary | ICD-10-CM

## 2013-02-11 DIAGNOSIS — C801 Malignant (primary) neoplasm, unspecified: Secondary | ICD-10-CM

## 2013-02-11 DIAGNOSIS — N390 Urinary tract infection, site not specified: Secondary | ICD-10-CM

## 2013-02-11 DIAGNOSIS — C787 Secondary malignant neoplasm of liver and intrahepatic bile duct: Secondary | ICD-10-CM

## 2013-02-11 DIAGNOSIS — C50919 Malignant neoplasm of unspecified site of unspecified female breast: Secondary | ICD-10-CM

## 2013-02-11 DIAGNOSIS — E43 Unspecified severe protein-calorie malnutrition: Secondary | ICD-10-CM

## 2013-02-11 HISTORY — PX: ESOPHAGOGASTRODUODENOSCOPY: SHX5428

## 2013-02-11 HISTORY — PX: COLONOSCOPY: SHX5424

## 2013-02-11 LAB — TYPE AND SCREEN
ABO/RH(D): O POS
Antibody Screen: NEGATIVE
Unit division: 0

## 2013-02-11 LAB — CBC
HCT: 25.7 % — ABNORMAL LOW (ref 36.0–46.0)
HCT: 26 % — ABNORMAL LOW (ref 36.0–46.0)
HCT: 30.2 % — ABNORMAL LOW (ref 36.0–46.0)
Hemoglobin: 9.9 g/dL — ABNORMAL LOW (ref 12.0–15.0)
MCH: 29.1 pg (ref 26.0–34.0)
MCHC: 32.8 g/dL (ref 30.0–36.0)
MCV: 87.2 fL (ref 78.0–100.0)
MCV: 88 fL (ref 78.0–100.0)
Platelets: 291 10*3/uL (ref 150–400)
Platelets: 317 10*3/uL (ref 150–400)
RBC: 2.92 MIL/uL — ABNORMAL LOW (ref 3.87–5.11)
RBC: 2.98 MIL/uL — ABNORMAL LOW (ref 3.87–5.11)
RBC: 3.42 MIL/uL — ABNORMAL LOW (ref 3.87–5.11)
WBC: 7.3 10*3/uL (ref 4.0–10.5)

## 2013-02-11 LAB — PATHOLOGIST SMEAR REVIEW

## 2013-02-11 SURGERY — EGD (ESOPHAGOGASTRODUODENOSCOPY)
Anesthesia: Moderate Sedation

## 2013-02-11 SURGERY — COLONOSCOPY WITH ESOPHAGOGASTRODUODENOSCOPY (EGD)
Anesthesia: Moderate Sedation

## 2013-02-11 MED ORDER — MORPHINE SULFATE 15 MG PO TABS: 15.0000 mg | ORAL_TABLET | ORAL | Status: DC | PRN

## 2013-02-11 MED ORDER — PANTOPRAZOLE SODIUM 40 MG IV SOLR
40.0000 mg | INTRAVENOUS | Status: DC
  Administered 2013-02-11: 40 mg via INTRAVENOUS
  Filled 2013-02-11 (×2): qty 40

## 2013-02-11 MED ORDER — MIDAZOLAM HCL 5 MG/ML IJ SOLN
INTRAMUSCULAR | Status: AC
  Filled 2013-02-11: qty 2

## 2013-02-11 MED ORDER — FENTANYL CITRATE 0.05 MG/ML IJ SOLN
INTRAMUSCULAR | Status: AC
  Filled 2013-02-11: qty 4

## 2013-02-11 MED ORDER — ALBUTEROL SULFATE HFA 108 (90 BASE) MCG/ACT IN AERS
2.0000 | INHALATION_SPRAY | Freq: Four times a day (QID) | RESPIRATORY_TRACT | Status: DC | PRN
  Filled 2013-02-11: qty 6.7

## 2013-02-11 MED ORDER — FENTANYL CITRATE 0.05 MG/ML IJ SOLN
INTRAMUSCULAR | Status: DC | PRN
  Administered 2013-02-11 (×6): 25 ug via INTRAVENOUS

## 2013-02-11 MED ORDER — MIDAZOLAM HCL 5 MG/5ML IJ SOLN
INTRAMUSCULAR | Status: DC | PRN
  Administered 2013-02-11 (×5): 2 mg via INTRAVENOUS

## 2013-02-11 MED ORDER — BUTAMBEN-TETRACAINE-BENZOCAINE 2-2-14 % EX AERO
INHALATION_SPRAY | CUTANEOUS | Status: DC | PRN
  Administered 2013-02-11: 1 via TOPICAL

## 2013-02-11 NOTE — Progress Notes (Signed)
  Date: 02/11/2013  Patient name: [name] [name]  Medical record number: [id number]  Date of birth: 19-Aug-1945   This patient has been seen and the plan of care was discussed with the house staff. Please see their note for complete details. I concur with their findings with the following additions/corrections:  Agree with plan as outlined by Dr. Yetta Barre. Appreciate input from Dr. Truett Perna as well as palliative care team. Given that the patient is not interested in seeking further treatment for her metastatic breast ca, we will have palliative care tream help guide the discussion surrounding goals of care. We will continue to follow CBC Q12hr to ensure no further bleeding from large 3 cm gastric ulcer found on EGD today which is likely the source of her acute anemia.  Judyann Munson, MD 02/11/2013, 10:15 PM

## 2013-02-11 NOTE — Clinical Documentation Improvement (Signed)
THIS DOCUMENT IS NOT A PERMANENT PART OF THE MEDICAL RECORD  Please update your documentation with the medical record to reflect your response to this query. If you need help knowing how to do this please call [phone].                                                                                         02/11/13   Dear Dr. Manson Passey / Associates,  In a better effort to capture your patient's severity of illness, reflect appropriate length of stay and utilization of resources, a review of the patient medical record has revealed a gastric ulcer, heme + stool, anemia.   Presents with abdominal pain and anemia; has metastatic breast cancer to brain, bone, liver, chest wall   H&H on admit was: 5.1 / 16; treated with transfusions  EGD was positive for gastric ulcer; no notation of bleeding/hemorrhage  Oncology documents "doubt the anemia is related to the systemic therapy".  Based on your clinical judgment, please clarify and document in progress note and discharge summary if there is a causal relationship between the gastric ulcer and GI bleeding.  In responding to this query please exercise your independent judgment.  The fact that a  query is asked, does not imply that any particular answer is desired or expected.   You may use possible, probable, or suspect with inpatient documentation.  Reviewed: additional documentation in the medical record. There is description of ulcer and anemia link in progress note and discharge summary.  Thank You,  Shellee Milo  Clinical Documentation Specialist: [phone] Health Information Management 

## 2013-02-11 NOTE — Op Note (Signed)
[facility] [address] [place], [address]   ENDOSCOPY PROCEDURE REPORT  PATIENT: Neah, Sporrer  MR#: [id number] BIRTHDATE: 09-23-1945 , 67  yrs. old GENDER: Female ENDOSCOPIST: Wandalee Ferdinand, MD REFERRED BY: PROCEDURE DATE:  02/11/2013 PROCEDURE:   EGD with biopsy ASA CLASS: 3 INDICATIONS: anemia heme positive stool MEDICATIONS: fentanyl 150 mcg IV, Versed 10 mg IV TOPICAL ANESTHETIC: Cetacaine spray  DESCRIPTION OF PROCEDURE:   After the risks benefits and alternatives of the procedure were thoroughly explained, informed consent was obtained.  The Pentax       endoscope was introduced through the mouth and advanced to the second portion of the duodenum      , limited by Without limitations.   The instrument was slowly withdrawn as the mucosa was fully examined.      FINDINGS:  Esophagus: Normal  Stomach: large gastric ulcer in the upper body as shown in above photographs. I estimate this is about 3 cm. Biopsy obtained.the rest of the stomach looked erythematous.  Duodenum: Normal  COMPLICATIONS:none  ENDOSCOPIC IMPRESSION: large gastric ulcer as seen above  recommend PPI therapy, check biopsies. We will sign off, call us again if needed.       _______________________________ Rosalie DoctorWandalee Ferdinand, MD 02/11/2013 11:05 AM

## 2013-02-11 NOTE — Progress Notes (Signed)
Patient complained about taking her Go lytly- RN educated patient on the reason why she needed to take the medication. RN set goal for patient by telling her to drink two cups every hour. Pt agreed that that would work for her. Pt drank two cups every hour and bowel movements are now clear. Will continue to monitor patient.

## 2013-02-11 NOTE — Progress Notes (Signed)
Subjective: [name] is a 67 y.o. female with PMH significant for metastatic breast cancer undergoing palliative radiation for brain mets (last treatment 02/03/13), HTN who presents as a transfer from [facility]. She reports a 1 week history of nausea and non-bloody, non bilious emesis as well abdominal pain. Blood work done in the [facility] ED was significant for a hemoglobin of 5.1, HCT 16, MCV 89.  Patient seen at bedside this AM.   Yesterday in the AM, she was given 2 units of PRBC's. Her Hb trend is as follows:  5.1 (@[place]  Long)-->7.8 (s/p 2 units PRBCs)-->6.9-->5.6-->8.2-->9.1  Patient found to be FOBT +ve in the AM yesterday. GI consulted, for EGD/colonoscopy today.  Objective: Vital signs in last 24 hours: Filed Vitals:   02/10/13 1328 02/10/13 1627 02/10/13 2056 02/11/13 0448  BP: 136/58  151/81 151/73  Pulse: 95  95 83  Temp: 99.6 F (37.6 C)  99.6 F (37.6 C) 99.3 F (37.4 C)  TempSrc: Oral  Oral Oral  Resp: 24  18 20   Height:  5\' 8"  (1.727 m)    Weight:  171 lb (77.565 kg)    SpO2:   99% 97%   Weight change:   Intake/Output Summary (Last 24 hours) at 02/11/13 1610 Last data filed at 02/10/13 1900  Gross per 24 hour  Intake   2190 ml  Output      0 ml  Net   2190 ml   Physical Exam: General: Alert, cooperative, and in no apparent distress HEENT: Vision grossly intact, oropharynx clear and non-erythematous  Neck: Full range of motion without pain, supple, no lymphadenopathy or carotid bruits Lungs: Clear to ascultation bilaterally, normal work of respiration, no wheezes, rales, ronchi Heart: Regular rate and rhythm, no murmurs, gallops, or rubs Abdomen: Soft, non-tender, non-distended, normal bowel sounds Extremities: No cyanosis, clubbing, or edema Neurologic: Alert & oriented X3, cranial nerves II-XII intact, strength grossly intact, sensation intact to light touch  Lab Results: Basic Metabolic Panel:  Recent Labs Lab 02/09/13 1204  02/10/13 0610  NA 138 140  K 3.3* 3.5  CL 103 106  CO2 29 28  GLUCOSE 101* 88  BUN 10 7  CREATININE 0.76 0.81  CALCIUM 8.3* 8.4   Liver Function Tests:  Recent Labs Lab 02/09/13 1204 02/10/13 0610  AST 7  --   ALT 6  --   ALKPHOS 57  --   BILITOT 0.2* 0.4  PROT 5.7*  --   ALBUMIN 2.2*  --     Recent Labs Lab 02/09/13 1204  LIPASE 11   No results found for this basename: AMMONIA,  in the last 168 hours CBC:  Recent Labs Lab 02/09/13 1204  02/10/13 1727 02/10/13 2348  WBC 7.1  < > 6.6 8.0  NEUTROABS 5.1  --   --   --   HGB 5.1*  < > 8.2* 9.1*  HCT 16.0*  < > 24.2* 27.6*  MCV 88.9  < > 87.1 87.6  PLT 381  < > 305 384  < > = values in this interval not displayed.  Anemia Panel:  Recent Labs Lab 02/10/13 0610  VITAMINB12 504  FOLATE 7.1  FERRITIN 53  TIBC 232*  IRON 17*  RETICCTPCT 2.6   Urine Drug Screen: Drugs of Abuse  No results found for this basename: labopia, cocainscrnur, labbenz, amphetmu, thcu, labbarb    Alcohol Level: No results found for this basename: ETH,  in the last 168 hours Urinalysis:  Recent Labs Lab 02/09/13 1323  COLORURINE YELLOW  LABSPEC 1.021  PHURINE 5.5  GLUCOSEU NEGATIVE  HGBUR NEGATIVE  BILIRUBINUR NEGATIVE  KETONESUR NEGATIVE  PROTEINUR NEGATIVE  UROBILINOGEN 1.0  NITRITE NEGATIVE  LEUKOCYTESUR MODERATE*    Micro Results: Recent Results (from the past 240 hour(s))  URINE CULTURE     Status: None   Collection Time    02/09/13  1:23 PM      Result Value Range Status   Specimen Description URINE, CLEAN CATCH   Final   Special Requests NONE   Final   Culture  Setup Time     Final   Value: 02/09/2013 21:06     Performed at [facility] Count     Final   Value: >=100,000 COLONIES/ML     Performed at [facility]   Culture     Final   Value: VIRIDANS STREPTOCOCCUS     Performed at [facility]   Report Status 02/10/2013 FINAL   Final   Studies/Results: Ct Head Wo  Contrast  02/09/2013   *RADIOLOGY REPORT*  Clinical Data: CVA headache, history of breast cancer  CT HEAD WITHOUT CONTRAST  Technique:  Contiguous axial images were obtained from the base of the skull through the vertex without contrast.  Comparison: 01/19/2013  Findings: No intracranial hemorrhage, mass effect or midline shift. Solitary cystic lesion in the posterior aspect of the left hemisphere again noted measures 4 by 2.4 cm stable in size and appearance from prior exam.  Patchy subcortical white matter decreased attenuation consistent with small vessel ischemic changes again noted.  No acute cortical infarction.  No intraventricular hemorrhage.  IMPRESSION: No significant change.Solitary cystic lesion in the posterior aspect of the left hemisphere again noted measures 4 by 2.4 cm stable in size and appearance from prior exam.  Patchy subcortical white matter decreased attenuation consistent with small vessel ischemic changes again noted.  No acute cortical infarction.  No intraventricular hemorrhage.   Original Report Authenticated By: Natasha Mead, M.D.   Dg Abd Acute W/chest  02/09/2013   *RADIOLOGY REPORT*  Clinical Data: Nausea vomiting.  Abdominal pain.  History breast cancer and asthma.  ACUTE ABDOMEN SERIES (ABDOMEN 2 VIEW & CHEST 1 VIEW)  Comparison: 02/23/2012 chest film.  Findings: Frontal view of the chest demonstrates surgical clips about the left axilla.  Left mastectomy.  Right-sided Port-A-Cath which terminates at the mid to low SVC.  Upper thoracic spine fixation.  Left rotator cuff repair.  Moderate left hemidiaphragm elevation. No pleural effusion or pneumothorax.  Mild tracheal deviation to the right, positional.  Normal heart size.  Left superior mediastinal and adjacent medial left upper lobe soft tissue fullness, grossly similar to the 02/23/2012 exam.  Volume loss at the left lung base.  Abominal films demonstrate no free intraperitoneal air or significant air fluid levels on upright  positioning.  No bowel distention. Distal gas and stool.  IMPRESSION: No acute findings in the abdomen/pelvis.  Similar superior left sided mediastinal and adjacent upper lobe soft tissue fullness.  Incompletely characterized.  Similar left hemidiaphragm elevation.   Original Report Authenticated By: Jeronimo Greaves, M.D.   Medications: I have reviewed the patient's current medications. Scheduled Meds: . docusate sodium  100 mg Oral BID  . feeding supplement  237 mL Oral TID BM  . feeding supplement  1 Container Oral TID BM  . senna  1 tablet Oral BID  . sodium chloride  3 mL Intravenous Q12H  Continuous Infusions: . sodium chloride 100 mL/hr at 02/11/13 0030  . sodium chloride 20 mL/hr at 02/10/13 2057   PRN Meds:.albuterol, morphine Assessment/Plan: [name] is a 67 y.o. female with PMH significant for metastatic breast cancer undergoing palliative radiation for brain mets (last treatment 02/03/13), HTN who presents as a transfer from [facility], with a chief complaint of nausea but found to be profoundly anemic.   #Normocytic anemia: Acute onset anemia with Hgb 5.0 on presentation, prior Hgb 10.2 on 01/21/13. She denies any recent traumas, blood in her stool or on the toilet paper, hematuria, or hematemesis. She is receiving radiation therapy for her brain metastases and recently stopped chemotherapy 2/2 to side effects (nausea). She was transfused 2u pRBCs in the ED, and her repeat Hgb was 7.8. Hb trend is as follows: 5.1 (@[place]  Long)-->7.8 (s/p 2 units PRBCs)-->6.9-->5.6-->8.2-->9.1 -FOBT in the AM yesterday found to be +ve. GI consulted, patient went for EGD/colonoscopy today. Found a large 3 cm gastric ulcer located in the upper body of the stomach, explaining her rapid drop in Hb on admission. Colonoscopy normal. -Started on Protonix 40 mg IV qd for now, as per GI recs. -Anemia panel shows Fe of 17, Ferritin 53 of and TIBC of 232. MCV is currently 87.6. Most likely, acute  decrease in Hb is d/t acute blood loss, however a mixed picture of blood loss, ACD, and Iron deficiency anemia is seen.  #Metastatic breast cancer: Recently received palliative radiation therapy for her brain metastasis as well as only one treatment w/ Taxol and Herceptin. The patient is refusing further treatment and is now wishing to be DNR. Seen by Dr. Truett Perna today. She is still refusing further chemotherapy or radiation, despite her more treatable form of breast ca given her HER-2 NEU +ve malignancy. - Palliative care consulted for discussion of Hospice care. Scheduled to meet w/ patient in the AM to discuss goals of care. - Continuing home morphine MS-IR 15-30mg  po q4h PRN  - Heart healthy diet  - Ensure TID between meals  - Normal saline @ 100cc/hr while with poor po intake   #Nausea - Thought to be due to increased ICP from brain mets.  - Zofran 4mg  q6-8h PRN   #HTN: BP stable on admission. No home BP meds. Will continue to monitor.   #DVT PPx: SCDs; held chemical prophylaxis 2/2 anemia.  Dispo: Disposition is deferred at this time, awaiting improvement of current medical problems.  Anticipated discharge in approximately 1 day(s).   The patient does have a current PCP Lorretta Harp, MD) and does need an [facility] hospital follow-up appointment after discharge.  The patient does not have transportation limitations that hinder transportation to clinic appointments.  .Services Needed at time of discharge: Y = Yes, Blank = No PT:   OT:   RN:   Equipment:   Other:     LOS: 2 days   Lars Masson, MD 02/11/2013, 6:38 AM Pager: [phone]

## 2013-02-11 NOTE — Progress Notes (Addendum)
Thank you for consulting the Palliative Medicine Team at [facility] to meet your patient's and family's needs.  The reason that you asked [place] to see your patient is for a GOC - Goals of Care, pt refusing further treatment We have scheduled your patient for a meeting Sat 8/16 @  8am - up for discharge tomorrow Other family members that will be present include: niece Marylu Lund [phone] Your patient is able to participate. Additional Narrative: metastatic breast cancer to brain, chest, liver, thoracic spine.   Chalmers Cater RN,  PMT RN Liaison Call PMT phone [phone]

## 2013-02-11 NOTE — Op Note (Signed)
[facility] [address] [place], [address]   COLONOSCOPY PROCEDURE REPORT  PATIENT: [name] [name], [name] [name]  MR#: [id number] BIRTHDATE: 12/12/1945 , 67  yrs. old GENDER: Female ENDOSCOPIST: Wandalee Ferdinand, MD REFERRED BY: PROCEDURE DATE:  02/11/2013 PROCEDURE:   colonoscopy to proximal ascending colon ASA CLASS:   3 INDICATIONS:anemia, heme positive stool MEDICATIONS: see EGD for total amount of medication given  DESCRIPTION OF PROCEDURE:   After the risks benefits and alternatives of the procedure were thoroughly explained, informed consent was obtained.       The Pentax Ped Colon [id number]  endoscope was introduced through the anus and advanced to the proximal ascending colon     . No adverse events experienced.   The quality of the prep was     The instrument was then slowly withdrawn as the colon was fully examined.  The colon was very tortuous and looped. The ascending colon looked normal. the transverse colon was normal. The descending colon sigmoid and rectum were normal.    The time to cecum=  .  Withdrawal time=  .  The scope was withdrawn and the procedure completed. COMPLICATIONS: There were no complications.  ENDOSCOPIC IMPRESSION:normal colonoscopy to the proximal ascending colon.  RECOMMENDATIONS:the cecum was not seen on this examination. Given her overall clinical picture however I don't think that we need to do a barium enema, as I don't think it will change the overall outcome.   eSigned:  Wandalee Ferdinand, MD 02/11/2013 11:10 AM   cc:

## 2013-02-11 NOTE — Progress Notes (Signed)
IP PROGRESS NOTE  Subjective:   She is well-known to me with a history of metastatic breast cancer. She presented on 02/09/2013 with acute onset nausea/vomiting and abdominal pain. She was noted to have severe anemia. Her symptoms have resolved. She has been transfused with packed red blood cells. The stool was Hemoccult positive on 1 of multiple evaluations.  [name] has completed one treatment with Taxol/Herceptin/pertuzumab for treatment of HER-2 positive metastatic breast cancer. She developed nausea and vomiting prior to cycle 2 and was diagnosed with an isolated parietal brain metastasis. She was placed on Decadron and completed stereotactic radiosurgery treatment to the brain lesion on 02/03/2013.  She has stated repeatedly that she does not wish to consider further systemic therapy. She stated this again today.  She has severe back pain prior to the first cycle of systemic therapy. This has improved.  Objective: Vital signs in last 24 hours: Blood pressure 151/73, pulse 83, temperature 99.3 F (37.4 C), temperature source Oral, resp. rate 20, height 5\' 8"  (1.727 m), weight 171 lb (77.565 kg), SpO2 97.00%.  Intake/Output from previous day: 08/14 0701 - 08/15 0700 In: 2190 [P.O.:590; I.V.:1600] Out: -   Physical Exam:  HEENT: Neck without mass Lungs: Decreased breath sounds at the left lower chest, no respiratory distress Cardiac: Regular rate and rhythm Abdomen: No hepatomegaly, nontender, no mass Extremities: The left lower leg is larger than the right side, no edema Musculoskeletal: Is status post left mastectomy. There is a 2 cm area of induration at the left anterior chest wall with a 1 cm central ulceration  Portacath/PICC-without erythema  Lab Results:  Recent Labs  02/10/13 1727 02/10/13 2348  WBC 6.6 8.0  HGB 8.2* 9.1*  HCT 24.2* 27.6*  PLT 305 384    BMET  Recent Labs  02/09/13 1204 02/10/13 0610  NA 138 140  K 3.3* 3.5  CL 103 106  CO2 29 28   GLUCOSE 101* 88  BUN 10 7  CREATININE 0.76 0.81  CALCIUM 8.3* 8.4    Studies/Results: Ct Head Wo Contrast  02/09/2013   *RADIOLOGY REPORT*  Clinical Data: CVA headache, history of breast cancer  CT HEAD WITHOUT CONTRAST  Technique:  Contiguous axial images were obtained from the base of the skull through the vertex without contrast.  Comparison: 01/19/2013  Findings: No intracranial hemorrhage, mass effect or midline shift. Solitary cystic lesion in the posterior aspect of the left hemisphere again noted measures 4 by 2.4 cm stable in size and appearance from prior exam.  Patchy subcortical white matter decreased attenuation consistent with small vessel ischemic changes again noted.  No acute cortical infarction.  No intraventricular hemorrhage.  IMPRESSION: No significant change.Solitary cystic lesion in the posterior aspect of the left hemisphere again noted measures 4 by 2.4 cm stable in size and appearance from prior exam.  Patchy subcortical white matter decreased attenuation consistent with small vessel ischemic changes again noted.  No acute cortical infarction.  No intraventricular hemorrhage.   Original Report Authenticated By: Natasha Mead, M.D.   Dg Abd Acute W/chest  02/09/2013   *RADIOLOGY REPORT*  Clinical Data: Nausea vomiting.  Abdominal pain.  History breast cancer and asthma.  ACUTE ABDOMEN SERIES (ABDOMEN 2 VIEW & CHEST 1 VIEW)  Comparison: 02/23/2012 chest film.  Findings: Frontal view of the chest demonstrates surgical clips about the left axilla.  Left mastectomy.  Right-sided Port-A-Cath which terminates at the mid to low SVC.  Upper thoracic spine fixation.  Left rotator cuff repair.  Moderate left hemidiaphragm elevation. No pleural effusion or pneumothorax.  Mild tracheal deviation to the right, positional.  Normal heart size.  Left superior mediastinal and adjacent medial left upper lobe soft tissue fullness, grossly similar to the 02/23/2012 exam.  Volume loss at the left lung  base.  Abominal films demonstrate no free intraperitoneal air or significant air fluid levels on upright positioning.  No bowel distention. Distal gas and stool.  IMPRESSION: No acute findings in the abdomen/pelvis.  Similar superior left sided mediastinal and adjacent upper lobe soft tissue fullness.  Incompletely characterized.  Similar left hemidiaphragm elevation.   Original Report Authenticated By: Jeronimo Greaves, M.D.    Medications: I have reviewed the patient's current medications.  Assessment/Plan:  1. Metastatic breast cancer, HER-2 positive-known metastatic disease to the bones, chest, liver, and brain. She is status post 1 cycle of Taxol/Herceptin/pertuzumab. Her pain has improved and the anterior chest wall ulcer appears smaller  2. Status post stereotactic radiosurgery for treatment of an isolated right parietal brain metastasis on 02/03/2013  3. Acute onset nausea/vomiting/abdominal pain-etiology unclear, plan for an endoscopic evaluation today  4. Acute anemia-etiology unclear. I doubt the anemia is related to the systemic therapy.? Occult GI bleeding. Doubt hemolysis. Baseline mild anemia is likely related to metastatic breast cancer involving the bone marrow and chronic disease.  5. Urinary tract infection  Recommendations:  1. GI workup for source of blood loss  2. Monitor hemoglobin and transfuse packed red blood cells as needed.  3. [place] hospice referral if she is agreeable  I discussed treatment of the metastatic breast cancer with her again today. She states that she will decline further systemic therapy. I recommend a [facility] hospice referral, but she does not wish to enroll in hospice. The acute anemia could be related to GI blood loss, though no gross bleeding has been noted. She is at risk for GI blood loss in the setting of metastatic breast cancer and recent steroid use.  Please call oncology as needed over the weekend. I will check on her 02/14/2013.    LOS: 2 days   [name] [name], [name] [name]  02/11/2013, 7:51 AM

## 2013-02-12 DIAGNOSIS — K279 Peptic ulcer, site unspecified, unspecified as acute or chronic, without hemorrhage or perforation: Secondary | ICD-10-CM

## 2013-02-12 DIAGNOSIS — I82409 Acute embolism and thrombosis of unspecified deep veins of unspecified lower extremity: Secondary | ICD-10-CM

## 2013-02-12 DIAGNOSIS — M7989 Other specified soft tissue disorders: Secondary | ICD-10-CM

## 2013-02-12 DIAGNOSIS — C50919 Malignant neoplasm of unspecified site of unspecified female breast: Secondary | ICD-10-CM

## 2013-02-12 DIAGNOSIS — Z515 Encounter for palliative care: Secondary | ICD-10-CM

## 2013-02-12 LAB — CBC
HCT: 24.8 % — ABNORMAL LOW (ref 36.0–46.0)
MCH: 29.1 pg (ref 26.0–34.0)
MCHC: 33.5 g/dL (ref 30.0–36.0)
MCV: 87 fL (ref 78.0–100.0)
RDW: 15.4 % (ref 11.5–15.5)

## 2013-02-12 MED ORDER — ALPRAZOLAM 0.5 MG PO TABS
0.5000 mg | ORAL_TABLET | Freq: Three times a day (TID) | ORAL | Status: DC | PRN
Start: 2013-02-12 — End: 2013-02-26

## 2013-02-12 MED ORDER — HEPARIN SOD (PORK) LOCK FLUSH 100 UNIT/ML IV SOLN
500.0000 [IU] | INTRAVENOUS | Status: AC | PRN
  Administered 2013-02-12: 500 [IU]

## 2013-02-12 MED ORDER — PANTOPRAZOLE SODIUM 40 MG PO TBEC: 40.0000 mg | DELAYED_RELEASE_TABLET | Freq: Every day | ORAL | Status: DC

## 2013-02-12 MED ORDER — ALPRAZOLAM 0.5 MG PO TABS: 0.5000 mg | ORAL_TABLET | Freq: Three times a day (TID) | ORAL | Status: DC | PRN

## 2013-02-12 NOTE — Progress Notes (Signed)
On discharge of [name] [name], swelling of the LUE was noticed, different finding from previous physical examinations. A UE venous doppler was ordered, showing a new DVT in the left axillary and brachial veins and SVT noted in the left basilic vein. Given the patient's goals of care and quality of life at this point, I discussed the case w/ pharmacy involving outpatient therapies for DVT, and w/ Dr. Phillips Odor about the proper management for [name] [name]. Given her recent admission for significant UGIB 2/2 a gastric ulcer, anticoagulation on an outpatient basis leaves a very large risk of further bleeding. The patient was discharged home as this was best regarding her quality of life as she was very upset at the prospect of having to spend more time in the hospital. I will further discuss possible anticoagulation in the future w/ Dr. Alexandria Lodge to discuss w/ the patient at her [facility] appointment for next week. [facility] referral is being made as well for future palliative care and hospice needs. Dr. Phillips Odor agreed with this plan.

## 2013-02-12 NOTE — Progress Notes (Signed)
   CARE MANAGEMENT NOTE 02/12/2013  Patient:  [name] [name], [name] [name]   Account Number:  [id number]  Date Initiated:  02/10/2013  Documentation initiated by:  Letha Cape  Subjective/Objective Assessment:   dx normocytic anemia  admit- from home.  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     Action/Plan:   Anticipated DC Date:  02/13/2013   Anticipated DC Plan:  HOME W HOME HEALTH SERVICES      DC Planning Services  CM consult      [facility] Choice  HOME HEALTH   Choice offered to / List presented to:  C-1 Patient        HH arranged  HH-1 RN  HH-6 SOCIAL WORKER  HH-4 NURSE'S AIDE      HH agency  [facility]   Status of service:  Completed, signed off Medicare Important Message given?   (If response is "NO", the following Medicare IM given date fields will be blank) Date Medicare IM given:   Date Additional Medicare IM given:    Discharge Disposition:  HOME W HOME HEALTH SERVICES  Per UR Regulation:  Reviewed for med. necessity/level of care/duration of stay  If discussed at Long Length of Stay Meetings, dates discussed:    Comments:  02/12/2013 1600 NCM spoke to pt and offered choice for [facility]. Pt agreeable to [facility].  [facility] notified of HH needed and additional PCS hours needed. Isidoro Donning RN CCM Case Mgmt phone [phone]  02/11/13 09:22 CM waiting for results from Palliative Care with family and MD this am for discharge disposition.  CM will continue to follow for discharge needs. Freddy Jaksch, BSN, CM  02/10/13 14:20 Letha Cape RN,BSN [phone] patient is from home, Patient has an aide with [employer]. patient with normocytic anemia, ? GIB , NCM will continue to follow for dc needs.

## 2013-02-12 NOTE — Progress Notes (Signed)
Subjective: [name] is a 67 y.o. female with PMH significant for metastatic breast cancer undergoing palliative radiation for brain mets (last treatment 02/03/13), HTN who presents as a transfer from [facility]. She reports a 1 week history of nausea and non-bloody, non bilious emesis as well abdominal pain. Blood work done in the [facility] ED was significant for a hemoglobin of 5.1, HCT 16, MCV 89.  Patient seen at bedside this AM. Says she is feeling much better today and wants to go home. Denies any pain. No headache, abdominal pain, nausea, vomiting, or diarrhea. Shay she had a normal BM this AM that was normal, brown in color. No blood or melena.   Her Hb trend is as follows: 5.1 (@[place]  Long)-->7.8 (s/p 2 units PRBCs)-->6.9-->5.6-->8.2-->9.1-->8.7-->9.9-->8.5-->  8.3 this AM.  EGD yesterday showed 3 cm gastric ulcer in the upper body of the stomach. Started on protonix yesterday.  Palliative care saw the patient today to discuss goals of care. Says she wants to live out her life as comfortably as possible and will agree to hospice care when the time comes. Says she is ready to go home today. Her niece was with her this AM and said she will be taking her to her family in [place] for a couple days and then will return hoe to the care of her [facility]  Objective: Vital signs in last 24 hours: Filed Vitals:   02/11/13 1350 02/11/13 1500 02/11/13 2129 02/12/13 0507  BP:  138/68 157/79 123/67  Pulse: 136 83 91 89  Temp:  98.6 F (37 C)  99.2 F (37.3 C)  TempSrc:  Oral Oral Oral  Resp: 12 18 18 20   Height:      Weight:      SpO2: 93% 96% 96% 89%   Weight change:   Intake/Output Summary (Last 24 hours) at 02/12/13 0915 Last data filed at 02/11/13 2200  Gross per 24 hour  Intake      0 ml  Output    120 ml  Net   -120 ml   Physical Exam: General: Alert, cooperative, and in no apparent distress HEENT: Vision grossly intact, oropharynx clear and non-erythematous    Neck: Full range of motion without pain, supple, no lymphadenopathy or carotid bruits Lungs: Clear to ascultation bilaterally, normal work of respiration, no wheezes, rales, ronchi Heart: Regular rate and rhythm, no murmurs, gallops, or rubs Abdomen: Soft, non-tender, non-distended, normal bowel sounds Extremities: No cyanosis, clubbing, or edema Neurologic: Alert & oriented X3, cranial nerves II-XII intact, strength grossly intact, sensation intact to light touch  Lab Results: Basic Metabolic Panel:  Recent Labs Lab 02/09/13 1204 02/10/13 0610  NA 138 140  K 3.3* 3.5  CL 103 106  CO2 29 28  GLUCOSE 101* 88  BUN 10 7  CREATININE 0.76 0.81  CALCIUM 8.3* 8.4   Liver Function Tests:  Recent Labs Lab 02/09/13 1204 02/10/13 0610  AST 7  --   ALT 6  --   ALKPHOS 57  --   BILITOT 0.2* 0.4  PROT 5.7*  --   ALBUMIN 2.2*  --     Recent Labs Lab 02/09/13 1204  LIPASE 11   No results found for this basename: AMMONIA,  in the last 168 hours CBC:  Recent Labs Lab 02/09/13 1204  02/11/13 2335 02/12/13 0600  WBC 7.1  < > 6.9 6.4  NEUTROABS 5.1  --   --   --   HGB 5.1*  < >  8.5* 8.3*  HCT 16.0*  < > 25.7* 24.8*  MCV 88.9  < > 88.0 87.0  PLT 381  < > 291 305  < > = values in this interval not displayed.  Anemia Panel:  Recent Labs Lab 02/10/13 0610  VITAMINB12 504  FOLATE 7.1  FERRITIN 53  TIBC 232*  IRON 17*  RETICCTPCT 2.6   Urine Drug Screen: Drugs of Abuse  No results found for this basename: labopia,  cocainscrnur,  labbenz,  amphetmu,  thcu,  labbarb    Alcohol Level: No results found for this basename: ETH,  in the last 168 hours Urinalysis:  Recent Labs Lab 02/09/13 1323  COLORURINE YELLOW  LABSPEC 1.021  PHURINE 5.5  GLUCOSEU NEGATIVE  HGBUR NEGATIVE  BILIRUBINUR NEGATIVE  KETONESUR NEGATIVE  PROTEINUR NEGATIVE  UROBILINOGEN 1.0  NITRITE NEGATIVE  LEUKOCYTESUR MODERATE*    Micro Results: Recent Results (from the past 240 hour(s))   URINE CULTURE     Status: None   Collection Time    02/09/13  1:23 PM      Result Value Range Status   Specimen Description URINE, CLEAN CATCH   Final   Special Requests NONE   Final   Culture  Setup Time     Final   Value: 02/09/2013 21:06     Performed at [facility] Count     Final   Value: >=100,000 COLONIES/ML     Performed at [facility]   Culture     Final   Value: VIRIDANS STREPTOCOCCUS     Performed at [facility]   Report Status 02/10/2013 FINAL   Final   Studies/Results: No results found. Medications: I have reviewed the patient's current medications. Scheduled Meds: . docusate sodium  100 mg Oral BID  . feeding supplement  237 mL Oral TID BM  . feeding supplement  1 Container Oral TID BM  . pantoprazole (PROTONIX) IV  40 mg Intravenous Q24H  . senna  1 tablet Oral BID  . sodium chloride  3 mL Intravenous Q12H   Continuous Infusions: . sodium chloride 100 mL/hr at 02/12/13 0413   PRN Meds:.albuterol, morphine Assessment/Plan: [name] is a 67 y.o. female with PMH significant for metastatic breast cancer undergoing palliative radiation for brain mets (last treatment 02/03/13), HTN who presents as a transfer from [facility], with a chief complaint of nausea but found to be profoundly anemic.   #Normocytic anemia: Acute onset anemia with Hgb 5.0 on presentation, prior Hgb 10.2 on 01/21/13. She denies any recent traumas, blood in her stool or on the toilet paper, hematuria, or hematemesis. She is receiving radiation therapy for her brain metastases and recently stopped chemotherapy 2/2 to side effects (nausea). She was transfused 2u pRBCs in the ED, and her repeat Hgb was 7.8. Hb trend is as follows: 5.1 (@[place]  Long)-->7.8 (s/p 2 units PRBCs)-->6.9-->5.6-->8.2-->9.1-->8.7-->9.9-->8.5-->  8.3 this AM - FOBT on 02/10/13 found to be +ve. GI consulted, patient went for EGD/colonoscopy yesterday. Found a large 3 cm gastric ulcer  located in the upper body of the stomach, explaining her rapid drop in Hb on admission. Colonoscopy normal. -Started on Protonix 40 mg IV qd for now, as per GI recs. Will continue on 40 mg bid protonix at discharge. - Anemia panel shows Fe of 17, Ferritin 53 of and TIBC of 232. MCV is currently 87.6. Most likely, acute decrease in Hb is d/t acute blood loss, however a mixed picture of  blood loss, ACD, and iron deficiency anemia is most likely.  #Metastatic breast cancer: Recently received palliative radiation therapy for her brain metastasis as well as only one treatment w/ Taxol and Herceptin. The patient is refusing further treatment and is now wishing to be DNR. Seen by Dr. Truett Perna today. She is still refusing further chemotherapy or radiation, despite her more treatable form of breast ca given her HER-2 NEU +ve malignancy. - Palliative care saw the patient today to discuss goals of care. Says she wants to live out her life as comfortably as possible and will agree to hospice care when the time comes. Will discuss with case manager to set up [facility] palliative care to visit her at home to discuss future hospice care. Patient is DNR. - Continuing home morphine MS-IR 15-30mg  po q4h PRN  - Heart healthy diet  - Ensure TID between meals  - Normal saline @ 100cc/hr while with poor po intake   #Nausea - Thought to be due to increased ICP from brain mets.  - Zofran 4mg  q6-8h PRN   #HTN: BP stable on admission. No home BP meds. Will continue to monitor.   #DVT PPx: SCDs; held chemical prophylaxis 2/2 anemia.  Dispo: Disposition is deferred at this time, awaiting improvement of current medical problems.  Anticipated discharge today.   The patient does have a current PCP Lorretta Harp, MD) and does need an [facility] hospital follow-up appointment after discharge.  The patient does not have transportation limitations that hinder transportation to clinic appointments.  .Services Needed at time of discharge:  Y = Yes, Blank = No PT: N  OT: N  RN: Y  Equipment: N  Other: N    LOS: 3 days   Lars Masson, MD 02/12/2013, 9:15 AM Pager: [phone]

## 2013-02-12 NOTE — Progress Notes (Signed)
CSW discussed patient with rn cm regarding patient returning home with hospice. RN CM ainquired to see if patient would need non emergency ambulance transportation home. CSW spoke with pt rn, who stated that patient is ambulatory and will not need a non emergency mabulance and should be able to be transported home by family. CSW will inform RN CM.   Marland KitchenCatha Gosselin, LCSWA weekend covering CSW [phone]

## 2013-02-12 NOTE — Progress Notes (Signed)
[name] [name] to be D/C'd Home with [facility] per MD order.  Discussed with the patient and all questions fully answered.    Medication List         albuterol 108 (90 BASE) MCG/ACT inhaler  Commonly known as:  PROVENTIL HFA;VENTOLIN HFA  Inhale 2 puffs into the lungs every 6 (six) hours as needed. Shortness of breath     ALPRAZolam 0.5 MG tablet  Commonly known as:  XANAX  Take 1 tablet (0.5 mg total) by mouth 3 (three) times daily as needed for anxiety or sleep.     morphine 15 MG tablet  Commonly known as:  MSIR  Take 15-30 mg by mouth every 4 (four) hours as needed for pain.     ondansetron 8 MG tablet  Commonly known as:  ZOFRAN  Take 1 tablet (8 mg total) by mouth every 8 (eight) hours as needed for nausea.     pantoprazole 40 MG tablet  Commonly known as:  PROTONIX  Take 1 tablet (40 mg total) by mouth daily.     SUMAtriptan 50 MG tablet  Commonly known as:  IMITREX  Take 50 mg by mouth every 2 (two) hours as needed for migraine.        VVS, Skin clean, dry and intact without evidence of skin break down, no evidence of skin tears noted. Port a cath deaccessed by IV team. An After Visit Summary was printed and given to the patient. Follow up appointments , new prescriptions and medication administration times given Patient escorted via WC, and D/C home via private auto.  Cindra Eves, RN 02/12/2013 5:27 PM

## 2013-02-12 NOTE — Progress Notes (Signed)
I have discussed the newly diagnosed axillary DVT in [name] [name]'s left arm with Dr. Yetta Barre IM PGY-1. Based on my discussiion with [name] [name] and her stated goals I recommend the following:  1. Discharge Home today. Patient desires to go home and this is a top priority for her QOL. 2. Needs to be seen in the [facility] within 1 week for hospital f/u and to make sure that home resources have been put into place. 3. Since she is recently s/p large upper GI from a gastric ulcer I would recommend consulting Dr. Chancy Milroy in [facility] anticoagulation clinic regarding consensus on initiation of anticoagulation following GIB and discuss options with patient-will probably be a risk benefit discussion. Main concern is that DVT will cause aching and pain as it enlarges. 4. I will continue to follow and make sure referral is made to [facility].  Anderson Malta, DO Palliative Medicine

## 2013-02-12 NOTE — Progress Notes (Signed)
Weekend CSW met with patient and patient's family at bedside to discuss HCPOA documents.  Samuella Bruin, LCSWA [facility] Emergency Dept. [phone]

## 2013-02-12 NOTE — Progress Notes (Signed)
VASCULAR LAB PRELIMINARY  PRELIMINARY  PRELIMINARY  PRELIMINARY  Left upper extremity venous Doppler completed.    Preliminary report:  There is acute DVT noted in the left axillary and brachial veins and SVT noted in the left basilic veins.  [name] [name], RVT 02/12/2013, 3:48 PM

## 2013-02-12 NOTE — Progress Notes (Signed)
Palliative Medicine Team at [facility]  Date: 02/12/2013   Patient Name: [name] [name]  DOB: 20-Jan-1946  MRN: [id number]  Age / Sex: 67 y.o., female   PCP: Lorretta Harp, MD Referring Physician: Judyann Munson, MD  HPI/Reason for Consultation: 67 yo with metastatic stage IV breast cancer admitted with acute upper GI bleed from large gastric ulcer. She has been undergoing chemotherapy and radiation but has elected to discontinue systemic and radiation treatments and focus on a more palliative and comfort oriented approach. PMT consulted to assist with resources.  Participants in Discussion: Ms. Savary and her niece, she has two sons but they are not available to assist, one lives in [place] and the other in [place].  Goals/Summary of Discussion:  1. Code Status:  DNR, she should be given YELLOW/GOLDEN ROD form to take with her home and post it in a visible location in her house.  2. Scope of Treatment:  Primary goal is comfort "I want to live out my time feeling good and not think about the cancer and when the lord is ready for me he will take me out of here"  She wants to be as independent as possible for as long as possible, does not want to "be confined". Wants to be in her home.  Treatment for reversible condition, she reports being adherent with her medications.  3. Assessment/Plan: Ms. Demmon has asserted that she does not want any additional chemotherapy or radiation for treatment of her breast ca, she is able to articulate that she understands that without palliative treatment  her cancer may progress and she may actually have worsening pain due tumor burden. She also understands that her tumor did respond to treatment, but she reiterates that the chemotherapy makes her feel horrible and sick and that she does not want to undergo the radiation anymore.   Primary Diagnoses  1. HER2+ metastatic breast cancer-brain, bone-extensive disease chest, liver, bone-currently she denies  pain which is remarkable give her disease progression. 2. HTN, chronic 3. Acute GI Gastric Ulcer 4. She has a left anterior chest wall ulcer, anticipate this may get worse and need attention at home. 5. Brain mets, possibly causing emotional lability, nothing focal lesion is very isolated in parietal lobe  Plan/Recs:  Continue Morphine IR for Pain/Dyspnea  Add on Alprazolam for anxiety  She will need Home RN attention for her left mastectomy ulcer, which is prone to bleeding, cancer progression  Request increase in hours from [facility] aides in home.  Unfortunately she cannot maintain her PCS services and also get the hospice benefit through medicare typically, will have CM look into this.   While she needs hospice care the best solution would be to refer her to [facility] who can see her at home and transition her to full hospice when she is ready.  She needs to complete HCPOA paperwork, this will be very important and I have discussed this with her in detail. Ideally I would like this done prior to her leaving hospital, but if not completed perhaps PPCS can help with this documentation.  Despite GI ulcer I would continue the decadron along with high dose PPI therapy and empiric treatment for H. Pylori until Clo/biopsy results return.    Prognosis: <6 months  PPS: 60%    Active Symptoms 1. Chest Wall Ulcer 2. Gastric Ulcer/Abdonminal Pain 3. Weakness 4. Anxiety/Grief  4. Palliative Prophylaxis:   Bowel Regimen -yes  Terminal Secretions- NA  Breakthrough Pain and Dyspnea- yes  Agitation and Delirium-will add on benzodiazapine  Nausea-yes  5. Psychosocial Spiritual Asssessment/Interventions:  Patient and Family Adjustment to Illness/Prognosis: Patient very tearful during my discussion, she is afraid and she does not want to be a burden on other people. She has not told her family how advanced her disease really is at this point.  Spiritual  Concerns or Needs: Will consult chaplain  6. Disposition: Home with PCS services, home health RN and [facility] Palliative Care services referral.  ROS:  Per symptoms above. Weight loss. Weakness. Social History:   reports that she has been smoking Cigarettes.  She has a 25 pack-year smoking history. She has never used smokeless tobacco. She reports that she uses illicit drugs (Oxycodone). She reports that she does not drink alcohol. Living Situation: home independently Occupation: disabled  Family History: Family History  Problem Relation Age of Onset  . Anesthesia problems Neg Hx   . Hypotension Neg Hx   . Malignant hyperthermia Neg Hx   . Pseudochol deficiency Neg Hx     Active Medications:  Outpatient medications: Prescriptions prior to admission  Medication Sig Dispense Refill  . albuterol (PROVENTIL HFA;VENTOLIN HFA) 108 (90 BASE) MCG/ACT inhaler Inhale 2 puffs into the lungs every 6 (six) hours as needed. Shortness of breath  18 g  5  . morphine (MSIR) 15 MG tablet Take 15-30 mg by mouth every 4 (four) hours as needed for pain.      [name] ondansetron (ZOFRAN) 8 MG tablet Take 1 tablet (8 mg total) by mouth every 8 (eight) hours as needed for nausea.  30 tablet  2  . SUMAtriptan (IMITREX) 50 MG tablet Take 50 mg by mouth every 2 (two) hours as needed for migraine.        Current medications: Infusions: . sodium chloride 100 mL/hr at 02/12/13 0413    Scheduled Medications: . docusate sodium  100 mg Oral BID  . feeding supplement  237 mL Oral TID BM  . feeding supplement  1 Container Oral TID BM  . pantoprazole (PROTONIX) IV  40 mg Intravenous Q24H  . senna  1 tablet Oral BID  . sodium chloride  3 mL Intravenous Q12H    PRN Medications: albuterol, morphine   Vital Signs: BP 123/67  Pulse 89  Temp(Src) 99.2 F (37.3 C) (Oral)  Resp 20  Ht 5\' 8"  (1.727 m)  Wt 77.565 kg (171 lb)  BMI 26.01 kg/m2  SpO2 89%   Physical Exam:  AOX3, weak, pale. Tearful. Slightly  anxious. Skin is dry but no rashes. Small left chest wall ulcer Abdomen is soft, mild epigastric tenderness Lungs decreased breath sounds in bases +Le edema Labs:  Basic or Comprehensive Metabolic Panel:    Component Value Date/Time   NA 140 02/10/2013 0610   NA 137 01/19/2013 1114   K 3.5 02/10/2013 0610   K 3.7 01/19/2013 1114   CL 106 02/10/2013 0610   CL 107 10/14/2012 1047   CO2 28 02/10/2013 0610   CO2 28 01/19/2013 1114   BUN 7 02/10/2013 0610   BUN 24.3 01/19/2013 1114   CREATININE 0.81 02/10/2013 0610   CREATININE 1.1 01/19/2013 1114   CREATININE 0.89 10/23/2010 1514   GLUCOSE 88 02/10/2013 0610   GLUCOSE 164* 01/19/2013 1114   GLUCOSE 87 10/14/2012 1047   CALCIUM 8.4 02/10/2013 0610   CALCIUM 9.6 01/19/2013 1114   AST 7 02/09/2013 1204   AST 9 01/19/2013 1114   ALT 6 02/09/2013 1204   ALT 8 01/19/2013 1114  ALKPHOS 57 02/09/2013 1204   ALKPHOS 75 01/19/2013 1114   BILITOT 0.4 02/10/2013 0610   BILITOT 0.27 01/19/2013 1114   PROT 5.7* 02/09/2013 1204   PROT 6.6 01/19/2013 1114   ALBUMIN 2.2* 02/09/2013 1204   ALBUMIN 3.0* 01/19/2013 1114     CBC:    Component Value Date/Time   WBC 6.4 02/12/2013 0600   WBC 7.9 01/21/2013 1439   HGB 8.3* 02/12/2013 0600   HGB 10.2* 01/21/2013 1439   HCT 24.8* 02/12/2013 0600   HCT 31.0* 01/21/2013 1439   PLT 305 02/12/2013 0600   PLT 306 01/21/2013 1439   MCV 87.0 02/12/2013 0600   MCV 89.0 01/21/2013 1439   NEUTROABS 5.1 02/09/2013 1204   NEUTROABS 5.6 01/21/2013 1439   LYMPHSABS 1.2 02/09/2013 1204   LYMPHSABS 1.2 01/21/2013 1439   MONOABS 0.7 02/09/2013 1204   MONOABS 1.0* 01/21/2013 1439   EOSABS 0.1 02/09/2013 1204   EOSABS 0.0 01/21/2013 1439   BASOSABS 0.0 02/09/2013 1204   BASOSABS 0.0 01/21/2013 1439     Other Data:  (2D echo, EKG...)   Educational Materials Given:  DNR: Yes  MOST: No Healthcare Power-of-Attorney: Yes    Time: 70 minutes Greater than 50%  of this time was spent counseling and coordinating care related to the above  assessment and plan.  Signed by: Edsel Petrin, DO  02/12/2013, 8:50 AM  Please contact Palliative Medicine Team phone at [phone] for questions and concerns.

## 2013-02-13 NOTE — Progress Notes (Signed)
02/13/2013 1045 Received call from [facility] and they will complete PCS and have PCP sign for additional hours. NCM notified Dr. Yetta Barre with update. Isidoro Donning RN CCM Case Mgmt phone [phone]

## 2013-02-14 ENCOUNTER — Other Ambulatory Visit: Payer: Self-pay | Admitting: Internal Medicine

## 2013-02-14 ENCOUNTER — Encounter (HOSPITAL_COMMUNITY): Payer: Self-pay | Admitting: Gastroenterology

## 2013-02-14 ENCOUNTER — Other Ambulatory Visit: Payer: Self-pay | Admitting: *Deleted

## 2013-02-14 DIAGNOSIS — C7931 Secondary malignant neoplasm of brain: Secondary | ICD-10-CM

## 2013-02-14 MED ORDER — CLARITHROMYCIN 500 MG PO TABS: 500.0000 mg | ORAL_TABLET | Freq: Two times a day (BID) | ORAL | Status: DC

## 2013-02-14 MED ORDER — AMOXICILLIN 500 MG PO TABS: 500.0000 mg | ORAL_TABLET | Freq: Two times a day (BID) | ORAL | Status: DC

## 2013-02-14 NOTE — Discharge Summary (Signed)
Name: [name] [name] MRN: [id number] DOB: [birth year] 68 y.o. PCP: Lorretta Harp, MD  Date of Admission: 02/09/2013 10:56 AM Date of Discharge: 02/12/13 Attending Physician: Dr. Judyann Munson Discharge Diagnosis: 1. Blood loss anemia 2/2 UGIB- Patient presented to [facility] w/ Hb of 5.1. Transfused w/ 2 units PRBC's. FOBT +ve. EGD on 02/11/13 showed large 3 cm gastric ulcer. Hb stabilized, discharged on Protonix 40 mg po bid 2. PUD- see above 3. Metastatic L breast ca- Pt of Dr. Truett Perna. Refusing further treatment. Seen by palliative care during admission. Planning for hospice care in the future. 4. LUE DVT- UE doppler performed just prior to discharge, showed axillary DVT. Discharged home w/out anticoagulation given her goals of care and recent UGIB. Discussed situation at length w/ Dr. Phillips Odor and Dr. Alexandria Lodge.  5. Nausea 6. HTN  Discharge Medications:   Medication List         albuterol 108 (90 BASE) MCG/ACT inhaler  Commonly known as:  PROVENTIL HFA;VENTOLIN HFA  Inhale 2 puffs into the lungs every 6 (six) hours as needed. Shortness of breath     ALPRAZolam 0.5 MG tablet  Commonly known as:  XANAX  Take 1 tablet (0.5 mg total) by mouth 3 (three) times daily as needed for anxiety or sleep.     morphine 15 MG tablet  Commonly known as:  MSIR  Take 15-30 mg by mouth every 4 (four) hours as needed for pain.     ondansetron 8 MG tablet  Commonly known as:  ZOFRAN  Take 1 tablet (8 mg total) by mouth every 8 (eight) hours as needed for nausea.     pantoprazole 40 MG tablet  Commonly known as:  PROTONIX  Take 1 tablet (40 mg total) by mouth daily.     SUMAtriptan 50 MG tablet  Commonly known as:  IMITREX  Take 50 mg by mouth every 2 (two) hours as needed for migraine.        Disposition and follow-up:   [name] [name] was discharged from [facility] in good condition.  At the hospital follow up visit please address:  1.  Discussion w/ [facility]  Services regarding future hospice care, status of her left arm swelling/DVT, abdominal pain, nausea, vomiting.   2.  Labs / imaging needed at time of follow-up: none  3.  Pending labs/ test needing follow-up: none  Follow-up Appointments:     Follow-up Information   Please follow up. (Internal Medicine clinc will contact you regarding your hospital follow up appointment in the next 2 weeks.)       Follow up with [facility] RN and Social Worker)    Contact information:   [phone]      Discharge Instructions: Discharge Orders   Future Appointments Provider Department Dept Phone   02/15/2013 1:15 PM Lorretta Harp, MD [facility] [phone]   02/16/2013 11:00 AM Sherrie Mustache [facility] MED[facility] [phone]   02/16/2013 11:30 AM [facility] MED[facility] [phone]   03/07/2013 10:30 AM Oneita Hurt, MD [facility] RADIATION ONCOLOGY [phone]   Future Orders Complete By Expires   Call MD for:  persistant dizziness or light-headedness  As directed    Call MD for:  persistant nausea and vomiting  As directed    Call MD for:  severe uncontrolled pain  As directed    Call MD for:  temperature >100.4  As directed    Diet - low sodium heart healthy  As directed    Increase activity slowly  As directed       Consultations: Treatment Team:  Graylin Shiver, MD Ladene Artist, MD [facility]  Procedures Performed:  Ct Head Wo Contrast  02/09/2013   *RADIOLOGY REPORT*  Clinical Data: CVA headache, history of breast cancer  CT HEAD WITHOUT CONTRAST  Technique:  Contiguous axial images were obtained from the base of the skull through the vertex without contrast.  Comparison: 01/19/2013  Findings: No intracranial hemorrhage, mass effect or midline shift. Solitary cystic lesion in the posterior aspect of the left hemisphere again noted measures 4 by 2.4 cm  stable in size and appearance from prior exam.  Patchy subcortical white matter decreased attenuation consistent with small vessel ischemic changes again noted.  No acute cortical infarction.  No intraventricular hemorrhage.  IMPRESSION: No significant change.Solitary cystic lesion in the posterior aspect of the left hemisphere again noted measures 4 by 2.4 cm stable in size and appearance from prior exam.  Patchy subcortical white matter decreased attenuation consistent with small vessel ischemic changes again noted.  No acute cortical infarction.  No intraventricular hemorrhage.   Original Report Authenticated By: Natasha Mead, M.D.   Ct Head W Contrast  01/19/2013   *RADIOLOGY REPORT*  Clinical Data: Nausea with refractory vomiting.  History of breast carcinoma.  Evaluate for metastatic disease.  CT HEAD WITH CONTRAST  Technique:  Contiguous axial images were obtained from the base of the skull through the vertex with intravenous contrast  Contrast: 80mL OMNIPAQUE IOHEXOL 300 MG/ML  SOLN  Comparison: 12/24/2011  Findings: There is a new oval mass-like area of hypoattenuation in the left parietal lobe measuring 39 mm x 23 mm in size. It mildly effaces the overlying sulci but causes no midline shift.  There is no convincing associated enhancement with this.  It is homogeneously low attenuation with average Hounsfield units of 13.8.  It appears cystic.  There are mild areas of subcortical white matter hypoattenuation that are consistent with chronic microvascular ischemic change. These are stable.  There are no other mass-like lesions.  There are no areas of abnormal parenchymal enhancement. There are is no evidence of a recent transcortical infarct.  The ventricles are normal in size and configuration.  There are no extra-axial masses or abnormal fluid collections. There is no abnormal extra-axial enhancement.  There is a stable mucous retention cyst in the right maxillary sinus.  The remaining sinuses are clear.   No skull lesion is seen.  IMPRESSION: Cystic mass has developed in the left parietal lobe.  It shows no convincing enhancement, but given the provided history and the interval change, this is still worrisome for metastatic disease. Recommend follow-up brain MRI with without contrast.  No other masses or changes from the prior study.  Mild chronic microvascular ischemic changes stable from the prior exam.  No hydrocephalus.  No areas of abnormal enhancement.   Original Report Authenticated By: Amie Portland, M.D.   Mr Laqueta Jean Wo Contrast  01/21/2013   *RADIOLOGY REPORT*  Clinical Data: 67 year old female with history of metastatic breast cancer, originally diagnosed in 1990s, with multiple episodes of recurrence in recent years.  History of radiation treatment of metastases to the thoracic spine.  Recently discovered left parietal brain mass compatible with new metastatic disease to the brain.  Study for staging and stereotactic treatment planning is requested.  MRI HEAD WITHOUT AND WITH CONTRAST  Technique:  Multiplanar, multiecho pulse sequences of the brain and surrounding structures were obtained according to standard protocol without and with intravenous contrast  Contrast: 15mL MULTIHANCE GADOBENATE DIMEGLUMINE 529 MG/ML IV SOLN  Comparison: Head CTs 01/19/2013 and earlier.  Findings: Rim enhancing predominately cystic mass in the posterior left hemisphere re-identified.  This measures 40 x 26 x 35 mm (AP by transverse by CC).  Only mild mass effect and little to no surrounding cerebral edema.  Mild motion artifact on postcontrast images.  No additional metastatic disease identified.  No ventriculomegaly.  Basilar cisterns are patent. No acute intracranial hemorrhage identified.  No restricted diffusion or evidence of acute infarct.  Widespread scattered cerebral white matter T2 and FLAIR hyperintensity is nonspecific, chronic, and stable to mildly progressed since 2012. Major intracranial vascular flow voids  are stable.  Negative pituitary, cervicomedullary junction and visualized cervical spine.  Bone marrow signal in the skull and in the visualized upper cervical vertebrae is normal.  Visualized orbit soft tissues are within normal limits.  Right maxillary sinus mucous retention cyst is unchanged.  Other Visualized paranasal sinuses and mastoids are clear.  Negative scalp soft tissues.  IMPRESSION: 1.  Solitary cystic and rim-enhancing mass in the left posterior hemisphere compatible with brain metastasis measuring 26 x 40 x 35 mm.  Mild associated mass effect and no significant cerebral edema. 2.  No additional metastatic disease identified. 3.  Chronic stable to mildly progressed nonspecific white matter signal changes.   Original Report Authenticated By: Erskine Speed, M.D.   Dg Abd Acute W/chest  02/09/2013   *RADIOLOGY REPORT*  Clinical Data: Nausea vomiting.  Abdominal pain.  History breast cancer and asthma.  ACUTE ABDOMEN SERIES (ABDOMEN 2 VIEW & CHEST 1 VIEW)  Comparison: 02/23/2012 chest film.  Findings: Frontal view of the chest demonstrates surgical clips about the left axilla.  Left mastectomy.  Right-sided Port-A-Cath which terminates at the mid to low SVC.  Upper thoracic spine fixation.  Left rotator cuff repair.  Moderate left hemidiaphragm elevation. No pleural effusion or pneumothorax.  Mild tracheal deviation to the right, positional.  Normal heart size.  Left superior mediastinal and adjacent medial left upper lobe soft tissue fullness, grossly similar to the 02/23/2012 exam.  Volume loss at the left lung base.  Abominal films demonstrate no free intraperitoneal air or significant air fluid levels on upright positioning.  No bowel distention. Distal gas and stool.  IMPRESSION: No acute findings in the abdomen/pelvis.  Similar superior left sided mediastinal and adjacent upper lobe soft tissue fullness.  Incompletely characterized.  Similar left hemidiaphragm elevation.   Original Report  Authenticated By: Jeronimo Greaves, M.D.   Admission HPI: Ms. KARMIN KASPRZAK is a 67 y.o. female with PMH significant for metastatic breast cancer undergoing palliative radiation for brain mets (last treatment 02/03/13), HTN who presents as a transfer from [facility]. She reports a 1 week history of nausea and non-bloody, non bilious emesis as well abdominal pain. The abdominal pain is localized to the center of her abdomen, described as "aching," and feels worse with increased pressure such as when she bears down to vomit. Her symptoms have increased over the past 48 hours, to the point where she has been unable to eat or drink. Her last meal was a can of Ensure about 36 hours ago. She has only been drinking sips of water since then. She denies fever, chills, diarrhea, constipation, chest pain, SOB.  Blood work done in the [facility] ED was  significant for a hemoglobin of 5.1, HCT 16, MCV 89. She denies melena, blood in her stool or on the toilet paper, hematemesis, hematuria, hemoptysis. Denies recent trauma.  Ms. Greff has metastatic breast cancer. Her cancer was diagnosed in 1996, ER positive 100%; PR positive 53%; HER-2/neu positive by [facility]. She is status post left mastectomy and axillary node dissection, but unfortunately was lost to follow up before chemotherapy could be completed and had reoccurrence of her cancer in 2010. 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 She began chemotherapy treatment with Taxol/Herceptin/Pertuzumab on 12/29/12. Her last dose was about 2 weeks ago, when she decided to stop therapy due to persistent nausea. She is followed by Dr. Truett Perna, and per his note, her nausea was thought to be at least partially due to increased ICP from brain mets. She is currently receiving palliative radiation therapy to the head for this.  Hospital Course by problem list:  1. Blood loss anemia 2/2 UGIB- Patient presented initially to [facility] w/ acute onset anemia w/ Hb of 5.0. Her prior Hb was 10.2  on 01/21/13. FOBT was initially -ve in the ED. The patient presented to the ED w/ abdominal pain, nausea/vomiting (NB/NB), and decreased appetite. The patient denied any recent trauma, blood in her stool, toilet paper, hematuria or hematemesis. She was given 2 units PRBC's and her Hb trend was as follows:  5.1-->7.8 (after 2 units)-->6.9-->5.6-->8.2-->9.1-->8.7-->9.9-->8.5-->8.3 (@ discharge). She had previously received 1 dose of taxol and herceptin as well as palliative radiation (02/03/13) for her brain metastasis. At this time, she is refusing any further treatment. After discussion w/ her oncologist Dr. Truett Perna, he did not think her previous cancer therapy would cause a Hb drop this significant. When patient arrived on floor, FOBT was repeated and found to be +ve. GI was consulted, patient was made NPO, and an EGD and colonoscopy were performed on 02/11/13, showing a 3 cm gastric ulcer. Biopsies were obtained, still pending. Patient improved significantly during her hospital course. No further transfusions were required. She was given protonix 40 mg IV qd and discharged w/ protonix 40 mg po bid.   2. PUD- Patient presented w/ Hb of 5.0 and found to have FOBT +ve on the floor. She initially complained of abdominal pain, nausea, and NB/NB vomiting, w/ decreased appetite. EGD performed by GI showed 3 cm gastric ulcer, most likely source of her bleeding. Colonoscopy was normal. Started 40 mg protonix IV in the hospital, discharged on protonix 40 mg po bid. She was discharged w/ no abdominal complaints and she was eating a normal diet w/out issues.   3. Metastatic L. Breast CA- Ms. Newhouse is a patient of Dr. Truett Perna at the [facility]. She had previously received 1 dose of taxol and herceptin as well as palliative radiation (02/03/13) for her brain metastasis. Discussed the case at length w/ Dr. Truett Perna and he saw her during her admission. At this time, she is refusing any further treatment, despite her  relatively treatable form of HER2 NEU +ve form of breast ca. She was seen in the hospital by Palliative Care Services and Dr. Phillips Odor and the patient is opting for comfort care and future hospice care at this time. The patient recently desired to be DNR. She will be followed up by palliative care as well as PPCS. Patient has a HHRN that looks after her 7 days a week and her hours were extended on discharge.   4. Left UE DVT- On discharge, it was noticed that the patient's  LUE was swollen, more than the right, a new finding on physical exam. Despite the patient's strong desires to go home, an UE Doppler was ordered, showing acute DVT noted in the left axillary and brachial veins. Given the patient's new goals of care and end of life goals, she strongly desired to go home. On discussion of possibly staying in the hospital to treat her DVT, she started to cry and become extremely agitated. The situation was discussed at length w/ Dr. Phillips Odor and w/ pharmacy. Given her admission d/t UGIB, anticoagulation was not an option on an outpatient basis. Dr. Phillips Odor agreed that the patient can be discharged to the comfort of her own home at this time. Also discussed the case w/ Dr. Alexandria Lodge (anticoagulation) and he agreed that given her malignancy history, recent UGIB, and goals of care, this was the most reasonable clinical decision for Ms. Police, to discharge her at this time, w/ close follow up. She was told to call the [facility] on Monday and schedule a follow-up appointment this week. The patient will also be closely followed by PCS.   5. Nausea - Seems to be an ongoing issue for her. Discussed w/ Dr. Truett Perna and it is thought to be due to increased ICP from brain mets The patient was given Zofran 4mg  q6-8h PRN.  #HTN: BP stable on admission. No home BP meds were given. Restarted on discharge.   Discharge Vitals:   BP 123/67  Pulse 89  Temp(Src) 99.2 F (37.3 C) (Oral)  Resp 20  Ht 5\' 8"  (1.727 m)  Wt 171 lb  (77.565 kg)  BMI 26.01 kg/m2  SpO2 89%  Discharge Labs:  No results found for this or any previous visit (from the past 24 hour(s)).  Signed: Lars Masson, MD 02/14/2013, 9:50 AM  Pager: [phone]  Time Spent on Discharge: 35 minutes Services Ordered on Discharge: none Equipment Ordered on Discharge: none

## 2013-02-15 ENCOUNTER — Telehealth: Payer: Self-pay | Admitting: Oncology

## 2013-02-15 ENCOUNTER — Encounter: Payer: Self-pay | Admitting: Internal Medicine

## 2013-02-15 ENCOUNTER — Ambulatory Visit (INDEPENDENT_AMBULATORY_CARE_PROVIDER_SITE_OTHER): Payer: Commercial Managed Care - HMO | Admitting: Internal Medicine

## 2013-02-15 VITALS — BP 137/83 | HR 95 | Temp 100.1°F | Resp 20 | Ht 67.0 in | Wt 168.2 lb

## 2013-02-15 DIAGNOSIS — I82409 Acute embolism and thrombosis of unspecified deep veins of unspecified lower extremity: Secondary | ICD-10-CM | POA: Insufficient documentation

## 2013-02-15 DIAGNOSIS — K922 Gastrointestinal hemorrhage, unspecified: Secondary | ICD-10-CM

## 2013-02-15 DIAGNOSIS — I82402 Acute embolism and thrombosis of unspecified deep veins of left lower extremity: Secondary | ICD-10-CM

## 2013-02-15 DIAGNOSIS — C50919 Malignant neoplasm of unspecified site of unspecified female breast: Secondary | ICD-10-CM

## 2013-02-15 DIAGNOSIS — R110 Nausea: Secondary | ICD-10-CM

## 2013-02-15 DIAGNOSIS — K279 Peptic ulcer, site unspecified, unspecified as acute or chronic, without hemorrhage or perforation: Secondary | ICD-10-CM

## 2013-02-15 LAB — CBC
HCT: 28.8 % — ABNORMAL LOW (ref 36.0–46.0)
Hemoglobin: 9.7 g/dL — ABNORMAL LOW (ref 12.0–15.0)
MCHC: 33.7 g/dL (ref 30.0–36.0)
RDW: 15 % (ref 11.5–15.5)
WBC: 9.5 10*3/uL (ref 4.0–10.5)

## 2013-02-15 MED ORDER — PROMETHAZINE HCL 12.5 MG PO TABS: 25.0000 mg | ORAL_TABLET | Freq: Two times a day (BID) | ORAL | Status: DC | PRN

## 2013-02-15 NOTE — Assessment & Plan Note (Signed)
This is the end stage of metastasized breast cancer. Patient refused further treatment. She is towared to palliative care. Currently she has a health aid helping her at home. PCS hours are 80 hours/month now.   -will consult to SW to help her to extend PCS hours from 80 to 100 hours/month.

## 2013-02-15 NOTE — Progress Notes (Signed)
[name] [name] ID: [name] [name] [name], female   DOB: October 09, 1945, 67 y.o.   MRN: [id number] Subjective:   [name] [name] ID: [name] [name] [name] female   DOB: 1945/10/23 67 y.o.   MRN: [id number]  CC:   Hospital followup visit.  HPI:  [name] [name] [name] is a 67 y.o. lady with past medical history as outlined below, who presents for a hospital followup visit today.  [name] [name] was recently hospitalized from 8/13-8/16 because of a GI bleeding. She comes back hospital followup today. She is accompanied by her health Aid. Her only complaint is nausea. She said she has been discharged on Zofran for nausea, but it helps very little. Each time when she eats food, she feels nauseated.  1. GIB and PUD: [name] [name] was hospitalized recently because of a GI bleeding. EGD and colonoscopy were performed on 02/11/13, showing a 3 cm gastric ulcer. Biopsies is positive for Helicobacter pylori. She started amoxicillin and clarithromycin treatment. She is also taking Protonix 40 mg twice a day currently. I did a stat CBC which showed hemoglobin 9.7 which is stable (8.3 on discharge).    2. Metastatic L. Breast CA- [name] [name] [name] is a [name] [name] of Dr. Truett Perna at [name]. She was treated with taxol and herceptin as well as palliative radiation (02/03/13) for her brain metastasis. She is refusing any further treatment, despite her relatively treatable form of HER2 NEU +ve form of breast ca. She was seen in [name] by Palliative Care Services and Dr. Phillips Odor and [name] [name] [name] is opting for comfort care and future hospice care at this time. [name] [name] [name] recently desired to be DNR. She was given referral to [facility] Palliative Care Service through Hosp[facility] in [facility]. [name] [name] contacted [facility]. She was told that they can only provide personal hygiene service at this time. She feels that she does not need that service since she has a health aide at home. Currently she has a health aid who works 80 hours/month at [name] [name]'s home.  [name] [name] states that [facility] can extent PCS hours from 80 to 100 hours/mont if we can provide document support.   4. LUE DVT- On 02/12/13 (at discharge day), it was noticed that [name] [name] [name] developed DVT on [name] LUE. There is acute DVT noted in [name] left axillary and brachial veins and SVT noted in [name] left basilic veins by doppler. Due to her recent UGIB 2/2 PUD, anticoagulation was not an option on an outpatient basis. Fortunately, without any treatment, [name] [name] left upper extremity swelling improved significantly today. She does not have any pain.    Past Medical History  Diagnosis Date  . Cancer 1996    breast  . Hyperlipidemia   . Hypertension   . Asthma     best PEF=400  . 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 Chronic low back pain     MRI 2010 showed right sided disease with paracentral  disc protusion at L5-S1 which contacts and displaces [name] right S1 nerve root within [name] lateral recess  . Migraine headache     MRI 11/18/10: Mild progression of prominent white matter type changes which may be related to a small vessel disease and / or migraine headaches.Other causes for white matter type changes such as that secondaryto; vasculitis, inflammatory process or demyelinating process feltto be secondary considerations.  . Leg swelling   .  Neuralgia and neuritis   . Tenosynovitis     left, MRI 05/2003  . Herniated disc     h/o ruptured disk, laminectomy L4-L5, Dr. Montez Morita  . Breast cancer 1996, 2001    f/b Dr. Myrle Sheng. s/p masectomy +radation. w/ metastatic disease, now on Femara.  . Tobacco abuse   . Hx of radiation therapy 06/21/02 -07/13/02    T1-T6  . Bone metastases     breast primary, T spine  . Shortness of breath     exertion  . Hx of migraines   . Hx of radiation therapy 04/21/12    T3-T4 spinal SRS  . Breast cancer metastasized to multiple sites 07/12/2012  . Brain  cancer     new 4 cm brain met   Current Outpatient Prescriptions  Medication Sig Dispense Refill  . albuterol (PROVENTIL HFA;VENTOLIN HFA) 108 (90 BASE) MCG/ACT inhaler Inhale 2 puffs into [name] lungs every 6 (six) hours as needed. Shortness of breath  18 g  5  . ALPRAZolam (XANAX) 0.5 MG tablet Take 1 tablet (0.5 mg total) by mouth 3 (three) times daily as needed for anxiety or sleep.  90 tablet  1  . amoxicillin (AMOXIL) 500 MG tablet Take 1 tablet (500 mg total) by mouth 2 (two) times daily.  28 tablet  0  . clarithromycin (BIAXIN) 500 MG tablet Take 1 tablet (500 mg total) by mouth 2 (two) times daily.  28 tablet  0  . morphine (MSIR) 15 MG tablet Take 15-30 mg by mouth every 4 (four) hours as needed for pain.      [name] ondansetron (ZOFRAN) 8 MG tablet Take 1 tablet (8 mg total) by mouth every 8 (eight) hours as needed for nausea.  30 tablet  2  . pantoprazole (PROTONIX) 40 MG tablet Take 1 tablet (40 mg total) by mouth daily.  60 tablet  5  . promethazine (PHENERGAN) 12.5 MG tablet Take 2 tablets (25 mg total) by mouth every 12 (twelve) hours as needed for nausea.  30 tablet  3  . SUMAtriptan (IMITREX) 50 MG tablet Take 50 mg by mouth every 2 (two) hours as needed for migraine.       No current facility-administered medications for this visit.   Family History  Problem Relation Age of Onset  . Anesthesia problems Neg Hx   . Hypotension Neg Hx   . Malignant hyperthermia Neg Hx   . Pseudochol deficiency Neg Hx    History   Social History  . Marital Status: Divorced    Spouse Name: N/A    Number of Children: N/A  . Years of Education: N/A   Social History Main Topics  . Smoking status: Former Smoker -- 0.50 packs/day for 50 years    Types: Cigarettes    Quit date: 01/23/2013  . Smokeless tobacco: Never Used     Comment: quit smoking 01/23/13  . Alcohol Use: No  . Drug Use: Yes    Special: Oxycodone  . Sexual Activity: Not Currently   Other Topics Concern  . Not on file    Social History Narrative  . No narrative on file    Review of Systems: Full 14-point review of systems otherwise negative. See HPI.   Objective:  Physical Exam: Filed Vitals:   02/15/13 1315  BP: 137/83  Pulse: 95  Temp: 100.1 F (37.8 C)  TempSrc: Oral  Resp: 20  Height: 5\' 7"  (1.702 m)  Weight: 168 lb 3.2 oz (76.295 kg)  SpO2: 95%  Constitutional: No acute distress. She is more calm than I saw her in hospital. Head: Normocephalic and atraumatic.   Mouth/Throat: Oropharynx is clear and moist.  Neck: Normal range of motion. Neck supple.  Cardiovascular: Normal rate, regular rhythm, normal heart sounds and intact distal pulses.  Exam reveals no gallop and no friction rub.    No murmur heard. Extremeties: there is mild edema over left upper arm, but no any tenderness. Pulmonary/Chest: Effort normal and breath sounds normal. No respiratory distress. She has no wheezes. She has no rales. She exhibits no tenderness.  Port-a-cath in place. Left mastectomy.  Abdominal: Soft. Bowel sounds are normal. She exhibits no distension and no mass. There is tenderness (tender to palpation in epigastric region). There is no rebound and no guarding.  Musculoskeletal: Normal range of motion.  Neurological: She is alert and oriented to person, place, and time.  Skin: Skin is warm and dry. No rash noted. She is not diaphoretic. No erythema.   Assessment & Plan:

## 2013-02-15 NOTE — Assessment & Plan Note (Addendum)
Patient is currently amoxicillin and erythromycin for positive Helicobacter pylori and also taking Protonix 40 mg twice a day. Today her hemoglobin is stable (hemoglobin 9.7). There is no further signs of GI bleeding. She denies any bloody stool after she went home.   -will continue antibiotics and Protonix treatment.

## 2013-02-15 NOTE — Progress Notes (Signed)
Case discussed with Dr. Niu soon after the resident saw the patient.  We reviewed the resident's history and exam and pertinent patient test results.  I agree with the assessment, diagnosis, and plan of care documented in the resident's note. 

## 2013-02-15 NOTE — Assessment & Plan Note (Signed)
There is acute DVT noted in the left axillary and brachial veins. Due to recent GI bleeding and peptic ulcer disease, patient is not a candidate for anticoagulation treatment. Fortunately she recovers very well. Her left arm edema improved significantly. She does not have any tenderness today.  -will observe for now.

## 2013-02-15 NOTE — Assessment & Plan Note (Addendum)
Patient has severe nausea. 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 She is taking Zofran at home with the very little help. Her QTc on EKG was 458 on 01/03/13.  -will add Phenergan 50 mg bid for better control for her nausea -Patient was instructed to not take Phenergan and Zofran for the same time.

## 2013-02-15 NOTE — Telephone Encounter (Signed)
Called pt and left message regarding lab and MD on 8/27

## 2013-02-15 NOTE — Patient Instructions (Addendum)
1. Please take Phenergan 50 mg twice a day as needed for nausea. Please do not take Phenergan and Zofran at the same time.  2. We will help you to extend your [employer] working hours to 100h/month. 3. If you have worsening of your symptoms or new symptoms arise, please call the clinic ([phone]), or go to the ER immediately if symptoms are severe.

## 2013-02-16 ENCOUNTER — Telehealth: Payer: Self-pay | Admitting: *Deleted

## 2013-02-16 ENCOUNTER — Other Ambulatory Visit: Payer: Medicare HMO | Admitting: Lab

## 2013-02-16 ENCOUNTER — Telehealth: Payer: Self-pay | Admitting: Licensed Clinical Social Worker

## 2013-02-16 ENCOUNTER — Ambulatory Visit: Payer: Medicare HMO

## 2013-02-16 NOTE — Telephone Encounter (Signed)
Patient called and canceled appts for today

## 2013-02-16 NOTE — Telephone Encounter (Signed)
Received prior authorization from pt's pharmacy for her promethazine 12.5 mg tabs.  Submitted request earlier today.  Had call from pt's son Zollie Beckers [phone] that he will be heading out of town in the morning and wanted a resolution today.  I again contacted pt's insurance company and medication was approved until 06/19/2013.  Both pharmacy and pt's son informed.Kingsley Spittle Cass[place]8/20/20144:22 PM

## 2013-02-16 NOTE — Telephone Encounter (Signed)
Pt has chosen palliative route to manager her CA at this time.  Pt declines hospice care as she does not want to lose her [facility] hours.  Pt is currently getting 80 hrs of PCS care and in need of add'l.  CSW initiated [facility] request for change of status. CSW placed called to pt.  CSW left message informing pt request has been initiated. CSW provided contact hours and phone number.

## 2013-02-23 ENCOUNTER — Other Ambulatory Visit (HOSPITAL_BASED_OUTPATIENT_CLINIC_OR_DEPARTMENT_OTHER): Payer: Commercial Managed Care - HMO | Admitting: Lab

## 2013-02-23 ENCOUNTER — Ambulatory Visit (HOSPITAL_BASED_OUTPATIENT_CLINIC_OR_DEPARTMENT_OTHER): Payer: Commercial Managed Care - HMO | Admitting: Oncology

## 2013-02-23 ENCOUNTER — Telehealth: Payer: Self-pay | Admitting: Oncology

## 2013-02-23 VITALS — BP 148/75 | HR 73 | Temp 98.7°F | Resp 18 | Ht 67.0 in | Wt 168.2 lb

## 2013-02-23 DIAGNOSIS — C50919 Malignant neoplasm of unspecified site of unspecified female breast: Secondary | ICD-10-CM

## 2013-02-23 DIAGNOSIS — D630 Anemia in neoplastic disease: Secondary | ICD-10-CM

## 2013-02-23 DIAGNOSIS — D649 Anemia, unspecified: Secondary | ICD-10-CM

## 2013-02-23 DIAGNOSIS — C7931 Secondary malignant neoplasm of brain: Secondary | ICD-10-CM

## 2013-02-23 LAB — CBC WITH DIFFERENTIAL/PLATELET
Basophils Absolute: 0 10*3/uL (ref 0.0–0.1)
Eosinophils Absolute: 0.1 10*3/uL (ref 0.0–0.5)
HGB: 9.3 g/dL — ABNORMAL LOW (ref 11.6–15.9)
MONO#: 0.8 10*3/uL (ref 0.1–0.9)
NEUT#: 5 10*3/uL (ref 1.5–6.5)
Platelets: 455 10*3/uL — ABNORMAL HIGH (ref 145–400)
RBC: 3.22 10*6/uL — ABNORMAL LOW (ref 3.70–5.45)
RDW: 15.4 % — ABNORMAL HIGH (ref 11.2–14.5)
WBC: 7 10*3/uL (ref 3.9–10.3)

## 2013-02-23 LAB — COMPREHENSIVE METABOLIC PANEL (CC13)
ALT: 12 U/L (ref 0–55)
AST: 12 U/L (ref 5–34)
Alkaline Phosphatase: 65 U/L (ref 40–150)
BUN: 9.5 mg/dL (ref 7.0–26.0)
CO2: 27 mEq/L (ref 22–29)
Glucose: 94 mg/dl (ref 70–140)
Potassium: 3.6 mEq/L (ref 3.5–5.1)
Sodium: 139 mEq/L (ref 136–145)

## 2013-02-23 MED ORDER — MORPHINE SULFATE 15 MG PO TABS: 15.0000 mg | ORAL_TABLET | ORAL | Status: DC | PRN

## 2013-02-23 NOTE — Progress Notes (Signed)
[facility]    OFFICE PROGRESS NOTE   INTERVAL HISTORY:   She returns as scheduled. She was admitted on 02/09/2013 with nausea and severe anemia. She was found to have a gastric ulcer. She was transfused with packed red blood cells and placed on antacid therapy. She denies bleeding. The nausea has improved. No emesis. The back pain has improved.  She declined hospice care. She does not wish to receive further systemic therapy for breast cancer.  Objective:  Vital signs in last 24 hours:  Blood pressure 148/75, pulse 73, temperature 98.7 F (37.1 C), temperature source Oral, resp. rate 18, height 5\' 7"  (1.702 m), weight 168 lb 3.2 oz (76.295 kg), SpO2 99.00%.    HEENT: Neck without mass Lymphatics: No cervical, supra-clavicular, or axillary node Resp: Decreased breath sounds with and inspiratory rhonchi at the left base, no respiratory distress Cardio: Regular rate and rhythm GI: No hepatomegaly, nontender Vascular: No leg edema  Skin: Status post left mastectomy. 2 cm area of induration with superficial ulceration at the mid aspect of the left anterior chest wall   Portacath/PICC-without erythema  Lab Results:  Lab Results  Component Value Date   WBC 7.0 02/23/2013   HGB 9.3* 02/23/2013   HCT 27.6* 02/23/2013   MCV 85.7 02/23/2013   PLT 455* 02/23/2013   ANC 5.0    Medications: I have reviewed the patient's current medications.  Assessment/Plan: 1.Metastatic breast cancer - initially diagnosed with breast cancer in 1996 status post left mastectomy and axillary lymph node dissection. She developed a chest wall recurrence in 2001 and completed radiation. In April 2003, CT showed mediastinal, internal mammary, and right axillary adenopathy and two liver lesions. MRI of the thoracic spine June 18, 2002 revealed osseous abnormalities at T3 and T4 suspicious for metastatic disease with extension into the T3 pedicle. There was extra osseous extension  compressing and displacing the thoracic cord. She completed radiation from T1 through T6 June 21, 2002 through July 13, 2002. CT scan Nov 11, 2002 showed progression of metastatic disease with precardiac nodes, left pleural and lower lobe disease, subcutaneous nodules at the left anterior chest wall and progressive metastatic disease involving the liver. She began Femara in May/early June 2004 with subsequent resolution of chest wall nodules. CT scans January 14, 2005, of the chest and abdomen revealed findings of decreased pleural-based metastatic disease in the lower left hemithorax and no new or progressive disease in the chest. Small lesion in the right liver measuring 13.0 x 10.0 mm was reported as stable when compared to her previous study. No other liver lesions were seen. Bone scan January 22, 2006 showed degenerative changes. She discontinued Femara February 2009 and was lost a followup. She presented in March 2010 with an ulcerated chest wall lesion and an elevated CA27.29. Restaging CTs September 18, 2008 showed a 1.3 x 2.2 cm subdermal lesion at the left anterior chest, multiple enlarged metastases in the left hemithorax at the mediastinum and pleura felt to represent necrotic lymph nodes versus pleural metastases. Right axillary lymph node was decreased in size compared to 2006. There were no suspicious bone lesions. A previously noted lesion in the inferior right liver was not clearly seen. She resumed Femara March 2010. On October 23, 2008 the chest wall lesion had improved. The CA27.29 on October 23, 2008 was stable at 60. She discontinued Femara in June 2010 and CTs of the abdomen and pelvis April 06, 2009 showed slight decrease in the size of a  pleural-based nodule in the left lower lobe. There was no other evidence for metastatic disease in the abdomen or pelvis. She resumed Femara on April 11, 2009. CA27.29 returned at 87 on April 11, 2009. CT of the abdomen and pelvis on May 30, 2009 showed an  increase in multiple pericardial masses, increased left chest wall subcutaneous nodule, stable pleural-based metastases and stable liver lesions. She discontinued Femara in November 2010 due to nausea and vomiting. Femara was resumed following an office visit June 06, 2009. Dr. Myrle Sheng reviewed a CT from May 30, 2009 with a [name] radiologist comparing the scan to scans from March 2010 and October 2010. In addition, a CT scan from 2006 was reviewed. The pleural-based lesions in the left chest had not changed significantly since March 2010. The pericardial masses appeared slightly larger. Both the pericardial masses and pleural-based masses were larger compared to the CT from 2006. She discontinued Femara January 2011. The left chest wall nodule recurred during the time she was off of Femara. Femara was resumed following an office visit 10/02/2009. The nodule again improved. Restaging CT of the chest 08/16/2010 was stable compared to a CT from 03/29/2010. Most recent CA 27-29 on 01/16/2012 was stable at 340. Femara was continued. CT the chest 02/23/2012 with progressive pleural-based tumor in the left chest with probable involvement of T3. Femara was discontinued. MRI of the thoracic spine on 03/05/2012 showed severe T3 and T4 metastatic disease with mild pathologic compression fractures and epidural extension causing some cord compression at T3. There was no definite cord edema. There was associated extensive bilateral neural foraminal involvement at the T2 and T3 nerve levels greater on the left. There was associated very bulky mediastinal and pleural metastatic disease. She underwent a laminectomy procedure on 04/01/2012 with resection of epidural metastatic tumor. 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 The tumor was ER positive 100%; PR positive 53%; HER-2/neu by CISH showed amplification. She declined a trial of chemotherapy and Herceptin. She began Faslodex on  07/12/2012. The CA 27-29 tumor marker was further increased on 10/14/2012. MRI thoracic spine 12/26/2012 showed progressive metastatic disease to the spine with increased pathologic fractures at T3 and T4. Despite the previous T3 laminectomies there was early cord compression at T3 and right-sided neural foraminal extension of epidural tumor at T2-T3. Bulky extra osseous metastatic disease was again demonstrated within the mediastinum and left pleural space. She began treatment with Taxol/Herceptin/Pertuzumab with the Taxol given on a day 1/day 8 schedule and the Herceptin/Pertuzumab given day 1 only of a 21 day cycle on 12/29/2012.  2. Ulcerated left chest lesion, likely a metastatic lesion.  3. Severe right low back/buttock pain radiating to the left leg October 2010 with MRI on April 06, 2009 revealing a disk protrusion at L5-S1 displacing the right S1 nerve root. She was evaluated by Dr. Jordan Likes. The pain has resolved.  4. Hospitalization December 1 through May 31, 2009 with nausea, vomiting and abdominal pain - resolved.  5. 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  6. Pain at the right back/ iliac region October 25, 2008.  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  8. Status post left knee replacement.  9. Chronic edema of the left lower leg.  10. "Migraines". She takes Imitrex as needed.  11. Falls and balance problems occurring over the past year. She has been referred to neurology. No difficulty with ambulation today.  12. Right breast  with area of firmness on exam March 2013 -negative right breast mammogram and ultrasound 09/15/2011 . Negative right breast mammogram 09/16/2012.  13. Pain at upper back. Resolved following the laminectomy procedure. The pain increased. CT and MRI confirmed progressive disease.improved  14. Unsteady gait on 04/14/2012-much improved.  15. Nausea/vomiting following Faslodex 07/26/2012.    16. Weight loss-likely secondary to progression of the metastatic breast cancer.  17. Persistent nausea and diarrhea following initiation of Taxol/Herceptin/pertuzumab 12/29/2012. 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 Admission with severe anemia and a gastric ulcer 02/09/2013, biopsy positive for H. pylori 19. Left upper extremity DVT documented during the 02/09/2013 hospital admission-not treated   Disposition:  She appears stable. She again indicated that she does not wish to receive treatment for breast cancer. The plan is to continue a supportive care approach. The hemoglobin is stable today. Ms. Buckel will return for an office visit, CBC, and Port-A-Cath flush in 4 weeks.   Thornton Papas, MD  02/23/2013  3:29 PM

## 2013-02-23 NOTE — Telephone Encounter (Signed)
Gave pt appt for September lab, ML and flush

## 2013-02-23 NOTE — Progress Notes (Signed)
Difficult to review her medication list. Does not recall what meds she is taking. Only brought bottles for #2 antibiotics and the MSIR and requesting refill on MSIR. Called [facility] pharmacy and confirmed that she did pick up the Xanax script on 8/19 and the promethazine on 8/20, but patient is not able to recall this.

## 2013-02-26 ENCOUNTER — Encounter (HOSPITAL_COMMUNITY): Payer: Self-pay | Admitting: Emergency Medicine

## 2013-02-26 ENCOUNTER — Observation Stay (HOSPITAL_COMMUNITY)
Admission: EM | Admit: 2013-02-26 | Discharge: 2013-02-28 | Disposition: A | Discharge status: Home / Self Care | Payer: Medicare HMO | Attending: Internal Medicine | Admitting: Internal Medicine

## 2013-02-26 ENCOUNTER — Emergency Department (HOSPITAL_COMMUNITY): Payer: Medicare HMO

## 2013-02-26 ENCOUNTER — Other Ambulatory Visit: Payer: Self-pay

## 2013-02-26 DIAGNOSIS — C7900 Secondary malignant neoplasm of unspecified kidney and renal pelvis: Secondary | ICD-10-CM | POA: Insufficient documentation

## 2013-02-26 DIAGNOSIS — C7951 Secondary malignant neoplasm of bone: Secondary | ICD-10-CM | POA: Insufficient documentation

## 2013-02-26 DIAGNOSIS — R110 Nausea: Secondary | ICD-10-CM | POA: Diagnosis present

## 2013-02-26 DIAGNOSIS — K259 Gastric ulcer, unspecified as acute or chronic, without hemorrhage or perforation: Secondary | ICD-10-CM | POA: Insufficient documentation

## 2013-02-26 DIAGNOSIS — R109 Unspecified abdominal pain: Secondary | ICD-10-CM

## 2013-02-26 DIAGNOSIS — R627 Adult failure to thrive: Secondary | ICD-10-CM | POA: Insufficient documentation

## 2013-02-26 DIAGNOSIS — G43909 Migraine, unspecified, not intractable, without status migrainosus: Secondary | ICD-10-CM | POA: Insufficient documentation

## 2013-02-26 DIAGNOSIS — I2699 Other pulmonary embolism without acute cor pulmonale: Secondary | ICD-10-CM

## 2013-02-26 DIAGNOSIS — I10 Essential (primary) hypertension: Secondary | ICD-10-CM | POA: Diagnosis present

## 2013-02-26 DIAGNOSIS — M545 Low back pain, unspecified: Secondary | ICD-10-CM | POA: Insufficient documentation

## 2013-02-26 DIAGNOSIS — Z853 Personal history of malignant neoplasm of breast: Secondary | ICD-10-CM | POA: Insufficient documentation

## 2013-02-26 DIAGNOSIS — C7931 Secondary malignant neoplasm of brain: Secondary | ICD-10-CM | POA: Insufficient documentation

## 2013-02-26 DIAGNOSIS — J45909 Unspecified asthma, uncomplicated: Secondary | ICD-10-CM | POA: Diagnosis present

## 2013-02-26 DIAGNOSIS — Z923 Personal history of irradiation: Secondary | ICD-10-CM | POA: Insufficient documentation

## 2013-02-26 DIAGNOSIS — C7800 Secondary malignant neoplasm of unspecified lung: Secondary | ICD-10-CM | POA: Insufficient documentation

## 2013-02-26 DIAGNOSIS — E785 Hyperlipidemia, unspecified: Secondary | ICD-10-CM | POA: Insufficient documentation

## 2013-02-26 DIAGNOSIS — C799 Secondary malignant neoplasm of unspecified site: Secondary | ICD-10-CM

## 2013-02-26 DIAGNOSIS — R1013 Epigastric pain: Principal | ICD-10-CM | POA: Diagnosis present

## 2013-02-26 DIAGNOSIS — F411 Generalized anxiety disorder: Secondary | ICD-10-CM | POA: Insufficient documentation

## 2013-02-26 DIAGNOSIS — E43 Unspecified severe protein-calorie malnutrition: Secondary | ICD-10-CM | POA: Diagnosis present

## 2013-02-26 DIAGNOSIS — C50919 Malignant neoplasm of unspecified site of unspecified female breast: Secondary | ICD-10-CM | POA: Diagnosis present

## 2013-02-26 DIAGNOSIS — K5900 Constipation, unspecified: Secondary | ICD-10-CM | POA: Diagnosis present

## 2013-02-26 DIAGNOSIS — F17200 Nicotine dependence, unspecified, uncomplicated: Secondary | ICD-10-CM | POA: Insufficient documentation

## 2013-02-26 DIAGNOSIS — G8929 Other chronic pain: Secondary | ICD-10-CM | POA: Insufficient documentation

## 2013-02-26 DIAGNOSIS — Z66 Do not resuscitate: Secondary | ICD-10-CM | POA: Insufficient documentation

## 2013-02-26 DIAGNOSIS — Z87891 Personal history of nicotine dependence: Secondary | ICD-10-CM | POA: Insufficient documentation

## 2013-02-26 LAB — URINALYSIS, ROUTINE W REFLEX MICROSCOPIC
Bilirubin Urine: NEGATIVE
Hgb urine dipstick: NEGATIVE
Ketones, ur: 15 mg/dL — AB
Nitrite: NEGATIVE
Specific Gravity, Urine: 1.015 (ref 1.005–1.030)
pH: 7 (ref 5.0–8.0)

## 2013-02-26 LAB — COMPREHENSIVE METABOLIC PANEL
ALT: 8 U/L (ref 0–35)
AST: 13 U/L (ref 0–37)
Alkaline Phosphatase: 56 U/L (ref 39–117)
CO2: 24 mEq/L (ref 19–32)
Calcium: 7.9 mg/dL — ABNORMAL LOW (ref 8.4–10.5)
GFR calc Af Amer: >90 mL/min (ref >90)
Glucose, Bld: 90 mg/dL (ref 70–99)
Potassium: 3 mEq/L — ABNORMAL LOW (ref 3.5–5.1)
Sodium: 138 mEq/L (ref 135–145)
Total Protein: 5.6 g/dL — ABNORMAL LOW (ref 6.0–8.3)

## 2013-02-26 LAB — CBC WITH DIFFERENTIAL/PLATELET
Basophils Relative: 0 % (ref 0–1)
Eosinophils Absolute: 0 10*3/uL (ref 0.0–0.7)
HCT: 25.8 % — ABNORMAL LOW (ref 36.0–46.0)
Hemoglobin: 8.4 g/dL — ABNORMAL LOW (ref 12.0–15.0)
Lymphs Abs: 1.2 10*3/uL (ref 0.7–4.0)
MCH: 28.2 pg (ref 26.0–34.0)
MCHC: 32.6 g/dL (ref 30.0–36.0)
Monocytes Absolute: 0.6 10*3/uL (ref 0.1–1.0)
Monocytes Relative: 9 % (ref 3–12)
RBC: 2.98 MIL/uL — ABNORMAL LOW (ref 3.87–5.11)

## 2013-02-26 LAB — TROPONIN I: Troponin I: <0.3 ng/mL (ref <0.30)

## 2013-02-26 LAB — TYPE AND SCREEN: ABO/RH(D): O POS

## 2013-02-26 LAB — URINE MICROSCOPIC-ADD ON

## 2013-02-26 MED ORDER — POTASSIUM CHLORIDE CRYS ER 20 MEQ PO TBCR
40.0000 meq | EXTENDED_RELEASE_TABLET | Freq: Once | ORAL | Status: AC
  Administered 2013-02-27: 01:00:00 40 meq via ORAL
  Filled 2013-02-26: qty 2

## 2013-02-26 MED ORDER — IOHEXOL 300 MG/ML  SOLN: 50.0000 mL | Freq: Once | INTRAMUSCULAR | Status: DC | PRN

## 2013-02-26 MED ORDER — MORPHINE SULFATE 2 MG/ML IJ SOLN: 1.0000 mg | INTRAMUSCULAR | Status: DC | PRN

## 2013-02-26 MED ORDER — IOHEXOL 350 MG/ML SOLN
100.0000 mL | Freq: Once | INTRAVENOUS | Status: AC | PRN
  Administered 2013-02-26: 100 mL via INTRAVENOUS

## 2013-02-26 MED ORDER — ONDANSETRON HCL 4 MG/2ML IJ SOLN: 4.0000 mg | Freq: Four times a day (QID) | INTRAMUSCULAR | Status: DC | PRN

## 2013-02-26 MED ORDER — ALBUTEROL SULFATE HFA 108 (90 BASE) MCG/ACT IN AERS
2.0000 | INHALATION_SPRAY | Freq: Four times a day (QID) | RESPIRATORY_TRACT | Status: DC | PRN
  Filled 2013-02-26: qty 6.7

## 2013-02-26 MED ORDER — IOHEXOL 300 MG/ML  SOLN
25.0000 mL | INTRAMUSCULAR | Status: DC | PRN
  Administered 2013-02-26: 25 mL via ORAL

## 2013-02-26 MED ORDER — BISACODYL 10 MG RE SUPP: 10.0000 mg | Freq: Every day | RECTAL | Status: DC | PRN

## 2013-02-26 MED ORDER — SENNA 8.6 MG PO TABS
1.0000 | ORAL_TABLET | Freq: Two times a day (BID) | ORAL | Status: DC
  Administered 2013-02-27 – 2013-02-28 (×4): 8.6 mg via ORAL
  Filled 2013-02-26 (×5): qty 1

## 2013-02-26 MED ORDER — SODIUM CHLORIDE 0.9 % IV SOLN: 250.0000 mL | INTRAVENOUS | Status: DC | PRN

## 2013-02-26 MED ORDER — PANTOPRAZOLE SODIUM 20 MG PO TBEC
20.0000 mg | DELAYED_RELEASE_TABLET | Freq: Two times a day (BID) | ORAL | Status: DC
  Administered 2013-02-27 – 2013-02-28 (×4): 20 mg via ORAL
  Filled 2013-02-26 (×6): qty 1

## 2013-02-26 MED ORDER — SODIUM CHLORIDE 0.9 % IV BOLUS (SEPSIS)
1000.0000 mL | Freq: Once | INTRAVENOUS | Status: AC
  Administered 2013-02-26: 1000 mL via INTRAVENOUS

## 2013-02-26 MED ORDER — ONDANSETRON HCL 4 MG/2ML IJ SOLN
4.0000 mg | Freq: Once | INTRAMUSCULAR | Status: AC
  Administered 2013-02-26: 4 mg via INTRAVENOUS
  Filled 2013-02-26: qty 2

## 2013-02-26 MED ORDER — SODIUM CHLORIDE 0.9 % IJ SOLN
3.0000 mL | Freq: Two times a day (BID) | INTRAMUSCULAR | Status: DC
  Administered 2013-02-27 – 2013-02-28 (×4): 3 mL via INTRAVENOUS

## 2013-02-26 MED ORDER — SODIUM CHLORIDE 0.9 % IJ SOLN
3.0000 mL | Freq: Two times a day (BID) | INTRAMUSCULAR | Status: DC
  Administered 2013-02-27 – 2013-02-28 (×4): 3 mL via INTRAVENOUS

## 2013-02-26 MED ORDER — TRAZODONE 25 MG HALF TABLET
25.0000 mg | ORAL_TABLET | Freq: Every evening | ORAL | Status: DC | PRN
  Filled 2013-02-26: qty 1

## 2013-02-26 MED ORDER — OXYCODONE HCL 5 MG PO TABS: 5.0000 mg | ORAL_TABLET | ORAL | Status: DC | PRN

## 2013-02-26 MED ORDER — ONDANSETRON HCL 4 MG PO TABS: 4.0000 mg | ORAL_TABLET | Freq: Four times a day (QID) | ORAL | Status: DC | PRN

## 2013-02-26 MED ORDER — POTASSIUM CHLORIDE 10 MEQ/100ML IV SOLN
10.0000 meq | INTRAVENOUS | Status: AC
  Administered 2013-02-27 (×4): 10 meq via INTRAVENOUS
  Filled 2013-02-26 (×4): qty 100

## 2013-02-26 MED ORDER — MORPHINE SULFATE 4 MG/ML IJ SOLN
4.0000 mg | Freq: Once | INTRAMUSCULAR | Status: AC
  Administered 2013-02-26: 4 mg via INTRAVENOUS
  Filled 2013-02-26: qty 1

## 2013-02-26 MED ORDER — SODIUM CHLORIDE 0.9 % IJ SOLN: 3.0000 mL | INTRAMUSCULAR | Status: DC | PRN

## 2013-02-26 MED ORDER — PANTOPRAZOLE SODIUM 40 MG IV SOLR
40.0000 mg | Freq: Once | INTRAVENOUS | Status: AC
  Administered 2013-02-26: 40 mg via INTRAVENOUS
  Filled 2013-02-26: qty 40

## 2013-02-26 MED ORDER — DOCUSATE SODIUM 100 MG PO CAPS
100.0000 mg | ORAL_CAPSULE | Freq: Two times a day (BID) | ORAL | Status: DC
  Administered 2013-02-27 – 2013-02-28 (×4): 100 mg via ORAL
  Filled 2013-02-26 (×5): qty 1

## 2013-02-26 MED ORDER — ALPRAZOLAM 0.25 MG PO TABS
0.2500 mg | ORAL_TABLET | Freq: Once | ORAL | Status: AC
  Administered 2013-02-27: 01:00:00 0.25 mg via ORAL
  Filled 2013-02-26: qty 1

## 2013-02-26 NOTE — ED Provider Notes (Signed)
8:11 PM Patient signed out to Dr. Manus Gunning.  Patient awaiting CT ab/pelvis and also CT angio.  Plan is for patient to be admitted to Outpatient Clinic once the CT scans have resulted.  Patient with a history of metastatic breast cancer presents today with upper abdominal pain and constipation.  She is currently not undergoing treatment for her breast cancer.  10:05 Pm CT abdomen results as follows: IMPRESSION:  No acute intra-abdominal findings  CT angio chest: 1. Positive for segmental sized pulmonary embolism to the right  lower lobe, nonobstructive. This area is obscured by respiratory  motion on chest CTA, but the embolism is clearly seen on abdominal  imaging reformats.  10:15 PM Discussed with Internal Medicine Teaching Service who has agreed to admit the patient for additional management.    Pascal Lux [place], PA-C 02/26/13 [id number]

## 2013-02-26 NOTE — ED Notes (Signed)
Pt told she has PE, is extremely tearful.  Made call to friend for her.

## 2013-02-26 NOTE — H&P (Signed)
Date: 02/26/2013               Patient Name:  [name] [name] MRN: [id number]  DOB: 1945-07-04 Age / Sex: 67 y.o., female   PCP: Lorretta Harp, MD         Medical Service: Internal Medicine Teaching Service         Attending Physician: Dr. Burns Spain, MD    First Contact: Dr. Thomasenia Bottoms Pager: [phone]  Second Contact: Dr. Sherrine Maples Pager: [phone]       After Hours (After 5p/  First Contact Pager: [phone]  weekends / holidays): Second Contact Pager: [phone]   Chief Complaint: abdominal pain   History of Present Illness:  Ms. Kershaw is a 67 year old woman with history of metastatic breast cancer (no longer undergoing treatment), recent UGIB, recent LUE DVT (not on anticoagulation), HTN admitted for abdominal pain and nonobstructive RLL segmental PE seen on CT abdomen imaging reformats (not seen on CTA).  Pt was recently admitted on 02/09/13 for UGIB (Hb 5.1 on presentation, required 2 units PRBCs), EGD on 02/11/13 showed large 3 cm gastric ulcer, biopsy positive for H. Pylori, now s/p triple therapy.  During that admission, she was also found to have LUE axillary DVT but was discharged home without anticoagulation given UGIB and goals of care per palliative care.    Patient states that she began having epigastric abdominal pain a couple of days ago, associated with nausea but no vomiting.  She also has not had a bowel movement in 5 days though she has been passing gas.  Denies fever/chills, diarrhea, hematochezia, melena, chest pain, SOB hemoptysis.  States that she is no longer feeling nauseated after ondansetron in the ED, is quite hungry and would like to eat.    Patient has metastatic breast cancer diagnosed in 1996, ER positive 100%; PR positive 53%; HER-2/neu positive by [facility]. She is status post left mastectomy and axillary node dissection, but did not complete chemotherapy course and had reoccurrence of her cancer in 2010. 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 She began  chemotherapy treatment with Taxol/Herceptin/Pertuzumab on 12/29/12 but stopped about one month later due to persistent nausea. She is followed by Dr. Truett Perna, and per his note, her nausea was thought to be at least partially due to increased ICP from brain mets. She was also previosuly receiving palliative radiation therapy to the head (last 02/03/13) but she is now refusing all further treatment. She met with by palliative care, Dr. Phillips Odor, at her last admission and was planning for hospice care.  On 8/18, patient's LUE was swollen, and UE Doppler showed acute DVT in the left axillary and brachial veins. In setting of UGIB, outpatient anticoagulation was not an option, but patient strongly desired to go home.  Given the patient's new goals of care and end of life goals, decision was made to discharge to home without anticoagulation.  Patient still does not desire anti-coagulation and reiterated her DNR/DNI status.    In the ED, CTA negative for PE but segmental RLL PE seen on CT abdomen (no acute intra-abdominal findings).  Troponin negative, lipase within normal limits.  Of note, her hemoglobin is 8.4 today (down from 9.3 on 9/27), K 3.0.  Meds: Current Facility-Administered Medications  Medication Dose Route Frequency Provider Last Rate Last Dose  . 0.9 %  sodium chloride infusion  250 mL Intravenous PRN Ky Barban, MD      . albuterol (PROVENTIL HFA;VENTOLIN HFA) 108 (90 BASE)  MCG/ACT inhaler 2 puff  2 puff Inhalation Q6H PRN Ky Barban, MD      . ALPRAZolam Prudy Feeler) tablet 0.25 mg  0.25 mg Oral Once Ky Barban, MD      . bisacodyl (DULCOLAX) suppository 10 mg  10 mg Rectal Daily PRN Ky Barban, MD      . docusate sodium (COLACE) capsule 100 mg  100 mg Oral BID Ky Barban, MD      . morphine 2 MG/ML injection 1 mg  1 mg Intravenous Q4H PRN Ky Barban, MD      . ondansetron (ZOFRAN) tablet 4 mg  4 mg Oral Q6H PRN Ky Barban, MD        Or  . ondansetron (ZOFRAN) injection 4 mg  4 mg Intravenous Q6H PRN Ky Barban, MD      . oxyCODONE (Oxy IR/ROXICODONE) immediate release tablet 5 mg  5 mg Oral Q4H PRN Ky Barban, MD      . senna (SENOKOT) tablet 8.6 mg  1 tablet Oral BID Ky Barban, MD      . sodium chloride 0.9 % injection 3 mL  3 mL Intravenous Q12H Ky Barban, MD      . sodium chloride 0.9 % injection 3 mL  3 mL Intravenous Q12H Ky Barban, MD      . sodium chloride 0.9 % injection 3 mL  3 mL Intravenous PRN Ky Barban, MD      . traZODone (DESYREL) tablet 25 mg  25 mg Oral QHS PRN Ky Barban, MD        Allergies: Allergies as of 02/26/2013 - Review Complete 02/26/2013  Allergen Reaction Noted  . Banana  12/31/2011   Past Medical History  Diagnosis Date  . Cancer 1996    breast  . Hyperlipidemia   . Hypertension   . Asthma     best PEF=400  . 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 Chronic low back pain     MRI 2010 showed right sided disease with paracentral  disc protusion at L5-S1 which contacts and displaces the right S1 nerve root within the lateral recess  . Migraine headache     MRI 11/18/10: Mild progression of prominent white matter type changes which may be related to a small vessel disease and / or migraine headaches.Other causes for white matter type changes such as that secondaryto; vasculitis, inflammatory process or demyelinating process feltto be secondary considerations.  . Leg swelling   . Neuralgia and neuritis   . Tenosynovitis     left, MRI 05/2003  . Herniated disc     h/o ruptured disk, laminectomy L4-L5, Dr. Montez Morita  . Breast cancer 1996, 2001    f/b Dr. Myrle Sheng. s/p masectomy +radation. w/ metastatic disease, now on Femara.  . Tobacco abuse   . Hx of radiation therapy 06/21/02 -07/13/02    T1-T6  . Bone metastases     breast primary,  T spine  . Shortness of breath     exertion  . Hx of migraines   . Hx of radiation therapy 04/21/12    T3-T4 spinal SRS  . Breast cancer metastasized to multiple sites 07/12/2012  . Brain cancer     new 4 cm brain met   Past Surgical History  Procedure Laterality Date  .  Abdominal hysterectomy    . Cesarean section    . Mastectomy      left  . Laminectomy      L4-L5  . Joint replacement      Left Knee  . Back surgery    . Breast surgery    . Amputation  11/26/2011    Procedure: AMPUTATION DIGIT;  Surgeon: Kennieth Rad, MD;  Location: [facility] OR;  Service: Orthopedics;  Laterality: Bilateral;  PHALANGECTOMY  - Right fifth toe and left third toe  . Rotator cuff repair      pt does not remember year of surgery  . Thoracic laminectomy  04/01/12    T 2-4-metastatic tumor resection  . Esophagogastroduodenoscopy N/A 02/11/2013    Procedure: ESOPHAGOGASTRODUODENOSCOPY (EGD);  Surgeon: Graylin Shiver, MD;  Location: [facility] ENDOSCOPY;  Service: Endoscopy;  Laterality: N/A;  . Colonoscopy N/A 02/11/2013    Procedure: COLONOSCOPY;  Surgeon: Graylin Shiver, MD;  Location: [facility] ENDOSCOPY;  Service: Endoscopy;  Laterality: N/A;   Family History  Problem Relation Age of Onset  . Anesthesia problems Neg Hx   . Hypotension Neg Hx   . Malignant hyperthermia Neg Hx   . Pseudochol deficiency Neg Hx    History   Social History  . Marital Status: Divorced    Spouse Name: N/A    Number of Children: N/A  . Years of Education: N/A   Occupational History  . Not on file.   Social History Main Topics  . Smoking status: Former Smoker -- 0.50 packs/day for 50 years    Types: Cigarettes    Quit date: 01/23/2013  . Smokeless tobacco: Never Used     Comment: quit smoking 01/23/13  . Alcohol Use: No  . Drug Use: Yes    Special: Oxycodone  . Sexual Activity: Not Currently   Other Topics Concern  . Not on file   Social History Narrative  . No narrative on file    Review of Systems: General:  decreased appetite x 2 days 2/2 nausea, no fevers, chills, changes in weight Skin: no rash HEENT: no blurry vision, hearing changes, sore throat Pulm: no dyspnea, coughing, wheezing CV: no chest pain, palpitations, shortness of breath Abd: epigastric/diffuse abdominal pain, nausea, constipation, no vomiting, diarrhea GU: no dysuria, hematuria, polyuria Ext: no arthralgias, myalgias Neuro: no weakness, numbness, or tingling  Physical Exam: Blood pressure 178/86, pulse 103, temperature 99.4 F (37.4 C), temperature source Oral, resp. rate 19, SpO2 97.00%.  General: alert, cooperative, pt crying throughout interview saying she "just wants to go home" HEENT: vision grossly intact, oropharynx clear and non-erythematous  Neck: supple, no lymphadenopathy Chest: s/p left mastectomy   Lungs: clear to ascultation bilaterally, normal work of respiration, no wheezes, rales, ronchi Heart: tachycardic to 90s, regular rate and rhythm, no murmurs, gallops, or rubs Abdomen: tenderness to palpation in epigastric area, soft, non-distended, normal bowel sounds Extremities: 1+ pitting edema to bilateral mid-shins; no cyanosis or clubbing Neurologic: alert & oriented X3, cranial nerves II-XII intact, strength grossly intact, sensation intact to light touch   Lab results: Basic Metabolic Panel:  Recent Labs  40/98/11 1706  NA 138  K 3.0*  CL 104  CO2 24  GLUCOSE 90  BUN 8  CREATININE 0.69  CALCIUM 7.9*   Liver Function Tests:  Recent Labs  02/26/13 1706  AST 13  ALT 8  ALKPHOS 56  BILITOT 0.3  PROT 5.6*  ALBUMIN 2.2*    Recent Labs  02/26/13 1706  LIPASE  11   CBC:  Recent Labs  02/26/13 1706  WBC 6.2  NEUTROABS 4.4  HGB 8.4*  HCT 25.8*  MCV 86.6  PLT 353   Cardiac Enzymes:  Recent Labs  02/26/13 1924  TROPONINI <0.30   Coagulation:  Recent Labs  02/26/13 1706  LABPROT 14.5  INR 1.15   Urinalysis:  Recent Labs  02/26/13 1939  COLORURINE YELLOW    LABSPEC 1.015  PHURINE 7.0  GLUCOSEU NEGATIVE  HGBUR NEGATIVE  BILIRUBINUR NEGATIVE  KETONESUR 15*  PROTEINUR NEGATIVE  UROBILINOGEN 1.0  NITRITE NEGATIVE  LEUKOCYTESUR SMALL*    Imaging results:  Ct Angio Chest Pe W/cm &/or Wo Cm  02/26/2013   **ADDENDUM** CREATED: 02/26/2013 22:06:41  1.  Positive for segmental sized pulmonary embolism to the right lower lobe, nonobstructive. This area is obscured by respiratory motion on chest CTA, but the embolism is clearly seen on abdominal imaging reformats. 2.  Addendum discussed with PA Heather via telephone at 22:05 on 02/26/2013.  **END ADDENDUM** SIGNED BY: Audry Riles  02/26/2013   *RADIOLOGY REPORT*  Clinical Data:  Upper abdominal pain.  Nausea and constipation. History of breast cancer, no longer under treatment.  CT ANGIOGRAPHY CHEST CT ABDOMEN AND PELVIS WITH CONTRAST  Technique:  Multidetector CT imaging of the chest was performed using the standard protocol during bolus administration of intravenous contrast.  Multiplanar CT image reconstructions including MIPs were obtained to evaluate the vascular anatomy. Multidetector CT imaging of the abdomen and pelvis was performed using the standard protocol during bolus administration of intravenous contrast.  Contrast: OMNIPAQUE IOHEXOL 350 MG/ML SOLN  Comparison:  Chest CT  01/19/2012.  Abdomen pelvis CT 10/09/2009.  CTA CHEST  Findings:  THORACIC INLET/BODY WALL:  Unremarkable appearance of right IJ portacatheter.  Status post left mastectomy with axillary dissection.  Left chest wall subcutaneous nodule enlarged from prior, currently 2.8 x 1.6 cm.  Abnormally lobulated lymph nodes present in the right axilla, measuring up to 2 cm.  These are mildly increased from 2013.  MEDIASTINUM:  Bulky pleural metastatic disease in the upper left chest has progressed since prior, with mass until measuring up to 7 x 7 x 10 cm.  There is extensive contact with the left subclavian artery and the lateral  aortic arch. There is effacement of the left brachycephalic vein.The left IMA is partly encased.  Nondominant left vertebral artery is stenotic near its origin, likely secondary to atherosclerosis, unchanged from prior.  No pulmonary embolism detected.  No cardiomegaly or pericardial effusion.  Coronary artery atherosclerosis.  LUNG WINDOWS:  No consolidation.  Minimal patchy opacity in the right upper lobe, nonspecific.  Progressive pleural metastatic disease in the left chest, with bulky disease in the upper pleural space, as described above.  The costophrenic sulci contained extensive tumor, with erosion into the bony thorax, particularly the ninth rib.  Extensive diaphragmatic involvement, likely with ingrowth, now affecting the spleen.  Left diaphragmatic elevation, likely related to phrenic nerve involvement by tumor.  OSSEOUS:  Direct invasion of the posterior left 9th rib, as above.  As recently demonstrated by MRI, there are pathologic compression fractures of T3 and T4, fixated with posterior rod and screw fixation spanning from T1-T6.  Mild lucencies around the T6 pedicle screws are not changed from prior.  Additional smaller metastatic deposits as seen on previous MRI, including within the spinous process of T8.  Status post T3 laminectomies.   Review of the MIP images confirms the above findings.  IMPRESSION:  1.  No pulmonary embolism detected. 2.  Progressive thoracic (osseous, left pleural, and left chest wall) metastatic disease compared to 01/19/2012.  3. Elevate, likely paretic left diaphragm.  CT ABDOMEN AND PELVIS  Liver: Ill-defined hypoenhancing lesion in low segment six, measuring 1 centimeter, unchanged from 2011.  Biliary: No evidence of biliary obstruction or stone.  Pancreas: Unremarkable.  Spleen: Scalloping of the upper and posterior surface of the spleen related to tumor growth through the diaphragm.  Adrenals: Unremarkable.  Kidneys and ureters: Numerous hypoenhancing lesions  throughout the bilateral kidneys, of varying density.  The most suspicious is in the upper pole left kidney, 15 mm in diameter.  Renal lesions are unlikely to be significant given the extensive metastatic disease.  Bladder: Unremarkable.  Bowel: No obstruction. No pericecal inflammatory changes.  Retroperitoneum: Nodules at the aortic hiatus, compatible with nodal metastatic spread, measuring up to 15 mm on the right.  There borderline enlarged periaortic lymph nodes - not definitely malignant.  Peritoneum: No free fluid or gas.  Reproductive: Hysterectomy.  Vascular: Extensive atherosclerotic calcification of the aortic branch vessels.  OSSEOUS: Sclerotic lesion in the right L2 body, left L3 body, S1 body, and right sacral ala are consistent with metastatic deposits. No pathologic fracture. No suspicious lytic or blastic lesions.  Review of the MIP images confirms the above findings.  IMPRESSION:   No acute intra-abdominal findings.   Original Report Authenticated By: Tiburcio Pea   Ct Abdomen Pelvis W Contrast  02/26/2013   **ADDENDUM** CREATED: 02/26/2013 22:06:41  1.  Positive for segmental sized pulmonary embolism to the right lower lobe, nonobstructive. This area is obscured by respiratory motion on chest CTA, but the embolism is clearly seen on abdominal imaging reformats. 2.  Addendum discussed with PA Heather via telephone at 22:05 on 02/26/2013.  **END ADDENDUM** SIGNED BY: Audry Riles  02/26/2013   *RADIOLOGY REPORT*  Clinical Data:  Upper abdominal pain.  Nausea and constipation. History of breast cancer, no longer under treatment.  CT ANGIOGRAPHY CHEST CT ABDOMEN AND PELVIS WITH CONTRAST  Technique:  Multidetector CT imaging of the chest was performed using the standard protocol during bolus administration of intravenous contrast.  Multiplanar CT image reconstructions including MIPs were obtained to evaluate the vascular anatomy. Multidetector CT imaging of the abdomen and pelvis was performed  using the standard protocol during bolus administration of intravenous contrast.  Contrast: OMNIPAQUE IOHEXOL 350 MG/ML SOLN  Comparison:  Chest CT  01/19/2012.  Abdomen pelvis CT 10/09/2009.  CTA CHEST  Findings:  THORACIC INLET/BODY WALL:  Unremarkable appearance of right IJ portacatheter.  Status post left mastectomy with axillary dissection.  Left chest wall subcutaneous nodule enlarged from prior, currently 2.8 x 1.6 cm.  Abnormally lobulated lymph nodes present in the right axilla, measuring up to 2 cm.  These are mildly increased from 2013.  MEDIASTINUM:  Bulky pleural metastatic disease in the upper left chest has progressed since prior, with mass until measuring up to 7 x 7 x 10 cm.  There is extensive contact with the left subclavian artery and the lateral aortic arch. There is effacement of the left brachycephalic vein.The left IMA is partly encased.  Nondominant left vertebral artery is stenotic near its origin, likely secondary to atherosclerosis, unchanged from prior.  No pulmonary embolism detected.  No cardiomegaly or pericardial effusion.  Coronary artery atherosclerosis.  LUNG WINDOWS:  No consolidation.  Minimal patchy opacity in the right upper lobe, nonspecific.  Progressive pleural metastatic disease in the left  chest, with bulky disease in the upper pleural space, as described above.  The costophrenic sulci contained extensive tumor, with erosion into the bony thorax, particularly the ninth rib.  Extensive diaphragmatic involvement, likely with ingrowth, now affecting the spleen.  Left diaphragmatic elevation, likely related to phrenic nerve involvement by tumor.  OSSEOUS:  Direct invasion of the posterior left 9th rib, as above.  As recently demonstrated by MRI, there are pathologic compression fractures of T3 and T4, fixated with posterior rod and screw fixation spanning from T1-T6.  Mild lucencies around the T6 pedicle screws are not changed from prior.  Additional smaller metastatic  deposits as seen on previous MRI, including within the spinous process of T8.  Status post T3 laminectomies.   Review of the MIP images confirms the above findings.  IMPRESSION:  1.  No pulmonary embolism detected. 2.  Progressive thoracic (osseous, left pleural, and left chest wall) metastatic disease compared to 01/19/2012.  3. Elevate, likely paretic left diaphragm.  CT ABDOMEN AND PELVIS  Liver: Ill-defined hypoenhancing lesion in low segment six, measuring 1 centimeter, unchanged from 2011.  Biliary: No evidence of biliary obstruction or stone.  Pancreas: Unremarkable.  Spleen: Scalloping of the upper and posterior surface of the spleen related to tumor growth through the diaphragm.  Adrenals: Unremarkable.  Kidneys and ureters: Numerous hypoenhancing lesions throughout the bilateral kidneys, of varying density.  The most suspicious is in the upper pole left kidney, 15 mm in diameter.  Renal lesions are unlikely to be significant given the extensive metastatic disease.  Bladder: Unremarkable.  Bowel: No obstruction. No pericecal inflammatory changes.  Retroperitoneum: Nodules at the aortic hiatus, compatible with nodal metastatic spread, measuring up to 15 mm on the right.  There borderline enlarged periaortic lymph nodes - not definitely malignant.  Peritoneum: No free fluid or gas.  Reproductive: Hysterectomy.  Vascular: Extensive atherosclerotic calcification of the aortic branch vessels.  OSSEOUS: Sclerotic lesion in the right L2 body, left L3 body, S1 body, and right sacral ala are consistent with metastatic deposits. No pathologic fracture. No suspicious lytic or blastic lesions.  Review of the MIP images confirms the above findings.  IMPRESSION:   No acute intra-abdominal findings.   Original Report Authenticated By: Tiburcio Pea   Dg Abd Acute W/chest  02/26/2013   *RADIOLOGY REPORT*  Clinical Data: Abdominal pain and history of metastatic breast carcinoma.  ACUTE ABDOMEN SERIES (ABDOMEN 2 VIEW &  CHEST 1 VIEW)  Comparison: 02/09/2013  Findings: Stable mediastinal prominence in the chest with soft tissue mass protruding into the left upper lung.  Stable positioning of Port-A-Cath.  Bibasilar scarring and atelectasis present.  Abdominal films show no evidence of acute bowel obstruction or free intraperitoneal air.  No significant ileus is identified.  No abnormal calcifications or soft tissue abnormalities are seen. Visualized bony structures are unremarkable.  IMPRESSION: No acute findings.   Original Report Authenticated By: Irish Lack, M.D.    Other results: EKG: sinus rhythm, no peaked T waves  Assessment & Plan: [name] is a 67 y.o. female with PMH significant for metastatic breast cancer, recent UGIB, recent LUE DVT not on anticoagulation, HTN who presented with chief complaint of abdominal pain, admitted for segmental RLL PE.   #PE- segmental non-obstructive RLL PE. Not noted on CTA (motion artifact) but seen on reformats of CT abdomen.  Known LUE DVT on 8/18, pt not on anticoagulation per below.  Pt denies chest pain, SOB.  -admission for observation, telemetry bed -no  anticoagulation given recent UGIB and goals of care (see below) -palliative care consult to redefine goals of care (d/c telemetry if full comfort care)  #Abdominal pain/nausea/constipation- Abdominal pain likely secondary to healing gastric ulcer.  However, some concern given pt's hemoglobin dropped from 9.3 on 8/27 to 8.4 today.  CT abdomen negative. Chronic nausea thought due to increased ICP from brain mets. Patient reports no bowel movement x 5 days; no bowel regimen at home, pt has also had decreased PO intake x 2 days 2/2 nausea. Pt now nausea-free after taking Zofran in ED and asking to eat.  -regular diet -Zofran 4 mg q6-8h prn -Protonix 40 mg BID  -bowel regimen with Senna-docusate; Dulcolax suppository prn  -morphine prn pain -CBC in morning, consider changing to q12 if Hgb persistently low  and consider GI consult  #Recent GI bleed- Admission on 8/13 for UGIB, Hb 5.1 at that time, pt required 2 units PRBCs, EGD on 02/11/13 showed large 3 cm gastric ulcer, biopsy positive for H. Pylori, now s/p triple therapy. No vomiting, hematochezia, melena. Hgb drop from 9.3 on 8/27 to 8.4 today. -Protonix 40 mg BID  -CBC in AM, see above  #Hypokalemia- K 3.0 on admission, likely secondary to decreased PO intake x 2 days.  -K-dur 40 meq PO once, KCl 10 meq x4   -BMP in AM; consider checking Mg if K persistently low   #Anxiety- Patient tearful throughout interview, no family at home and live out of town; nurse aide comes 7days/wk but not at night.   -alprazolam 0.25 mg once; pt discharged with rx for 0.5 mg TID prn but will start with lowerdose -pt declined chaplain service  #HTN- No home meds. BP elevated to 170s systolic on admission, now down to 151/68 which is near goal for her age, likely pain/anxiety component initially. -continue to monitor  #Metastatic breast cancer/FTT- Lung, bone, brain, kidney mets. Pt followed by oncologist Dr. Truett Perna. Patient refusing all further treatment since her last admission earlier this month.  Albumin 2.2 on today's labs. -palliative care consult to further establish goals of care with family  -consider nutrition consult depending on goals of care  #Code status- Pt reaffirmed wish to be DNR/DNI.   #DVT PPX- SCDS in context of recent admission for GI bleed and goals of care.  Dispo: Disposition is deferred at this time, awaiting improvement of current medical problems. Anticipated discharge in approximately 1 day(s).   The patient does have a current PCP Lorretta Harp, MD) and does need an [facility] hospital follow-up appointment after discharge.   Signed: Rocco Serene, MD 02/26/2013, 11:44 PM

## 2013-02-26 NOTE — ED Notes (Signed)
Pt c/o upper abd pain and nausea. Pt states last BM was 5 days ago. Pt was given 4mg  Zofran en route. Pt has hx of Breast cancer which is in remission. Pt has mastectomy in lt breast. Pt takes morphine for pain at home. 152/80, HR 80 20g in RH.

## 2013-02-26 NOTE — ED Provider Notes (Signed)
CSN: [id number]     Arrival date & time 02/26/13  1649 History   First MD Initiated Contact with Patient 02/26/13 1654     Chief Complaint  Patient presents with  . Abdominal Pain   (Consider location/radiation/quality/duration/timing/severity/associated sxs/prior Treatment) HPI Comments: Patient presents with a five-day history of upper abdominal pain, nausea, inability to have bowel movement. She has a history of metastatic breast cancer that is not being treated, recent admission for GI bleed with 3 cm gastric ulcer, and upper extremity DVT that is not being treated in light of recent GI bleed. She reports feeling nauseated but not vomiting. She still passing gas. She reports her last bowel movement was 5 days ago. Was nonbloody. Reports poor appetite secondary to nausea. She has no fevers, chills, urinary or vaginal symptoms. Palliative care was discussed with patient during her previous admission. She is refusing further treatment for breast cancer.  The history is provided by the EMS personnel and the patient.    Past Medical History  Diagnosis Date  . Cancer 1996    breast  . Hyperlipidemia   . Hypertension   . Asthma     best PEF=400  . 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 Chronic low back pain     MRI 2010 showed right sided disease with paracentral  disc protusion at L5-S1 which contacts and displaces the right S1 nerve root within the lateral recess  . Migraine headache     MRI 11/18/10: Mild progression of prominent white matter type changes which may be related to a small vessel disease and / or migraine headaches.Other causes for white matter type changes such as that secondaryto; vasculitis, inflammatory process or demyelinating process feltto be secondary considerations.  . Leg swelling   . Neuralgia and neuritis   . Tenosynovitis     left, MRI 05/2003  . Herniated disc     h/o  ruptured disk, laminectomy L4-L5, Dr. Montez Morita  . Breast cancer 1996, 2001    f/b Dr. Myrle Sheng. s/p masectomy +radation. w/ metastatic disease, now on Femara.  . Tobacco abuse   . Hx of radiation therapy 06/21/02 -07/13/02    T1-T6  . Bone metastases     breast primary, T spine  . Shortness of breath     exertion  . Hx of migraines   . Hx of radiation therapy 04/21/12    T3-T4 spinal SRS  . Breast cancer metastasized to multiple sites 07/12/2012  . Brain cancer     new 4 cm brain met   Past Surgical History  Procedure Laterality Date  . Abdominal hysterectomy    . Cesarean section    . Mastectomy      left  . Laminectomy      L4-L5  . Joint replacement      Left Knee  . Back surgery    . Breast surgery    . Amputation  11/26/2011    Procedure: AMPUTATION DIGIT;  Surgeon: Kennieth Rad, MD;  Location[facility];  Service: Orthopedics;  Laterality: Bilateral;  PHALANGECTOMY  - Right fifth toe and left third toe  . Rotator cuff repair      pt does not remember year of surgery  . Thoracic laminectomy  04/01/12    T 2-4-metastatic tumor resection  . Esophagogastroduodenoscopy N/A 02/11/2013    Procedure: ESOPHAGOGASTRODUODENOSCOPY (EGD);  Surgeon: Graylin Shiver, MD;  Location: [facility] ENDOSCOPY;  Service: Endoscopy;  Laterality: N/A;  . Colonoscopy N/A 02/11/2013    Procedure: COLONOSCOPY;  Surgeon: Graylin Shiver, MD;  Location: [facility] ENDOSCOPY;  Service: Endoscopy;  Laterality: N/A;   Family History  Problem Relation Age of Onset  . Anesthesia problems Neg Hx   . Hypotension Neg Hx   . Malignant hyperthermia Neg Hx   . Pseudochol deficiency Neg Hx    History  Substance Use Topics  . Smoking status: Former Smoker -- 0.50 packs/day for 50 years    Types: Cigarettes    Quit date: 01/23/2013  . Smokeless tobacco: Never Used     Comment: quit smoking 01/23/13  . Alcohol Use: No   OB History   Grav Para Term Preterm Abortions TAB SAB Ect Mult Living                 Review of Systems   Constitutional: Positive for activity change, appetite change and fatigue. Negative for fever.  HENT: Negative for congestion and rhinorrhea.   Respiratory: Negative for cough, chest tightness and shortness of breath.   Cardiovascular: Negative for chest pain.  Gastrointestinal: Positive for nausea, abdominal pain and constipation. Negative for vomiting, diarrhea and blood in stool.  Genitourinary: Negative for dysuria, hematuria, vaginal bleeding and vaginal discharge.  Musculoskeletal: Negative for back pain.  Neurological: Positive for dizziness and weakness. Negative for headaches.  A complete 10 system review of systems was obtained and all systems are negative except as noted in the HPI and PMH.    Allergies  Banana  Home Medications   Current Outpatient Rx  Name  Route  Sig  Dispense  Refill  . albuterol (PROVENTIL HFA;VENTOLIN HFA) 108 (90 BASE) MCG/ACT inhaler   Inhalation   Inhale 2 puffs into the lungs every 6 (six) hours as needed for wheezing.         [name] morphine (MSIR) 15 MG tablet   Oral   Take 15 mg by mouth every 4 (four) hours as needed for pain.         [name] ondansetron (ZOFRAN) 8 MG tablet   Oral   Take 8 mg by mouth every 8 (eight) hours as needed for nausea.          BP 161/81  Pulse 75  Temp(Src) 99.4 F (37.4 C) (Oral)  Resp 19  SpO2 95% Physical Exam  Constitutional: She is oriented to person, place, and time. She appears well-developed and well-nourished. No distress.  HENT:  Head: Normocephalic and atraumatic.  Mouth/Throat: Oropharynx is clear and moist. No oropharyngeal exudate.  Eyes: Conjunctivae and EOM are normal. Pupils are equal, round, and reactive to light.  Neck: Normal range of motion. Neck supple.  Cardiovascular: Normal rate and regular rhythm.   No murmur heard. Pulmonary/Chest: Effort normal and breath sounds normal. No respiratory distress.  Abdominal: Soft. There is tenderness. There is no rebound and no guarding.   Diffuse tenderness to palpation, no peritoneal signs. Worst in epigastrium   Genitourinary:  No hemorrhoids, no fecal impaction  Musculoskeletal: Normal range of motion. She exhibits no edema and no tenderness.  Neurological: She is alert and oriented to person, place, and time. No cranial nerve deficit. She exhibits normal muscle tone. Coordination normal.  Skin: Skin is warm. She is not diaphoretic.    ED Course  Procedures (including critical care time) Labs Review Labs Reviewed  CBC WITH DIFFERENTIAL - Abnormal; Notable for the following:  RBC 2.98 (*)    Hemoglobin 8.4 (*)    HCT 25.8 (*)    All other components within normal limits  COMPREHENSIVE METABOLIC PANEL - Abnormal; Notable for the following:    Potassium 3.0 (*)    Calcium 7.9 (*)    Total Protein 5.6 (*)    Albumin 2.2 (*)    GFR calc non Af Amer 87 (*)    All other components within normal limits  PROTIME-INR  LIPASE, BLOOD  URINALYSIS, ROUTINE W REFLEX MICROSCOPIC  TROPONIN I  OCCULT BLOOD, POC DEVICE  TYPE AND SCREEN   Imaging Review Dg Abd Acute W/chest  02/26/2013   *RADIOLOGY REPORT*  Clinical Data: Abdominal pain and history of metastatic breast carcinoma.  ACUTE ABDOMEN SERIES (ABDOMEN 2 VIEW & CHEST 1 VIEW)  Comparison: 02/09/2013  Findings: Stable mediastinal prominence in the chest with soft tissue mass protruding into the left upper lung.  Stable positioning of Port-A-Cath.  Bibasilar scarring and atelectasis present.  Abdominal films show no evidence of acute bowel obstruction or free intraperitoneal air.  No significant ileus is identified.  No abnormal calcifications or soft tissue abnormalities are seen. Visualized bony structures are unremarkable.  IMPRESSION: No acute findings.   Original Report Authenticated By: Irish Lack, M.D.    MDM   1. Abdominal pain   2. Metastatic cancer    Upper abdominal pain, nausea, constipation, recent GI bleed with metastatic breast  cancer.  Guaiac-negative. No fecal impaction. No bowel obstruction seen on x-ray. Hemoglobin appears to be stable.  Patient has significant abdominal pain and is tearful. Suspect she may have metastatic involvement in her abdomen. Her previous admission there is discussed with hospice care though she is not yet under hospice care.  We'll obtain CT scan to assure no bowel obstruction. We'll then discuss with internal medicine team for likely admission for pain control. Care signed out to [facility].   Date: 02/26/2013  Rate: 80  Rhythm: normal sinus rhythm  QRS Axis: normal  Intervals: normal  ST/T Wave abnormalities: normal  Conduction Disutrbances:none  Narrative Interpretation:   Old EKG Reviewed: none available    Glynn Octave, MD 02/26/13 2012

## 2013-02-26 NOTE — ED Notes (Signed)
Kathlene November RN unable to take report.  Report given to another RN.  Pt to floor on monitor with EMT.

## 2013-02-26 NOTE — ED Notes (Signed)
CT notified pt completed first cup of contrast.  

## 2013-02-26 NOTE — ED Notes (Signed)
Patient transported to CT 

## 2013-02-27 LAB — CBC
HCT: 23.3 % — ABNORMAL LOW (ref 36.0–46.0)
MCH: 28.1 pg (ref 26.0–34.0)
MCH: 28.6 pg (ref 26.0–34.0)
MCHC: 32.6 g/dL (ref 30.0–36.0)
MCV: 87.7 fL (ref 78.0–100.0)
Platelets: 288 10*3/uL (ref 150–400)
Platelets: 424 10*3/uL — ABNORMAL HIGH (ref 150–400)
RBC: 3.5 MIL/uL — ABNORMAL LOW (ref 3.87–5.11)
RDW: 15 % (ref 11.5–15.5)
RDW: 15.2 % (ref 11.5–15.5)
WBC: 8.3 10*3/uL (ref 4.0–10.5)

## 2013-02-27 LAB — BASIC METABOLIC PANEL
CO2: 23 mEq/L (ref 19–32)
Calcium: 8.8 mg/dL (ref 8.4–10.5)
Creatinine, Ser: 0.84 mg/dL (ref 0.50–1.10)
GFR calc Af Amer: 80 mL/min — ABNORMAL LOW (ref >90)
GFR calc non Af Amer: 69 mL/min — ABNORMAL LOW (ref >90)
Potassium: 4.8 mEq/L (ref 3.5–5.1)
Sodium: 139 mEq/L (ref 135–145)

## 2013-02-27 LAB — CG4 I-STAT (LACTIC ACID): Lactic Acid, Venous: 0.6 mmol/L (ref 0.5–2.2)

## 2013-02-27 LAB — MAGNESIUM: Magnesium: 2.1 mg/dL (ref 1.5–2.5)

## 2013-02-27 MED ORDER — AMOXICILLIN 500 MG PO CAPS
1000.0000 mg | ORAL_CAPSULE | Freq: Two times a day (BID) | ORAL | Status: DC
  Administered 2013-02-27 – 2013-02-28 (×3): 1000 mg via ORAL
  Filled 2013-02-27 (×4): qty 2

## 2013-02-27 MED ORDER — CLARITHROMYCIN 500 MG PO TABS
500.0000 mg | ORAL_TABLET | Freq: Two times a day (BID) | ORAL | Status: DC
  Administered 2013-02-27 – 2013-02-28 (×3): 500 mg via ORAL
  Filled 2013-02-27 (×4): qty 1

## 2013-02-27 NOTE — H&P (Signed)
  Date: 02/27/2013  Patient name: [name] [name]  Medical record number: [id number]  Date of birth: Sep 07, 1945   I have seen and evaluated [name] [name] and discussed their care with the Residency Team. [name] [name] has metastatic breast cancer and has decided not to pursue any aggressive tx. She had a palliative care consult earlier this month and returning home to live independently is her goal. In home hospice was not as option as she would loose her PCS services. Currently, she gets 80 hours weekly and [name] [name] has requested to increased the hours to 100 hours weekly. She lives alone and has no close family in [place] (has niece and granddaughter though). She states that she is independent, able to walk in her home, her aide does most of the cooking but she is able to heat things up or make a sandwich. She has reliable transportation. She is not interested in [facility] at this time but if she is no longer able to live independently, that she would be open to discussing other living arrangements at that time.  She had a large GI bleed from a gastric ulcer earlier this month. Unclear if she is taking the PPI. Her HgB on ulcer dx was 5.6, was D/C'd at 8.3, outpt HgB were 9.7 and 9.3. This admission she was 8.4 and this AM was 7.6. She is willing to have a blood transfusion and / or EGD if needed.  She was found to have a L UE DVT during the admission for the bleeding ulcer. She was not started on blood thinners. She was found to have an incidental PE on CT scan of the ABD.   Assessment and Plan: I have seen and evaluated the patient as outlined above. I agree with the formulated Assessment and Plan as detailed in the residents' admission note, with the following changes:   1. Acute on chronic anemia - follow HgB, guaiac stools. PRBC / GI consult / EGD prn.  2. PE & L UE DVT - not a candidate for anticoag 2/2 actively dropping HgB. The PE is asymptomatic.   3. Metastatic breast cancer - She is  very articulate in what she does and does not want and her goals are reasonable. She is DNR. Once her HgB is stable, she may be D/C'd to home. She will need close F/U. [facility] possibility but she doesn't seem to have any qualifying dx.   [name] Spain, MD 8/31/201410:40 AM

## 2013-02-27 NOTE — ED Provider Notes (Signed)
Medical screening examination/treatment/procedure(s) were performed by non-physician practitioner and as supervising physician I was immediately available for consultation/collaboration.  Layla Maw Ward, DO 02/27/13 [phone]

## 2013-02-27 NOTE — Progress Notes (Addendum)
Palliative Medicine Consult Received and case discussed with on call resident in detail. I also discussed her current situation with her son by phone- he lives in [place], [place] and is going back home tonight. Patient does not want to talk about her cancer-their main concern is getting extended hours through [facility] services which has to be initiated by going through her PCP/Clinic/social worker and approved by Medicaid/contracted in home care provider-who apparently is very good for her and provides excellent care.She is already a DNR, we still need to establish HCPOA formally. Her son [name] [name] is moving back to [place] to help care for her- he understands how serious her condition is at this time. Unfortunately she cannot get Hospice and PCS services at the same time- last admission I recommended [facility] services to follow (they will send out physician and CSW for symptom management/transitions of care, ongoing goals) but referral never materialized because patient left the hospital before we could make/confirm those arrangements-I will specifically request that Care Management see that referral through this time. Our team is happy to continue to assist with care coordination and symptom management. [name] [name] already has her goals clearly established and while technically challenging to provide unofficial "hospice care" we can try to access limited outpatient palliative care resources through [facility] since she lives in [place].  [name] Malta, DO Palliative Medicine

## 2013-02-27 NOTE — Progress Notes (Signed)
Sitting on side of bed, eating.  Respiratory exchange even and unlabored.

## 2013-02-27 NOTE — Progress Notes (Signed)
Utilization review complete. Ji Feldner RN CCM Case Mgmt phone [phone] 

## 2013-02-28 DIAGNOSIS — C7800 Secondary malignant neoplasm of unspecified lung: Secondary | ICD-10-CM | POA: Diagnosis not present

## 2013-02-28 DIAGNOSIS — R1013 Epigastric pain: Secondary | ICD-10-CM | POA: Diagnosis not present

## 2013-02-28 DIAGNOSIS — F411 Generalized anxiety disorder: Secondary | ICD-10-CM | POA: Diagnosis not present

## 2013-02-28 DIAGNOSIS — F17200 Nicotine dependence, unspecified, uncomplicated: Secondary | ICD-10-CM | POA: Diagnosis not present

## 2013-02-28 DIAGNOSIS — R110 Nausea: Secondary | ICD-10-CM | POA: Diagnosis not present

## 2013-02-28 DIAGNOSIS — C7931 Secondary malignant neoplasm of brain: Secondary | ICD-10-CM | POA: Diagnosis not present

## 2013-02-28 DIAGNOSIS — R627 Adult failure to thrive: Secondary | ICD-10-CM | POA: Diagnosis not present

## 2013-02-28 DIAGNOSIS — G43909 Migraine, unspecified, not intractable, without status migrainosus: Secondary | ICD-10-CM | POA: Diagnosis not present

## 2013-02-28 DIAGNOSIS — C7900 Secondary malignant neoplasm of unspecified kidney and renal pelvis: Secondary | ICD-10-CM | POA: Diagnosis not present

## 2013-02-28 DIAGNOSIS — M545 Low back pain: Secondary | ICD-10-CM | POA: Diagnosis not present

## 2013-02-28 DIAGNOSIS — R109 Unspecified abdominal pain: Secondary | ICD-10-CM | POA: Diagnosis present

## 2013-02-28 DIAGNOSIS — E43 Unspecified severe protein-calorie malnutrition: Secondary | ICD-10-CM | POA: Diagnosis not present

## 2013-02-28 DIAGNOSIS — E785 Hyperlipidemia, unspecified: Secondary | ICD-10-CM | POA: Diagnosis not present

## 2013-02-28 DIAGNOSIS — C7951 Secondary malignant neoplasm of bone: Secondary | ICD-10-CM | POA: Diagnosis not present

## 2013-02-28 LAB — CBC
MCH: 28.1 pg (ref 26.0–34.0)
MCHC: 32.1 g/dL (ref 30.0–36.0)
Platelets: 347 10*3/uL (ref 150–400)
Platelets: 372 10*3/uL (ref 150–400)
RBC: 3.14 MIL/uL — ABNORMAL LOW (ref 3.87–5.11)
RBC: 3.03 MIL/uL — ABNORMAL LOW (ref 3.87–5.11)
RDW: 15.3 % (ref 11.5–15.5)
RDW: 15.4 % (ref 11.5–15.5)
WBC: 6 10*3/uL (ref 4.0–10.5)

## 2013-02-28 LAB — BASIC METABOLIC PANEL
BUN: 8 mg/dL (ref 6–23)
Chloride: 104 mEq/L (ref 96–112)
Creatinine, Ser: 0.81 mg/dL (ref 0.50–1.10)
GFR calc Af Amer: 84 mL/min — ABNORMAL LOW (ref >90)
GFR calc non Af Amer: 72 mL/min — ABNORMAL LOW (ref >90)
Potassium: 4 mEq/L (ref 3.5–5.1)
Sodium: 138 mEq/L (ref 135–145)

## 2013-02-28 LAB — MAGNESIUM: Magnesium: 2.1 mg/dL (ref 1.5–2.5)

## 2013-02-28 MED ORDER — PANTOPRAZOLE SODIUM 20 MG PO TBEC: 20.0000 mg | DELAYED_RELEASE_TABLET | Freq: Two times a day (BID) | ORAL | Status: DC

## 2013-02-28 MED ORDER — DSS 100 MG PO CAPS: 50.0000 mg | ORAL_CAPSULE | Freq: Two times a day (BID) | ORAL | Status: DC

## 2013-02-28 NOTE — Discharge Summary (Signed)
  Date: 02/28/2013  Patient name: [name] [name]  Medical record number: [id number]  Date of birth: Jul 13, 1945   This patient has been seen and the plan of care was discussed with the house staff. Please see their note for complete details. I concur with their findings with the following additions/corrections:  Agree with the discharge plan as described by Dr. Oris Drone, MD 02/28/2013, 8:49 AMa

## 2013-02-28 NOTE — Progress Notes (Signed)
All d/c instructions explained and given to pt.  Verbalized understanding.  D/c to home and transported by family members.  Amanda Pea, Charity fundraiser.

## 2013-02-28 NOTE — Progress Notes (Signed)
Received consult to make referral to outpt palliative medicine program with [facility].  Called and spoke with [facility] who handles new referrals.  Faxed facesheet, H&P and palliative md's latest note to Margie at 1230pm today to begin referral process. She stated she would be contacting the patient/family to begin process.

## 2013-02-28 NOTE — Progress Notes (Signed)
Subjective: No complaints today. Eager to go home. No complaints of dizziness or lightheadedness, no c/o of abdominal pain.  Objective: Vital signs in last 24 hours: Filed Vitals:   02/27/13 0500 02/27/13 1355 02/27/13 2059 02/28/13 0441  BP: 133/51 126/59 131/76 122/62  Pulse: 81 91 102 88  Temp: 98 F (36.7 C) 98.4 F (36.9 C) 99 F (37.2 C) 98.7 F (37.1 C)  TempSrc: Oral Oral Oral Oral  Resp: 20 21 20 22   Height:      Weight: 165 lb 2 oz (74.9 kg)   166 lb 1.6 oz (75.342 kg)  SpO2: 96% 97% 96% 94%   Weight change: 15.6 oz (0.443 kg)  Intake/Output Summary (Last 24 hours) at 02/28/13 1210 Last data filed at 02/28/13 1100  Gross per 24 hour  Intake    460 ml  Output   1025 ml  Net   -565 ml   General appearance: alert, cooperative, appears stated age and no distress, lying comfortably in bed. Head: Normocephalic, without obvious abnormality, atraumatic. Lungs: clear to auscultation bilaterally Heart: regular rate and rhythm, S1, S2 normal, no murmur, click, rub or gallop Abdomen: soft, non-tender; bowel sounds normal; no masses,  no palpable organomegaly Extremities: extremities normal, atraumatic, no cyanosis or edema Neurologic: Alert and oriented X 3, Cr N 2-12 intact, strenght normal in all extremities.  Lab Results: Basic Metabolic Panel:  Recent Labs Lab 02/27/13 0830 02/28/13 0440  NA 139 138  K 4.8 4.0  CL 105 104  CO2 23 25  GLUCOSE 116* 87  BUN 6 8  CREATININE 0.84 0.81  CALCIUM 8.8 8.7  MG 2.1 2.1   Liver Function Tests:  Recent Labs Lab 02/23/13 1110 02/26/13 1706  AST 12 13  ALT 12 8  ALKPHOS 65 56  BILITOT 0.34 0.3  PROT 6.8 5.6*  ALBUMIN 2.5* 2.2*    Recent Labs Lab 02/26/13 1706  LIPASE 11   CBC:  Recent Labs Lab 02/23/13 1110 02/26/13 1706  02/28/13 0130 02/28/13 0440  WBC 7.0 6.2  < > 6.0 5.9  NEUTROABS 5.0 4.4  --   --   --   HGB 9.3* 8.4*  < > 8.8* 8.5*  HCT 27.6* 25.8*  < > 27.4* 26.5*  MCV 85.7 86.6  < >  87.3 87.5  PLT 455* 353  < > 347 372  < > = values in this interval not displayed. Cardiac Enzymes:  Recent Labs Lab 02/26/13 1924  TROPONINI <0.30   Urinalysis:  Recent Labs Lab 02/26/13 1939  COLORURINE YELLOW  LABSPEC 1.015  PHURINE 7.0  GLUCOSEU NEGATIVE  HGBUR NEGATIVE  BILIRUBINUR NEGATIVE  KETONESUR 15*  PROTEINUR NEGATIVE  UROBILINOGEN 1.0  NITRITE NEGATIVE  LEUKOCYTESUR SMALL*   Studies/Results: Ct Angio Chest Pe W/cm &/or Wo Cm  02/26/2013   **ADDENDUM** CREATED: 02/26/2013 22:06:41  1.  Positive for segmental sized pulmonary embolism to the right lower lobe, nonobstructive. This area is obscured by respiratory motion on chest CTA, but the embolism is clearly seen on abdominal imaging reformats. 2.  Addendum discussed with PA Heather via telephone at 22:05 on 02/26/2013.  **END ADDENDUM** SIGNED BY: Audry Riles  02/26/2013   *RADIOLOGY REPORT*  Clinical Data:  Upper abdominal pain.  Nausea and constipation. History of breast cancer, no longer under treatment.  CT ANGIOGRAPHY CHEST CT ABDOMEN AND PELVIS WITH CONTRAST  Technique:  Multidetector CT imaging of the chest was performed using the standard protocol during bolus administration of intravenous contrast.  Multiplanar CT image reconstructions including MIPs were obtained to evaluate the vascular anatomy. Multidetector CT imaging of the abdomen and pelvis was performed using the standard protocol during bolus administration of intravenous contrast.  Contrast: OMNIPAQUE IOHEXOL 350 MG/ML SOLN  Comparison:  Chest CT  01/19/2012.  Abdomen pelvis CT 10/09/2009.  CTA CHEST  Findings:  THORACIC INLET/BODY WALL:  Unremarkable appearance of right IJ portacatheter.  Status post left mastectomy with axillary dissection.  Left chest wall subcutaneous nodule enlarged from prior, currently 2.8 x 1.6 cm.  Abnormally lobulated lymph nodes present in the right axilla, measuring up to 2 cm.  These are mildly increased from 2013.   MEDIASTINUM:  Bulky pleural metastatic disease in the upper left chest has progressed since prior, with mass until measuring up to 7 x 7 x 10 cm.  There is extensive contact with the left subclavian artery and the lateral aortic arch. There is effacement of the left brachycephalic vein.The left IMA is partly encased.  Nondominant left vertebral artery is stenotic near its origin, likely secondary to atherosclerosis, unchanged from prior.  No pulmonary embolism detected.  No cardiomegaly or pericardial effusion.  Coronary artery atherosclerosis.  LUNG WINDOWS:  No consolidation.  Minimal patchy opacity in the right upper lobe, nonspecific.  Progressive pleural metastatic disease in the left chest, with bulky disease in the upper pleural space, as described above.  The costophrenic sulci contained extensive tumor, with erosion into the bony thorax, particularly the ninth rib.  Extensive diaphragmatic involvement, likely with ingrowth, now affecting the spleen.  Left diaphragmatic elevation, likely related to phrenic nerve involvement by tumor.  OSSEOUS:  Direct invasion of the posterior left 9th rib, as above.  As recently demonstrated by MRI, there are pathologic compression fractures of T3 and T4, fixated with posterior rod and screw fixation spanning from T1-T6.  Mild lucencies around the T6 pedicle screws are not changed from prior.  Additional smaller metastatic deposits as seen on previous MRI, including within the spinous process of T8.  Status post T3 laminectomies.   Review of the MIP images confirms the above findings.  IMPRESSION:  1.  No pulmonary embolism detected. 2.  Progressive thoracic (osseous, left pleural, and left chest wall) metastatic disease compared to 01/19/2012.  3. Elevate, likely paretic left diaphragm.  CT ABDOMEN AND PELVIS  Liver: Ill-defined hypoenhancing lesion in low segment six, measuring 1 centimeter, unchanged from 2011.  Biliary: No evidence of biliary obstruction or stone.   Pancreas: Unremarkable.  Spleen: Scalloping of the upper and posterior surface of the spleen related to tumor growth through the diaphragm.  Adrenals: Unremarkable.  Kidneys and ureters: Numerous hypoenhancing lesions throughout the bilateral kidneys, of varying density.  The most suspicious is in the upper pole left kidney, 15 mm in diameter.  Renal lesions are unlikely to be significant given the extensive metastatic disease.  Bladder: Unremarkable.  Bowel: No obstruction. No pericecal inflammatory changes.  Retroperitoneum: Nodules at the aortic hiatus, compatible with nodal metastatic spread, measuring up to 15 mm on the right.  There borderline enlarged periaortic lymph nodes - not definitely malignant.  Peritoneum: No free fluid or gas.  Reproductive: Hysterectomy.  Vascular: Extensive atherosclerotic calcification of the aortic branch vessels.  OSSEOUS: Sclerotic lesion in the right L2 body, left L3 body, S1 body, and right sacral ala are consistent with metastatic deposits. No pathologic fracture. No suspicious lytic or blastic lesions.  Review of the MIP images confirms the above findings.  IMPRESSION:   No  acute intra-abdominal findings.   Original Report Authenticated By: Tiburcio Pea   Ct Abdomen Pelvis W Contrast  02/26/2013   **ADDENDUM** CREATED: 02/26/2013 22:06:41  1.  Positive for segmental sized pulmonary embolism to the right lower lobe, nonobstructive. This area is obscured by respiratory motion on chest CTA, but the embolism is clearly seen on abdominal imaging reformats. 2.  Addendum discussed with PA Heather via telephone at 22:05 on 02/26/2013.  **END ADDENDUM** SIGNED BY: Audry Riles  02/26/2013   *RADIOLOGY REPORT*  Clinical Data:  Upper abdominal pain.  Nausea and constipation. History of breast cancer, no longer under treatment.  CT ANGIOGRAPHY CHEST CT ABDOMEN AND PELVIS WITH CONTRAST  Technique:  Multidetector CT imaging of the chest was performed using the standard protocol  during bolus administration of intravenous contrast.  Multiplanar CT image reconstructions including MIPs were obtained to evaluate the vascular anatomy. Multidetector CT imaging of the abdomen and pelvis was performed using the standard protocol during bolus administration of intravenous contrast.  Contrast: OMNIPAQUE IOHEXOL 350 MG/ML SOLN  Comparison:  Chest CT  01/19/2012.  Abdomen pelvis CT 10/09/2009.  CTA CHEST  Findings:  THORACIC INLET/BODY WALL:  Unremarkable appearance of right IJ portacatheter.  Status post left mastectomy with axillary dissection.  Left chest wall subcutaneous nodule enlarged from prior, currently 2.8 x 1.6 cm.  Abnormally lobulated lymph nodes present in the right axilla, measuring up to 2 cm.  These are mildly increased from 2013.  MEDIASTINUM:  Bulky pleural metastatic disease in the upper left chest has progressed since prior, with mass until measuring up to 7 x 7 x 10 cm.  There is extensive contact with the left subclavian artery and the lateral aortic arch. There is effacement of the left brachycephalic vein.The left IMA is partly encased.  Nondominant left vertebral artery is stenotic near its origin, likely secondary to atherosclerosis, unchanged from prior.  No pulmonary embolism detected.  No cardiomegaly or pericardial effusion.  Coronary artery atherosclerosis.  LUNG WINDOWS:  No consolidation.  Minimal patchy opacity in the right upper lobe, nonspecific.  Progressive pleural metastatic disease in the left chest, with bulky disease in the upper pleural space, as described above.  The costophrenic sulci contained extensive tumor, with erosion into the bony thorax, particularly the ninth rib.  Extensive diaphragmatic involvement, likely with ingrowth, now affecting the spleen.  Left diaphragmatic elevation, likely related to phrenic nerve involvement by tumor.  OSSEOUS:  Direct invasion of the posterior left 9th rib, as above.  As recently demonstrated by MRI, there are  pathologic compression fractures of T3 and T4, fixated with posterior rod and screw fixation spanning from T1-T6.  Mild lucencies around the T6 pedicle screws are not changed from prior.  Additional smaller metastatic deposits as seen on previous MRI, including within the spinous process of T8.  Status post T3 laminectomies.   Review of the MIP images confirms the above findings.  IMPRESSION:  1.  No pulmonary embolism detected. 2.  Progressive thoracic (osseous, left pleural, and left chest wall) metastatic disease compared to 01/19/2012.  3. Elevate, likely paretic left diaphragm.  CT ABDOMEN AND PELVIS  Liver: Ill-defined hypoenhancing lesion in low segment six, measuring 1 centimeter, unchanged from 2011.  Biliary: No evidence of biliary obstruction or stone.  Pancreas: Unremarkable.  Spleen: Scalloping of the upper and posterior surface of the spleen related to tumor growth through the diaphragm.  Adrenals: Unremarkable.  Kidneys and ureters: Numerous hypoenhancing lesions throughout the bilateral kidneys, of  varying density.  The most suspicious is in the upper pole left kidney, 15 mm in diameter.  Renal lesions are unlikely to be significant given the extensive metastatic disease.  Bladder: Unremarkable.  Bowel: No obstruction. No pericecal inflammatory changes.  Retroperitoneum: Nodules at the aortic hiatus, compatible with nodal metastatic spread, measuring up to 15 mm on the right.  There borderline enlarged periaortic lymph nodes - not definitely malignant.  Peritoneum: No free fluid or gas.  Reproductive: Hysterectomy.  Vascular: Extensive atherosclerotic calcification of the aortic branch vessels.  OSSEOUS: Sclerotic lesion in the right L2 body, left L3 body, S1 body, and right sacral ala are consistent with metastatic deposits. No pathologic fracture. No suspicious lytic or blastic lesions.  Review of the MIP images confirms the above findings.  IMPRESSION:   No acute intra-abdominal findings.    Original Report Authenticated By: Tiburcio Pea   Dg Abd Acute W/chest  02/26/2013   *RADIOLOGY REPORT*  Clinical Data: Abdominal pain and history of metastatic breast carcinoma.  ACUTE ABDOMEN SERIES (ABDOMEN 2 VIEW & CHEST 1 VIEW)  Comparison: 02/09/2013  Findings: Stable mediastinal prominence in the chest with soft tissue mass protruding into the left upper lung.  Stable positioning of Port-A-Cath.  Bibasilar scarring and atelectasis present.  Abdominal films show no evidence of acute bowel obstruction or free intraperitoneal air.  No significant ileus is identified.  No abnormal calcifications or soft tissue abnormalities are seen. Visualized bony structures are unremarkable.  IMPRESSION: No acute findings.   Original Report Authenticated By: Irish Lack, M.D.   Medications: I have reviewed the patient's current medications. Scheduled Meds: . amoxicillin  1,000 mg Oral Q12H  . clarithromycin  500 mg Oral Q12H  . docusate sodium  100 mg Oral BID  . pantoprazole  20 mg Oral BID  . senna  1 tablet Oral BID  . sodium chloride  3 mL Intravenous Q12H  . sodium chloride  3 mL Intravenous Q12H   Continuous Infusions:  PRN Meds:.sodium chloride, albuterol, bisacodyl, morphine injection, ondansetron (ZOFRAN) IV, ondansetron, oxyCODONE, sodium chloride, traZODone Assessment/Plan: [name] [name] is a 67 y.o. female with PMH significant for metastatic breast cancer, recent UGIB, recent LUE DVT not on anticoagulation, HTN who presented with chief complaint of abdominal pain, admitted for segmental RLL PE.   #PE and Rt upper extremity DVT- segmental non-obstructive RLL PE, Seen on Abd Ct, No chest pain, or cough, no SOB, not hypoxic- Sat- 94-97% in the past 24 hrs. Currently asymptomatic. Known LUE DVT on 8/18, pt not on anticoagulation due to UGIB. Pt denies chest pain, SOB.  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 - Patient ready to go home- D/c patient home today. To follow up hemoglobin closely as an out-pt.  #Abdominal pain/nausea/constipation- Abdominal pain likely secondary to healing gastric ulcer. Currently no complaints of abdominal pain. Nausea likely due to raised ICP. - HB this morning- 8.5 this morning, 7.6 yesterday afternoon, FOBT mildly positive, no current concern for active bleeding, no symptoms of hemodynamic instabilty, no recommendation for PRBC transfusion at this time.  # Metastatic Breast Ca- Initial diagnosis in 1996, Currently has mets to brain, lungs, abdomen, and bone. Patient has elected not to have any more intervention. Pt is DNR.   Dispo: Disposition  is deferred at this time, awaiting improvement of current medical problems.  Anticipated discharge in approximately today.  The patient does have a current PCP Lorretta Harp, MD) and does need an [facility] hospital follow-up appointment after discharge.  The patient does not know have transportation limitations that hinder transportation to clinic appointments.  .Services Needed at time of discharge: Y = Yes, Blank = No PT:   OT:   RN:   Equipment:   Other:     LOS: 2 days   Kennis Carina, MD 02/28/2013, 12:10 PM

## 2013-02-28 NOTE — Discharge Summary (Signed)
Name: [name] [name] MRN: [id number] DOB: Nov 04, 1945 67 y.o. PCP: Lorretta Harp, MD  Date of Admission: 02/26/2013  4:49 PM Date of Discharge: 02/28/2013 Attending Physician: Burns Spain, MD  Discharge Diagnosis: Principal Problem:   Abdominal pain, epigastric Active Problems:   HYPERTENSION   ASTHMA   Breast cancer metastasized to multiple sites   Protein-calorie malnutrition, severe   Nausea alone   Pulmonary embolism   Constipation  Discharge Medications:   Medication List         albuterol 108 (90 BASE) MCG/ACT inhaler  Commonly known as:  PROVENTIL HFA;VENTOLIN HFA  Inhale 2 puffs into the lungs every 6 (six) hours as needed for wheezing.     DSS 100 MG Caps  Take 50 mg by mouth 2 (two) times daily.     morphine 15 MG tablet  Commonly known as:  MSIR  Take 15 mg by mouth every 4 (four) hours as needed for pain.     ondansetron 8 MG tablet  Commonly known as:  ZOFRAN  Take 8 mg by mouth every 8 (eight) hours as needed for nausea.     pantoprazole 20 MG tablet  Commonly known as:  PROTONIX  Take 1 tablet (20 mg total) by mouth 2 (two) times daily.        Disposition and follow-up:   [name] [name] was discharged from [facility] in [place] condition.  At the hospital follow up visit please address:  1.  Please follow up on her hemoglobin levels.  Follow-up Appointments:     Follow-up Information   Follow up with Lorretta Harp, MD.   Specialty:  Internal Medicine   Contact information:   [address] [place] [address] [phone]       Discharge Instructions: Discharge Orders   Future Appointments Provider Department Dept Phone   03/07/2013 10:30 AM Oneita Hurt, MD [facility] RADIATION ONCOLOGY [phone]   03/15/2013 2:15 PM Lorretta Harp, MD [facility] [phone]   03/25/2013 9:15 AM Delcie Roch [facility] MEDICAL [facility] [phone]   03/25/2013 9:45  AM Rana Snare, NP [facility] [address] MEDICAL ONCOLOGY [phone]   03/25/2013 11:15 AM [facility] MEDICAL ONCOLOGY [phone]   Future Orders Complete By Expires   Call MD for:  difficulty breathing, headache or visual disturbances  As directed    Call MD for:  persistant dizziness or light-headedness  As directed    Call MD for:  persistant nausea and vomiting  As directed    Diet - low sodium heart healthy  As directed    Discharge instructions  As directed    Comments:     We will be prescribing a medication for your ulcer called protonix- take one tablet 2 times a day. Please follow up with your previous clinic appointments as already scheduled.   Increase activity slowly  As directed       Consultations:  Palliative Medicine consult. Procedures Performed:  Ct Head Wo Contrast  02/09/2013   *RADIOLOGY REPORT*  Clinical Data: CVA headache, history of breast cancer  CT HEAD WITHOUT CONTRAST  Technique:  Contiguous axial images were obtained from the base of the skull through the vertex without contrast.  Comparison: 01/19/2013  Findings: No intracranial hemorrhage, mass effect or midline shift. Solitary cystic lesion in the posterior aspect of the left hemisphere again noted measures 4 by 2.4 cm stable  in size and appearance from prior exam.  Patchy subcortical white matter decreased attenuation consistent with small vessel ischemic changes again noted.  No acute cortical infarction.  No intraventricular hemorrhage.  IMPRESSION: No significant change.Solitary cystic lesion in the posterior aspect of the left hemisphere again noted measures 4 by 2.4 cm stable in size and appearance from prior exam.  Patchy subcortical white matter decreased attenuation consistent with small vessel ischemic changes again noted.  No acute cortical infarction.  No intraventricular hemorrhage.   Original Report Authenticated By: Natasha Mead, M.D.   Ct Angio Chest Pe  W/cm &/or Wo Cm  02/26/2013   **ADDENDUM** CREATED: 02/26/2013 22:06:41  1.  Positive for segmental sized pulmonary embolism to the right lower lobe, nonobstructive. This area is obscured by respiratory motion on chest CTA, but the embolism is clearly seen on abdominal imaging reformats. 2.  Addendum discussed with PA Heather via telephone at 22:05 on 02/26/2013.  **END ADDENDUM** SIGNED BY: Audry Riles  02/26/2013   *RADIOLOGY REPORT*  Clinical Data:  Upper abdominal pain.  Nausea and constipation. History of breast cancer, no longer under treatment.  CT ANGIOGRAPHY CHEST CT ABDOMEN AND PELVIS WITH CONTRAST  Technique:  Multidetector CT imaging of the chest was performed using the standard protocol during bolus administration of intravenous contrast.  Multiplanar CT image reconstructions including MIPs were obtained to evaluate the vascular anatomy. Multidetector CT imaging of the abdomen and pelvis was performed using the standard protocol during bolus administration of intravenous contrast.  Contrast: OMNIPAQUE IOHEXOL 350 MG/ML SOLN  Comparison:  Chest CT  01/19/2012.  Abdomen pelvis CT 10/09/2009.  CTA CHEST  Findings:  THORACIC INLET/BODY WALL:  Unremarkable appearance of right IJ portacatheter.  Status post left mastectomy with axillary dissection.  Left chest wall subcutaneous nodule enlarged from prior, currently 2.8 x 1.6 cm.  Abnormally lobulated lymph nodes present in the right axilla, measuring up to 2 cm.  These are mildly increased from 2013.  MEDIASTINUM:  Bulky pleural metastatic disease in the upper left chest has progressed since prior, with mass until measuring up to 7 x 7 x 10 cm.  There is extensive contact with the left subclavian artery and the lateral aortic arch. There is effacement of the left brachycephalic vein.The left IMA is partly encased.  Nondominant left vertebral artery is stenotic near its origin, likely secondary to atherosclerosis, unchanged from prior.  No  pulmonary embolism detected.  No cardiomegaly or pericardial effusion.  Coronary artery atherosclerosis.  LUNG WINDOWS:  No consolidation.  Minimal patchy opacity in the right upper lobe, nonspecific.  Progressive pleural metastatic disease in the left chest, with bulky disease in the upper pleural space, as described above.  The costophrenic sulci contained extensive tumor, with erosion into the bony thorax, particularly the ninth rib.  Extensive diaphragmatic involvement, likely with ingrowth, now affecting the spleen.  Left diaphragmatic elevation, likely related to phrenic nerve involvement by tumor.  OSSEOUS:  Direct invasion of the posterior left 9th rib, as above.  As recently demonstrated by MRI, there are pathologic compression fractures of T3 and T4, fixated with posterior rod and screw fixation spanning from T1-T6.  Mild lucencies around the T6 pedicle screws are not changed from prior.  Additional smaller metastatic deposits as seen on previous MRI, including within the spinous process of T8.  Status post T3 laminectomies.   Review of the MIP images confirms the above findings.  IMPRESSION:  1.  No pulmonary embolism detected. 2.  Progressive  thoracic (osseous, left pleural, and left chest wall) metastatic disease compared to 01/19/2012.  3. Elevate, likely paretic left diaphragm.  CT ABDOMEN AND PELVIS  Liver: Ill-defined hypoenhancing lesion in low segment six, measuring 1 centimeter, unchanged from 2011.  Biliary: No evidence of biliary obstruction or stone.  Pancreas: Unremarkable.  Spleen: Scalloping of the upper and posterior surface of the spleen related to tumor growth through the diaphragm.  Adrenals: Unremarkable.  Kidneys and ureters: Numerous hypoenhancing lesions throughout the bilateral kidneys, of varying density.  The most suspicious is in the upper pole left kidney, 15 mm in diameter.  Renal lesions are unlikely to be significant given the extensive metastatic disease.  Bladder:  Unremarkable.  Bowel: No obstruction. No pericecal inflammatory changes.  Retroperitoneum: Nodules at the aortic hiatus, compatible with nodal metastatic spread, measuring up to 15 mm on the right.  There borderline enlarged periaortic lymph nodes - not definitely malignant.  Peritoneum: No free fluid or gas.  Reproductive: Hysterectomy.  Vascular: Extensive atherosclerotic calcification of the aortic branch vessels.  OSSEOUS: Sclerotic lesion in the right L2 body, left L3 body, S1 body, and right sacral ala are consistent with metastatic deposits. No pathologic fracture. No suspicious lytic or blastic lesions.  Review of the MIP images confirms the above findings.  IMPRESSION:   No acute intra-abdominal findings.   Original Report Authenticated By: Tiburcio Pea   Ct Abdomen Pelvis W Contrast  02/26/2013   **ADDENDUM** CREATED: 02/26/2013 22:06:41  1.  Positive for segmental sized pulmonary embolism to the right lower lobe, nonobstructive. This area is obscured by respiratory motion on chest CTA, but the embolism is clearly seen on abdominal imaging reformats. 2.  Addendum discussed with PA Heather via telephone at 22:05 on 02/26/2013.  **END ADDENDUM** SIGNED BY: Audry Riles  02/26/2013   *RADIOLOGY REPORT*  Clinical Data:  Upper abdominal pain.  Nausea and constipation. History of breast cancer, no longer under treatment.  CT ANGIOGRAPHY CHEST CT ABDOMEN AND PELVIS WITH CONTRAST  Technique:  Multidetector CT imaging of the chest was performed using the standard protocol during bolus administration of intravenous contrast.  Multiplanar CT image reconstructions including MIPs were obtained to evaluate the vascular anatomy. Multidetector CT imaging of the abdomen and pelvis was performed using the standard protocol during bolus administration of intravenous contrast.  Contrast: OMNIPAQUE IOHEXOL 350 MG/ML SOLN  Comparison:  Chest CT  01/19/2012.  Abdomen pelvis CT 10/09/2009.  CTA CHEST  Findings:   THORACIC INLET/BODY WALL:  Unremarkable appearance of right IJ portacatheter.  Status post left mastectomy with axillary dissection.  Left chest wall subcutaneous nodule enlarged from prior, currently 2.8 x 1.6 cm.  Abnormally lobulated lymph nodes present in the right axilla, measuring up to 2 cm.  These are mildly increased from 2013.  MEDIASTINUM:  Bulky pleural metastatic disease in the upper left chest has progressed since prior, with mass until measuring up to 7 x 7 x 10 cm.  There is extensive contact with the left subclavian artery and the lateral aortic arch. There is effacement of the left brachycephalic vein.The left IMA is partly encased.  Nondominant left vertebral artery is stenotic near its origin, likely secondary to atherosclerosis, unchanged from prior.  No pulmonary embolism detected.  No cardiomegaly or pericardial effusion.  Coronary artery atherosclerosis.  LUNG WINDOWS:  No consolidation.  Minimal patchy opacity in the right upper lobe, nonspecific.  Progressive pleural metastatic disease in the left chest, with bulky disease in the upper pleural space,  as described above.  The costophrenic sulci contained extensive tumor, with erosion into the bony thorax, particularly the ninth rib.  Extensive diaphragmatic involvement, likely with ingrowth, now affecting the spleen.  Left diaphragmatic elevation, likely related to phrenic nerve involvement by tumor.  OSSEOUS:  Direct invasion of the posterior left 9th rib, as above.  As recently demonstrated by MRI, there are pathologic compression fractures of T3 and T4, fixated with posterior rod and screw fixation spanning from T1-T6.  Mild lucencies around the T6 pedicle screws are not changed from prior.  Additional smaller metastatic deposits as seen on previous MRI, including within the spinous process of T8.  Status post T3 laminectomies.   Review of the MIP images confirms the above findings.  IMPRESSION:  1.  No pulmonary embolism detected. 2.   Progressive thoracic (osseous, left pleural, and left chest wall) metastatic disease compared to 01/19/2012.  3. Elevate, likely paretic left diaphragm.  CT ABDOMEN AND PELVIS  Liver: Ill-defined hypoenhancing lesion in low segment six, measuring 1 centimeter, unchanged from 2011.  Biliary: No evidence of biliary obstruction or stone.  Pancreas: Unremarkable.  Spleen: Scalloping of the upper and posterior surface of the spleen related to tumor growth through the diaphragm.  Adrenals: Unremarkable.  Kidneys and ureters: Numerous hypoenhancing lesions throughout the bilateral kidneys, of varying density.  The most suspicious is in the upper pole left kidney, 15 mm in diameter.  Renal lesions are unlikely to be significant given the extensive metastatic disease.  Bladder: Unremarkable.  Bowel: No obstruction. No pericecal inflammatory changes.  Retroperitoneum: Nodules at the aortic hiatus, compatible with nodal metastatic spread, measuring up to 15 mm on the right.  There borderline enlarged periaortic lymph nodes - not definitely malignant.  Peritoneum: No free fluid or gas.  Reproductive: Hysterectomy.  Vascular: Extensive atherosclerotic calcification of the aortic branch vessels.  OSSEOUS: Sclerotic lesion in the right L2 body, left L3 body, S1 body, and right sacral ala are consistent with metastatic deposits. No pathologic fracture. No suspicious lytic or blastic lesions.  Review of the MIP images confirms the above findings.  IMPRESSION:   No acute intra-abdominal findings.   Original Report Authenticated By: Tiburcio Pea   Dg Abd Acute W/chest  02/26/2013   *RADIOLOGY REPORT*  Clinical Data: Abdominal pain and history of metastatic breast carcinoma.  ACUTE ABDOMEN SERIES (ABDOMEN 2 VIEW & CHEST 1 VIEW)  Comparison: 02/09/2013  Findings: Stable mediastinal prominence in the chest with soft tissue mass protruding into the left upper lung.  Stable positioning of Port-A-Cath.  Bibasilar scarring and  atelectasis present.  Abdominal films show no evidence of acute bowel obstruction or free intraperitoneal air.  No significant ileus is identified.  No abnormal calcifications or soft tissue abnormalities are seen. Visualized bony structures are unremarkable.  IMPRESSION: No acute findings.   Original Report Authenticated By: Irish Lack, M.D.   Dg Abd Acute W/chest  02/09/2013   *RADIOLOGY REPORT*  Clinical Data: Nausea vomiting.  Abdominal pain.  History breast cancer and asthma.  ACUTE ABDOMEN SERIES (ABDOMEN 2 VIEW & CHEST 1 VIEW)  Comparison: 02/23/2012 chest film.  Findings: Frontal view of the chest demonstrates surgical clips about the left axilla.  Left mastectomy.  Right-sided Port-A-Cath which terminates at the mid to low SVC.  Upper thoracic spine fixation.  Left rotator cuff repair.  Moderate left hemidiaphragm elevation. No pleural effusion or pneumothorax.  Mild tracheal deviation to the right, positional.  Normal heart size.  Left superior mediastinal and adjacent  medial left upper lobe soft tissue fullness, grossly similar to the 02/23/2012 exam.  Volume loss at the left lung base.  Abominal films demonstrate no free intraperitoneal air or significant air fluid levels on upright positioning.  No bowel distention. Distal gas and stool.  IMPRESSION: No acute findings in the abdomen/pelvis.  Similar superior left sided mediastinal and adjacent upper lobe soft tissue fullness.  Incompletely characterized.  Similar left hemidiaphragm elevation.   Original Report Authenticated By: Jeronimo Greaves, M.D.   Admission HPI: Chief Complaint: abdominal pain  History of Present Illness:  Ms. Mault is a 67 year old woman with history of metastatic breast cancer (no longer undergoing treatment), recent UGIB, recent LUE DVT (not on anticoagulation), HTN admitted for abdominal pain and nonobstructive RLL segmental PE seen on CT abdomen imaging reformats (not seen on CTA). Pt was recently admitted on 02/09/13  for UGIB (Hb 5.1 on presentation, required 2 units PRBCs), EGD on 02/11/13 showed large 3 cm gastric ulcer, biopsy positive for H. Pylori, now s/p triple therapy. During that admission, she was also found to have LUE axillary DVT but was discharged home without anticoagulation given UGIB and goals of care per palliative care.  Patient states that she began having epigastric abdominal pain a couple of days ago, associated with nausea but no vomiting. She also has not had a bowel movement in 5 days though she has been passing gas. Denies fever/chills, diarrhea, hematochezia, melena, chest pain, SOB hemoptysis. States that she is no longer feeling nauseated after ondansetron in the ED, is quite hungry and would like to eat.  Patient has metastatic breast cancer diagnosed in 1996, ER positive 100%; PR positive 53%; HER-2/neu positive by [facility]. She is status post left mastectomy and axillary node dissection, but did not complete chemotherapy course and had reoccurrence of her cancer in 2010. 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 She began chemotherapy treatment with Taxol/Herceptin/Pertuzumab on 12/29/12 but stopped about one month later due to persistent nausea. She is followed by Dr. Truett Perna, and per his note, her nausea was thought to be at least partially due to increased ICP from brain mets. She was also previosuly receiving palliative radiation therapy to the head (last 02/03/13) but she is now refusing all further treatment. She met with by palliative care, Dr. Phillips Odor, at her last admission and was planning for hospice care.  On 8/18, patient's LUE was swollen, and UE Doppler showed acute DVT in the left axillary and brachial veins. In setting of UGIB, outpatient anticoagulation was not an option, but patient strongly desired to go home. Given the patient's new goals of care and end of life goals, decision was made to discharge to home without anticoagulation.  Patient still does not desire anti-coagulation  and reiterated her DNR/DNI status.  In the ED, CTA negative for PE but segmental RLL PE seen on CT abdomen (no acute intra-abdominal findings). Troponin negative, lipase within normal limits. Of note, her hemoglobin is 8.4 today (down from 9.3 on 9/27), K 3.0.   General: alert, cooperative, pt crying throughout interview saying she "just wants to go home" HEENT: vision grossly intact, oropharynx clear and non-erythematous  Neck: supple, no lymphadenopathy  Chest: s/p left mastectomy  Lungs: clear to ascultation bilaterally, normal work of respiration, no wheezes, rales, ronchi Heart: tachycardic to 90s, regular rate and rhythm, no murmurs, gallops, or rubs Abdomen: tenderness to palpation in epigastric area, soft, non-distended, normal bowel sounds  Extremities: 1+ pitting edema to bilateral mid-shins; no  cyanosis or clubbing Neurologic: alert & oriented X3, cranial nerves II-XII intact, strength grossly intact, sensation intact to light touch   Hospital Course by problem list: Principal Problem:   Abdominal pain, epigastric Active Problems:   HYPERTENSION   ASTHMA   Breast cancer metastasized to multiple sites   Protein-calorie malnutrition, severe   Nausea alone   Pulmonary embolism   Constipation   #Abdominal pain/nausea/constipation- Abdominal pain likely secondary to healing gastric ulcer, large gastric ulcer ~3cm seen in gastric fundus- 02/11/2013. Patient was given triple therapy. Notes dont exactly specify when triple therapy was instituted and completed. Nausea likely due to raised ICP as pt has mets to brain. Constipation likley due to opioid use. FOBT was mildly positive. Abd Xrays and Ct done on admission showed no explanatory findings for the abd pain. Hb on admission- 8.4, on discharge- 8.5. Patient was hemodynamically stable, so no indictations for transfusions during this admission. Odansetron- 4mg  IV, pantoprazole- 20mg  BID, colace, senakot and docusate suppository. On  discharge, pt had no complains of abdominal pain, was able to eat.  #PE and Rt upper extremity DVT- Though patient was asymptomatic, a segmental non-obstructive RLL PE,was Seen on Abd Ct, but not on chest CTA. Known LUE DVT on 8/18, pt not on anticoagulation due to UGIB. -Palliative care consult to help redefine goals of care- Patient was d/c home as she has home health services 80hrs a week which she would loose if she got home hospice.   # Metastatic Breast Ca- Initial diagnosis in 1996, Currently has mets to brain, lungs, abdomen, and bone. Pt followed by Dr. Myrle Sheng. Patient has elected not to have any more intervention. Pt is DNR. Palliative care consult while on admission. Discharged home with to continue with home health services.  # HTN- Was stable and WNL while on admission, without medication.  Discharge Vitals:   BP 122/62  Pulse 88  Temp(Src) 98.7 F (37.1 C) (Oral)  Resp 22  Ht 5\' 8"  (1.727 m)  Wt 166 lb 1.6 oz (75.342 kg)  BMI 25.26 kg/m2  SpO2 94%  Discharge Labs:  Results for orders placed during the hospital encounter of 02/26/13 (from the past 24 hour(s))  CBC     Status: Abnormal   Collection Time    02/27/13  6:20 PM      Result Value Range   WBC 8.3  4.0 - 10.5 K/uL   RBC 3.50 (*) 3.87 - 5.11 MIL/uL   Hemoglobin 10.0 (*) 12.0 - 15.0 g/dL   HCT 16.1 (*) 09.6 - 04.5 %   MCV 87.7  78.0 - 100.0 fL   MCH 28.6  26.0 - 34.0 pg   MCHC 32.6  30.0 - 36.0 g/dL   RDW 40.9  81.1 - 91.4 %   Platelets 424 (*) 150 - 400 K/uL  CBC     Status: Abnormal   Collection Time    02/28/13  1:30 AM      Result Value Range   WBC 6.0  4.0 - 10.5 K/uL   RBC 3.14 (*) 3.87 - 5.11 MIL/uL   Hemoglobin 8.8 (*) 12.0 - 15.0 g/dL   HCT 78.2 (*) 95.6 - 21.3 %   MCV 87.3  78.0 - 100.0 fL   MCH 28.0  26.0 - 34.0 pg   MCHC 32.1  30.0 - 36.0 g/dL   RDW 08.6  57.8 - 46.9 %   Platelets 347  150 - 400 K/uL  BASIC METABOLIC PANEL     Status:  Abnormal   Collection Time    02/28/13  4:40 AM       Result Value Range   Sodium 138  135 - 145 mEq/L   Potassium 4.0  3.5 - 5.1 mEq/L   Chloride 104  96 - 112 mEq/L   CO2 25  19 - 32 mEq/L   Glucose, Bld 87  70 - 99 mg/dL   BUN 8  6 - 23 mg/dL   Creatinine, Ser 1.61  0.50 - 1.10 mg/dL   Calcium 8.7  8.4 - 09.6 mg/dL   GFR calc non Af Amer 72 (*) >90 mL/min   GFR calc Af Amer 84 (*) >90 mL/min  MAGNESIUM     Status: None   Collection Time    02/28/13  4:40 AM      Result Value Range   Magnesium 2.1  1.5 - 2.5 mg/dL  CBC     Status: Abnormal   Collection Time    02/28/13  4:40 AM      Result Value Range   WBC 5.9  4.0 - 10.5 K/uL   RBC 3.03 (*) 3.87 - 5.11 MIL/uL   Hemoglobin 8.5 (*) 12.0 - 15.0 g/dL   HCT 04.5 (*) 40.9 - 81.1 %   MCV 87.5  78.0 - 100.0 fL   MCH 28.1  26.0 - 34.0 pg   MCHC 32.1  30.0 - 36.0 g/dL   RDW 91.4  78.2 - 95.6 %   Platelets 372  150 - 400 K/uL    Signed: Kennis Carina, MD 02/28/2013, 1:17 PM   Time Spent on Discharge: 35 minutes

## 2013-03-06 ENCOUNTER — Encounter: Payer: Self-pay | Admitting: Radiation Oncology

## 2013-03-07 ENCOUNTER — Ambulatory Visit: Admission: RE | Admit: 2013-03-07 | Payer: Medicare HMO | Source: Ambulatory Visit | Admitting: Radiation Oncology

## 2013-03-15 ENCOUNTER — Encounter: Payer: Medicare HMO | Admitting: Internal Medicine

## 2013-03-15 ENCOUNTER — Telehealth: Payer: Self-pay | Admitting: *Deleted

## 2013-03-15 NOTE — Telephone Encounter (Signed)
Reviewed earlier note with Dr. Truett Perna: OK to extend [facility] RN visits for additional 2 weeks to assist with medication management. Pt has an upcoming appt in this office and will address further needs at that time. [employer] with verbal order.

## 2013-03-15 NOTE — Telephone Encounter (Signed)
Call from [facility] with [facility] reporting pt has a L breast lesion with open area that measures 0.2X0.5 cm. Does not look infected. Per Arline Asp, pt says this "comes and goes". Asking if Dr. Truett Perna recommends the nurses do anything to address this area. Pt was referred to home health by ID MD upon DC from hospital. Palliative Care MD saw pt and asked home health RN to address this although they would not give any orders. Reviewed with Dr. Truett Perna: no dressing needed. This has been there over 2 years. Keep area clean and dry. Called [facility] with this info. She is requesting orders to continue to see pt for 2 more weeks. They have been unable to get orders from original MD or PCP.

## 2013-03-18 ENCOUNTER — Other Ambulatory Visit: Payer: Self-pay | Admitting: Internal Medicine

## 2013-03-18 MED ORDER — DOCUSATE SODIUM 50 MG PO CAPS: 50.0000 mg | ORAL_CAPSULE | Freq: Two times a day (BID) | ORAL | Status: DC

## 2013-03-23 ENCOUNTER — Encounter: Payer: Self-pay | Admitting: Internal Medicine

## 2013-03-23 ENCOUNTER — Ambulatory Visit (INDEPENDENT_AMBULATORY_CARE_PROVIDER_SITE_OTHER): Payer: Medicare HMO | Admitting: Internal Medicine

## 2013-03-23 VITALS — BP 130/80 | HR 82 | Temp 98.3°F | Ht 68.0 in | Wt 163.4 lb

## 2013-03-23 DIAGNOSIS — K5900 Constipation, unspecified: Secondary | ICD-10-CM

## 2013-03-23 DIAGNOSIS — M549 Dorsalgia, unspecified: Secondary | ICD-10-CM

## 2013-03-23 DIAGNOSIS — D649 Anemia, unspecified: Secondary | ICD-10-CM

## 2013-03-23 DIAGNOSIS — I2699 Other pulmonary embolism without acute cor pulmonale: Secondary | ICD-10-CM

## 2013-03-23 DIAGNOSIS — R1013 Epigastric pain: Secondary | ICD-10-CM

## 2013-03-23 LAB — CBC
HCT: 30.2 % — ABNORMAL LOW (ref 36.0–46.0)
Platelets: 303 10*3/uL (ref 150–400)
RBC: 3.54 MIL/uL — ABNORMAL LOW (ref 3.87–5.11)
RDW: 16.2 % — ABNORMAL HIGH (ref 11.5–15.5)
WBC: 8.2 10*3/uL (ref 4.0–10.5)

## 2013-03-23 MED ORDER — MORPHINE SULFATE 15 MG PO TABS: 15.0000 mg | ORAL_TABLET | ORAL | Status: DC | PRN

## 2013-03-23 MED ORDER — DOCUSATE SODIUM 50 MG PO CAPS: 100.0000 mg | ORAL_CAPSULE | Freq: Two times a day (BID) | ORAL | Status: DC | PRN

## 2013-03-23 NOTE — Assessment & Plan Note (Signed)
She has a segmental non-obstructive RLL PE, which was seen on Abd Ct, but not on chest CTA. Known LUE DVT on 8/18, pt is not on anticoagulation due to UGIB. Currently patient is asymptomatic. Will follow up.

## 2013-03-23 NOTE — Progress Notes (Signed)
Patient ID: [name] [name], female   DOB: 1945/07/11, 67 y.o.   MRN: [id number] Subjective:   Patient ID: [name] [name] female   DOB: 26-Oct-1945 67 y.o.   MRN: [id number]  CC:    Hospital followup visit.    HPI:  [name] is a 67 y.o. lady with past medical history as outlined below, who presents for a hospital followup visit today  1. Nausea and abdominal pain: Patient has history of metastatic breast cancer. Currently patient is on palliative care and DNR. Patient was recently hospitalized due to nausea and abdominal pain from 8/30 to 9/1. Patient has history of gastric ulcer with positive Helicobacter pylorus. She completed antibiotics treatment for H. Pylori. Abd Xrays and Ct done on admission showed no explanatory findings for the abd pain. Patient's abdominal pain was thought most likely due to her gastric ulcer. She was treate with Zofran, Protonix, Colace, Senokot in hopital. Her symptoms improved significantly. She was discharged at stable condition. Patient has a positive FOBT hospital. She was hemodynamically stable. Her hemoglobin was 8.5 on discharge. Today patient reports that her nausea and abdominal pain have completely resolved.  2. HTN: She is not on any medications. Her blood pressure was 152/81 when she came in to clinic. The repeated blood pressure was 130/80 which is normal. She does not have chest pain, shortness of breath, palpitation or leg edema.   3. PE and Rt upper extremity DVT- Patient was asymptomatic, a segmental non-obstructive RLL PE was seen on Abd Ct, but not on chest CTA. Known LUE DVT on 8/18, pt not on anticoagulation due to UGIB. Palliative care was consulted to help redefine goals of care- Patient was d/c'ed home. She has home health services, 80hrs a week.   4. Metastatic Breast Ca- Initial diagnosis in 1996, Currently has mets to brain, lungs, abdomen, and bone. Pt followed by Dr. Myrle Sheng. Patient has elected not to have any more intervention. Pt is  DNR. Palliative care consult while on admission. Discharged home with to continue with home health services.  ROS:  Denies fever, chills, fatigue, headaches,  cough, chest pain, SOB,  abdominal pain,diarrhea, constipation, dysuria, urgency, frequency, hematuria, joint pain or leg swelling.   Past Medical History  Diagnosis Date  . Cancer 1996    breast  . Hyperlipidemia   . Hypertension   . Asthma     best PEF=400  . 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 Chronic low back pain     MRI 2010 showed right sided disease with paracentral  disc protusion at L5-S1 which contacts and displaces the right S1 nerve root within the lateral recess  . Migraine headache     MRI 11/18/10: Mild progression of prominent white matter type changes which may be related to a small vessel disease and / or migraine headaches.Other causes for white matter type changes such as that secondaryto; vasculitis, inflammatory process or demyelinating process feltto be secondary considerations.  . Leg swelling   . Neuralgia and neuritis   . Tenosynovitis     left, MRI 05/2003  . Herniated disc     h/o ruptured disk, laminectomy L4-L5, Dr. Montez Morita  . Breast cancer 1996, 2001    f/b Dr. Myrle Sheng. s/p masectomy +radation. w/ metastatic disease, now on Femara.  . Tobacco abuse   . Hx of radiation therapy 06/21/02 -07/13/02  T1-T6  . Bone metastases     breast primary, T spine  . Shortness of breath     exertion  . Hx of migraines   . Hx of radiation therapy 04/21/12    T3-T4 spinal SRS  . Breast cancer metastasized to multiple sites 07/12/2012  . Brain cancer     new 4 cm brain met   Current Outpatient Prescriptions  Medication Sig Dispense Refill  . albuterol (PROVENTIL HFA;VENTOLIN HFA) 108 (90 BASE) MCG/ACT inhaler Inhale 2 puffs into the lungs every 6 (six) hours as needed for wheezing.      . docusate sodium (COLACE) 50  MG capsule Take 2 capsules (100 mg total) by mouth 2 (two) times daily as needed for constipation.  30 capsule  0  . morphine (MSIR) 15 MG tablet Take 1 tablet (15 mg total) by mouth every 4 (four) hours as needed for pain.  90 tablet  0  . ondansetron (ZOFRAN) 8 MG tablet Take 8 mg by mouth every 8 (eight) hours as needed for nausea.      . pantoprazole (PROTONIX) 20 MG tablet Take 1 tablet (20 mg total) by mouth 2 (two) times daily.  60 tablet  2   No current facility-administered medications for this visit.   Family History  Problem Relation Age of Onset  . Anesthesia problems Neg Hx   . Hypotension Neg Hx   . Malignant hyperthermia Neg Hx   . Pseudochol deficiency Neg Hx    History   Social History  . Marital Status: Divorced    Spouse Name: N/A    Number of Children: N/A  . Years of Education: N/A   Social History Main Topics  . Smoking status: Former Smoker -- 0.50 packs/day for 50 years    Types: Cigarettes    Quit date: 01/23/2013  . Smokeless tobacco: Never Used     Comment: quit smoking 01/23/13  . Alcohol Use: No  . Drug Use: Yes    Special: Oxycodone  . Sexual Activity: Not Currently   Other Topics Concern  . None   Social History Narrative  . None    Review of Systems: Full 14-point review of systems otherwise negative. See HPI.   Objective:  Physical Exam: Filed Vitals:   03/23/13 0915 03/23/13 0916  BP: 152/81 130/80  Pulse: 82   Temp: 98.3 F (36.8 C)   TempSrc: Oral   Height: 5\' 8"  (1.727 m)   Weight: 163 lb 6.4 oz (74.118 kg)   SpO2: 98%    Constitutional: Vital signs reviewed. No acute distress and cooperative with exam.   HEENT:  Head: Normocephalic and atraumatic Mouth: no erythema or exudates, MMM Eyes: PERRL, EOMI, conjunctivae normal, No scleral icterus.  Neck: Supple, Trachea midline normal ROM, No JVD Cardiovascular: RRR, S1 normal, S2 normal, no MRG, pulses symmetric and intact bilaterally Pulmonary/Chest: CTAB, no wheezes,  rales, or rhonchi Abdominal: Soft. Non-tender, non-distended, bowel sounds are normal, no masses, organomegaly, or guarding present.  Musculoskeletal: No joint deformities, erythema, or stiffness, ROM full and non-tender Extremities: No leg edema Hematology: no cervical, inginal, or axillary adenopathy.  Neurological: A&O x3, Strength is normal and symmetric bilaterally, cranial nerve II-XII are grossly intact, no focal motor deficit, sensory intact to light touch bilaterally.  Skin: Warm, dry and intact. No rash, cyanosis, or clubbing.  Psychiatric: Normal mood and affect. No suicidal or homicidal ideation.  Assessment & Plan:

## 2013-03-23 NOTE — Assessment & Plan Note (Addendum)
The patient's nausea and abdominal pain have completely resolved. Teh repeated CBC showed hemoglobin is stable. Today hemoglobin is 9.9. She is currently taking Zofran and Protonix. Wil continue current regimen.

## 2013-03-23 NOTE — Patient Instructions (Signed)
1. You have done great job in taking all your medications. I appreciate it very much. Please continue doing that. 2. Please take all medications as prescribed.  3. If you have worsening of your symptoms or new symptoms arise, please call the clinic ([phone]), or go to the ER immediately if symptoms are severe.     

## 2013-03-23 NOTE — Assessment & Plan Note (Signed)
Initial diagnosis in 1996, Currently has mets to brain, lungs, abdomen, and bone. Pt followed by Dr. Myrle Sheng. Patient has elected not to have any more intervention. Pt is DNR. No further treatment needed. Will follow up.

## 2013-03-24 NOTE — Progress Notes (Signed)
Case discussed with Dr. Niu at the time of the visit.  We reviewed the resident's history and exam and pertinent patient test results.  I agree with the assessment, diagnosis, and plan of care documented in the resident's note.    

## 2013-03-25 ENCOUNTER — Telehealth: Payer: Self-pay | Admitting: Oncology

## 2013-03-25 ENCOUNTER — Other Ambulatory Visit (HOSPITAL_BASED_OUTPATIENT_CLINIC_OR_DEPARTMENT_OTHER): Payer: Commercial Managed Care - HMO | Admitting: Lab

## 2013-03-25 ENCOUNTER — Ambulatory Visit: Payer: Commercial Managed Care - HMO

## 2013-03-25 ENCOUNTER — Ambulatory Visit (HOSPITAL_BASED_OUTPATIENT_CLINIC_OR_DEPARTMENT_OTHER): Payer: Medicare HMO | Admitting: Nurse Practitioner

## 2013-03-25 VITALS — BP 135/81 | HR 84 | Temp 97.5°F | Resp 18 | Ht 68.0 in | Wt 164.4 lb

## 2013-03-25 DIAGNOSIS — C44599 Other specified malignant neoplasm of skin of other part of trunk: Secondary | ICD-10-CM

## 2013-03-25 DIAGNOSIS — D649 Anemia, unspecified: Secondary | ICD-10-CM

## 2013-03-25 DIAGNOSIS — C7931 Secondary malignant neoplasm of brain: Secondary | ICD-10-CM

## 2013-03-25 DIAGNOSIS — C50919 Malignant neoplasm of unspecified site of unspecified female breast: Secondary | ICD-10-CM

## 2013-03-25 DIAGNOSIS — C787 Secondary malignant neoplasm of liver and intrahepatic bile duct: Secondary | ICD-10-CM

## 2013-03-25 DIAGNOSIS — C7951 Secondary malignant neoplasm of bone: Secondary | ICD-10-CM

## 2013-03-25 DIAGNOSIS — M549 Dorsalgia, unspecified: Secondary | ICD-10-CM

## 2013-03-25 DIAGNOSIS — I82409 Acute embolism and thrombosis of unspecified deep veins of unspecified lower extremity: Secondary | ICD-10-CM

## 2013-03-25 DIAGNOSIS — J3489 Other specified disorders of nose and nasal sinuses: Secondary | ICD-10-CM

## 2013-03-25 DIAGNOSIS — R0602 Shortness of breath: Secondary | ICD-10-CM

## 2013-03-25 LAB — CBC WITH DIFFERENTIAL/PLATELET
Basophils Absolute: 0 10*3/uL (ref 0.0–0.1)
Eosinophils Absolute: 0.2 10*3/uL (ref 0.0–0.5)
HGB: 9.8 g/dL — ABNORMAL LOW (ref 11.6–15.9)
MCV: 85.3 fL (ref 79.5–101.0)
NEUT#: 4.8 10*3/uL (ref 1.5–6.5)
RDW: 16.5 % — ABNORMAL HIGH (ref 11.2–14.5)
lymph#: 2.1 10*3/uL (ref 0.9–3.3)

## 2013-03-25 MED ORDER — SODIUM CHLORIDE 0.9 % IJ SOLN
10.0000 mL | INTRAMUSCULAR | Status: DC | PRN
  Administered 2013-03-25: 10 mL via INTRAVENOUS
  Filled 2013-03-25: qty 10

## 2013-03-25 MED ORDER — HEPARIN SOD (PORK) LOCK FLUSH 100 UNIT/ML IV SOLN
500.0000 [IU] | Freq: Once | INTRAVENOUS | Status: AC
  Administered 2013-03-25: 500 [IU] via INTRAVENOUS
  Filled 2013-03-25: qty 5

## 2013-03-25 NOTE — Telephone Encounter (Signed)
gv adn printed appt sched and avs for pt for OCT thru Dec °

## 2013-03-25 NOTE — Progress Notes (Signed)
[facility] referral faxed to referral center [phone]. Code status not addressed at this time.

## 2013-03-25 NOTE — Patient Instructions (Addendum)
Implanted Port Instructions  An implanted port is a central line that has a round shape and is placed under the skin. It is used for long-term IV (intravenous) access for:  · Medicine.  · Fluids.  · Liquid nutrition, such as TPN (total parenteral nutrition).  · Blood samples.  Ports can be placed:  · In the chest area just below the collarbone (this is the most common place.)  · In the arms.  · In the belly (abdomen) area.  · In the legs.  PARTS OF THE PORT  A port has 2 main parts:  · The reservoir. The reservoir is round, disc-shaped, and will be a small, raised area under your skin.  · The reservoir is the part where a needle is inserted (accessed) to either give medicines or to draw blood.  · The catheter. The catheter is a long, slender tube that extends from the reservoir. The catheter is placed into a large vein.  · Medicine that is inserted into the reservoir goes into the catheter and then into the vein.  INSERTION OF THE PORT  · The port is surgically placed in either an operating room or in a procedural area (interventional radiology).  · Medicine may be given to help you relax during the procedure.  · The skin where the port will be inserted is numbed (local anesthetic).  · 1 or 2 small cuts (incisions) will be made in the skin to insert the port.  · The port can be used after it has been inserted.  INCISION SITE CARE  · The incision site may have small adhesive strips on it. This helps keep the incision site closed. Sometimes, no adhesive strips are placed. Instead of adhesive strips, a special kind of surgical glue is used to keep the incision closed.  · If adhesive strips were placed on the incision sites, do not take them off. They will fall off on their own.  · The incision site may be sore for 1 to 2 days. Pain medicine can help.  · Do not get the incision site wet. Bathe or shower as directed by your caregiver.  · The incision site should heal in 5 to 7 days. A small scar may form after the  incision has healed.  ACCESSING THE PORT  Special steps must be taken to access the port:  · Before the port is accessed, a numbing cream can be placed on the skin. This helps numb the skin over the port site.  · A sterile technique is used to access the port.  · The port is accessed with a needle. Only "non-coring" port needles should be used to access the port. Once the port is accessed, a blood return should be checked. This helps ensure the port is in the vein and is not clogged (clotted).  · If your caregiver believes your port should remain accessed, a clear (transparent) bandage will be placed over the needle site. The bandage and needle will need to be changed every week or as directed by your caregiver.  · Keep the bandage covering the needle clean and dry. Do not get it wet. Follow your caregiver's instructions on how to take a shower or bath when the port is accessed.  · If your port does not need to stay accessed, no bandage is needed over the port.  FLUSHING THE PORT  Flushing the port keeps it from getting clogged. How often the port is flushed depends on:  · If a   constant infusion is running. If a constant infusion is running, the port may not need to be flushed.  · If intermittent medicines are given.  · If the port is not being used.  For intermittent medicines:  · The port will need to be flushed:  · After medicines have been given.  · After blood has been drawn.  · As part of routine maintenance.  · A port is normally flushed with:  · Normal saline.  · Heparin.  · Follow your caregiver's advice on how often, how much, and the type of flush to use on your port.  IMPORTANT PORT INFORMATION  · Tell your caregiver if you are allergic to heparin.  · After your port is placed, you will get a manufacturer's information card. The card has information about your port. Keep this card with you at all times.  · There are many types of ports available. Know what kind of port you have.  · In case of an  emergency, it may be helpful to wear a medical alert bracelet. This can help alert health care workers that you have a port.  · The port can stay in for as long as your caregiver believes it is necessary.  · When it is time for the port to come out, surgery will be done to remove it. The surgery will be similar to how the port was put in.  · If you are in the hospital or clinic:  · Your port will be taken care of and flushed by a nurse.  · If you are at home:  · A home health care nurse may give medicines and take care of the port.  · You or a family member can get special training and directions for giving medicine and taking care of the port at home.  SEEK IMMEDIATE MEDICAL CARE IF:   · Your port does not flush or you are unable to get a blood return.  · New drainage or pus is coming from the incision.  · A bad smell is coming from the incision site.  · You develop swelling or increased redness at the incision site.  · You develop increased swelling or pain at the port site.  · You develop swelling or pain in the surrounding skin near the port.  · You have an oral temperature above 102° F (38.9° C), not controlled by medicine.  MAKE SURE YOU:   · Understand these instructions.  · Will watch your condition.  · Will get help right away if you are not doing well or get worse.  Document Released: 06/16/2005 Document Revised: 09/08/2011 Document Reviewed: 09/07/2008  [employer]® Patient Information ©2014 [employer].

## 2013-03-25 NOTE — Progress Notes (Signed)
OFFICE PROGRESS NOTE  Interval history:  [name] [name] is a 67 year old woman with long-standing metastatic breast cancer. She has declined further treatment and is followed on a supportive care approach. She is seen today for scheduled followup.  She reports that she feels well. Appetite is better. Her main complaint is related to "sinus drainage and a cough" for the past 5-6 days. She has mild stable shortness of breath. She denies fever. She reports stable mid to upper back pain. She takes oral morphine as needed. She thinks the left chest wall mass is better. She does however note drainage from the lesion. She is trying to keep it clean and dry. She notes decreased swelling of the left arm. Bowels are moving. She has a hemorrhoid and plans to obtain Preparation H today. She denies any unusual headaches or vision change. No falls or balance problems. No skin rash.   Objective: Blood pressure 135/81, pulse 84, temperature 97.5 F (36.4 C), temperature source Oral, resp. rate 18, height 5\' 8"  (1.727 m), weight 164 lb 6.4 oz (74.571 kg).  Oropharynx is without thrush or ulceration. No palpable cervical or left supraclavicular lymph nodes. Question small right supraclavicular lymph node. No palpable axillary lymph nodes. Lungs are clear. Diminished breath sounds at the left base. Regular cardiac rhythm. Port-A-Cath site is without erythema. Abdomen is soft and nontender. No hepatomegaly. No leg edema. Left arm is mildly edematous. Venous engorgement across the anterior left chest. Status post left mastectomy. Approximate 3 x 3 cm area of induration with superficial ulceration at the mid aspect of the left anterior chest wall. Large external hemorrhoid. Does not appear thrombosed. She is alert and oriented.  Lab Results: Lab Results  Component Value Date   WBC 8.3 03/25/2013   HGB 9.8* 03/25/2013   HCT 30.7* 03/25/2013   MCV 85.3 03/25/2013   PLT 314 03/25/2013    Chemistry:    Chemistry       Component Value Date/Time   NA 138 02/28/2013 0440   NA 139 02/23/2013 1110   K 4.0 02/28/2013 0440   K 3.6 02/23/2013 1110   CL 104 02/28/2013 0440   CL 107 10/14/2012 1047   CO2 25 02/28/2013 0440   CO2 27 02/23/2013 1110   BUN 8 02/28/2013 0440   BUN 9.5 02/23/2013 1110   CREATININE 0.81 02/28/2013 0440   CREATININE 0.8 02/23/2013 1110   CREATININE 0.89 10/23/2010 1514      Component Value Date/Time   CALCIUM 8.7 02/28/2013 0440   CALCIUM 9.1 02/23/2013 1110   ALKPHOS 56 02/26/2013 1706   ALKPHOS 65 02/23/2013 1110   AST 13 02/26/2013 1706   AST 12 02/23/2013 1110   ALT 8 02/26/2013 1706   ALT 12 02/23/2013 1110   BILITOT 0.3 02/26/2013 1706   BILITOT 0.34 02/23/2013 1110       Studies/Results: Ct Angio Chest Pe W/cm &/or Wo Cm  02/26/2013   **ADDENDUM** CREATED: 02/26/2013 22:06:41  1.  Positive for segmental sized pulmonary embolism to the right lower lobe, nonobstructive. This area is obscured by respiratory motion on chest CTA, but the embolism is clearly seen on abdominal imaging reformats. 2.  Addendum discussed with PA Heather via telephone at 22:05 on 02/26/2013.  **END ADDENDUM** SIGNED BY: Audry Riles  02/26/2013   *RADIOLOGY REPORT*  Clinical Data:  Upper abdominal pain.  Nausea and constipation. History of breast cancer, no longer under treatment.  CT ANGIOGRAPHY CHEST CT ABDOMEN AND PELVIS WITH CONTRAST  Technique:  Multidetector CT imaging of the chest was performed using the standard protocol during bolus administration of intravenous contrast.  Multiplanar CT image reconstructions including MIPs were obtained to evaluate the vascular anatomy. Multidetector CT imaging of the abdomen and pelvis was performed using the standard protocol during bolus administration of intravenous contrast.  Contrast: OMNIPAQUE IOHEXOL 350 MG/ML SOLN  Comparison:  Chest CT  01/19/2012.  Abdomen pelvis CT 10/09/2009.  CTA CHEST  Findings:  THORACIC INLET/BODY WALL:  Unremarkable appearance of right IJ  portacatheter.  Status post left mastectomy with axillary dissection.  Left chest wall subcutaneous nodule enlarged from prior, currently 2.8 x 1.6 cm.  Abnormally lobulated lymph nodes present in the right axilla, measuring up to 2 cm.  These are mildly increased from 2013.  MEDIASTINUM:  Bulky pleural metastatic disease in the upper left chest has progressed since prior, with mass until measuring up to 7 x 7 x 10 cm.  There is extensive contact with the left subclavian artery and the lateral aortic arch. There is effacement of the left brachycephalic vein.The left IMA is partly encased.  Nondominant left vertebral artery is stenotic near its origin, likely secondary to atherosclerosis, unchanged from prior.  No pulmonary embolism detected.  No cardiomegaly or pericardial effusion.  Coronary artery atherosclerosis.  LUNG WINDOWS:  No consolidation.  Minimal patchy opacity in the right upper lobe, nonspecific.  Progressive pleural metastatic disease in the left chest, with bulky disease in the upper pleural space, as described above.  The costophrenic sulci contained extensive tumor, with erosion into the bony thorax, particularly the ninth rib.  Extensive diaphragmatic involvement, likely with ingrowth, now affecting the spleen.  Left diaphragmatic elevation, likely related to phrenic nerve involvement by tumor.  OSSEOUS:  Direct invasion of the posterior left 9th rib, as above.  As recently demonstrated by MRI, there are pathologic compression fractures of T3 and T4, fixated with posterior rod and screw fixation spanning from T1-T6.  Mild lucencies around the T6 pedicle screws are not changed from prior.  Additional smaller metastatic deposits as seen on previous MRI, including within the spinous process of T8.  Status post T3 laminectomies.   Review of the MIP images confirms the above findings.  IMPRESSION:  1.  No pulmonary embolism detected. 2.  Progressive thoracic (osseous, left pleural, and left chest wall)  metastatic disease compared to 01/19/2012.  3. Elevate, likely paretic left diaphragm.  CT ABDOMEN AND PELVIS  Liver: Ill-defined hypoenhancing lesion in low segment six, measuring 1 centimeter, unchanged from 2011.  Biliary: No evidence of biliary obstruction or stone.  Pancreas: Unremarkable.  Spleen: Scalloping of the upper and posterior surface of the spleen related to tumor growth through the diaphragm.  Adrenals: Unremarkable.  Kidneys and ureters: Numerous hypoenhancing lesions throughout the bilateral kidneys, of varying density.  The most suspicious is in the upper pole left kidney, 15 mm in diameter.  Renal lesions are unlikely to be significant given the extensive metastatic disease.  Bladder: Unremarkable.  Bowel: No obstruction. No pericecal inflammatory changes.  Retroperitoneum: Nodules at the aortic hiatus, compatible with nodal metastatic spread, measuring up to 15 mm on the right.  There borderline enlarged periaortic lymph nodes - not definitely malignant.  Peritoneum: No free fluid or gas.  Reproductive: Hysterectomy.  Vascular: Extensive atherosclerotic calcification of the aortic branch vessels.  OSSEOUS: Sclerotic lesion in the right L2 body, left L3 body, S1 body, and right sacral ala are consistent with metastatic deposits. No pathologic fracture. No suspicious  lytic or blastic lesions.  Review of the MIP images confirms the above findings.  IMPRESSION:   No acute intra-abdominal findings.   Original Report Authenticated By: Tiburcio Pea   Ct Abdomen Pelvis W Contrast  02/26/2013   **ADDENDUM** CREATED: 02/26/2013 22:06:41  1.  Positive for segmental sized pulmonary embolism to the right lower lobe, nonobstructive. This area is obscured by respiratory motion on chest CTA, but the embolism is clearly seen on abdominal imaging reformats. 2.  Addendum discussed with PA Heather via telephone at 22:05 on 02/26/2013.  **END ADDENDUM** SIGNED BY: Audry Riles  02/26/2013   *RADIOLOGY  REPORT*  Clinical Data:  Upper abdominal pain.  Nausea and constipation. History of breast cancer, no longer under treatment.  CT ANGIOGRAPHY CHEST CT ABDOMEN AND PELVIS WITH CONTRAST  Technique:  Multidetector CT imaging of the chest was performed using the standard protocol during bolus administration of intravenous contrast.  Multiplanar CT image reconstructions including MIPs were obtained to evaluate the vascular anatomy. Multidetector CT imaging of the abdomen and pelvis was performed using the standard protocol during bolus administration of intravenous contrast.  Contrast: OMNIPAQUE IOHEXOL 350 MG/ML SOLN  Comparison:  Chest CT  01/19/2012.  Abdomen pelvis CT 10/09/2009.  CTA CHEST  Findings:  THORACIC INLET/BODY WALL:  Unremarkable appearance of right IJ portacatheter.  Status post left mastectomy with axillary dissection.  Left chest wall subcutaneous nodule enlarged from prior, currently 2.8 x 1.6 cm.  Abnormally lobulated lymph nodes present in the right axilla, measuring up to 2 cm.  These are mildly increased from 2013.  MEDIASTINUM:  Bulky pleural metastatic disease in the upper left chest has progressed since prior, with mass until measuring up to 7 x 7 x 10 cm.  There is extensive contact with the left subclavian artery and the lateral aortic arch. There is effacement of the left brachycephalic vein.The left IMA is partly encased.  Nondominant left vertebral artery is stenotic near its origin, likely secondary to atherosclerosis, unchanged from prior.  No pulmonary embolism detected.  No cardiomegaly or pericardial effusion.  Coronary artery atherosclerosis.  LUNG WINDOWS:  No consolidation.  Minimal patchy opacity in the right upper lobe, nonspecific.  Progressive pleural metastatic disease in the left chest, with bulky disease in the upper pleural space, as described above.  The costophrenic sulci contained extensive tumor, with erosion into the bony thorax, particularly the ninth rib.   Extensive diaphragmatic involvement, likely with ingrowth, now affecting the spleen.  Left diaphragmatic elevation, likely related to phrenic nerve involvement by tumor.  OSSEOUS:  Direct invasion of the posterior left 9th rib, as above.  As recently demonstrated by MRI, there are pathologic compression fractures of T3 and T4, fixated with posterior rod and screw fixation spanning from T1-T6.  Mild lucencies around the T6 pedicle screws are not changed from prior.  Additional smaller metastatic deposits as seen on previous MRI, including within the spinous process of T8.  Status post T3 laminectomies.   Review of the MIP images confirms the above findings.  IMPRESSION:  1.  No pulmonary embolism detected. 2.  Progressive thoracic (osseous, left pleural, and left chest wall) metastatic disease compared to 01/19/2012.  3. Elevate, likely paretic left diaphragm.  CT ABDOMEN AND PELVIS  Liver: Ill-defined hypoenhancing lesion in low segment six, measuring 1 centimeter, unchanged from 2011.  Biliary: No evidence of biliary obstruction or stone.  Pancreas: Unremarkable.  Spleen: Scalloping of the upper and posterior surface of the spleen related to tumor  growth through the diaphragm.  Adrenals: Unremarkable.  Kidneys and ureters: Numerous hypoenhancing lesions throughout the bilateral kidneys, of varying density.  The most suspicious is in the upper pole left kidney, 15 mm in diameter.  Renal lesions are unlikely to be significant given the extensive metastatic disease.  Bladder: Unremarkable.  Bowel: No obstruction. No pericecal inflammatory changes.  Retroperitoneum: Nodules at the aortic hiatus, compatible with nodal metastatic spread, measuring up to 15 mm on the right.  There borderline enlarged periaortic lymph nodes - not definitely malignant.  Peritoneum: No free fluid or gas.  Reproductive: Hysterectomy.  Vascular: Extensive atherosclerotic calcification of the aortic branch vessels.  OSSEOUS: Sclerotic lesion in  the right L2 body, left L3 body, S1 body, and right sacral ala are consistent with metastatic deposits. No pathologic fracture. No suspicious lytic or blastic lesions.  Review of the MIP images confirms the above findings.  IMPRESSION:   No acute intra-abdominal findings.   Original Report Authenticated By: Tiburcio Pea   Dg Abd Acute W/chest  02/26/2013   *RADIOLOGY REPORT*  Clinical Data: Abdominal pain and history of metastatic breast carcinoma.  ACUTE ABDOMEN SERIES (ABDOMEN 2 VIEW & CHEST 1 VIEW)  Comparison: 02/09/2013  Findings: Stable mediastinal prominence in the chest with soft tissue mass protruding into the left upper lung.  Stable positioning of Port-A-Cath.  Bibasilar scarring and atelectasis present.  Abdominal films show no evidence of acute bowel obstruction or free intraperitoneal air.  No significant ileus is identified.  No abnormal calcifications or soft tissue abnormalities are seen. Visualized bony structures are unremarkable.  IMPRESSION: No acute findings.   Original Report Authenticated By: Irish Lack, M.D.    Medications: I have reviewed the patient's current medications.  Assessment/Plan:  1.Metastatic breast cancer - initially diagnosed with breast cancer in 1996 status post left mastectomy and axillary lymph node dissection. She developed a chest wall recurrence in 2001 and completed radiation. In April 2003, CT showed mediastinal, internal mammary, and right axillary adenopathy and two liver lesions. MRI of the thoracic spine June 18, 2002 revealed osseous abnormalities at T3 and T4 suspicious for metastatic disease with extension into the T3 pedicle. There was extra osseous extension compressing and displacing the thoracic cord. She completed radiation from T1 through T6 June 21, 2002 through July 13, 2002. CT scan Nov 11, 2002 showed progression of metastatic disease with precardiac nodes, left pleural and lower lobe disease, subcutaneous nodules at the  left anterior chest wall and progressive metastatic disease involving the liver. She began Femara in May/early June 2004 with subsequent resolution of chest wall nodules. CT scans January 14, 2005, of the chest and abdomen revealed findings of decreased pleural-based metastatic disease in the lower left hemithorax and no new or progressive disease in the chest. Small lesion in the right liver measuring 13.0 x 10.0 mm was reported as stable when compared to her previous study. No other liver lesions were seen. Bone scan January 22, 2006 showed degenerative changes. She discontinued Femara February 2009 and was lost a followup. She presented in March 2010 with an ulcerated chest wall lesion and an elevated CA27.29. Restaging CTs September 18, 2008 showed a 1.3 x 2.2 cm subdermal lesion at the left anterior chest, multiple enlarged metastases in the left hemithorax at the mediastinum and pleura felt to represent necrotic lymph nodes versus pleural metastases. Right axillary lymph node was decreased in size compared to 2006. There were no suspicious bone lesions. A previously noted lesion in the inferior right  liver was not clearly seen. She resumed Femara March 2010. On October 23, 2008 the chest wall lesion had improved. The CA27.29 on October 23, 2008 was stable at 60. She discontinued Femara in June 2010 and CTs of the abdomen and pelvis April 06, 2009 showed slight decrease in the size of a pleural-based nodule in the left lower lobe. There was no other evidence for metastatic disease in the abdomen or pelvis. She resumed Femara on April 11, 2009. CA27.29 returned at 87 on April 11, 2009. CT of the abdomen and pelvis on May 30, 2009 showed an increase in multiple pericardial masses, increased left chest wall subcutaneous nodule, stable pleural-based metastases and stable liver lesions. She discontinued Femara in November 2010 due to nausea and vomiting. Femara was resumed following an office visit June 06, 2009. Dr.  Myrle Sheng reviewed a CT from May 30, 2009 with a [name] radiologist comparing the scan to scans from March 2010 and October 2010. In addition, a CT scan from 2006 was reviewed. The pleural-based lesions in the left chest had not changed significantly since March 2010. The pericardial masses appeared slightly larger. Both the pericardial masses and pleural-based masses were larger compared to the CT from 2006. She discontinued Femara January 2011. The left chest wall nodule recurred during the time she was off of Femara. Femara was resumed following an office visit 10/02/2009. The nodule again improved. Restaging CT of the chest 08/16/2010 was stable compared to a CT from 03/29/2010. Most recent CA 27-29 on 01/16/2012 was stable at 340. Femara was continued. CT the chest 02/23/2012 with progressive pleural-based tumor in the left chest with probable involvement of T3. Femara was discontinued. MRI of the thoracic spine on 03/05/2012 showed severe T3 and T4 metastatic disease with mild pathologic compression fractures and epidural extension causing some cord compression at T3. There was no definite cord edema. There was associated extensive bilateral neural foraminal involvement at the T2 and T3 nerve levels greater on the left. There was associated very bulky mediastinal and pleural metastatic disease. She underwent a laminectomy procedure on 04/01/2012 with resection of epidural metastatic tumor. 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 The tumor was ER positive 100%; PR positive 53%; HER-2/neu by CISH showed amplification. She declined a trial of chemotherapy and Herceptin. She began Faslodex on 07/12/2012. The CA 27-29 tumor marker was further increased on 10/14/2012. MRI thoracic spine 12/26/2012 showed progressive metastatic disease to the spine with increased pathologic fractures at T3 and T4. Despite the previous T3 laminectomies there was early cord compression at T3  and right-sided neural foraminal extension of epidural tumor at T2-T3. Bulky extra osseous metastatic disease was again demonstrated within the mediastinum and left pleural space. She began treatment with Taxol/Herceptin/Pertuzumab with the Taxol given on a day 1/day 8 schedule and the Herceptin/Pertuzumab given day 1 only of a 21 day cycle on 12/29/2012.  2. Ulcerated left chest lesion, likely a metastatic lesion. The area of induration appears larger on exam today. 3. Severe right low back/buttock pain radiating to the left leg October 2010 with MRI on April 06, 2009 revealing a disk protrusion at L5-S1 displacing the right S1 nerve root. She was evaluated by Dr. Jordan Likes. The pain has resolved.  4. Hospitalization December 1 through May 31, 2009 with nausea, vomiting and abdominal pain - resolved.  5. 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  6. Pain at the right back/ iliac region  October 25, 2008.  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  8. Status post left knee replacement.  9. Chronic edema of the left lower leg.  10. "Migraines". She takes Imitrex as needed.  11. Falls and balance problems occurring over the past year. She has been referred to neurology. No difficulty with ambulation today.  12. Right breast with area of firmness on exam March 2013 -negative right breast mammogram and ultrasound 09/15/2011. Negative right breast mammogram 09/16/2012.  13. Pain at upper back. Resolved following the laminectomy procedure. The pain increased. CT and MRI confirmed progressive disease. Stable. She continues morphine as needed.  14. Unsteady gait on 04/14/2012-much improved.  15. Nausea/vomiting following Faslodex 07/26/2012.  16. Weight loss-likely secondary to progression of the metastatic breast cancer.  17. Persistent nausea and diarrhea following initiation of Taxol/Herceptin/pertuzumab 12/29/2012. The nausea appears to  be related to a brain metastasis detected on a CT 01/19/2013  18. Admission with severe anemia and a gastric ulcer 02/09/2013, biopsy positive for H. Pylori. Hemoglobin is stable to improved.  19. Left upper extremity DVT documented during the 02/09/2013 hospital admission-not treated.  Disposition-she appears stable overall. She continues to decline further treatment for breast cancer. We discussed a hospice referral. She is agreeable to this. We will contact the [facility] hospice program.  Port-A-Cath will be flushed today.  She will return for a followup visit in 4 weeks.  Plan reviewed with Dr. Truett Perna.   Lonna Cobb ANP/GNP-BC

## 2013-03-25 NOTE — Progress Notes (Signed)
Call from [place] w/[facility]: Patient declines Hospice services because she would have to discontinue [facility] (her aid).

## 2013-03-28 ENCOUNTER — Telehealth: Payer: Self-pay | Admitting: *Deleted

## 2013-03-28 DIAGNOSIS — C50912 Malignant neoplasm of unspecified site of left female breast: Secondary | ICD-10-CM

## 2013-03-28 NOTE — Telephone Encounter (Signed)
Calls from pt and [facility] requesting referral so pt may continue to receive supplies from [facility]. Insurance company requires this. Order faxed over for referral and products, per Dr. Truett Perna.

## 2013-03-29 ENCOUNTER — Encounter: Payer: Medicare HMO | Admitting: Internal Medicine

## 2013-04-21 ENCOUNTER — Telehealth: Payer: Self-pay | Admitting: Oncology

## 2013-04-21 ENCOUNTER — Ambulatory Visit (HOSPITAL_BASED_OUTPATIENT_CLINIC_OR_DEPARTMENT_OTHER): Payer: Medicare HMO | Admitting: Oncology

## 2013-04-21 ENCOUNTER — Other Ambulatory Visit (HOSPITAL_BASED_OUTPATIENT_CLINIC_OR_DEPARTMENT_OTHER): Payer: Commercial Managed Care - HMO | Admitting: Lab

## 2013-04-21 VITALS — BP 153/82 | HR 80 | Temp 98.2°F | Resp 19 | Ht 68.0 in | Wt 167.8 lb

## 2013-04-21 DIAGNOSIS — M549 Dorsalgia, unspecified: Secondary | ICD-10-CM

## 2013-04-21 DIAGNOSIS — C7931 Secondary malignant neoplasm of brain: Secondary | ICD-10-CM

## 2013-04-21 DIAGNOSIS — C50919 Malignant neoplasm of unspecified site of unspecified female breast: Secondary | ICD-10-CM

## 2013-04-21 DIAGNOSIS — C787 Secondary malignant neoplasm of liver and intrahepatic bile duct: Secondary | ICD-10-CM

## 2013-04-21 DIAGNOSIS — G893 Neoplasm related pain (acute) (chronic): Secondary | ICD-10-CM

## 2013-04-21 DIAGNOSIS — C7951 Secondary malignant neoplasm of bone: Secondary | ICD-10-CM

## 2013-04-21 DIAGNOSIS — Z95828 Presence of other vascular implants and grafts: Secondary | ICD-10-CM

## 2013-04-21 LAB — CBC WITH DIFFERENTIAL/PLATELET
Basophils Absolute: 0 10*3/uL (ref 0.0–0.1)
Eosinophils Absolute: 0.1 10*3/uL (ref 0.0–0.5)
HGB: 9.4 g/dL — ABNORMAL LOW (ref 11.6–15.9)
LYMPH%: 28.2 % (ref 14.0–49.7)
MCV: 86.1 fL (ref 79.5–101.0)
MONO%: 11.4 % (ref 0.0–14.0)
NEUT#: 3.3 10*3/uL (ref 1.5–6.5)
Platelets: 232 10*3/uL (ref 145–400)

## 2013-04-21 MED ORDER — SODIUM CHLORIDE 0.9 % IJ SOLN
10.0000 mL | INTRAMUSCULAR | Status: DC | PRN
  Administered 2013-04-21: 10 mL via INTRAVENOUS
  Filled 2013-04-21: qty 10

## 2013-04-21 MED ORDER — HEPARIN SOD (PORK) LOCK FLUSH 100 UNIT/ML IV SOLN
500.0000 [IU] | Freq: Once | INTRAVENOUS | Status: AC
  Administered 2013-04-21: 500 [IU] via INTRAVENOUS
  Filled 2013-04-21: qty 5

## 2013-04-21 MED ORDER — MORPHINE SULFATE 15 MG PO TABS: 15.0000 mg | ORAL_TABLET | ORAL | Status: DC | PRN

## 2013-04-21 NOTE — Telephone Encounter (Signed)
gv and printed appt sched and avs for pt for NOV °

## 2013-04-21 NOTE — Progress Notes (Signed)
[facility]    OFFICE PROGRESS NOTE   INTERVAL HISTORY:   She returns for scheduled visit. The upper back pain is controlled with MS IR. No new complaint. She feels the chest wall lesion is healing.  Objective:  Vital signs in last 24 hours:  Blood pressure 153/82, pulse 80, temperature 98.2 F (36.8 C), temperature source Oral, resp. rate 19, height 5\' 8"  (1.727 m), weight 167 lb 12.8 oz (76.114 kg).    HEENT: Neck without mass Lymphatics: No cervical, supraclavicular, or axillary nodes Resp: Lungs with decreased breath sounds at the left upper posterior chest, no respiratory distress Cardio: Regular rate and rhythm GI: No hepatomegaly Vascular: No leg edema , no left arm edema Skin: Status post left mastectomy. 1-2 cm open area at the left anterior chest wall with surrounding induration/nodularity   Portacath/PICC-without erythema  Lab Results:  Lab Results  Component Value Date   WBC 5.6 04/21/2013   HGB 9.4* 04/21/2013   HCT 29.7* 04/21/2013   MCV 86.1 04/21/2013   PLT 232 04/21/2013   ANC 3.3    Medications: I have reviewed the patient's current medications.  Assessment/Plan: 1.Metastatic breast cancer - initially diagnosed with breast cancer in 1996 status post left mastectomy and axillary lymph node dissection. She developed a chest wall recurrence in 2001 and completed radiation. In April 2003, CT showed mediastinal, internal mammary, and right axillary adenopathy and two liver lesions. MRI of the thoracic spine June 18, 2002 revealed osseous abnormalities at T3 and T4 suspicious for metastatic disease with extension into the T3 pedicle. There was extra osseous extension compressing and displacing the thoracic cord. She completed radiation from T1 through T6 June 21, 2002 through July 13, 2002. CT scan Nov 11, 2002 showed progression of metastatic disease with precardiac nodes, left pleural and lower lobe disease, subcutaneous nodules  at the left anterior chest wall and progressive metastatic disease involving the liver. She began Femara in May/early June 2004 with subsequent resolution of chest wall nodules. CT scans January 14, 2005, of the chest and abdomen revealed findings of decreased pleural-based metastatic disease in the lower left hemithorax and no new or progressive disease in the chest. Small lesion in the right liver measuring 13.0 x 10.0 mm was reported as stable when compared to her previous study. No other liver lesions were seen. Bone scan January 22, 2006 showed degenerative changes. She discontinued Femara February 2009 and was lost a followup. She presented in March 2010 with an ulcerated chest wall lesion and an elevated CA27.29. Restaging CTs September 18, 2008 showed a 1.3 x 2.2 cm subdermal lesion at the left anterior chest, multiple enlarged metastases in the left hemithorax at the mediastinum and pleura felt to represent necrotic lymph nodes versus pleural metastases. Right axillary lymph node was decreased in size compared to 2006. There were no suspicious bone lesions. A previously noted lesion in the inferior right liver was not clearly seen. She resumed Femara March 2010. On October 23, 2008 the chest wall lesion had improved. The CA27.29 on October 23, 2008 was stable at 60. She discontinued Femara in June 2010 and CTs of the abdomen and pelvis April 06, 2009 showed slight decrease in the size of a pleural-based nodule in the left lower lobe. There was no other evidence for metastatic disease in the abdomen or pelvis. She resumed Femara on April 11, 2009. CA27.29 returned at 87 on April 11, 2009. CT of the abdomen and pelvis on May 30, 2009  showed an increase in multiple pericardial masses, increased left chest wall subcutaneous nodule, stable pleural-based metastases and stable liver lesions. She discontinued Femara in November 2010 due to nausea and vomiting. Femara was resumed following an office visit June 06, 2009. Dr. Myrle Sheng reviewed a CT from May 30, 2009 with a [name] radiologist comparing the scan to scans from March 2010 and October 2010. In addition, a CT scan from 2006 was reviewed. The pleural-based lesions in the left chest had not changed significantly since March 2010. The pericardial masses appeared slightly larger. Both the pericardial masses and pleural-based masses were larger compared to the CT from 2006. She discontinued Femara January 2011. The left chest wall nodule recurred during the time she was off of Femara. Femara was resumed following an office visit 10/02/2009. The nodule again improved. Restaging CT of the chest 08/16/2010 was stable compared to a CT from 03/29/2010. Most recent CA 27-29 on 01/16/2012 was stable at 340. Femara was continued. CT the chest 02/23/2012 with progressive pleural-based tumor in the left chest with probable involvement of T3. Femara was discontinued. MRI of the thoracic spine on 03/05/2012 showed severe T3 and T4 metastatic disease with mild pathologic compression fractures and epidural extension causing some cord compression at T3. There was no definite cord edema. There was associated extensive bilateral neural foraminal involvement at the T2 and T3 nerve levels greater on the left. There was associated very bulky mediastinal and pleural metastatic disease. She underwent a laminectomy procedure on 04/01/2012 with resection of epidural metastatic tumor. 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 The tumor was ER positive 100%; PR positive 53%; HER-2/neu by CISH showed amplification. She declined a trial of chemotherapy and Herceptin. She began Faslodex on 07/12/2012. The CA 27-29 tumor marker was further increased on 10/14/2012. MRI thoracic spine 12/26/2012 showed progressive metastatic disease to the spine with increased pathologic fractures at T3 and T4. Despite the previous T3 laminectomies there was early cord  compression at T3 and right-sided neural foraminal extension of epidural tumor at T2-T3. Bulky extra osseous metastatic disease was again demonstrated within the mediastinum and left pleural space. She began treatment with Taxol/Herceptin/Pertuzumab with the Taxol given on a day 1/day 8 schedule and the Herceptin/Pertuzumab given day 1 only of a 21 day cycle on 12/29/2012.  2. Ulcerated left chest lesion, likely a metastatic lesion. The area of induration appears stable today. 3. Severe right low back/buttock pain radiating to the left leg October 2010 with MRI on April 06, 2009 revealing a disk protrusion at L5-S1 displacing the right S1 nerve root. She was evaluated by Dr. Jordan Likes. The pain has resolved.  4. Hospitalization December 1 through May 31, 2009 with nausea, vomiting and abdominal pain - resolved.  5. 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  6. Pain at the right back/ iliac region October 25, 2008.  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  8. Status post left knee replacement.  9. Chronic edema of the left lower leg.  10. "Migraines". She takes Imitrex as needed.  11. Falls and balance problems occurring over the past year. She was referred to neurology. No difficulty with ambulation today.  12. Right breast with area of firmness on exam March 2013 -negative right breast mammogram and ultrasound 09/15/2011. Negative right breast mammogram 09/16/2012.  13. Pain at upper back. Resolved following the laminectomy procedure. The pain increased. CT and MRI confirmed progressive disease.  Stable. She continues morphine as needed.  14. Unsteady gait on 04/14/2012-much improved.  15. Nausea/vomiting following Faslodex 07/26/2012.  16. History of Weight loss-likely secondary to progression of the metastatic breast cancer.  17. Persistent nausea and diarrhea following initiation of Taxol/Herceptin/pertuzumab 12/29/2012. 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 Admission with severe anemia and a gastric ulcer 02/09/2013, biopsy positive for H. Pylori. Hemoglobin is stable to improved.  19. Left upper extremity DVT documented during the 02/09/2013 hospital admission-not treated.  20. Status post SRS to a left parietal brain lesion on 02/03/2013   Disposition:  She appears stable. She again stated that she will not agree to further systemic therapy. Ms. Hendel will continue MSIR as needed for pain. The Port-A-Cath was flushed today. She declined an influenza vaccine. She will return for an office visit and Port-A-Cath flush in 5-6 weeks.   Thornton Papas, MD  04/21/2013  4:27 PM

## 2013-05-24 ENCOUNTER — Ambulatory Visit (HOSPITAL_BASED_OUTPATIENT_CLINIC_OR_DEPARTMENT_OTHER): Payer: Medicare HMO | Admitting: Nurse Practitioner

## 2013-05-24 ENCOUNTER — Telehealth: Payer: Self-pay | Admitting: Oncology

## 2013-05-24 ENCOUNTER — Other Ambulatory Visit (HOSPITAL_BASED_OUTPATIENT_CLINIC_OR_DEPARTMENT_OTHER): Payer: Commercial Managed Care - HMO | Admitting: Lab

## 2013-05-24 ENCOUNTER — Ambulatory Visit (HOSPITAL_BASED_OUTPATIENT_CLINIC_OR_DEPARTMENT_OTHER): Payer: Medicare HMO

## 2013-05-24 ENCOUNTER — Encounter (INDEPENDENT_AMBULATORY_CARE_PROVIDER_SITE_OTHER): Payer: Self-pay

## 2013-05-24 VITALS — BP 156/81 | HR 72 | Temp 98.0°F | Resp 20 | Ht 68.0 in | Wt 169.8 lb

## 2013-05-24 DIAGNOSIS — M549 Dorsalgia, unspecified: Secondary | ICD-10-CM

## 2013-05-24 DIAGNOSIS — C50919 Malignant neoplasm of unspecified site of unspecified female breast: Secondary | ICD-10-CM

## 2013-05-24 DIAGNOSIS — C7951 Secondary malignant neoplasm of bone: Secondary | ICD-10-CM

## 2013-05-24 DIAGNOSIS — R978 Other abnormal tumor markers: Secondary | ICD-10-CM

## 2013-05-24 DIAGNOSIS — C792 Secondary malignant neoplasm of skin: Secondary | ICD-10-CM

## 2013-05-24 DIAGNOSIS — C800 Disseminated malignant neoplasm, unspecified: Secondary | ICD-10-CM

## 2013-05-24 DIAGNOSIS — C7931 Secondary malignant neoplasm of brain: Secondary | ICD-10-CM

## 2013-05-24 LAB — CBC WITH DIFFERENTIAL/PLATELET
BASO%: 0.6 % (ref 0.0–2.0)
HCT: 31.4 % — ABNORMAL LOW (ref 34.8–46.6)
LYMPH%: 28.1 % (ref 14.0–49.7)
MCH: 27.2 pg (ref 25.1–34.0)
MCHC: 31.7 g/dL (ref 31.5–36.0)
MONO#: 0.6 10*3/uL (ref 0.1–0.9)
NEUT%: 56.5 % (ref 38.4–76.8)
Platelets: 239 10*3/uL (ref 145–400)
WBC: 4.5 10*3/uL (ref 3.9–10.3)

## 2013-05-24 MED ORDER — HEPARIN SOD (PORK) LOCK FLUSH 100 UNIT/ML IV SOLN
500.0000 [IU] | Freq: Once | INTRAVENOUS | Status: AC
  Administered 2013-05-24: 500 [IU] via INTRAVENOUS
  Filled 2013-05-24: qty 5

## 2013-05-24 MED ORDER — SODIUM CHLORIDE 0.9 % IJ SOLN
10.0000 mL | INTRAMUSCULAR | Status: DC | PRN
  Administered 2013-05-24: 10 mL via INTRAVENOUS
  Filled 2013-05-24: qty 10

## 2013-05-24 MED ORDER — MORPHINE SULFATE 15 MG PO TABS: 15.0000 mg | ORAL_TABLET | ORAL | Status: DC | PRN

## 2013-05-24 NOTE — Patient Instructions (Signed)
Implanted Port Instructions  An implanted port is a central line that has a round shape and is placed under the skin. It is used for long-term IV (intravenous) access for:  · Medicine.  · Fluids.  · Liquid nutrition, such as TPN (total parenteral nutrition).  · Blood samples.  Ports can be placed:  · In the chest area just below the collarbone (this is the most common place.)  · In the arms.  · In the belly (abdomen) area.  · In the legs.  PARTS OF THE PORT  A port has 2 main parts:  · The reservoir. The reservoir is round, disc-shaped, and will be a small, raised area under your skin.  · The reservoir is the part where a needle is inserted (accessed) to either give medicines or to draw blood.  · The catheter. The catheter is a long, slender tube that extends from the reservoir. The catheter is placed into a large vein.  · Medicine that is inserted into the reservoir goes into the catheter and then into the vein.  INSERTION OF THE PORT  · The port is surgically placed in either an operating room or in a procedural area (interventional radiology).  · Medicine may be given to help you relax during the procedure.  · The skin where the port will be inserted is numbed (local anesthetic).  · 1 or 2 small cuts (incisions) will be made in the skin to insert the port.  · The port can be used after it has been inserted.  INCISION SITE CARE  · The incision site may have small adhesive strips on it. This helps keep the incision site closed. Sometimes, no adhesive strips are placed. Instead of adhesive strips, a special kind of surgical glue is used to keep the incision closed.  · If adhesive strips were placed on the incision sites, do not take them off. They will fall off on their own.  · The incision site may be sore for 1 to 2 days. Pain medicine can help.  · Do not get the incision site wet. Bathe or shower as directed by your caregiver.  · The incision site should heal in 5 to 7 days. A small scar may form after the  incision has healed.  ACCESSING THE PORT  Special steps must be taken to access the port:  · Before the port is accessed, a numbing cream can be placed on the skin. This helps numb the skin over the port site.  · A sterile technique is used to access the port.  · The port is accessed with a needle. Only "non-coring" port needles should be used to access the port. Once the port is accessed, a blood return should be checked. This helps ensure the port is in the vein and is not clogged (clotted).  · If your caregiver believes your port should remain accessed, a clear (transparent) bandage will be placed over the needle site. The bandage and needle will need to be changed every week or as directed by your caregiver.  · Keep the bandage covering the needle clean and dry. Do not get it wet. Follow your caregiver's instructions on how to take a shower or bath when the port is accessed.  · If your port does not need to stay accessed, no bandage is needed over the port.  FLUSHING THE PORT  Flushing the port keeps it from getting clogged. How often the port is flushed depends on:  · If a   constant infusion is running. If a constant infusion is running, the port may not need to be flushed.  · If intermittent medicines are given.  · If the port is not being used.  For intermittent medicines:  · The port will need to be flushed:  · After medicines have been given.  · After blood has been drawn.  · As part of routine maintenance.  · A port is normally flushed with:  · Normal saline.  · Heparin.  · Follow your caregiver's advice on how often, how much, and the type of flush to use on your port.  IMPORTANT PORT INFORMATION  · Tell your caregiver if you are allergic to heparin.  · After your port is placed, you will get a manufacturer's information card. The card has information about your port. Keep this card with you at all times.  · There are many types of ports available. Know what kind of port you have.  · In case of an  emergency, it may be helpful to wear a medical alert bracelet. This can help alert health care workers that you have a port.  · The port can stay in for as long as your caregiver believes it is necessary.  · When it is time for the port to come out, surgery will be done to remove it. The surgery will be similar to how the port was put in.  · If you are in the hospital or clinic:  · Your port will be taken care of and flushed by a nurse.  · If you are at home:  · A home health care nurse may give medicines and take care of the port.  · You or a family member can get special training and directions for giving medicine and taking care of the port at home.  SEEK IMMEDIATE MEDICAL CARE IF:   · Your port does not flush or you are unable to get a blood return.  · New drainage or pus is coming from the incision.  · A bad smell is coming from the incision site.  · You develop swelling or increased redness at the incision site.  · You develop increased swelling or pain at the port site.  · You develop swelling or pain in the surrounding skin near the port.  · You have an oral temperature above 102° F (38.9° C), not controlled by medicine.  MAKE SURE YOU:   · Understand these instructions.  · Will watch your condition.  · Will get help right away if you are not doing well or get worse.  Document Released: 06/16/2005 Document Revised: 09/08/2011 Document Reviewed: 09/07/2008  [employer]® Patient Information ©2014 [employer].

## 2013-05-24 NOTE — Progress Notes (Signed)
OFFICE PROGRESS NOTE  Interval history:  [name] [name] returns for followup of metastatic breast cancer. She has noted increased back pain over the past one week. She denies extremity weakness or numbness. No bowel or bladder dysfunction. She continues MSIR as needed. She is tolerating a regular diet. She denies nausea/vomiting. She reports gaining 2 pounds since her last visit.  She reports significant pruritus over the left chest wall.   Objective: Blood pressure 156/81, pulse 72, temperature 98 F (36.7 C), temperature source Oral, resp. rate 20, height 5\' 8"  (1.727 m), weight 169 lb 12.8 oz (77.021 kg).  Oropharynx is without thrush or ulceration. No palpable cervical, subclavicular or axillary lymph nodes. Lungs clear. Regular cardiac rhythm. Abdomen soft. No hepatomegaly. No leg edema. Status post left mastectomy. Approximate 1 cm open wound at the left anterior chest wall with surrounding induration measuring approximately 2 cm. Surrounding skin appears dry and thickened.  Lab Results: Lab Results  Component Value Date   WBC 4.5 05/24/2013   HGB 10.0* 05/24/2013   HCT 31.4* 05/24/2013   MCV 85.8 05/24/2013   PLT 239 05/24/2013    Chemistry:    Chemistry      Component Value Date/Time   NA 138 02/28/2013 0440   NA 139 02/23/2013 1110   K 4.0 02/28/2013 0440   K 3.6 02/23/2013 1110   CL 104 02/28/2013 0440   CL 107 10/14/2012 1047   CO2 25 02/28/2013 0440   CO2 27 02/23/2013 1110   BUN 8 02/28/2013 0440   BUN 9.5 02/23/2013 1110   CREATININE 0.81 02/28/2013 0440   CREATININE 0.8 02/23/2013 1110   CREATININE 0.89 10/23/2010 1514      Component Value Date/Time   CALCIUM 8.7 02/28/2013 0440   CALCIUM 9.1 02/23/2013 1110   ALKPHOS 56 02/26/2013 1706   ALKPHOS 65 02/23/2013 1110   AST 13 02/26/2013 1706   AST 12 02/23/2013 1110   ALT 8 02/26/2013 1706   ALT 12 02/23/2013 1110   BILITOT 0.3 02/26/2013 1706   BILITOT 0.34 02/23/2013 1110       Studies/Results: No results  found.  Medications: I have reviewed the patient's current medications.  Assessment/Plan:  1.Metastatic breast cancer - initially diagnosed with breast cancer in 1996 status post left mastectomy and axillary lymph node dissection. She developed a chest wall recurrence in 2001 and completed radiation. In April 2003, CT showed mediastinal, internal mammary, and right axillary adenopathy and two liver lesions. MRI of the thoracic spine June 18, 2002 revealed osseous abnormalities at T3 and T4 suspicious for metastatic disease with extension into the T3 pedicle. There was extra osseous extension compressing and displacing the thoracic cord. She completed radiation from T1 through T6 June 21, 2002 through July 13, 2002. CT scan Nov 11, 2002 showed progression of metastatic disease with precardiac nodes, left pleural and lower lobe disease, subcutaneous nodules at the left anterior chest wall and progressive metastatic disease involving the liver. She began Femara in May/early June 2004 with subsequent resolution of chest wall nodules. CT scans January 14, 2005, of the chest and abdomen revealed findings of decreased pleural-based metastatic disease in the lower left hemithorax and no new or progressive disease in the chest. Small lesion in the right liver measuring 13.0 x 10.0 mm was reported as stable when compared to her previous study. No other liver lesions were seen. Bone scan January 22, 2006 showed degenerative changes. She discontinued Femara February 2009 and was lost a followup. She presented in  March 2010 with an ulcerated chest wall lesion and an elevated CA27.29. Restaging CTs September 18, 2008 showed a 1.3 x 2.2 cm subdermal lesion at the left anterior chest, multiple enlarged metastases in the left hemithorax at the mediastinum and pleura felt to represent necrotic lymph nodes versus pleural metastases. Right axillary lymph node was decreased in size compared to 2006. There were no suspicious bone  lesions. A previously noted lesion in the inferior right liver was not clearly seen. She resumed Femara March 2010. On October 23, 2008 the chest wall lesion had improved. The CA27.29 on October 23, 2008 was stable at 60. She discontinued Femara in June 2010 and CTs of the abdomen and pelvis April 06, 2009 showed slight decrease in the size of a pleural-based nodule in the left lower lobe. There was no other evidence for metastatic disease in the abdomen or pelvis. She resumed Femara on April 11, 2009. CA27.29 returned at 87 on April 11, 2009. CT of the abdomen and pelvis on May 30, 2009 showed an increase in multiple pericardial masses, increased left chest wall subcutaneous nodule, stable pleural-based metastases and stable liver lesions. She discontinued Femara in November 2010 due to nausea and vomiting. Femara was resumed following an office visit June 06, 2009. Dr. Myrle Sheng reviewed a CT from May 30, 2009 with a [name] radiologist comparing the scan to scans from March 2010 and October 2010. In addition, a CT scan from 2006 was reviewed. The pleural-based lesions in the left chest had not changed significantly since March 2010. The pericardial masses appeared slightly larger. Both the pericardial masses and pleural-based masses were larger compared to the CT from 2006. She discontinued Femara January 2011. The left chest wall nodule recurred during the time she was off of Femara. Femara was resumed following an office visit 10/02/2009. The nodule again improved. Restaging CT of the chest 08/16/2010 was stable compared to a CT from 03/29/2010. Most recent CA 27-29 on 01/16/2012 was stable at 340. Femara was continued. CT the chest 02/23/2012 with progressive pleural-based tumor in the left chest with probable involvement of T3. Femara was discontinued. MRI of the thoracic spine on 03/05/2012 showed severe T3 and T4 metastatic disease with mild pathologic compression fractures and epidural  extension causing some cord compression at T3. There was no definite cord edema. There was associated extensive bilateral neural foraminal involvement at the T2 and T3 nerve levels greater on the left. There was associated very bulky mediastinal and pleural metastatic disease. She underwent a laminectomy procedure on 04/01/2012 with resection of epidural metastatic tumor. 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 The tumor was ER positive 100%; PR positive 53%; HER-2/neu by CISH showed amplification. She declined a trial of chemotherapy and Herceptin. She began Faslodex on 07/12/2012. The CA 27-29 tumor marker was further increased on 10/14/2012. MRI thoracic spine 12/26/2012 showed progressive metastatic disease to the spine with increased pathologic fractures at T3 and T4. Despite the previous T3 laminectomies there was early cord compression at T3 and right-sided neural foraminal extension of epidural tumor at T2-T3. Bulky extra osseous metastatic disease was again demonstrated within the mediastinum and left pleural space. She began treatment with Taxol/Herceptin/Pertuzumab with the Taxol given on a day 1/day 8 schedule and the Herceptin/Pertuzumab given day 1 only of a 21 day cycle on 12/29/2012.  2. Ulcerated left chest lesion, likely a metastatic lesion. The area of induration appears question increased today.  3. Severe right low back/buttock pain radiating to the  left leg October 2010 with MRI on April 06, 2009 revealing a disk protrusion at L5-S1 displacing the right S1 nerve root. She was evaluated by Dr. Jordan Likes. The pain has resolved.  4. Hospitalization December 1 through May 31, 2009 with nausea, vomiting and abdominal pain - resolved.  5. 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  6. Pain at the right back/ iliac region October 25, 2008.  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  8.  Status post left knee replacement.  9. Chronic edema of the left lower leg.  10. "Migraines". She takes Imitrex as needed.  11. Falls and balance problems occurring over the past year. She was referred to neurology. No difficulty with ambulation today.  12. Right breast with area of firmness on exam March 2013 -negative right breast mammogram and ultrasound 09/15/2011. Negative right breast mammogram 09/16/2012.  13. Pain at upper back. Resolved following the laminectomy procedure. The pain increased. CT and MRI confirmed progressive disease. She continues morphine as needed.  14. Unsteady gait on 04/14/2012-much improved.  15. Nausea/vomiting following Faslodex 07/26/2012.  16. History of weight loss-likely secondary to progression of the metastatic breast cancer.  17. Persistent nausea and diarrhea following initiation of Taxol/Herceptin/pertuzumab 12/29/2012. 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 Admission with severe anemia and a gastric ulcer 02/09/2013, biopsy positive for H. Pylori. Hemoglobin is stable to improved.  19. Left upper extremity DVT documented during the 02/09/2013 hospital admission-not treated.  20. Status post SRS to a left parietal brain lesion on 02/03/2013.  Disposition-she appears stable. The chest wall disease may be increased and recently she has had more back pain. We discussed initiating treatment with Herceptin and Pertuzumab. She will consider this but does not want to begin treatment until after the Christmas holiday. She will return for a followup visit on 06/28/2013. She would like [place] to schedule treatment that day. She will contact the office in the interim with any problems. We specifically discussed worsening back pain.  Patient seen with Dr. Truett Perna.   Lonna Cobb ANP/GNP-BC   This was a shared visit with Lonna Cobb. [name] [name] has metastatic breast cancer. She appears to have disease progression over the  chest wall. I suspect the metastatic tumor burden is progressing in other sites. We will followup on the CA 27-29 from today. She agrees to immunotherapy with Herceptin and pertuzumab. She will not agree to chemotherapy. The plan is to initiate systemic therapy after an office visit on 06/28/2013.   Mancel Bale, M.D.

## 2013-05-24 NOTE — Telephone Encounter (Signed)
Gave  pt appt for lab and MD , gave chemo order to [facility] for appts

## 2013-05-24 NOTE — Telephone Encounter (Signed)
Gave pt appt for lab ,Md and chemo for 12/30

## 2013-06-01 ENCOUNTER — Other Ambulatory Visit: Payer: Self-pay | Admitting: Neurosurgery

## 2013-06-01 DIAGNOSIS — C7951 Secondary malignant neoplasm of bone: Secondary | ICD-10-CM

## 2013-06-03 ENCOUNTER — Other Ambulatory Visit: Payer: Self-pay | Admitting: Radiation Therapy

## 2013-06-03 DIAGNOSIS — C7931 Secondary malignant neoplasm of brain: Secondary | ICD-10-CM

## 2013-06-09 ENCOUNTER — Ambulatory Visit
Admission: RE | Admit: 2013-06-09 | Discharge: 2013-06-09 | Disposition: A | Discharge status: Home / Self Care | Payer: Commercial Managed Care - HMO | Source: Ambulatory Visit | Attending: Neurosurgery | Admitting: Neurosurgery

## 2013-06-09 DIAGNOSIS — C7951 Secondary malignant neoplasm of bone: Secondary | ICD-10-CM

## 2013-06-09 MED ORDER — GADOBENATE DIMEGLUMINE 529 MG/ML IV SOLN
15.0000 mL | Freq: Once | INTRAVENOUS | Status: AC | PRN
  Administered 2013-06-09: 15 mL via INTRAVENOUS

## 2013-06-13 ENCOUNTER — Ambulatory Visit
Admission: RE | Admit: 2013-06-13 | Discharge: 2013-06-13 | Disposition: A | Discharge status: Home / Self Care | Payer: Commercial Managed Care - HMO | Source: Ambulatory Visit | Attending: Radiation Oncology | Admitting: Radiation Oncology

## 2013-06-13 DIAGNOSIS — C7931 Secondary malignant neoplasm of brain: Secondary | ICD-10-CM

## 2013-06-13 MED ORDER — GADOBENATE DIMEGLUMINE 529 MG/ML IV SOLN
15.0000 mL | Freq: Once | INTRAVENOUS | Status: AC | PRN
  Administered 2013-06-13: 15 mL via INTRAVENOUS

## 2013-06-13 NOTE — Progress Notes (Signed)
Quick Note:  Susan, please set-up SRS ______ 

## 2013-06-15 ENCOUNTER — Ambulatory Visit
Admission: RE | Admit: 2013-06-15 | Discharge: 2013-06-15 | Disposition: A | Discharge status: Home / Self Care | Payer: Medicare HMO | Source: Ambulatory Visit | Attending: Radiation Oncology | Admitting: Radiation Oncology

## 2013-06-15 ENCOUNTER — Encounter: Payer: Self-pay | Admitting: Radiation Oncology

## 2013-06-15 VITALS — BP 157/85 | HR 79 | Temp 98.0°F | Resp 16 | Ht 68.0 in | Wt 171.3 lb

## 2013-06-15 DIAGNOSIS — C7931 Secondary malignant neoplasm of brain: Secondary | ICD-10-CM | POA: Insufficient documentation

## 2013-06-15 DIAGNOSIS — C50919 Malignant neoplasm of unspecified site of unspecified female breast: Secondary | ICD-10-CM | POA: Insufficient documentation

## 2013-06-15 DIAGNOSIS — Z510 Encounter for antineoplastic radiation therapy: Secondary | ICD-10-CM | POA: Insufficient documentation

## 2013-06-15 DIAGNOSIS — F411 Generalized anxiety disorder: Secondary | ICD-10-CM | POA: Insufficient documentation

## 2013-06-15 MED ORDER — HEPARIN SOD (PORK) LOCK FLUSH 100 UNIT/ML IV SOLN
500.0000 [IU] | Freq: Once | INTRAVENOUS | Status: AC
  Administered 2013-06-15: 500 [IU] via INTRAVENOUS

## 2013-06-15 MED ORDER — SODIUM CHLORIDE 0.9 % IJ SOLN
10.0000 mL | Freq: Once | INTRAMUSCULAR | Status: AC
  Administered 2013-06-15: 10 mL via INTRAVENOUS

## 2013-06-15 NOTE — Progress Notes (Signed)
Radiation Oncology         [phone]) [phone] ________________________________  Name: [name] [name] MRN: [id number]  Date: 06/15/2013  DOB: 11-Jan-1946  Follow-Up Visit Note  CC: Lorretta Harp, MD  Ladene Artist, MD  Diagnosis:   67 yo woman s/p pre-op SRS to a Left parietal 4 cm brain metastasis from metastatic breast cancer on 02/03/2013 to 15 Gy followed by resection  Interval Since Last Radiation:  4  months  Narrative:  The patient returns today following follow-up MRI showing a new brain metastasis.                              ALLERGIES:  is allergic to banana.  Meds: Current Outpatient Prescriptions  Medication Sig Dispense Refill  . cyclobenzaprine (FLEXERIL) 10 MG tablet Take 10 mg by mouth 3 (three) times daily as needed for muscle spasms.      [name] morphine (MSIR) 15 MG tablet Take 1 tablet (15 mg total) by mouth every 4 (four) hours as needed.  90 tablet  0  . pantoprazole (PROTONIX) 20 MG tablet Take 1 tablet (20 mg total) by mouth 2 (two) times daily.  60 tablet  2  . albuterol (PROVENTIL HFA;VENTOLIN HFA) 108 (90 BASE) MCG/ACT inhaler Inhale 2 puffs into the lungs every 6 (six) hours as needed for wheezing.      . docusate sodium (COLACE) 50 MG capsule Take 100 mg by mouth 2 (two) times daily as needed for constipation.      . ondansetron (ZOFRAN) 8 MG tablet Take 8 mg by mouth every 8 (eight) hours as needed for nausea.       No current facility-administered medications for this encounter.    Physical Findings: The patient is in no acute distress. Patient is alert and oriented.  height is 5\' 8"  (1.727 m) and weight is 171 lb 4.8 oz (77.701 kg). Her oral temperature is 98 F (36.7 C). Her blood pressure is 157/85 and her pulse is 79. Her respiration is 16 and oxygen saturation is 100%. .  No significant changes.  Lab Findings: Lab Results  Component Value Date   WBC 4.5 05/24/2013   HGB 10.0* 05/24/2013   HCT 31.4* 05/24/2013   MCV 85.8 05/24/2013   PLT 239  05/24/2013    @LASTCHEM @  Radiographic Findings: Ct Thoracic Spine Wo Contrast  06/09/2013   CLINICAL DATA:  Pathologic fracture of T3 with multiple osseous metastases in the thoracic spine.  EXAM: CT THORACIC SPINE WITHOUT CONTRAST  TECHNIQUE: Multidetector CT imaging of the thoracic spine was performed without intravenous contrast administration. Multiplanar CT image reconstructions were also generated.  COMPARISON:  Chest x-ray dated 06/01/2013 and CT scan dated 12/24/2012 and MRI dated 04/19/2012  FINDINGS: There has been no significant change in the appearance of the thoracic spine since the prior exam dated 12/24/2012. Pathologic fractures of T3 and T4 are again noted essentially unchanged. Metastatic disease in T3 and T4 appears unchanged. The patient has had posterior decompression at the T3 level. The patient has posterior fusion from T1 through T6. There is lucency around the screws in T1 and T2 and around the screws in T5 and T6, with the exception of minimal lucency around the right screws at T2 and T5.  There is no further collapse of the T3 and T4 vertebra vertebra. There are no new metastatic lesions in the visualized portion of the spine.  The patient has a large  right pleural effusion, increased since the prior exam. Mediastinal adenopathy appears unchanged. Destruction of the posterior aspect of the left 9th rib is unchanged. Large pleural masses at the left lung base posteriorly appear essentially unchanged.  IMPRESSION: No significant change in the appearance of the thoracic spine since the prior study. No new lesions.  Increased large left pleural effusion.   Electronically Signed   By: Geanie Cooley M.D.   On: 06/09/2013 14:09   Mr Laqueta Jean ZO Contrast  06/13/2013   CLINICAL DATA:  Metastatic breast cancer.  S RS restaging.  EXAM: MRI HEAD WITHOUT AND WITH CONTRAST  TECHNIQUE: Multiplanar, multiecho pulse sequences of the brain and surrounding structures were obtained without and with  intravenous contrast.  CONTRAST:  15mL MULTIHANCE GADOBENATE DIMEGLUMINE 529 MG/ML IV SOLN  COMPARISON:  01/21/2013.  FINDINGS: Cystic metastasis in the left posterior parietal region previously measured 3.5 x 2.6 x 4.0 cm. On today's study it measures 2.9 by 2.0 by 3.8 cm. Previously, there was a a shaggy rim of enhancing tissue extending circumferentially around the lesion. On today's study, there are only a few punctate foci of residual enhancing tissue.  There is a newly seen metastasis in the left posterior temporal lobe measuring 11 x 8 mm with mild surrounding edema. No other enhancing lesions are seen.  Small vessel ischemic changes throughout the deep and subcortical white matter appear the same. No acute infarction. No hydrocephalus. No extra-axial fluid collection. No skull or skullbase metastasis seen.  IMPRESSION: Favorable response to therapy with respect to the cystic metastasis in the left posterior parietal region. The cavity is smaller and there are only a few small foci of enhancement around the margins.  Newly seen metastasis in the left posterior temporal lobe measuring 8 x 11 mm.   Electronically Signed   By: Paulina Fusi M.D.   On: 06/13/2013 15:29   Mr Thoracic Spine W Wo Contrast  06/09/2013   ADDENDUM REPORT: 06/09/2013 13:33  ADDENDUM: BUN and creatinine were obtained on site at [facility] at  [address]  Results:  BUN 13 mg/dL,  Creatinine 0.9 mg/dL.   Electronically Signed   By: Loralie Champagne M.D.   On: 06/09/2013 13:33   06/09/2013   CLINICAL DATA:  Severe back pain. Known metastatic disease. Prior spinal stabilization.  EXAM: MRI THORACIC SPINE WITHOUT AND WITH CONTRAST  TECHNIQUE: Multiplanar and multiecho pulse sequences of the thoracic spine were obtained without and with intravenous contrast.  CONTRAST:  15mL MULTIHANCE GADOBENATE DIMEGLUMINE 529 MG/ML IV SOLN  COMPARISON:  CT thoracic spine 06/09/2013.  MRI 12/06/2012.  FINDINGS: Stable metastatic disease  involving T3, T4 and T5. A small lesion is also noted in the anterior aspect of T2. Extensive mediastinal tumor or noted adjacent to the esophagus and trachea. Could not exclude chest wall invasion on the study. Is also large left pleural effusion AN extensive lower left pleural tumor which is definitely invading the chest wall.  The fixation hardware is intact. There is tumor surrounding the T5 pedicle screws but this appears relatively stable. The retropulsion at the severely compressed T3 vertebral body appears unchanged. There is mild mass effect on the thecal sac with probable tumor in the pedicles.  I do not see any new metastatic lesions. Probable healing pathologic fracture of T4. No worrisome spinal cord lesions or cord enhancement. There is a stable lesion in the left lamina at T8.  IMPRESSION: 1. Overall stable osseous metastatic disease involving T2, T3,  T4 and T5. Stable compression fracture of T3 and stable retropulsion. There is also a pathologic fracture of T4. Tumor surrounds the pedicle screws at T5. 2. Extensive mediastinal and pleural tumor on the left invading the lower left chest wall. There is also a probable malignant left pleural effusion. 3. Stable lesion in the left lamina at T8.  Electronically Signed: By: Loralie Champagne M.D. On: 06/09/2013 13:23    Impression:  The patient has a new brain metastasis amenable to [facility]  Plan:  Today, I talked to the patient about the findings and work-up thus far.  We discussed radiosurgery in the management of her new lesion.  We discussed the available radiation techniques, and focused on the details of logistics and delivery.  We reviewed the anticipated acute and late sequelae associated with radiation in this setting.  The patient would like to proceed with radiation and will be scheduled for CT simulation later today.  I spent 30 minutes minutes face to face with the patient and more than 50% of that time was spent in counseling and/or  coordination of care.   _____________________________________  Artist Pais. Kathrynn Running, M.D.

## 2013-06-15 NOTE — Progress Notes (Signed)
Patient reports thoracic back pain 8 on a scale of 0-10 despite taking Morphine IR 15 mg. Steady gait noted. Tearful affect noted. Patient reports that she "gets scared when she comes to the doctors office." Denies headache, dizziness, nausea, or vomiting. Denies diplopia or ringing in th ears. Patient brought all her medication with her therefore medication list is up to date. Presently, patient is not taking decadron.

## 2013-06-15 NOTE — Progress Notes (Signed)
  Radiation Oncology         [phone] ________________________________  Name: ABBIGALE MCELHANEY MRN: [id number]  Date: 06/15/2013  DOB: 1945-10-15  SIMULATION AND TREATMENT PLANNING NOTE  DIAGNOSIS:  67 yo woman with a new 11 mm brain met  NARRATIVE:  The patient was brought to the CT Simulation planning suite.  Identity was confirmed.  All relevant records and images related to the planned course of therapy were reviewed.  The patient freely provided informed written consent to proceed with treatment after reviewing the details related to the planned course of therapy. The consent form was witnessed and verified by the simulation staff. Intravenous access was established for contrast administration. Then, the patient was set-up in a stable reproducible supine position for radiation therapy.  A relocatable thermoplastic stereotactic head frame was fabricated for precise immobilization.  CT images were obtained.  Surface markings were placed.  The CT images were loaded into the planning software and fused with the patient's targeting MRI scan.  Then the target and avoidance structures were contoured.  Treatment planning then occurred.  The radiation prescription was entered and confirmed.  I have requested 3D planning  I have requested a DVH of the following structures: Brain stem, brain, left eye, right I, lenses, optic chiasm, target volumes, uninvolved brain, and normal tissue.    PLAN:  The patient will receive 20 Gy in one fraction.  ________________________________  Artist Pais Kathrynn Running, M.D.

## 2013-06-15 NOTE — Progress Notes (Signed)
Flushed port with 10 cc normal saline and 500 units heparin.  Deaccessed needle from right chest port-a-cath.  Covered with band-aid.

## 2013-06-21 ENCOUNTER — Encounter: Payer: Self-pay | Admitting: Radiation Oncology

## 2013-06-21 ENCOUNTER — Ambulatory Visit
Admission: RE | Admit: 2013-06-21 | Discharge: 2013-06-21 | Disposition: A | Discharge status: Home / Self Care | Payer: Medicare HMO | Source: Ambulatory Visit | Attending: Radiation Oncology | Admitting: Radiation Oncology

## 2013-06-21 VITALS — BP 152/84 | HR 86 | Temp 98.5°F

## 2013-06-21 DIAGNOSIS — C7931 Secondary malignant neoplasm of brain: Secondary | ICD-10-CM

## 2013-06-21 MED ORDER — LORAZEPAM 1 MG PO TABS
1.0000 mg | ORAL_TABLET | Freq: Once | ORAL | Status: AC
  Administered 2013-06-21: 1 mg via ORAL
  Filled 2013-06-21: qty 1

## 2013-06-21 NOTE — Op Note (Signed)
Stereotactic Radiosurgery Operative Note  Name: [name] [name] MRN: [id number]  Date: 06/21/2013  DOB: December 08, 1945  Op Note  Pre Operative Diagnosis:  Left temporal metastatic brain tumor  Post Operative Diagnois:  Left temporal metastatic brain tumor  3D TREATMENT PLANNING AND DOSIMETRY:  The patient's radiation plan was reviewed and approved by myself (neurosurgery) and Dr. Margaretmary Bayley (radiation oncology) prior to treatment.  It showed 3-dimensional radiation distributions overlaid onto the planning CT/MRI image set.  The [facility] for the target structures as well as the organs at risk were reviewed. The documentation of the 3D plan and dosimetry are filed in the radiation oncology EMR.  NARRATIVE:  [name] [name] was brought to the TrueBeam stereotactic radiation treatment machine and placed supine on the CT couch. The head frame was applied, and the patient was set up for stereotactic radiosurgery.  I was present for the set-up and delivery.  SIMULATION VERIFICATION:  In the couch zero-angle position, the patient underwent Exactrac imaging using the Brainlab system with orthogonal KV images.  These were carefully aligned and repeated to confirm treatment position for each of the isocenters.  The Exactrac snap film verification was repeated at each couch angle.  SPECIAL TREATMENT PROCEDURE: [name] [name] received stereotactic radiosurgery to the following target: Left posterior temporal target was treated using 3 Dynamic Conformal Arcs to a prescription dose of 20 Gy.  ExacTrac registration was performed for each couch angle.  The 84% isodose line was prescribed.  STEREOTACTIC TREATMENT MANAGEMENT:  Following delivery, the patient was transported to nursing in stable condition and monitored for possible acute effects.  Vital signs were recorded There were no vitals taken for this visit.. The patient tolerated treatment without significant acute effects, and was discharged to home in stable  condition.    PLAN: Follow-up in one month.

## 2013-06-21 NOTE — Progress Notes (Signed)
  Radiation Oncology         [phone]) [phone] ________________________________  Stereotactic Treatment Procedure Note  Name: LAYNA ROEPER MRN: [id number]  Date: 06/21/2013  DOB: 06/09/1946  SPECIAL TREATMENT PROCEDURE  3D TREATMENT PLANNING AND DOSIMETRY:  The patient's radiation plan was reviewed and approved by neurosurgery and radiation oncology prior to treatment.  It showed 3-dimensional radiation distributions overlaid onto the planning CT/MRI image set.  The [facility] for the target structures as well as the organs at risk were reviewed. The documentation of the 3D plan and dosimetry are filed in the radiation oncology EMR.  NARRATIVE:  ANDREAL VULTAGGIO was brought to the TrueBeam stereotactic radiation treatment machine and placed supine on the CT couch. The head frame was applied, and the patient was set up for stereotactic radiosurgery.  Neurosurgery was present for the set-up and delivery  Ativan 1 mg was administered PO due to patient anxiety.  SIMULATION VERIFICATION:  In the couch zero-angle position, the patient underwent Exactrac imaging using the Brainlab system with orthogonal KV images.  These were carefully aligned and repeated to confirm treatment position for each of the isocenters.  The Exactrac snap film verification was repeated at each couch angle.  SPECIAL TREATMENT PROCEDURE: [name] received stereotactic radiosurgery to the following targets: Left temporal 11 mm target was treated using 3 Dynamic Conformal Arcs to a prescription dose of 20 Gy.  ExacTrac registration was performed for each couch angle.  The 84% isodose line was prescribed.  STEREOTACTIC TREATMENT MANAGEMENT:  Following delivery, the patient was transported to nursing in stable condition and monitored for possible acute effects.  Vital signs were recorded BP 152/84  Pulse 86  Temp(Src) 98.5 F (36.9 C)  SpO2 99%. The patient tolerated treatment without significant acute effects, and was  discharged to home in stable condition.    PLAN: Follow-up in one month.  ________________________________  Artist Pais. Kathrynn Running, M.D.

## 2013-06-25 NOTE — Progress Notes (Signed)
  Radiation Oncology         ([phone]) [phone] ________________________________  Name: [name] [name] MRN: [id number]  Date: 06/21/2013  DOB: [birth year]  End of Treatment Note  Diagnosis:   67 yo woman with a new 11 mm brain met  Indication for treatment:  Local Control, Palliation       Radiation treatment dates:   06/21/2013  Site/dose/beams/energy:   [name] received stereotactic radiosurgery to the following targets:  Left temporal 11 mm target was treated using 3 Dynamic Conformal Arcs to a prescription dose of 20 Gy. ExacTrac registration was performed for each couch angle. The 84% isodose line was prescribed.  6 MV X-rays were used in the FFF mode.  Narrative: The patient tolerated radiation treatment relatively well, other than anxiety requiring ativan.  Plan: The patient has completed radiation treatment. The patient will return to radiation oncology clinic for routine followup in one month. I advised them to call or return sooner if they have any questions or concerns related to their recovery or treatment. ________________________________  Artist Pais. Kathrynn Running, M.D.

## 2013-06-26 ENCOUNTER — Other Ambulatory Visit: Payer: Self-pay | Admitting: Oncology

## 2013-06-27 ENCOUNTER — Telehealth: Payer: Self-pay | Admitting: Oncology

## 2013-06-27 NOTE — Telephone Encounter (Signed)
Pt called and wants to r/s labs,md and chemo for 12/30, emailed Md regarding Md spot, waiting for response

## 2013-06-28 ENCOUNTER — Ambulatory Visit: Payer: Medicare HMO

## 2013-06-28 ENCOUNTER — Ambulatory Visit: Payer: Medicare HMO | Admitting: Oncology

## 2013-06-28 ENCOUNTER — Telehealth: Payer: Self-pay | Admitting: *Deleted

## 2013-06-28 ENCOUNTER — Other Ambulatory Visit: Payer: Medicare HMO

## 2013-06-28 NOTE — Telephone Encounter (Signed)
Left VM that she missed her Monday appointment and that someone called her about her chemo appointment. She has decided that she does not want any further chemotherapy. Only wants to see Dr. Truett Perna or Misty Stanley every 3 months or so as she was doing before.  Forwarded message to MD.

## 2013-06-29 ENCOUNTER — Other Ambulatory Visit: Payer: Self-pay | Admitting: *Deleted

## 2013-06-29 NOTE — Progress Notes (Signed)
Per Dr. Truett Perna : Needs to be seen in 3-4 weeks. POF to scheduler

## 2013-07-01 ENCOUNTER — Telehealth: Payer: Self-pay | Admitting: Oncology

## 2013-07-01 NOTE — Telephone Encounter (Signed)
, °

## 2013-07-04 ENCOUNTER — Other Ambulatory Visit: Payer: Self-pay | Admitting: *Deleted

## 2013-07-04 NOTE — Progress Notes (Signed)
Per Dr. Benay Spice: Needs OV with midlevel on 1/29 with PAC flush. POF to scheduler.

## 2013-07-05 ENCOUNTER — Telehealth: Payer: Self-pay | Admitting: Oncology

## 2013-07-15 ENCOUNTER — Telehealth: Payer: Self-pay | Admitting: *Deleted

## 2013-07-15 DIAGNOSIS — M549 Dorsalgia, unspecified: Secondary | ICD-10-CM

## 2013-07-15 MED ORDER — MORPHINE SULFATE 15 MG PO TABS: 15.0000 mg | ORAL_TABLET | ORAL | Status: DC | PRN

## 2013-07-15 NOTE — Telephone Encounter (Signed)
Call from pt requesting refill on MSIR. Reports back pain is unchanged. MSIR effective. Rx will be left in prescription book for pick up. Next appt confirmed.

## 2013-07-18 ENCOUNTER — Encounter: Payer: Self-pay | Admitting: Radiation Oncology

## 2013-07-18 ENCOUNTER — Ambulatory Visit
Admission: RE | Admit: 2013-07-18 | Discharge: 2013-07-18 | Disposition: A | Discharge status: Home / Self Care | Payer: Medicare HMO | Source: Ambulatory Visit | Attending: Radiation Oncology | Admitting: Radiation Oncology

## 2013-07-18 VITALS — BP 141/74 | HR 82 | Temp 98.0°F | Resp 16 | Wt 167.0 lb

## 2013-07-18 DIAGNOSIS — C7931 Secondary malignant neoplasm of brain: Secondary | ICD-10-CM

## 2013-07-18 NOTE — Progress Notes (Signed)
  Radiation Oncology         [phone] ________________________________  Name: [name] [name] MRN: [id number]  Date: 07/18/2013  DOB: 07-13-1945  Follow-Up Visit Note  CC: Ivor Costa, MD  Hosie Spangle, MD  Diagnosis:   68 yo woman with metastatic breast cancer s/p: 1.  pre-op SRS to a Left parietal 4 cm brain metastasis from metastatic breast cancer on 02/03/2013 to 15 Gy followed by resection  2.  06/21/2013 stereotactic radiosurgery to the Left temporal 11 mm metastasis to 20 Gy.  Interval Since Last Radiation:  4  weeks  Narrative:  The patient returns today for routine follow-up.  She is without any complaints.  She was again offered systemic therapy by Dr. Benay Spice, and she has again refused this, which is unfortunate.                ALLERGIES:  is allergic to banana.  Meds: Current Outpatient Prescriptions  Medication Sig Dispense Refill  . morphine (MSIR) 15 MG tablet Take 1 tablet (15 mg total) by mouth every 4 (four) hours as needed.  90 tablet  0  . pantoprazole (PROTONIX) 20 MG tablet Take 1 tablet (20 mg total) by mouth 2 (two) times daily.  60 tablet  2  . albuterol (PROVENTIL HFA;VENTOLIN HFA) 108 (90 BASE) MCG/ACT inhaler Inhale 2 puffs into the lungs every 6 (six) hours as needed for wheezing.      . cyclobenzaprine (FLEXERIL) 10 MG tablet Take 10 mg by mouth 3 (three) times daily as needed for muscle spasms.      [name] docusate sodium (COLACE) 50 MG capsule Take 100 mg by mouth 2 (two) times daily as needed for constipation.      . ondansetron (ZOFRAN) 8 MG tablet Take 8 mg by mouth every 8 (eight) hours as needed for nausea.       No current facility-administered medications for this encounter.    Physical Findings: The patient is in no acute distress. Patient is alert and oriented.  weight is 167 lb (75.751 kg). Her oral temperature is 98 F (36.7 C). Her blood pressure is 141/74 and her pulse is 82. Her respiration is 16 and oxygen saturation is 100%. .  No  significant changes.  Impression:  The patient is recovering from the effects of radiation.  Plan:  MRI in 3 months, then see Dr. Sherwood Gambler.  _____________________________________  [name]. Tammi Klippel, M.D.

## 2013-07-18 NOTE — Progress Notes (Signed)
Denies pain today. Reports occasional thoracic back pain which morphine relieves. Steady gait noted. Denies numbness or tingling in her arms or legs. Denies nausea, vomiting, or dizziness. Denies headaches. Denies diplopia or ringing in the ears. Responds appropriately to questions. Denies episodes of confusion or seizure activity.

## 2013-07-20 ENCOUNTER — Other Ambulatory Visit: Payer: Self-pay | Admitting: Radiation Therapy

## 2013-07-20 DIAGNOSIS — C7931 Secondary malignant neoplasm of brain: Secondary | ICD-10-CM

## 2013-07-20 DIAGNOSIS — C7949 Secondary malignant neoplasm of other parts of nervous system: Principal | ICD-10-CM

## 2013-07-28 ENCOUNTER — Ambulatory Visit: Payer: Medicare HMO | Admitting: Nurse Practitioner

## 2013-08-01 ENCOUNTER — Ambulatory Visit (HOSPITAL_BASED_OUTPATIENT_CLINIC_OR_DEPARTMENT_OTHER): Payer: Medicare HMO | Admitting: Oncology

## 2013-08-01 ENCOUNTER — Ambulatory Visit (HOSPITAL_BASED_OUTPATIENT_CLINIC_OR_DEPARTMENT_OTHER): Payer: Medicare HMO

## 2013-08-01 ENCOUNTER — Telehealth: Payer: Self-pay | Admitting: Oncology

## 2013-08-01 ENCOUNTER — Encounter (INDEPENDENT_AMBULATORY_CARE_PROVIDER_SITE_OTHER): Payer: Self-pay

## 2013-08-01 VITALS — BP 157/84 | HR 78 | Temp 98.3°F | Resp 18 | Ht 68.0 in | Wt 165.0 lb

## 2013-08-01 DIAGNOSIS — C7951 Secondary malignant neoplasm of bone: Secondary | ICD-10-CM

## 2013-08-01 DIAGNOSIS — R110 Nausea: Secondary | ICD-10-CM

## 2013-08-01 DIAGNOSIS — D649 Anemia, unspecified: Secondary | ICD-10-CM

## 2013-08-01 DIAGNOSIS — R197 Diarrhea, unspecified: Secondary | ICD-10-CM

## 2013-08-01 DIAGNOSIS — C50919 Malignant neoplasm of unspecified site of unspecified female breast: Secondary | ICD-10-CM

## 2013-08-01 DIAGNOSIS — L98499 Non-pressure chronic ulcer of skin of other sites with unspecified severity: Secondary | ICD-10-CM

## 2013-08-01 DIAGNOSIS — C7952 Secondary malignant neoplasm of bone marrow: Secondary | ICD-10-CM

## 2013-08-01 DIAGNOSIS — C782 Secondary malignant neoplasm of pleura: Secondary | ICD-10-CM

## 2013-08-01 DIAGNOSIS — I82409 Acute embolism and thrombosis of unspecified deep veins of unspecified lower extremity: Secondary | ICD-10-CM

## 2013-08-01 DIAGNOSIS — R609 Edema, unspecified: Secondary | ICD-10-CM

## 2013-08-01 DIAGNOSIS — M549 Dorsalgia, unspecified: Secondary | ICD-10-CM

## 2013-08-01 MED ORDER — HEPARIN SOD (PORK) LOCK FLUSH 100 UNIT/ML IV SOLN
500.0000 [IU] | Freq: Once | INTRAVENOUS | Status: AC
  Administered 2013-08-01: 500 [IU] via INTRAVENOUS
  Filled 2013-08-01: qty 5

## 2013-08-01 MED ORDER — SODIUM CHLORIDE 0.9 % IJ SOLN
10.0000 mL | INTRAMUSCULAR | Status: DC | PRN
  Administered 2013-08-01: 10 mL via INTRAVENOUS
  Filled 2013-08-01: qty 10

## 2013-08-01 MED ORDER — MORPHINE SULFATE 15 MG PO TABS: 15.0000 mg | ORAL_TABLET | ORAL | Status: DC | PRN

## 2013-08-01 MED ORDER — ALBUTEROL SULFATE HFA 108 (90 BASE) MCG/ACT IN AERS: 2.0000 | INHALATION_SPRAY | Freq: Four times a day (QID) | RESPIRATORY_TRACT | Status: DC | PRN

## 2013-08-01 NOTE — Patient Instructions (Signed)

## 2013-08-01 NOTE — Progress Notes (Signed)
[place]    OFFICE PROGRESS NOTE   INTERVAL HISTORY:   She returns for scheduled followup of metastatic breast cancer. She completed SRS treatment to a new brain metastasis on 06/21/2013. She continues to have intermittent back pain, relieved with MSIR. Exertional dyspnea is improved with an inhaler. She denies neurologic symptoms.  Objective:  Vital signs in last 24 hours:  Blood pressure 157/84, pulse 78, temperature 98.3 F (36.8 C), temperature source Oral, resp. rate 18, height 5\' 8"  (1.727 m), weight 165 lb (74.844 kg).    HEENT: Neck without mass Lymphatics: Pea-sized left scalene node. No other cervical, supraclavicular, or axillary nodes Resp: Decreased breath sounds over the left chest, no respiratory distress Cardio: Regular rate and rhythm GI: No hepatomegaly, nontender Vascular: The left lower leg is slightly larger than the right side, no edema Breasts: Status post left mastectomy. 3 cm area of thickening at the left mastectomy scar with a central ulceration   Portacath/PICC-without erythema  Lab Results:  Lab Results  Component Value Date   WBC 4.5 05/24/2013   HGB 10.0* 05/24/2013   HCT 31.4* 05/24/2013   MCV 85.8 05/24/2013   PLT 239 05/24/2013   NEUTROABS 2.6 05/24/2013      Medications: I have reviewed the patient's current medications.  Assessment/Plan: 1.Metastatic breast cancer - initially diagnosed with breast cancer in 1996 status post left mastectomy and axillary lymph node dissection. She developed a chest wall recurrence in 2001 and completed radiation. (See previous note for subsequent treatment history)  2. Ulcerated left chest lesion, likely a metastatic lesion. The area of induration appears question increased today.  3. Severe right low back/buttock pain radiating to the left leg October 2010 with MRI on April 06, 2009 revealing a disk protrusion at L5-S1 displacing the right S1 nerve root. She was evaluated by Dr.  Annette Stable. The pain has resolved.  4. Hospitalization December 1 through May 31, 2009 with nausea, vomiting and abdominal pain - resolved.  5. 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  6. Pain at the right back/ iliac region October 25, 2008.  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  8. Status post left knee replacement.  9. Chronic edema of the left lower leg.  10. "Migraines". She takes Imitrex as needed.  11. Falls and balance problems occurring over the past year. She was referred to neurology. No difficulty with ambulation today.  12. Right breast with area of firmness on exam March 2013 -negative right breast mammogram and ultrasound 09/15/2011. Negative right breast mammogram 09/16/2012.  13. Pain at upper back. Resolved following the laminectomy procedure. The pain increased. CT and MRI confirmed progressive disease. She continues morphine as needed.  14. Unsteady gait on 04/14/2012-much improved.  15. Nausea/vomiting following Faslodex 07/26/2012.  16. History of weight loss-likely secondary to progression of the metastatic breast cancer.  17. Persistent nausea and diarrhea following initiation of Taxol/Herceptin/pertuzumab 12/29/2012. 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 Admission with severe anemia and a gastric ulcer 02/09/2013, biopsy positive for H. Pylori. Hemoglobin is stable to improved.  19. Left upper extremity DVT documented during the 02/09/2013 hospital admission-not treated.  20. Status post SRS to a left parietal brain lesion on 02/03/2013. Status post SRS to a left temporal brain lesion 06/21/2014   Disposition:  Ms. Chancellor appears unchanged. She was scheduled to resume systemic therapy with  pertuzumab/Herceptin today. She has decided against systemic therapy. We again discussed the likelihood of a clinical response with systemic therapy. She  understands if the metastatic tumor continues to progress she may not be a candidate for treatment in the future.  Ms. Bojarski agrees to a followup visit and Port-A-Cath flush in 6 weeks.   Betsy Coder, MD  08/01/2013  10:04 AM

## 2013-08-01 NOTE — Telephone Encounter (Signed)
gv an printed appt sched and avs for pt for March

## 2013-08-16 ENCOUNTER — Ambulatory Visit: Payer: Medicare HMO | Admitting: Internal Medicine

## 2013-08-24 ENCOUNTER — Ambulatory Visit (INDEPENDENT_AMBULATORY_CARE_PROVIDER_SITE_OTHER): Payer: Medicare HMO | Admitting: Internal Medicine

## 2013-08-24 ENCOUNTER — Encounter: Payer: Self-pay | Admitting: Internal Medicine

## 2013-08-24 VITALS — BP 141/85 | HR 86 | Temp 97.3°F | Ht 68.0 in | Wt 164.9 lb

## 2013-08-24 DIAGNOSIS — C50919 Malignant neoplasm of unspecified site of unspecified female breast: Secondary | ICD-10-CM

## 2013-08-24 DIAGNOSIS — C800 Disseminated malignant neoplasm, unspecified: Secondary | ICD-10-CM

## 2013-08-24 DIAGNOSIS — K279 Peptic ulcer, site unspecified, unspecified as acute or chronic, without hemorrhage or perforation: Secondary | ICD-10-CM

## 2013-08-24 DIAGNOSIS — Z0000 Encounter for general adult medical examination without abnormal findings: Secondary | ICD-10-CM

## 2013-08-24 DIAGNOSIS — J45909 Unspecified asthma, uncomplicated: Secondary | ICD-10-CM

## 2013-08-24 MED ORDER — ALBUTEROL SULFATE HFA 108 (90 BASE) MCG/ACT IN AERS: 2.0000 | INHALATION_SPRAY | Freq: Four times a day (QID) | RESPIRATORY_TRACT | Status: DC | PRN

## 2013-08-24 NOTE — Assessment & Plan Note (Signed)
Patient has end stage of metastasized breast cancer. She has been followed up by Dr.Sherrill, last seen on 08/01/13. Discussed to start Pertuzumab/Herceptin treatment, but patient refused any more chemotherapy.  Pt is DNR. On palliative care with home health services (7 days/week). She has chronic back pain which is well controlled by oral morphine. Today she feels great without complaint. Will continue to follow up.

## 2013-08-24 NOTE — Patient Instructions (Signed)
1. You have done great job in taking all your medications. I appreciate it very much. Please continue doing that. 2. Please take all medications as prescribed.  3. If you have worsening of your symptoms or new symptoms arise, please call the clinic [phone]), or go to the ER immediately if symptoms are severe.  Please bring in all your medication bottles with you in next visit.

## 2013-08-24 NOTE — Assessment & Plan Note (Addendum)
No signs of acute exacerbation. She uses albuterol inhaler occasionally. Lung auscultation is clear bilaterally. Will continue current regimen.

## 2013-08-24 NOTE — Progress Notes (Signed)
Patient ID: [name] [name], female   DOB: Jun 21, 1946, 68 y.o.   MRN: [id number] Subjective:   Patient ID: [name] [name] female   DOB: 06-18-1946 68 y.o.   MRN: [id number]  CC:   Follow up visit.   HPI:  [name] is a 68 y.o. lady with past medical history as outlined below, who presents for a followup visit today  Patient comes back for followup visit. She stopped taking Zofran, because she does not have nausea any more. She is taking oral morphine for back pain. Her back pain is well controlled. She does not have any complaints today. She feels great.   1. Metastatic Breast Ca- Initial diagnosis in 1996, s/p of L mastectomy and axillary node dissection, has metastasized  to brain, lungs, abdomen, and bone. Pt has been followed by Dr. Benay Spice, last seen on 08/01/13. Discussed to start Pertuzumab/Herceptin treatment, but patient refused any more chemotherapy.  Pt is DNR. On palliative care with home health services (7 days/week).   2. Asthma: She uses albuterol inhaler occasionally. Today, she does not have cough, chest pain, shortness of breath.  3. GERD:  Her symptoms is well controlled with Protonix.  ROS: has chronic back pain, Denies fever, chills, fatigue, headaches, cough, chest pain, SOB, abdominal pain, diarrhea, dysuria, urgency, frequency, hematuria, joint pain or leg swelling.   Past Medical History  Diagnosis Date  . Cancer 1996    breast  . Hyperlipidemia   . Hypertension   . Asthma     best PEF=400  . 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 Chronic low back pain     MRI 2010 showed right sided disease with paracentral  disc protusion at L5-S1 which contacts and displaces the right S1 nerve root within the lateral recess  . Migraine headache     MRI 11/18/10: Mild progression of prominent white matter type changes which may be related to a small vessel disease and / or  migraine headaches.Other causes for white matter type changes such as that secondaryto; vasculitis, inflammatory process or demyelinating process feltto be secondary considerations.  . Leg swelling   . Neuralgia and neuritis   . Tenosynovitis     left, MRI 05/2003  . Herniated disc     h/o ruptured disk, laminectomy L4-L5, Dr. Eulas Post  . Breast cancer 1996, 2001    f/b Dr. Learta Codding. s/p masectomy +radation. w/ metastatic disease, now on Femara.  . Tobacco abuse   . Hx of radiation therapy 06/21/02 -07/13/02    T1-T6  . Bone metastases     breast primary, T spine  . Shortness of breath     exertion  . Hx of migraines   . Hx of radiation therapy 04/21/12    T3-T4 spinal SRS  . Breast cancer metastasized to multiple sites 07/12/2012  . Brain cancer     new 4 cm brain met   Current Outpatient Prescriptions  Medication Sig Dispense Refill  . albuterol (PROVENTIL HFA;VENTOLIN HFA) 108 (90 BASE) MCG/ACT inhaler Inhale 2 puffs into the lungs every 6 (six) hours as needed for wheezing.  1 Inhaler  2  . cyclobenzaprine (FLEXERIL) 10 MG tablet Take 10 mg by mouth 3 (three) times daily as needed for muscle spasms.      [name] docusate sodium (COLACE) 50 MG capsule Take 100 mg by mouth 2 (two)  times daily as needed for constipation.      [name] morphine (MSIR) 15 MG tablet Take 1 tablet (15 mg total) by mouth every 4 (four) hours as needed.  90 tablet  0  . ondansetron (ZOFRAN) 8 MG tablet Take 8 mg by mouth every 8 (eight) hours as needed for nausea.      . pantoprazole (PROTONIX) 20 MG tablet Take 1 tablet (20 mg total) by mouth 2 (two) times daily.  60 tablet  2   No current facility-administered medications for this visit.   Family History  Problem Relation Age of Onset  . Anesthesia problems Neg Hx   . Hypotension Neg Hx   . Malignant hyperthermia Neg Hx   . Pseudochol deficiency Neg Hx    History   Social History  . Marital Status: Divorced    Spouse Name: N/A    Number of Children: N/A  .  Years of Education: N/A   Social History Main Topics  . Smoking status: Former Smoker -- 0.50 packs/day for 50 years    Types: Cigarettes    Quit date: 01/23/2013  . Smokeless tobacco: Never Used     Comment: quit smoking 01/23/13  . Alcohol Use: No  . Drug Use: Yes    Special: Oxycodone  . Sexual Activity: Not Currently   Other Topics Concern  . None   Social History Narrative  . None    Review of Systems: Full 14-point review of systems otherwise negative. See HPI.   Objective:  Physical Exam: Filed Vitals:   08/24/13 1041  BP: 141/85  Pulse: 86  Temp: 97.3 F (36.3 C)  TempSrc: Oral  Height: _0  (1.727 m)  Weight: 164 lb 14.4 oz (74.798 kg)  SpO2: 95%   Constitutional: Vital signs reviewed.  Patient is a well-developed and well-nourished, in no acute distress and cooperative with exam.   HEENT:  Head: Normocephalic and atraumatic Mouth: no erythema or exudates, MMM Eyes: PERRL, EOMI, conjunctivae normal, No scleral icterus.  Neck: Supple, Trachea midline normal ROM, No JVD  Cardiovascular: RRR, S1 normal, S2 normal, no MRG, pulses symmetric and intact bilaterally Pulmonary/Chest: CTAB, no wheezes, rales, or rhonchi. There is Port -a-cath placed over R upper chest, no signs of infection.  Abdominal: Soft. Non-tender, non-distended, bowel sounds are normal, no masses, organomegaly, or guarding present.  GU: no CVA tenderness Musculoskeletal: No joint deformities, erythema, or stiffness, ROM full and non-tender Extremities: No leg edema Hematology: no cervical, inginal, or axillary adenopathy.  Neurological: A&O x3, Strength is normal and symmetric bilaterally, cranial nerve II-XII are grossly intact, no focal motor deficit, sensory intact to light touch bilaterally.  Skin: Warm, dry and intact. No rash, cyanosis, or clubbing.  Psychiatric: Normal mood and affect. No suicidal or homicidal ideation.  Assessment & Plan:

## 2013-08-24 NOTE — Assessment & Plan Note (Signed)
Patient refused a flu shot and pneumococcal vaccination, will postpone.

## 2013-08-24 NOTE — Assessment & Plan Note (Signed)
Patient has a history of peptic ulcer with positive H. Pylori, S/P of antibiotic treatment. Currently on Protonix. No complaints today. Will continue Protonix 20 mg daily.

## 2013-08-30 ENCOUNTER — Other Ambulatory Visit: Payer: Self-pay

## 2013-08-30 DIAGNOSIS — Z1231 Encounter for screening mammogram for malignant neoplasm of breast: Secondary | ICD-10-CM

## 2013-08-30 DIAGNOSIS — Z9012 Acquired absence of left breast and nipple: Secondary | ICD-10-CM

## 2013-08-30 NOTE — Progress Notes (Signed)
INTERNAL MEDICINE TEACHING ATTENDING ADDENDUM - Kamil Mchaffie, DO: I reviewed and discussed at the time of visit with the resident Dr. Niu, the patient's medical history, physical examination, diagnosis and results of tests and treatment and I agree with the patient's care as documented. 

## 2013-09-06 ENCOUNTER — Other Ambulatory Visit: Payer: Self-pay | Admitting: Radiation Therapy

## 2013-09-06 DIAGNOSIS — C7949 Secondary malignant neoplasm of other parts of nervous system: Principal | ICD-10-CM

## 2013-09-06 DIAGNOSIS — C7931 Secondary malignant neoplasm of brain: Secondary | ICD-10-CM

## 2013-09-12 ENCOUNTER — Ambulatory Visit (HOSPITAL_BASED_OUTPATIENT_CLINIC_OR_DEPARTMENT_OTHER): Payer: Medicare HMO | Admitting: Nurse Practitioner

## 2013-09-12 ENCOUNTER — Other Ambulatory Visit: Payer: Medicare HMO

## 2013-09-12 ENCOUNTER — Telehealth: Payer: Self-pay | Admitting: Oncology

## 2013-09-12 VITALS — BP 153/84 | HR 74 | Temp 97.5°F | Resp 18 | Ht 68.0 in | Wt 162.7 lb

## 2013-09-12 DIAGNOSIS — R609 Edema, unspecified: Secondary | ICD-10-CM

## 2013-09-12 DIAGNOSIS — C7949 Secondary malignant neoplasm of other parts of nervous system: Secondary | ICD-10-CM

## 2013-09-12 DIAGNOSIS — C7931 Secondary malignant neoplasm of brain: Secondary | ICD-10-CM

## 2013-09-12 DIAGNOSIS — M549 Dorsalgia, unspecified: Secondary | ICD-10-CM

## 2013-09-12 DIAGNOSIS — R222 Localized swelling, mass and lump, trunk: Secondary | ICD-10-CM

## 2013-09-12 DIAGNOSIS — Z86718 Personal history of other venous thrombosis and embolism: Secondary | ICD-10-CM

## 2013-09-12 DIAGNOSIS — C50919 Malignant neoplasm of unspecified site of unspecified female breast: Secondary | ICD-10-CM

## 2013-09-12 MED ORDER — HEPARIN SOD (PORK) LOCK FLUSH 100 UNIT/ML IV SOLN
500.0000 [IU] | Freq: Once | INTRAVENOUS | Status: AC
  Administered 2013-09-12: 500 [IU] via INTRAVENOUS
  Filled 2013-09-12: qty 5

## 2013-09-12 MED ORDER — MORPHINE SULFATE 15 MG PO TABS: 15.0000 mg | ORAL_TABLET | ORAL | Status: DC | PRN

## 2013-09-12 MED ORDER — SODIUM CHLORIDE 0.9 % IJ SOLN
10.0000 mL | INTRAMUSCULAR | Status: DC | PRN
  Administered 2013-09-12: 10 mL via INTRAVENOUS
  Filled 2013-09-12: qty 10

## 2013-09-12 NOTE — Patient Instructions (Signed)

## 2013-09-12 NOTE — Progress Notes (Signed)
OFFICE PROGRESS NOTE  Interval history:  [name] [name] returns for followup of metastatic breast cancer. She reports overall she is feeling well. Back pain is well-controlled with MSIR. She takes pain medication about every 4 hours. She has occasional constipation. She has noted less acid reflux symptoms. Dyspnea on exertion is stable. She continues to use an inhaler. She has a good appetite.   Objective: Filed Vitals:   09/12/13 0903  BP: 153/84  Pulse: 74  Temp: 97.5 F (36.4 C)  Resp: 18   Oropharynx is without thrush or ulceration. Tiny left scalene node. Question tiny right scalene node. No other cervical, supraclavicular or axillary lymph nodes. Lungs are clear. Breath sounds diminished at the left lower one half. No respiratory distress. Regular cardiac rhythm. Port-A-Cath site is without erythema. Abdomen is soft and nontender. No hepatomegaly. No leg edema. Calves nontender. Status post left mastectomy. 3 cm area of thickening/induration at the left mastectomy scar.   Lab Results: Lab Results  Component Value Date   WBC 4.5 05/24/2013   HGB 10.0* 05/24/2013   HCT 31.4* 05/24/2013   MCV 85.8 05/24/2013   PLT 239 05/24/2013   NEUTROABS 2.6 05/24/2013    Chemistry:    Chemistry      Component Value Date/Time   NA 138 02/28/2013 0440   NA 139 02/23/2013 1110   K 4.0 02/28/2013 0440   K 3.6 02/23/2013 1110   CL 104 02/28/2013 0440   CL 107 10/14/2012 1047   CO2 25 02/28/2013 0440   CO2 27 02/23/2013 1110   BUN 8 02/28/2013 0440   BUN 9.5 02/23/2013 1110   CREATININE 0.81 02/28/2013 0440   CREATININE 0.8 02/23/2013 1110   CREATININE 0.89 10/23/2010 1514      Component Value Date/Time   CALCIUM 8.7 02/28/2013 0440   CALCIUM 9.1 02/23/2013 1110   ALKPHOS 56 02/26/2013 1706   ALKPHOS 65 02/23/2013 1110   AST 13 02/26/2013 1706   AST 12 02/23/2013 1110   ALT 8 02/26/2013 1706   ALT 12 02/23/2013 1110   BILITOT 0.3 02/26/2013 1706   BILITOT 0.34 02/23/2013 1110       Studies/Results: No  results found.  Medications: I have reviewed the patient's current medications.  Assessment/Plan: 1.Metastatic breast cancer - initially diagnosed with breast cancer in 1996 status post left mastectomy and axillary lymph node dissection. She developed a chest wall recurrence in 2001 and completed radiation. (See previous notes for subsequent treatment history).  2. Ulcerated left chest lesion, likely a metastatic lesion. The area of induration appears stable.  3. Severe right low back/buttock pain radiating to the left leg October 2010 with MRI on April 06, 2009 revealing a disk protrusion at L5-S1 displacing the right S1 nerve root. She was evaluated by Dr. Annette Stable. The pain has resolved.  4. Hospitalization December 1 through May 31, 2009 with nausea, vomiting and abdominal pain - resolved.  5. 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  6. Pain at the right back/ iliac region October 25, 2008.  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  8. Status post left knee replacement.  9. Chronic edema of the left lower leg.  10. "Migraines". She takes Imitrex as needed.  11. Falls and balance problems occurring over the past year. She was referred to neurology. No difficulty with ambulation today.  12. Right breast with area of firmness on exam March 2013 -negative right breast mammogram and  ultrasound 09/15/2011. Negative right breast mammogram 09/16/2012.  13. Pain at upper back. Resolved following the laminectomy procedure. The pain increased. CT and MRI confirmed progressive disease. She continues morphine as needed.  14. Unsteady gait on 04/14/2012-much improved.  15. Nausea/vomiting following Faslodex 07/26/2012.  16. History of weight loss-likely secondary to progression of the metastatic breast cancer.  17. Persistent nausea and diarrhea following initiation of Taxol/Herceptin/pertuzumab 12/29/2012. The nausea appears to be  related to a brain metastasis detected on a CT 01/19/2013  18. Admission with severe anemia and a gastric ulcer 02/09/2013, biopsy positive for H. Pylori. Hemoglobin is stable to improved.  19. Left upper extremity DVT documented during the 02/09/2013 hospital admission-not treated.  20. Status post SRS to a left parietal brain lesion on 02/03/2013. Status post SRS to a left temporal brain lesion 06/21/2014  Dispositon-her clinical status appears unchanged. She continues to decline systemic therapy. We will continue to follow on an observation approach. She will return for a followup visit and Port-A-Cath flush in 6 weeks. She will contact the office between visits with any problems.  Plan reviewed with Dr. Benay Spice.   [name] ANP/GNP-BC

## 2013-09-12 NOTE — Telephone Encounter (Signed)
Gave pt appt for Md on April 2015

## 2013-09-16 ENCOUNTER — Encounter: Payer: Self-pay | Admitting: *Deleted

## 2013-09-16 NOTE — Progress Notes (Signed)
Received fax from [facility] [place] requesting referral from MD office be completed due to new requirement from [facility]. Forwarded to managed care department.

## 2013-09-20 ENCOUNTER — Telehealth: Payer: Self-pay | Admitting: *Deleted

## 2013-09-20 ENCOUNTER — Other Ambulatory Visit: Payer: Medicare HMO

## 2013-09-20 NOTE — Telephone Encounter (Signed)
Patient called to follow up on her referral to [facility] for mastectomy supplies. Will make managed care aware she has called.

## 2013-09-22 ENCOUNTER — Ambulatory Visit: Payer: Medicare HMO

## 2013-09-22 ENCOUNTER — Ambulatory Visit
Admission: RE | Admit: 2013-09-22 | Discharge: 2013-09-22 | Disposition: A | Discharge status: Home / Self Care | Payer: Medicare HMO | Source: Ambulatory Visit | Attending: Radiation Oncology | Admitting: Radiation Oncology

## 2013-09-22 ENCOUNTER — Ambulatory Visit
Admission: RE | Admit: 2013-09-22 | Discharge: 2013-09-22 | Disposition: A | Discharge status: Home / Self Care | Payer: Commercial Managed Care - HMO | Source: Ambulatory Visit | Attending: Radiation Oncology | Admitting: Radiation Oncology

## 2013-09-22 DIAGNOSIS — C7931 Secondary malignant neoplasm of brain: Secondary | ICD-10-CM | POA: Diagnosis present

## 2013-09-22 DIAGNOSIS — C7949 Secondary malignant neoplasm of other parts of nervous system: Principal | ICD-10-CM

## 2013-09-22 LAB — BUN AND CREATININE (CC13)
BUN: 15.2 mg/dL (ref 7.0–26.0)
CREATININE: 0.9 mg/dL (ref 0.6–1.1)

## 2013-09-22 MED ORDER — GADOBENATE DIMEGLUMINE 529 MG/ML IV SOLN
15.0000 mL | Freq: Once | INTRAVENOUS | Status: AC | PRN
  Administered 2013-09-22: 15 mL via INTRAVENOUS

## 2013-10-06 ENCOUNTER — Other Ambulatory Visit: Payer: Self-pay | Admitting: Radiation Therapy

## 2013-10-06 DIAGNOSIS — C7949 Secondary malignant neoplasm of other parts of nervous system: Principal | ICD-10-CM

## 2013-10-06 DIAGNOSIS — C7931 Secondary malignant neoplasm of brain: Secondary | ICD-10-CM

## 2013-10-07 ENCOUNTER — Ambulatory Visit
Admission: RE | Admit: 2013-10-07 | Discharge: 2013-10-07 | Disposition: A | Discharge status: Home / Self Care | Payer: Commercial Managed Care - HMO | Source: Ambulatory Visit

## 2013-10-07 DIAGNOSIS — Z9012 Acquired absence of left breast and nipple: Secondary | ICD-10-CM

## 2013-10-07 DIAGNOSIS — Z1231 Encounter for screening mammogram for malignant neoplasm of breast: Secondary | ICD-10-CM

## 2013-10-20 ENCOUNTER — Encounter (HOSPITAL_COMMUNITY): Payer: Self-pay | Admitting: Emergency Medicine

## 2013-10-20 ENCOUNTER — Telehealth: Payer: Self-pay | Admitting: *Deleted

## 2013-10-20 ENCOUNTER — Emergency Department (HOSPITAL_COMMUNITY)
Admission: EM | Admit: 2013-10-20 | Discharge: 2013-10-20 | Disposition: A | Discharge status: Home / Self Care | Payer: Medicare HMO | Attending: Emergency Medicine | Admitting: Emergency Medicine

## 2013-10-20 ENCOUNTER — Emergency Department (HOSPITAL_COMMUNITY): Payer: Medicare HMO

## 2013-10-20 DIAGNOSIS — R52 Pain, unspecified: Secondary | ICD-10-CM | POA: Diagnosis not present

## 2013-10-20 DIAGNOSIS — Z85830 Personal history of malignant neoplasm of bone: Secondary | ICD-10-CM | POA: Insufficient documentation

## 2013-10-20 DIAGNOSIS — Z85841 Personal history of malignant neoplasm of brain: Secondary | ICD-10-CM | POA: Diagnosis not present

## 2013-10-20 DIAGNOSIS — J45909 Unspecified asthma, uncomplicated: Secondary | ICD-10-CM | POA: Diagnosis not present

## 2013-10-20 DIAGNOSIS — Z862 Personal history of diseases of the blood and blood-forming organs and certain disorders involving the immune mechanism: Secondary | ICD-10-CM | POA: Insufficient documentation

## 2013-10-20 DIAGNOSIS — Z87891 Personal history of nicotine dependence: Secondary | ICD-10-CM | POA: Insufficient documentation

## 2013-10-20 DIAGNOSIS — Z79899 Other long term (current) drug therapy: Secondary | ICD-10-CM | POA: Insufficient documentation

## 2013-10-20 DIAGNOSIS — Z8639 Personal history of other endocrine, nutritional and metabolic disease: Secondary | ICD-10-CM | POA: Insufficient documentation

## 2013-10-20 DIAGNOSIS — M546 Pain in thoracic spine: Secondary | ICD-10-CM | POA: Diagnosis present

## 2013-10-20 DIAGNOSIS — G8929 Other chronic pain: Secondary | ICD-10-CM | POA: Insufficient documentation

## 2013-10-20 DIAGNOSIS — I10 Essential (primary) hypertension: Secondary | ICD-10-CM | POA: Diagnosis not present

## 2013-10-20 DIAGNOSIS — Z9889 Other specified postprocedural states: Secondary | ICD-10-CM | POA: Diagnosis not present

## 2013-10-20 DIAGNOSIS — Z853 Personal history of malignant neoplasm of breast: Secondary | ICD-10-CM | POA: Diagnosis not present

## 2013-10-20 LAB — COMPREHENSIVE METABOLIC PANEL
ALT: 6 U/L (ref 0–35)
AST: 13 U/L (ref 0–37)
Albumin: 3.3 g/dL — ABNORMAL LOW (ref 3.5–5.2)
Alkaline Phosphatase: 85 U/L (ref 39–117)
BUN: 11 mg/dL (ref 6–23)
CALCIUM: 9.5 mg/dL (ref 8.4–10.5)
CO2: 26 meq/L (ref 19–32)
CREATININE: 0.86 mg/dL (ref 0.50–1.10)
Chloride: 100 mEq/L (ref 96–112)
GFR, EST AFRICAN AMERICAN: 78 mL/min — AB (ref >90)
GFR, EST NON AFRICAN AMERICAN: 67 mL/min — AB (ref >90)
GLUCOSE: 88 mg/dL (ref 70–99)
Potassium: 4.1 mEq/L (ref 3.7–5.3)
Sodium: 139 mEq/L (ref 137–147)
Total Bilirubin: 0.4 mg/dL (ref 0.3–1.2)
Total Protein: 7.5 g/dL (ref 6.0–8.3)

## 2013-10-20 LAB — CBC WITH DIFFERENTIAL/PLATELET
Basophils Absolute: 0 10*3/uL (ref 0.0–0.1)
Basophils Relative: 0 % (ref 0–1)
EOS ABS: 0.1 10*3/uL (ref 0.0–0.7)
EOS PCT: 1 % (ref 0–5)
HEMATOCRIT: 32.5 % — AB (ref 36.0–46.0)
Hemoglobin: 10.4 g/dL — ABNORMAL LOW (ref 12.0–15.0)
LYMPHS ABS: 0.9 10*3/uL (ref 0.7–4.0)
LYMPHS PCT: 23 % (ref 12–46)
MCH: 28 pg (ref 26.0–34.0)
MCHC: 32 g/dL (ref 30.0–36.0)
MCV: 87.6 fL (ref 78.0–100.0)
Monocytes Absolute: 0.5 10*3/uL (ref 0.1–1.0)
Monocytes Relative: 13 % — ABNORMAL HIGH (ref 3–12)
Neutro Abs: 2.6 10*3/uL (ref 1.7–7.7)
Neutrophils Relative %: 63 % (ref 43–77)
Platelets: 305 10*3/uL (ref 150–400)
RBC: 3.71 MIL/uL — ABNORMAL LOW (ref 3.87–5.11)
RDW: 14.3 % (ref 11.5–15.5)
WBC: 4.1 10*3/uL (ref 4.0–10.5)

## 2013-10-20 LAB — SEDIMENTATION RATE: Sed Rate: 72 mm/hr — ABNORMAL HIGH (ref 0–22)

## 2013-10-20 MED ORDER — HEPARIN SOD (PORK) LOCK FLUSH 100 UNIT/ML IV SOLN
500.0000 [IU] | Freq: Once | INTRAVENOUS | Status: AC
  Administered 2013-10-20: 500 [IU]
  Filled 2013-10-20: qty 5

## 2013-10-20 MED ORDER — HYDROMORPHONE HCL PF 1 MG/ML IJ SOLN
1.0000 mg | Freq: Once | INTRAMUSCULAR | Status: AC
Start: 2013-10-20 — End: 2013-10-20
  Administered 2013-10-20: 1 mg via INTRAVENOUS
  Filled 2013-10-20: qty 1

## 2013-10-20 MED ORDER — MORPHINE SULFATE 15 MG PO TABS: 15.0000 mg | ORAL_TABLET | ORAL | Status: DC | PRN

## 2013-10-20 NOTE — ED Provider Notes (Signed)
CSN: [id number]     Arrival date & time 10/20/13  0912 History   First MD Initiated Contact with Patient 10/20/13 0942     Chief Complaint  Patient presents with  . Back Pain     (Consider location/radiation/quality/duration/timing/severity/associated sxs/prior Treatment) Patient is a 68 y.o. female presenting with back pain. The history is provided by the patient. No language interpreter was used.  Back Pain Location:  Thoracic spine Quality:  Stabbing Radiates to:  Does not radiate Pain severity:  Severe Pain is:  Same all the time Onset quality:  Gradual (years, worse 2 days) Duration: years, worse 2 days. Timing:  Constant Progression:  Worsening Chronicity:  New Context: not emotional stress, not falling, not MCA, not MVA, not physical stress, not recent illness, not recent injury and not twisting   Relieved by: narcotics. Worsened by:  Ambulation, bending and movement Ineffective treatments:  None tried Associated symptoms: no abdominal pain, no abdominal swelling, no bladder incontinence, no bowel incontinence, no chest pain, no dysuria, no fever, no headaches, no leg pain, no numbness, no paresthesias, no pelvic pain, no tingling and no weakness   Risk factors: hx of cancer     Past Medical History  Diagnosis Date  . Cancer 1996    breast  . Hyperlipidemia   . Hypertension   . Asthma     best PEF=400  . 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 Chronic low back pain     MRI 2010 showed right sided disease with paracentral  disc protusion at L5-S1 which contacts and displaces the right S1 nerve root within the lateral recess  . Migraine headache     MRI 11/18/10: Mild progression of prominent white matter type changes which may be related to a small vessel disease and / or migraine headaches.Other causes for white matter type changes such as that secondaryto; vasculitis,  inflammatory process or demyelinating process feltto be secondary considerations.  . Leg swelling   . Neuralgia and neuritis   . Tenosynovitis     left, MRI 05/2003  . Herniated disc     h/o ruptured disk, laminectomy L4-L5, Dr. Carter  . Breast cancer 1996, 2001    f/b Dr. Sherill. s/p masectomy +radation. w/ metastatic disease, now on Femara.  . Tobacco abuse   . Hx of radiation therapy 06/21/02 -07/13/02    T1-T6  . Bone metastases     breast primary, T spine  . Shortness of breath     exertion  . Hx of migraines   . Hx of radiation therapy 04/21/12    T3-T4 spinal SRS  . Breast cancer metastasized to multiple sites 07/12/2012  . Brain cancer     new 4 cm brain met   Past Surgical History  Procedure Laterality Date  . Abdominal hysterectomy    . Cesarean section    . Mastectomy      left  . Laminectomy      L4-L5  . Joint replacement      Left Knee  . Back surgery    . Breast surgery    . Amputation  11/26/2011    Procedure: AMPUTATION DIGIT;  Surgeon: Arthur F Carter, MD;  Location: [facility] OR;  Service: Orthopedics;  Laterality: Bilateral;  PHALANGECTOMY  - Right fifth toe and left third toe  . Rotator cuff repair        pt does not remember year of surgery  . Thoracic laminectomy  04/01/12    T 2-4-metastatic tumor resection  . Esophagogastroduodenoscopy N/A 02/11/2013    Procedure: ESOPHAGOGASTRODUODENOSCOPY (EGD);  Surgeon: Wonda Horner, MD;  Location: [facility] ENDOSCOPY;  Service: Endoscopy;  Laterality: N/A;  . Colonoscopy N/A 02/11/2013    Procedure: COLONOSCOPY;  Surgeon: Wonda Horner, MD;  Location: [facility] ENDOSCOPY;  Service: Endoscopy;  Laterality: N/A;   Family History  Problem Relation Age of Onset  . Anesthesia problems Neg Hx   . Hypotension Neg Hx   . Malignant hyperthermia Neg Hx   . Pseudochol deficiency Neg Hx    History  Substance Use Topics  . Smoking status: Former Smoker -- 0.50 packs/day for 50 years    Types: Cigarettes    Quit date: 01/23/2013  .  Smokeless tobacco: Never Used     Comment: quit smoking 01/23/13  . Alcohol Use: No   OB History   Grav Para Term Preterm Abortions TAB SAB Ect Mult Living                 Review of Systems  Constitutional: Negative for fever, chills, diaphoresis, activity change, appetite change and fatigue.  HENT: Negative for congestion, facial swelling, rhinorrhea and sore throat.   Eyes: Negative for photophobia and discharge.  Respiratory: Negative for cough, chest tightness and shortness of breath.   Cardiovascular: Negative for chest pain, palpitations and leg swelling.  Gastrointestinal: Negative for nausea, vomiting, abdominal pain, diarrhea and bowel incontinence.  Endocrine: Negative for polydipsia and polyuria.  Genitourinary: Negative for bladder incontinence, dysuria, frequency, difficulty urinating and pelvic pain.  Musculoskeletal: Positive for back pain. Negative for arthralgias, neck pain and neck stiffness.  Skin: Negative for color change and wound.  Allergic/Immunologic: Negative for immunocompromised state.  Neurological: Negative for tingling, facial asymmetry, weakness, numbness, headaches and paresthesias.  Hematological: Does not bruise/bleed easily.  Psychiatric/Behavioral: Negative for confusion and agitation.      Allergies  Banana  Home Medications   Prior to Admission medications   Medication Sig Start Date End Date Taking? Authorizing Provider  albuterol (VENTOLIN HFA) 108 (90 BASE) MCG/ACT inhaler Inhale 2 puffs into the lungs every 6 (six) hours as needed for wheezing or shortness of breath.   Yes Historical Provider, MD  morphine (MSIR) 15 MG tablet Take 15 mg by mouth every 4 (four) hours as needed for severe pain.   Yes Historical Provider, MD  pantoprazole (PROTONIX) 20 MG tablet Take 20 mg by mouth 2 (two) times daily.   Yes Historical Provider, MD   BP 178/93  Pulse 93  Temp(Src) 98.4 F (36.9 C)  Resp 20  SpO2 95% Physical Exam  Constitutional:  She is oriented to person, place, and time. She appears well-developed and well-nourished. No distress.  HENT:  Head: Normocephalic and atraumatic.  Mouth/Throat: No oropharyngeal exudate.  Eyes: Pupils are equal, round, and reactive to light.  Neck: Normal range of motion. Neck supple.  Cardiovascular: Normal rate, regular rhythm and normal heart sounds.  Exam reveals no gallop and no friction rub.   No murmur heard. Pulmonary/Chest: Effort normal and breath sounds normal. No respiratory distress. She has no wheezes. She has no rales.  Abdominal: Soft. Bowel sounds are normal. She exhibits no distension and no mass. There is no tenderness. There is no rebound and no guarding.  Musculoskeletal: Normal range of motion. She exhibits no edema and no tenderness.       Back:  Neurological:  She is alert and oriented to person, place, and time.  Skin: Skin is warm and dry.  Psychiatric: She has a normal mood and affect.    ED Course  Procedures (including critical care time) Labs Review Labs Reviewed  CBC WITH DIFFERENTIAL  COMPREHENSIVE METABOLIC PANEL    Imaging Review No results found.   EKG Interpretation None      MDM   Final diagnoses:  None    Pt is a 68 y.o. female with Pmhx as above including inferior T2, T3, and superior T4 thoracic laminectomy, resection of epidural metastatic tumor with microdissection by Dr. Sherwood Gambler, 10/3/'13,  who presents with acute worsening of her chronic thoracic back pain for 2 days. She has been taking increased doses of her morphine and ran out this morning. Denies numbness, weakness, bowel/bladder incontinence, fever, falls. She states pain is at location of prior surgery, but when pointing, pain is below her surgical scar on thoracic back.  IV dilaudid given with improvement of pain. CT T spine ordered, with increased lucency around upper thoracic pedicle screws indicating loosening or infection along pedicle screws. [facility]  consulted  Spoke w/ Dr. Sherwood Gambler who does not believe this represents infection.  I agree this is unlikely given nml WBC, lack of fever. Will d/c home w/ refill of morphine. She will call Dr. Ammie Dalton for outpt f/u.        Neta Ehlers, MD 10/20/13 [phone]

## 2013-10-20 NOTE — ED Notes (Signed)
Pt reports chronic mid back pain. Pt tearful at present time.

## 2013-10-20 NOTE — Telephone Encounter (Signed)
INCREASED PAIN AT A SCALE OF TEN STARTED TWO DAYS AGO. SHE HAS BEEN INCREASING HER PAIN PILLS TO FOUR TWICE A DAY WITH LITTLE RELIEF. PT. TOOK HER LAST TWO PAIN PILLS AT 4:00AM. SHE HAS NOT CONTACTED HER SURGEON, DR.NUDELMAN. NOTIFIED DR.SHERRILL. VERBAL ORDER AND READ BACK TO DR.[place]. NEEDS TO GO TO THE EMERGENCY ROOM TO BE EVALUATED. NOTIFIED PT. OF DR.SHERRILL'S INSTRUCTIONS. SHE VOICES UNDERSTANDING.

## 2013-10-20 NOTE — ED Notes (Signed)
Two IV attempts unsuccessful. Able to draw blood but IV infiltrate with flush post lab draw.

## 2013-10-20 NOTE — ED Notes (Signed)
C/o back pain x 2 days; history of same

## 2013-10-20 NOTE — Telephone Encounter (Signed)
Call from pt reporting she's been discharged from ED. Was given a few pain pills to get through the weekend. Asking if she can be worked in for office visit today or tomorrow. Also requesting refill on pain meds. Instructed her to call Dr. Donnella Bi office to be worked in for evaluation of pain based on what the scan showed. Pt voiced understanding.

## 2013-10-20 NOTE — Discharge Instructions (Signed)
Back Pain, Adult Low back pain is very common. About 1 in 5 people have back pain.The cause of low back pain is rarely dangerous. The pain often gets better over time.About half of people with a sudden onset of back pain feel better in just 2 weeks. About 8 in 10 people feel better by 6 weeks.  CAUSES Some common causes of back pain include:  Strain of the muscles or ligaments supporting the spine.  Wear and tear (degeneration) of the spinal discs.  Arthritis.  Direct injury to the back. DIAGNOSIS Most of the time, the direct cause of low back pain is not known.However, back pain can be treated effectively even when the exact cause of the pain is unknown.Answering your caregiver's questions about your overall health and symptoms is one of the most accurate ways to make sure the cause of your pain is not dangerous. If your caregiver needs more information, he or she may order lab work or imaging tests (X-rays or MRIs).However, even if imaging tests show changes in your back, this usually does not require surgery. HOME CARE INSTRUCTIONS For many people, back pain returns.Since low back pain is rarely dangerous, it is often a condition that people can learn to manageon their own.   Remain active. It is stressful on the back to sit or stand in one place. Do not sit, drive, or stand in one place for more than 30 minutes at a time. Take short walks on level surfaces as soon as pain allows.Try to increase the length of time you walk each day.  Do not stay in bed.Resting more than 1 or 2 days can delay your recovery.  Do not avoid exercise or work.Your body is made to move.It is not dangerous to be active, even though your back may hurt.Your back will likely heal faster if you return to being active before your pain is gone.  Pay attention to your body when you bend and lift. Many people have less discomfortwhen lifting if they bend their knees, keep the load close to their bodies,and  avoid twisting. Often, the most comfortable positions are those that put less stress on your recovering back.  Find a comfortable position to sleep. Use a firm mattress and lie on your side with your knees slightly bent. If you lie on your back, put a pillow under your knees.  Only take over-the-counter or prescription medicines as directed by your caregiver. Over-the-counter medicines to reduce pain and inflammation are often the most helpful.Your caregiver may prescribe muscle relaxant drugs.These medicines help dull your pain so you can more quickly return to your normal activities and healthy exercise.  Put ice on the injured area.  Put ice in a plastic bag.  Place a towel between your skin and the bag.  Leave the ice on for 15-20 minutes, 03-04 times a day for the first 2 to 3 days. After that, ice and heat may be alternated to reduce pain and spasms.  Ask your caregiver about trying back exercises and gentle massage. This may be of some benefit.  Avoid feeling anxious or stressed.Stress increases muscle tension and can worsen back pain.It is important to recognize when you are anxious or stressed and learn ways to manage it.Exercise is a great option. SEEK MEDICAL CARE IF:  You have pain that is not relieved with rest or medicine.  You have pain that does not improve in 1 week.  You have new symptoms.  You are generally not feeling well. SEEK   IMMEDIATE MEDICAL CARE IF:   You have pain that radiates from your back into your legs.  You develop new bowel or bladder control problems.  You have unusual weakness or numbness in your arms or legs.  You develop nausea or vomiting.  You develop abdominal pain.  You feel faint. Document Released: 06/16/2005 Document Revised: 12/16/2011 Document Reviewed: 11/04/2010 Exit[employer] Patient Information 2014 [employer].  

## 2013-10-21 ENCOUNTER — Ambulatory Visit (HOSPITAL_BASED_OUTPATIENT_CLINIC_OR_DEPARTMENT_OTHER): Payer: Commercial Managed Care - HMO | Admitting: Oncology

## 2013-10-21 ENCOUNTER — Telehealth: Payer: Self-pay | Admitting: Oncology

## 2013-10-21 ENCOUNTER — Telehealth: Payer: Self-pay | Admitting: *Deleted

## 2013-10-21 ENCOUNTER — Other Ambulatory Visit: Payer: Self-pay | Admitting: *Deleted

## 2013-10-21 VITALS — BP 155/72 | HR 75 | Temp 98.0°F | Resp 20 | Ht 68.0 in | Wt 157.2 lb

## 2013-10-21 DIAGNOSIS — C50919 Malignant neoplasm of unspecified site of unspecified female breast: Secondary | ICD-10-CM

## 2013-10-21 DIAGNOSIS — C7951 Secondary malignant neoplasm of bone: Secondary | ICD-10-CM

## 2013-10-21 DIAGNOSIS — C7931 Secondary malignant neoplasm of brain: Secondary | ICD-10-CM

## 2013-10-21 DIAGNOSIS — M549 Dorsalgia, unspecified: Secondary | ICD-10-CM

## 2013-10-21 DIAGNOSIS — C782 Secondary malignant neoplasm of pleura: Secondary | ICD-10-CM

## 2013-10-21 DIAGNOSIS — C7949 Secondary malignant neoplasm of other parts of nervous system: Secondary | ICD-10-CM

## 2013-10-21 DIAGNOSIS — C7952 Secondary malignant neoplasm of bone marrow: Secondary | ICD-10-CM

## 2013-10-21 MED ORDER — MORPHINE SULFATE 15 MG PO TABS: 15.0000 mg | ORAL_TABLET | ORAL | Status: DC | PRN

## 2013-10-21 NOTE — Progress Notes (Signed)
[facility] OFFICE PROGRESS NOTE   Diagnosis: Breast cancer  INTERVAL HISTORY:   She returns prior to a scheduled visit. She reports running out of pain medication 2 days ago and developed severe back pain. She was seen in the emergency room. She reports the pain is better today. No new neurologic symptoms. The left anterior chest wall lesion is healing. Good appetite.  Objective:  Vital signs in last 24 hours:  Blood pressure 155/72, pulse 75, temperature 98 F (36.7 C), temperature source Oral, resp. rate 20, height 5\' 8"  (1.727 m), weight 157 lb 3.2 oz (71.305 kg).    HEENT: Neck without mass Lymphatics: 1 cm mobile right axillary node. No cervical, supraclavicular, or left axillary nodes Resp: Decreased breath sounds over the left chest, no respiratory distress Cardio: Regular rate and rhythm GI: No hepatomegaly Vascular: No leg edema Neuro: Alert and oriented  Skin: Status post left mastectomy. There is mild erythema and skin thickening over the left anterior chest wall with an approximate 3 cm area of nodular induration centrally. Musculoskeletal: Tender over the left mid to posterior chest wall, no mass  Portacath/PICC-without erythema  Lab Results:  Lab Results  Component Value Date   WBC 4.1 10/20/2013   HGB 10.4* 10/20/2013   HCT 32.5* 10/20/2013   MCV 87.6 10/20/2013   PLT 305 10/20/2013   NEUTROABS 2.6 10/20/2013     Imaging:  Ct Thoracic Spine Wo Contrast  10/20/2013   CLINICAL DATA:  Thoracic back pain  EXAM: CT THORACIC SPINE WITHOUT CONTRAST  TECHNIQUE: Multidetector CT imaging of the thoracic spine was performed without intravenous contrast administration. Multiplanar CT image reconstructions were also generated.  COMPARISON:  06/09/2013  FINDINGS: Metastatic disease involving T2, T3, T4, and T5, with overall similar morphology of the collapsed T3 and T4 vertebra. Increased irregularity and fragmentation of the superior half of the T4 vertebra  is present to the overall appearance of posterior bony retropulsion and the overall contour is similar to prior MRI. There has been posterior decompression at T3 and T4 with posterolateral rod and pedicle screw fixation spanning is vertebra.  The left T1 pedicle screw captures the superior endplate, and there is some subtle lucency around the right T1 pedicle screw distally.  At T2-3, there is lucency along the left superior endplate and the left pedicle screw is in the intervertebral region likely related to endplate erosion.  There is lucency potentially representing loosening or infection, or conceivably than tumor, surrounding the pedicle screws at T5. Mild lucency around the pedicle screws at T6. These lucencies are increased in size compared to 06/09/13 CT scan.  There bony destructive findings in the T3 vertebra bilaterally and in both pedicles and transverse processes at T3. Probable tumor in the left pars region at T4.  Today's CT exam is not ideal for assessing the spinal cord.  Extensive left mediastinal and pleural tumor with large left effusion. This appears similar to the prior thoracic spine CT. Lytic bony destructive findings in the left ninth rib posteriorly, similar to prior.  Paraseptal emphysema noted.  IMPRESSION: 1. Increased lucency around upper thoracic pedicle screws indicating loosening or infection along the pedicle screws. 2. Metastatic disease at T2, T3, T4, and T5, with overall similar morphology of the compressed T3 and T4 vertebra. 3. Lytic metastatic lesion of the left ninth rib. 4. Similar appearance of extensive mediastinal and pleural tumor on the left with large left effusion. 5. Paraseptal emphysema.   Electronically Signed   By:  Sherryl Barters M.D.   On: 10/20/2013 12:58    Medications: I have reviewed the patient's current medications.  Assessment/Plan: 1.Metastatic breast cancer - initially diagnosed with breast cancer in 1996 status post left mastectomy and axillary  lymph node dissection. She developed a chest wall recurrence in 2001 and completed radiation. (See previous notes for subsequent treatment history).  2. Ulcerated left chest lesion, likely a metastatic lesion. The area of induration appears stable.  3. Severe right low back/buttock pain radiating to the left leg October 2010 with MRI on April 06, 2009 revealing a disk protrusion at L5-S1 displacing the right S1 nerve root. She was evaluated by Dr. Annette Stable. The pain has resolved.  4. Hospitalization December 1 through May 31, 2009 with nausea, vomiting and abdominal pain - resolved.  5. 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  6. Pain at the right back/ iliac region October 25, 2008.  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  8. Status post left knee replacement.  9. Chronic edema of the left lower leg.  10. "Migraines". She takes Imitrex as needed.  11. Falls and balance problems occurring over the past year. She was referred to neurology. No difficulty with ambulation today.  12. Right breast with area of firmness on exam March 2013 -negative right breast mammogram and ultrasound 09/15/2011. Negative right breast mammogram 09/16/2012.  13. Pain at upper back. Resolved following the laminectomy procedure. The pain increased. CT and MRI confirmed progressive disease. She continues morphine as needed.  14. Unsteady gait on 04/14/2012-much improved.  15. Nausea/vomiting following Faslodex 07/26/2012.  16. History of weight loss-likely secondary to progression of the metastatic breast cancer.  17. Persistent nausea and diarrhea following initiation of Taxol/Herceptin/pertuzumab 12/29/2012. 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 Admission with severe anemia and a gastric ulcer 02/09/2013, biopsy positive for H. Pylori.  19. Left upper extremity DVT documented during the 02/09/2013  hospital admission-not treated.  20. Status post SRS to a left parietal brain lesion on 02/03/2013. Status post SRS to a left temporal brain lesion 06/21/2014  21. Severe pain at the upper back 10/20/2013-a CT confirmed persistent metastatic disease involving the thoracic spine, left posterior ninth rib, and left pleura   Disposition:  She developed increased pain 2 days ago after running out of MSIR. She understands the pain is likely related to metastatic breast cancer. We refilled the [facility] prescription today. The tenderness at the left posterior chest wall is most likely secondary to a rib metastasis.  I recommended she resume systemic therapy with Herceptin. I also recommend the addition of chemotherapy to increase the chance of obtaining a clinical response. I explained the high chance of obtaining a clinical remission with a Herceptin based regimen. Ms. Flynt declines systemic therapy for now. She agrees to a return office visit in one month. She will contact us in the interim for new symptoms.  Ladell Pier, MD  10/21/2013  1:55 PM

## 2013-10-21 NOTE — Telephone Encounter (Signed)
Call from pt requesting refill on pain meds. She is taking MSIR 45 mg QAM 15 mg mid afternoon and 45 mg QPM. Pt asking if she can be prescribed a higher strength med.  Reviewed with Dr. Benay Spice: Work in today to be seen. Called pt with instructions to come in for 1200 visit. She will try to arrange transportation. Pt returned call, she will be here at 19.

## 2013-10-21 NOTE — Telephone Encounter (Signed)
s.w. pt and advised on todays appt....pt ok and already aaware

## 2013-10-24 ENCOUNTER — Ambulatory Visit: Payer: Medicare HMO | Admitting: Oncology

## 2013-11-02 ENCOUNTER — Telehealth: Payer: Self-pay | Admitting: *Deleted

## 2013-11-02 ENCOUNTER — Telehealth: Payer: Self-pay | Admitting: Oncology

## 2013-11-02 NOTE — Telephone Encounter (Signed)
VM requesting her records from March 2014 to March 2015, and reports needing these soon. Request forwarded to HIM department. Also adds that Dr. Benay Spice or his nurse needs to call [facility] as soon as possible-was not able to verbalize why. Forwarded this request to managed care.

## 2013-11-02 NOTE — Telephone Encounter (Signed)
Mail medical records to patient.

## 2013-11-08 ENCOUNTER — Encounter: Payer: Medicare HMO | Admitting: Internal Medicine

## 2013-11-09 ENCOUNTER — Encounter: Payer: Medicare HMO | Admitting: Internal Medicine

## 2013-11-09 ENCOUNTER — Ambulatory Visit (INDEPENDENT_AMBULATORY_CARE_PROVIDER_SITE_OTHER): Payer: Commercial Managed Care - HMO | Admitting: Internal Medicine

## 2013-11-09 ENCOUNTER — Encounter: Payer: Self-pay | Admitting: Internal Medicine

## 2013-11-09 VITALS — BP 138/75 | HR 73 | Temp 97.2°F | Ht 68.0 in | Wt 157.5 lb

## 2013-11-09 DIAGNOSIS — C7951 Secondary malignant neoplasm of bone: Secondary | ICD-10-CM

## 2013-11-09 DIAGNOSIS — C7952 Secondary malignant neoplasm of bone marrow: Secondary | ICD-10-CM

## 2013-11-09 DIAGNOSIS — C50919 Malignant neoplasm of unspecified site of unspecified female breast: Secondary | ICD-10-CM

## 2013-11-09 DIAGNOSIS — C7949 Secondary malignant neoplasm of other parts of nervous system: Secondary | ICD-10-CM

## 2013-11-09 DIAGNOSIS — C7931 Secondary malignant neoplasm of brain: Secondary | ICD-10-CM

## 2013-11-09 DIAGNOSIS — I10 Essential (primary) hypertension: Secondary | ICD-10-CM

## 2013-11-09 NOTE — Patient Instructions (Signed)
General Instructions:  1. Please schedule a follow up appointment for 4-6 months.   2. Please take all medications as prescribed. PLEASE FOLLOW UP CLOSELY w/ DR. SHERRILL.  3. If you have worsening of your symptoms or new symptoms arise, please call the clinic ([phone]), or go to the ER immediately if symptoms are severe.  You have done a great job in taking all your medications. I appreciate it very much. Please continue doing that.   Please bring your medicines with you each time you come to clinic.  Medicines may include prescription medications, over-the-counter medications, herbal remedies, eye drops, vitamins, or other pills.   Progress Toward Treatment Goals:  Treatment Goal 11/09/2013  Blood pressure at goal    Self Care Goals & Plans:  Self Care Goal 11/09/2013  Manage my medications take my medicines as prescribed; bring my medications to every visit; refill my medications on time  Monitor my health bring my glucose meter and log to each visit  Eat healthy foods drink diet soda or water instead of juice or soda; eat more vegetables; eat foods that are low in salt; eat baked foods instead of fried foods  Be physically active take a walk every day    No flowsheet data found.   Care Management & Community Referrals:  Referral 11/09/2013  Referrals made for care management support none needed  Referrals made to community resources none

## 2013-11-09 NOTE — Progress Notes (Signed)
Attending physician note: Presenting problems, physical findings, and medications, reviewed with resident physician Dr. Luanne Bras and I concur with his management. Murriel Hopper, M.D., [place]

## 2013-11-09 NOTE — Assessment & Plan Note (Signed)
Patient continuing to receive palliative radiation to brain mets, most recently 05/2013, follows w/ Dr. Tammi Klippel. Denies headache, nausea, or vomiting.  -Scheduled to have follow up MRI on 11/22/13, appt. w/ Dr. Tammi Klippel on 11/23/13.

## 2013-11-09 NOTE — Assessment & Plan Note (Signed)
Back pain is her biggest issue, however, she claims this is well controlled at this time. Patient was seen in the ED on 10/20/13 for severe back pain. CT was performed at that time, showed increased lucency around upper thoracic pedicle screws indicating loosening or infection along the pedicle screws, as well as metastatic disease at T2, T3, T4, and T5, with overall similar morphology of the compressed T3 and T4 vertebra. Also showed a lytic metastatic lesion of the left ninth rib. Followed up w/ Dr. Benay Spice the next day who made adjustments to her pain medications at that time. States her pain is 3-4/10 today and very manageable w/ her MSIR. Mild tenderness to palpation in the upper thoracic spine, over area of previous surgical site.  -Continue MSIR as prescribed by Dr. Benay Spice.

## 2013-11-09 NOTE — Assessment & Plan Note (Signed)
Continues to follow w/ Dr. Benay Spice. Still refusing systemic therapy for Breast CA. Patient appears significantly better than I have previously seen her. Has very few complaints.  -Continue MSIR 15 mg q4h prn for pain.  -To follow up w/ Dr. Gearldine Shown office on 11/17/13

## 2013-11-09 NOTE — Assessment & Plan Note (Signed)
BP Readings from Last 3 Encounters:  11/09/13 138/75  10/21/13 155/72  10/20/13 173/81    Lab Results  Component Value Date   NA 139 10/20/2013   K 4.1 10/20/2013   CREATININE 0.86 10/20/2013    Assessment: Blood pressure control: controlled Progress toward BP goal:  at goal Comments: BP well controlled at this time. Patient not on any BP medication.   Plan: Medications: None Educational resources provided:   Self management tools provided:   Other plans: Follow up in 4-6 months.

## 2013-11-09 NOTE — Progress Notes (Signed)
Patient ID: [name] [name], female   DOB: 02/08/1946, 67 y.o.   MRN: [id number]   Subjective:   Patient ID: [name] [name] female   DOB: 08/24/1945 67 y.o.   MRN: [id number]  HPI: [name] is a 67 y.o. female with PMHx significant for metastatic breast cancer undergoing palliative radiation for brain mets, HTN, HLD, and h/o PUD, presents today for clinic follow up. [name] has previously refused further systemic therapy for her metastatic Breast CA, but appears to be doing very well today. Patient seemed to be in very good spirits (has previously been very tearful during clinic visits) today, says her pain, mostly located in her thoracic spine, is well controlled w/ MSIR 15 mg q4h prn for pain.  The patient has also had issues w/ nausea, vomiting, and headaches in the distant past, likely related to brain metastases, however, she continues to receive palliative radiation w/ Dr. Manning and has not had any of these symptoms for quite some time.  [name] was additionally hospitalized in 01/2013 for a bleeding peptic ulcer, found to be H. Pylori positive, but has not had any issues w/ abdominal pain, nausea, melena, or hematochezia.  The patient states no further complaints, will follow up w/ Dr. Sherrill on 11/17/13.    Past Medical History  Diagnosis Date  . Cancer 1996    breast  . Hyperlipidemia   . Hypertension   . Asthma     best PEF=400  . 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 Chronic low back pain     MRI 2010 showed right sided disease with paracentral  disc protusion at L5-S1 which contacts and displaces the right S1 nerve root within the lateral recess  . Migraine headache     MRI 11/18/10: Mild progression of prominent white matter type changes which may be related to a small vessel disease and / or migraine headaches.Other causes for white matter type changes such as that  secondaryto; vasculitis, inflammatory process or demyelinating process feltto be secondary considerations.  . Leg swelling   . Neuralgia and neuritis   . Tenosynovitis     left, MRI 05/2003  . Herniated disc     h/o ruptured disk, laminectomy L4-L5, Dr. Carter  . Breast cancer 1996, 2001    f/b Dr. Sherill. s/p masectomy +radation. w/ metastatic disease, now on Femara.  . Tobacco abuse   . Hx of radiation therapy 06/21/02 -07/13/02    T1-T6  . Bone metastases     breast primary, T spine  . Shortness of breath     exertion  . Hx of migraines   . Hx of radiation therapy 04/21/12    T3-T4 spinal SRS  . Breast cancer metastasized to multiple sites 07/12/2012  . Brain cancer     new 4 cm brain met   Current Outpatient Prescriptions  Medication Sig Dispense Refill  . albuterol (VENTOLIN HFA) 108 (90 BASE) MCG/ACT inhaler Inhale 2 puffs into the lungs every 6 (six) hours as needed for wheezing or shortness of breath.      . morphine (MSIR) 15 MG tablet Take 1 tablet (15 mg total) by mouth every 4 (four) hours as needed for severe pain.  180 tablet  0  . pantoprazole (PROTONIX) 20 MG tablet Take 20 mg by mouth 2 (two) times daily.         No current facility-administered medications for this visit.   Family History  Problem Relation Age of Onset  . Anesthesia problems Neg Hx   . Hypotension Neg Hx   . Malignant hyperthermia Neg Hx   . Pseudochol deficiency Neg Hx    History   Social History  . Marital Status: Divorced    Spouse Name: N/A    Number of Children: N/A  . Years of Education: N/A   Social History Main Topics  . Smoking status: Former Smoker -- 0.50 packs/day for 50 years    Types: Cigarettes    Quit date: 01/23/2013  . Smokeless tobacco: Never Used     Comment: quit smoking 01/23/13  . Alcohol Use: No  . Drug Use: Yes    Special: Oxycodone  . Sexual Activity: Not Currently   Other Topics Concern  . None   Social History Narrative  . None    Review of  Systems: General: Denies fever, chills, diaphoresis, appetite change and fatigue.  Respiratory: Denies SOB, DOE, cough, chest tightness, and wheezing.   Cardiovascular: Denies chest pain and palpitations.  Gastrointestinal: Denies nausea, vomiting, abdominal pain, diarrhea, constipation, blood in stool and abdominal distention.  Genitourinary: Denies dysuria, urgency, frequency, hematuria, and flank pain. Endocrine: Denies hot or cold intolerance, polyuria, and polydipsia. Musculoskeletal: Positive for back pain. Denies myalgias, joint swelling, arthralgias and gait problem.  Skin: Denies pallor, rash and wounds.  Neurological: Denies dizziness, seizures, syncope, weakness, lightheadedness, numbness and headaches.  Psychiatric/Behavioral: Denies mood changes, confusion, nervousness, sleep disturbance and agitation.  Objective:   Physical Exam: Filed Vitals:   11/09/13 1353  BP: 138/75  Pulse: 73  Temp: 97.2 F (36.2 C)  TempSrc: Oral  Height: 5' 8" (1.727 m)  Weight: 157 lb 8 oz (71.442 kg)  SpO2: 97%   General: Vital signs reviewed.  Patient is a well-developed and well-nourished, in no acute distress and cooperative with exam.  Head: Normocephalic and atraumatic. Eyes: PERRL, EOMI, conjunctivae normal, No scleral icterus.  Neck: Supple, trachea midline, normal ROM, No JVD, masses, thyromegaly, or carotid bruit present.  Cardiovascular: RRR, S1 normal, S2 normal, no murmurs, gallops, or rubs. Pulmonary/Chest: Air entry equal bilaterally, no wheezes, rales, or rhonchi. Abdominal: Soft, non-tender, non-distended, BS +, no masses, organomegaly, or guarding present.  Musculoskeletal: Mild kyphosis on exam, tenderness to palpation over upper thoracic spine. No other joint deformities, erythema, or stiffness, ROM full and nontender. Extremities: No swelling or edema,  pulses symmetric and intact bilaterally. No cyanosis or clubbing. Neurological: A&O x3, Strength is normal and symmetric  bilaterally, cranial nerve II-XII are grossly intact, no focal motor deficit, sensory intact to light touch bilaterally.  Skin: Warm, dry and intact. No rashes or erythema. Psychiatric: Normal mood and affect. speech and behavior is normal. Cognition and memory are normal.   Assessment & Plan:   Please see problem based assessment and plan.

## 2013-11-17 ENCOUNTER — Ambulatory Visit: Payer: Medicare HMO

## 2013-11-17 ENCOUNTER — Telehealth: Payer: Self-pay | Admitting: Oncology

## 2013-11-17 ENCOUNTER — Ambulatory Visit (HOSPITAL_BASED_OUTPATIENT_CLINIC_OR_DEPARTMENT_OTHER): Payer: Medicaid Other | Admitting: Nurse Practitioner

## 2013-11-17 VITALS — BP 132/71 | HR 75 | Temp 98.8°F | Resp 18 | Ht 68.0 in | Wt 156.5 lb

## 2013-11-17 DIAGNOSIS — C782 Secondary malignant neoplasm of pleura: Secondary | ICD-10-CM

## 2013-11-17 DIAGNOSIS — C44599 Other specified malignant neoplasm of skin of other part of trunk: Secondary | ICD-10-CM

## 2013-11-17 DIAGNOSIS — M549 Dorsalgia, unspecified: Secondary | ICD-10-CM

## 2013-11-17 DIAGNOSIS — C44509 Unspecified malignant neoplasm of skin of other part of trunk: Secondary | ICD-10-CM

## 2013-11-17 DIAGNOSIS — C7949 Secondary malignant neoplasm of other parts of nervous system: Secondary | ICD-10-CM

## 2013-11-17 DIAGNOSIS — C50919 Malignant neoplasm of unspecified site of unspecified female breast: Secondary | ICD-10-CM

## 2013-11-17 DIAGNOSIS — Z95828 Presence of other vascular implants and grafts: Secondary | ICD-10-CM

## 2013-11-17 DIAGNOSIS — R110 Nausea: Secondary | ICD-10-CM

## 2013-11-17 DIAGNOSIS — I82409 Acute embolism and thrombosis of unspecified deep veins of unspecified lower extremity: Secondary | ICD-10-CM

## 2013-11-17 DIAGNOSIS — C7931 Secondary malignant neoplasm of brain: Secondary | ICD-10-CM

## 2013-11-17 DIAGNOSIS — C7951 Secondary malignant neoplasm of bone: Secondary | ICD-10-CM

## 2013-11-17 DIAGNOSIS — C7952 Secondary malignant neoplasm of bone marrow: Secondary | ICD-10-CM

## 2013-11-17 MED ORDER — HEPARIN SOD (PORK) LOCK FLUSH 100 UNIT/ML IV SOLN
500.0000 [IU] | Freq: Once | INTRAVENOUS | Status: AC
  Administered 2013-11-17: 500 [IU] via INTRAVENOUS
  Filled 2013-11-17: qty 5

## 2013-11-17 MED ORDER — SODIUM CHLORIDE 0.9 % IJ SOLN
10.0000 mL | INTRAMUSCULAR | Status: DC | PRN
  Administered 2013-11-17: 10 mL via INTRAVENOUS
  Filled 2013-11-17: qty 10

## 2013-11-17 MED ORDER — MORPHINE SULFATE 15 MG PO TABS: 15.0000 mg | ORAL_TABLET | ORAL | Status: DC | PRN

## 2013-11-17 NOTE — Patient Instructions (Signed)

## 2013-11-17 NOTE — Progress Notes (Addendum)
[place] OFFICE PROGRESS NOTE   Diagnosis:  Breast cancer.  INTERVAL HISTORY:   [name] [name] returns as scheduled. Back pain is stable overall.  "Some days better, some days worse". She is taking MSIR 3 times a day. She has noted pruritus over the left chest wall. She has occasional nausea. No constipation.  Objective:  Vital signs in last 24 hours:  Blood pressure 132/71, pulse 75, temperature 98.8 F (37.1 C), temperature source Oral, resp. rate 18, height 5\' 8"  (1.727 m), weight 156 lb 8 oz (70.988 kg), SpO2 92.00%.    HEENT: No thrush or ulcerations. Lymphatics: Small left scalene node. No palpable axillary lymph nodes. Resp: Lungs clear. Cardio: Regular cardiac rhythm. GI: Soft and nontender. No hepatomegaly. Vascular: No leg edema.  Skin: Status post left mastectomy. Persistent 3 cm area of nodular induration centrally. Erythema with skin thickening over the left anterior chest wall, question fluctuant in some areas.    Lab Results:  Lab Results  Component Value Date   WBC 4.1 10/20/2013   HGB 10.4* 10/20/2013   HCT 32.5* 10/20/2013   MCV 87.6 10/20/2013   PLT 305 10/20/2013   NEUTROABS 2.6 10/20/2013    Imaging:  No results found.  Medications: I have reviewed the patient's current medications.  Assessment/Plan: 1.Metastatic breast cancer - initially diagnosed with breast cancer in 1996 status post left mastectomy and axillary lymph node dissection. She developed a chest wall recurrence in 2001 and completed radiation. (See previous notes for subsequent treatment history).  2. Ulcerated left chest lesion, likely a metastatic lesion. The area of induration appears stable.  3. Severe right low back/buttock pain radiating to the left leg October 2010 with MRI on April 06, 2009 revealing a disk protrusion at L5-S1 displacing the right S1 nerve root. She was evaluated by Dr. Annette Stable. The pain has resolved.  4. Hospitalization December 1 through May 31, 2009 with nausea, vomiting and abdominal pain - resolved.  5. 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  6. Pain at the right back/ iliac region October 25, 2008.  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  8. Status post left knee replacement.  9. Chronic edema of the left lower leg.  10. "Migraines". She takes Imitrex as needed.  11. Falls and balance problems occurring over the past year. She was referred to neurology. No difficulty with ambulation today.  12. Right breast with area of firmness on exam March 2013 -negative right breast mammogram and ultrasound 09/15/2011. Negative right breast mammogram 09/16/2012.  13. Pain at upper back. Resolved following the laminectomy procedure. The pain increased. CT and MRI confirmed progressive disease. She continues morphine as needed.  14. Unsteady gait on 04/14/2012-much improved.  15. Nausea/vomiting following Faslodex 07/26/2012.  16. History of weight loss-likely secondary to progression of the metastatic breast cancer.  17. Persistent nausea and diarrhea following initiation of Taxol/Herceptin/pertuzumab 12/29/2012. 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 Admission with severe anemia and a gastric ulcer 02/09/2013, biopsy positive for H. Pylori.  19. Left upper extremity DVT documented during the 02/09/2013 hospital admission-not treated.  20. Status post SRS to a left parietal brain lesion on 02/03/2013. Status post SRS to a left temporal brain lesion 06/21/2014  21. Severe pain at the upper back 10/20/2013-a CT confirmed persistent metastatic disease involving the thoracic spine, left posterior ninth rib, and left pleura.  Disposition: Clinical status is unchanged. Back pain overall is stable. She continues MSIR as needed.  We discussed resuming systemic therapy with Herceptin with the addition of chemotherapy if she  is agreeable. She is not ready to resume systemic therapy.  She will return for a followup visit and port flush in 6 weeks. She will contact the office in the interim with any problems.  Patient seen with Dr. Benay Spice.    Owens Shark ANP/GNP-BC   11/17/2013  12:14 PM  This was a shared visit with [name]. Ms. Kimbler was examined. She appears to have progressive disease over the chest wall. She declined systemic therapy.  Julieanne Manson, M.D.

## 2013-11-17 NOTE — Telephone Encounter (Signed)
gv adn printed appt sched and avs for pt for July  °

## 2013-11-22 ENCOUNTER — Other Ambulatory Visit: Payer: Commercial Managed Care - HMO

## 2013-11-23 ENCOUNTER — Ambulatory Visit: Payer: Medicaid Other | Admitting: Radiation Oncology

## 2013-11-27 ENCOUNTER — Encounter (HOSPITAL_COMMUNITY): Payer: Self-pay | Admitting: Emergency Medicine

## 2013-11-27 ENCOUNTER — Emergency Department (HOSPITAL_COMMUNITY): Payer: Medicare HMO

## 2013-11-27 ENCOUNTER — Emergency Department (HOSPITAL_COMMUNITY)
Admission: EM | Admit: 2013-11-27 | Discharge: 2013-11-28 | Disposition: A | Discharge status: Home / Self Care | Payer: Medicare HMO | Attending: Emergency Medicine | Admitting: Emergency Medicine

## 2013-11-27 DIAGNOSIS — N390 Urinary tract infection, site not specified: Secondary | ICD-10-CM | POA: Diagnosis not present

## 2013-11-27 DIAGNOSIS — J45909 Unspecified asthma, uncomplicated: Secondary | ICD-10-CM | POA: Insufficient documentation

## 2013-11-27 DIAGNOSIS — G43909 Migraine, unspecified, not intractable, without status migrainosus: Secondary | ICD-10-CM | POA: Diagnosis not present

## 2013-11-27 DIAGNOSIS — R4182 Altered mental status, unspecified: Secondary | ICD-10-CM | POA: Diagnosis present

## 2013-11-27 DIAGNOSIS — C50919 Malignant neoplasm of unspecified site of unspecified female breast: Secondary | ICD-10-CM | POA: Diagnosis not present

## 2013-11-27 DIAGNOSIS — G039 Meningitis, unspecified: Secondary | ICD-10-CM | POA: Diagnosis not present

## 2013-11-27 DIAGNOSIS — Z923 Personal history of irradiation: Secondary | ICD-10-CM | POA: Insufficient documentation

## 2013-11-27 DIAGNOSIS — I10 Essential (primary) hypertension: Secondary | ICD-10-CM | POA: Insufficient documentation

## 2013-11-27 DIAGNOSIS — Z79899 Other long term (current) drug therapy: Secondary | ICD-10-CM | POA: Diagnosis not present

## 2013-11-27 DIAGNOSIS — M545 Low back pain, unspecified: Secondary | ICD-10-CM | POA: Insufficient documentation

## 2013-11-27 DIAGNOSIS — M659 Unspecified synovitis and tenosynovitis, unspecified site: Secondary | ICD-10-CM | POA: Insufficient documentation

## 2013-11-27 DIAGNOSIS — C719 Malignant neoplasm of brain, unspecified: Secondary | ICD-10-CM | POA: Insufficient documentation

## 2013-11-27 DIAGNOSIS — R0602 Shortness of breath: Secondary | ICD-10-CM | POA: Diagnosis not present

## 2013-11-27 DIAGNOSIS — Z853 Personal history of malignant neoplasm of breast: Secondary | ICD-10-CM | POA: Diagnosis not present

## 2013-11-27 DIAGNOSIS — E785 Hyperlipidemia, unspecified: Secondary | ICD-10-CM | POA: Insufficient documentation

## 2013-11-27 DIAGNOSIS — IMO0002 Reserved for concepts with insufficient information to code with codable children: Secondary | ICD-10-CM | POA: Diagnosis not present

## 2013-11-27 DIAGNOSIS — G8929 Other chronic pain: Secondary | ICD-10-CM | POA: Insufficient documentation

## 2013-11-27 DIAGNOSIS — M7989 Other specified soft tissue disorders: Secondary | ICD-10-CM | POA: Insufficient documentation

## 2013-11-27 DIAGNOSIS — Z87891 Personal history of nicotine dependence: Secondary | ICD-10-CM | POA: Insufficient documentation

## 2013-11-27 LAB — I-STAT CHEM 8, ED
BUN: 12 mg/dL (ref 6–23)
CREATININE: 0.8 mg/dL (ref 0.50–1.10)
Calcium, Ion: 1.21 mmol/L (ref 1.13–1.30)
Chloride: 100 mEq/L (ref 96–112)
Glucose, Bld: 129 mg/dL — ABNORMAL HIGH (ref 70–99)
HCT: 37 % (ref 36.0–46.0)
HEMOGLOBIN: 12.6 g/dL (ref 12.0–15.0)
Potassium: 4 mEq/L (ref 3.7–5.3)
SODIUM: 139 meq/L (ref 137–147)
TCO2: 27 mmol/L (ref 0–100)

## 2013-11-27 LAB — CBC WITH DIFFERENTIAL/PLATELET
Basophils Absolute: 0 10*3/uL (ref 0.0–0.1)
Basophils Relative: 0 % (ref 0–1)
EOS ABS: 0 10*3/uL (ref 0.0–0.7)
Eosinophils Relative: 0 % (ref 0–5)
HCT: 33.7 % — ABNORMAL LOW (ref 36.0–46.0)
Hemoglobin: 10.8 g/dL — ABNORMAL LOW (ref 12.0–15.0)
LYMPHS ABS: 0.5 10*3/uL — AB (ref 0.7–4.0)
LYMPHS PCT: 8 % — AB (ref 12–46)
MCH: 28.3 pg (ref 26.0–34.0)
MCHC: 32 g/dL (ref 30.0–36.0)
MCV: 88.5 fL (ref 78.0–100.0)
Monocytes Absolute: 0.2 10*3/uL (ref 0.1–1.0)
Monocytes Relative: 3 % (ref 3–12)
NEUTROS PCT: 89 % — AB (ref 43–77)
Neutro Abs: 5.6 10*3/uL (ref 1.7–7.7)
Platelets: 307 10*3/uL (ref 150–400)
RBC: 3.81 MIL/uL — AB (ref 3.87–5.11)
RDW: 14.3 % (ref 11.5–15.5)
WBC: 6.3 10*3/uL (ref 4.0–10.5)

## 2013-11-27 NOTE — ED Notes (Signed)
The pts behavior has been different since approx 1600 tonight.  She usually  Walks on her own  She has difficulty now and her speech is different according to her son.  She has been vomiting.  She has a porta -cath  Due to her having breast cancer.

## 2013-11-27 NOTE — ED Notes (Signed)
Pt continues to state that she is in pain, when asked where the pain is she states her back. (pt has chronic pain in back.) Pt repeats Can I have pain medicine, but will answer other questions when asked. Pt takes 15 mg of morphine at home and has not had her night time medications. Pt son at bedside and states that when he left her thsi afternoon she was fine and she spent time with her friend ("playing bingo") then when she returned she was not acting like herself. Pt was altered. Pt alert to self, place and events but is not alert to time stats it is 2005 but knows Mady Gemma is president.

## 2013-11-27 NOTE — ED Notes (Signed)
C/o of back pain

## 2013-11-27 NOTE — ED Notes (Signed)
C/o severe back pain

## 2013-11-28 ENCOUNTER — Other Ambulatory Visit: Payer: Self-pay

## 2013-11-28 ENCOUNTER — Encounter (HOSPITAL_COMMUNITY): Payer: Self-pay | Admitting: Emergency Medicine

## 2013-11-28 DIAGNOSIS — N390 Urinary tract infection, site not specified: Secondary | ICD-10-CM | POA: Diagnosis not present

## 2013-11-28 LAB — I-STAT TROPONIN, ED: Troponin i, poc: 0.01 ng/mL (ref 0.00–0.08)

## 2013-11-28 LAB — URINALYSIS, ROUTINE W REFLEX MICROSCOPIC
Bilirubin Urine: NEGATIVE
Glucose, UA: NEGATIVE mg/dL
HGB URINE DIPSTICK: NEGATIVE
Ketones, ur: 40 mg/dL — AB
Leukocytes, UA: NEGATIVE
Nitrite: NEGATIVE
Protein, ur: 30 mg/dL — AB
SPECIFIC GRAVITY, URINE: 1.019 (ref 1.005–1.030)
UROBILINOGEN UA: 1 mg/dL (ref 0.0–1.0)
pH: 5.5 (ref 5.0–8.0)

## 2013-11-28 LAB — URINE MICROSCOPIC-ADD ON

## 2013-11-28 MED ORDER — DEXAMETHASONE SODIUM PHOSPHATE 4 MG/ML IJ SOLN
4.0000 mg | Freq: Once | INTRAMUSCULAR | Status: AC
  Administered 2013-11-28: 4 mg via INTRAMUSCULAR
  Filled 2013-11-28: qty 1

## 2013-11-28 MED ORDER — CEPHALEXIN 500 MG PO CAPS: 500.0000 mg | ORAL_CAPSULE | Freq: Four times a day (QID) | ORAL | Status: DC

## 2013-11-28 MED ORDER — KETOROLAC TROMETHAMINE 60 MG/2ML IM SOLN
60.0000 mg | Freq: Once | INTRAMUSCULAR | Status: AC
  Administered 2013-11-28: 60 mg via INTRAMUSCULAR
  Filled 2013-11-28: qty 2

## 2013-11-28 NOTE — ED Notes (Signed)
Pt returned from x-ray/CT and is getting ready to walk and give a urine sample. Pt sleeping upon return for x-ray.

## 2013-11-28 NOTE — ED Notes (Signed)
Urine hat was placed on toilet but pt missed hat. Pt got into chair and bathroom with no assistance.

## 2013-11-28 NOTE — ED Notes (Signed)
Pt was able to get to bedside commode and use to give urine sample.

## 2013-11-28 NOTE — ED Provider Notes (Signed)
CSN: [id number]     Arrival date & time 11/27/13  2256 History   First MD Initiated Contact with Patient 11/27/13 2332     Chief Complaint  Patient presents with  . Altered Mental Status     (Consider location/radiation/quality/duration/timing/severity/associated sxs/prior Treatment) Patient is a 68 y.o. female presenting with altered mental status. The history is provided by the patient.  Altered Mental Status Presenting symptoms: confusion   Severity:  Moderate Most recent episode:  Today Episode history:  Single Timing:  Constant Progression:  Unchanged Chronicity:  Recurrent Context: not alcohol use   Associated symptoms: no abdominal pain, no agitation, no seizures, no slurred speech, no visual change and no weakness     Past Medical History  Diagnosis Date  . Cancer 1996    breast  . Hyperlipidemia   . Hypertension   . Asthma     best PEF=400  . 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 Chronic low back pain     MRI 2010 showed right sided disease with paracentral  disc protusion at L5-S1 which contacts and displaces the right S1 nerve root within the lateral recess  . Migraine headache     MRI 11/18/10: Mild progression of prominent white matter type changes which may be related to a small vessel disease and / or migraine headaches.Other causes for white matter type changes such as that secondaryto; vasculitis, inflammatory process or demyelinating process feltto be secondary considerations.  . Leg swelling   . Neuralgia and neuritis   . Tenosynovitis     left, MRI 05/2003  . Herniated disc     h/o ruptured disk, laminectomy L4-L5, Dr. Eulas Post  . Breast cancer 1996, 2001    f/b Dr. Learta Codding. s/p masectomy +radation. w/ metastatic disease, now on Femara.  . Tobacco abuse   . Hx of radiation therapy 06/21/02 -07/13/02    T1-T6  . Bone metastases     breast primary, T spine  .  Shortness of breath     exertion  . Hx of migraines   . Hx of radiation therapy 04/21/12    T3-T4 spinal SRS  . Breast cancer metastasized to multiple sites 07/12/2012  . Brain cancer     new 4 cm brain met   Past Surgical History  Procedure Laterality Date  . Abdominal hysterectomy    . Cesarean section    . Mastectomy      left  . Laminectomy      L4-L5  . Joint replacement      Left Knee  . Back surgery    . Breast surgery    . Amputation  11/26/2011    Procedure: AMPUTATION DIGIT;  Surgeon: Sharmon Revere, MD;  Location: [facility];  Service: Orthopedics;  Laterality: Bilateral;  PHALANGECTOMY  - Right fifth toe and left third toe  . Rotator cuff repair      pt does not remember year of surgery  . Thoracic laminectomy  04/01/12    T 2-4-metastatic tumor resection  . Esophagogastroduodenoscopy N/A 02/11/2013    Procedure: ESOPHAGOGASTRODUODENOSCOPY (EGD);  Surgeon: Wonda Horner, MD;  Location: [facility] ENDOSCOPY;  Service: Endoscopy;  Laterality: N/A;  . Colonoscopy N/A 02/11/2013    Procedure: COLONOSCOPY;  Surgeon: Wonda Horner, MD;  Location: [facility] ENDOSCOPY;  Service: Endoscopy;  Laterality: N/A;   Family History  Problem Relation  Age of Onset  . Anesthesia problems Neg Hx   . Hypotension Neg Hx   . Malignant hyperthermia Neg Hx   . Pseudochol deficiency Neg Hx    History  Substance Use Topics  . Smoking status: Former Smoker -- 0.50 packs/day for 50 years    Types: Cigarettes    Quit date: 01/23/2013  . Smokeless tobacco: Never Used     Comment: quit smoking 01/23/13  . Alcohol Use: No   OB History   Grav Para Term Preterm Abortions TAB SAB Ect Mult Living                 Review of Systems  Gastrointestinal: Negative for abdominal pain.  Neurological: Negative for seizures and weakness.  Psychiatric/Behavioral: Positive for confusion. Negative for agitation.  All other systems reviewed and are negative.     Allergies  Banana  Home Medications   Prior to  Admission medications   Medication Sig Start Date End Date Taking? Authorizing Provider  albuterol (VENTOLIN HFA) 108 (90 BASE) MCG/ACT inhaler Inhale 2 puffs into the lungs every 6 (six) hours as needed for wheezing or shortness of breath.   Yes Historical Provider, MD  morphine (MSIR) 15 MG tablet Take 1 tablet (15 mg total) by mouth every 4 (four) hours as needed for severe pain. 11/17/13  Yes Owens Shark, NP  pantoprazole (PROTONIX) 20 MG tablet Take 20 mg by mouth 2 (two) times daily.   Yes Historical Provider, MD   BP 168/76  Pulse 78  Temp(Src) 99.1 F (37.3 C) (Oral)  Resp 16  SpO2 93% Physical Exam  Constitutional: She appears well-developed and well-nourished. No distress.  HENT:  Head: Normocephalic and atraumatic.  Mouth/Throat: Oropharynx is clear and moist. No oropharyngeal exudate.  Eyes: Conjunctivae and EOM are normal. Pupils are equal, round, and reactive to light.  Neck: Normal range of motion. Neck supple.  Cardiovascular: Normal rate, regular rhythm and intact distal pulses.   Pulmonary/Chest: Effort normal and breath sounds normal. She has no wheezes. She has no rales.  Abdominal: Soft. Bowel sounds are normal. There is no tenderness. There is no rebound and no guarding.  Musculoskeletal: Normal range of motion. She exhibits no edema and no tenderness.  Neurological: She is alert. She has normal reflexes. She displays normal reflexes. No cranial nerve deficit. She exhibits normal muscle tone. Coordination normal.  Skin: Skin is warm and dry.  Psychiatric: She has a normal mood and affect.    ED Course  Procedures (including critical care time) Labs Review Labs Reviewed  CBC WITH DIFFERENTIAL - Abnormal; Notable for the following:    RBC 3.81 (*)    Hemoglobin 10.8 (*)    HCT 33.7 (*)    Neutrophils Relative % 89 (*)    Lymphocytes Relative 8 (*)    Lymphs Abs 0.5 (*)    All other components within normal limits  I-STAT CHEM 8, ED - Abnormal; Notable for  the following:    Glucose, Bld 129 (*)    All other components within normal limits  URINALYSIS, ROUTINE W REFLEX MICROSCOPIC  I-STAT TROPOININ, ED    Imaging Review Dg Chest 2 View  11/28/2013   CLINICAL DATA:  Altered mental status.  No chest complaints.  EXAM: CHEST  2 VIEW  COMPARISON:  02/26/2013  FINDINGS: Left hemidiaphragm is elevated as it was previously. There is a small left pleural effusion, which is new. Mild left basilar opacity is noted, likely atelectasis. There is mild  medial right lung base opacity that is also likely atelectasis. This is stable. Lungs are otherwise clear. No evidence of edema. No right pleural effusion. No pneumothorax.  Cardiac silhouette is mildly enlarged. Mediastinal structures are deviated to the right, unchanged. No convincing mediastinal or hilar mass. Right anterior chest wall Port-A-Cath is stable with its tip in the lower superior vena cava. There are changes from a cervical thoracic posterior fusion.  Multiple vascular clips tract along the left axilla. There are changes from the shoulder surgery.  IMPRESSION: No acute cardiopulmonary disease.  Small left pleural effusion, new from the prior study. No other change.   Electronically Signed   By: Lajean Manes M.D.   On: 11/28/2013 00:15   Ct Head Wo Contrast  11/28/2013   CLINICAL DATA:  Altered mental status.  EXAM: CT HEAD WITHOUT CONTRAST  TECHNIQUE: Contiguous axial images were obtained from the base of the skull through the vertex without intravenous contrast.  COMPARISON:  Prior MRI from 09/22/2013  FINDINGS: Cystic left occipital metastasis and appears slightly increased in size measuring the 4.0 x 2.4 cm, previously 1.8 x 3.9 cm. Mass effect on the adjacent left lateral ventricle is stable. Known posterior left temporal lobe metastasis not definitely seen. No definite new mass lesions.  No midline shift. Ventricles are within normal limits without evidence of hydrocephalus. No extra-axial fluid  collection. Chronic small vessel ischemic changes again noted.  Calvarium is intact.  No acute abnormality about the orbits.  Paranasal sinuses and mastoid air cells are clear.  IMPRESSION: 1. Slight interval increase in size of cystic left parieto-occipital mass as compared to MRI from 09/22/2013. 2. Nonvisualization of known lobe posterior temporal metastasis. 3. No other acute intracranial process.   Electronically Signed   By: Jeannine Boga M.D.   On: 11/28/2013 00:36     EKG Interpretation   Date/Time:  Monday November 28 2013 00:08:57 EDT Ventricular Rate:  77 PR Interval:  199 QRS Duration: 85 QT Interval:  350 QTC Calculation: 396 R Axis:   51 Text Interpretation:  Sinus rhythm Confirmed by [facility]  MD, Areon Cocuzza  ([id number]) on 11/28/2013 1:42:08 AM      MDM   Final diagnoses:  None    Refusing chemo and radiation.  Suspect narcotic pain medication related.  Family wants to take patient home.      Carlisle Beers, MD 11/28/13 [phone]

## 2013-12-01 ENCOUNTER — Encounter (INDEPENDENT_AMBULATORY_CARE_PROVIDER_SITE_OTHER): Payer: Self-pay

## 2013-12-01 ENCOUNTER — Ambulatory Visit
Admission: RE | Admit: 2013-12-01 | Discharge: 2013-12-01 | Disposition: A | Discharge status: Home / Self Care | Payer: Commercial Managed Care - HMO | Source: Ambulatory Visit | Attending: Radiation Oncology | Admitting: Radiation Oncology

## 2013-12-01 DIAGNOSIS — C7931 Secondary malignant neoplasm of brain: Secondary | ICD-10-CM

## 2013-12-01 DIAGNOSIS — C7949 Secondary malignant neoplasm of other parts of nervous system: Principal | ICD-10-CM

## 2013-12-01 MED ORDER — GADOBENATE DIMEGLUMINE 529 MG/ML IV SOLN
14.0000 mL | Freq: Once | INTRAVENOUS | Status: AC | PRN
  Administered 2013-12-01: 14 mL via INTRAVENOUS

## 2013-12-02 ENCOUNTER — Encounter: Payer: Self-pay | Admitting: Licensed Clinical Social Worker

## 2013-12-02 ENCOUNTER — Telehealth: Payer: Self-pay | Admitting: *Deleted

## 2013-12-02 NOTE — Telephone Encounter (Signed)
Have been unsuccessful in reaching patient or any family members. Has tried every phone # in [employer] to make contact. Will continue to try until 5 pm today. If not able to reach someone, they will not be able to admit her this weekend.

## 2013-12-02 NOTE — Progress Notes (Addendum)
I spoke by phone with Dr Gearldine Shown nurse Merceda Elks; she made the hospice referral for Dr. Benay Spice today.

## 2013-12-02 NOTE — Telephone Encounter (Signed)
Was able to reach son, [name] [name]. [name] [name] has PCS via [employer] and they have chosen to keep this service and decline Hospice. She would have to drop [place] if she went to Hospice.

## 2013-12-02 NOTE — Progress Notes (Signed)
Patient ID: [name] [name], female   DOB: 1945-07-16, 68 y.o.   MRN: [id number] CMIS message from [place], K. Rush LPN, 08/08/7679:  P4 will be care managing this patient; she has breast cancer and opted not to do treatment. Need referral for Hospice.

## 2013-12-02 NOTE — Progress Notes (Signed)
Info given to [facility] - Dr [name] nurse and doing referrals for June.

## 2013-12-02 NOTE — Telephone Encounter (Signed)
Son, [name] [name] left VM requesting Hospice referral to assist with pain management. Recent ER visit and this was suggested. Spoke with social worker, [name] who requests Dr. Benay Spice make the initial referral and then her PCP, Dr. [name] can take over after he has opportunity to see patient next week at her appointment on 12/07/13. Dr. Benay Spice agrees to [facility] referral-sent referral and demographics Attempted to call son back, no answer and voice mailbox was full.

## 2013-12-04 ENCOUNTER — Encounter: Payer: Self-pay | Admitting: Radiation Oncology

## 2013-12-04 NOTE — Progress Notes (Signed)
  Radiation Oncology         [phone]) [phone] ________________________________  Name: [name] [name] MRN: [id number]  Date: 12/05/2013  DOB: [birth year]  Multidisciplinary Neuro Oncology Clinic Follow-Up Visit Note  CC: [name], MD  [name], MD  Diagnosis:   68 yo woman with metastatic breast cancer s/p:  1. pre-op SRS to a Left parietal 4 cm brain metastasis from metastatic breast cancer on 02/03/2013 to 15 Gy followed by resection  2. 06/21/2013 stereotactic radiosurgery to the Left temporal 11 mm metastasis to 20 Gy  Interval Since Last Radiation:  6  months  Narrative:  NO SHOW, TO BE RESCHEDULED _____________________________________  [name] Tammi Klippel, M.D.

## 2013-12-05 ENCOUNTER — Ambulatory Visit
Admission: RE | Admit: 2013-12-05 | Discharge: 2013-12-05 | Disposition: A | Discharge status: Home / Self Care | Payer: Medicare HMO | Source: Ambulatory Visit | Attending: Radiation Oncology | Admitting: Radiation Oncology

## 2013-12-05 ENCOUNTER — Telehealth: Payer: Self-pay | Admitting: Radiation Oncology

## 2013-12-05 DIAGNOSIS — C7931 Secondary malignant neoplasm of brain: Secondary | ICD-10-CM

## 2013-12-05 NOTE — Telephone Encounter (Signed)
Patient did not show for appointment. Phoned patient's home. She states, "I forgot." Explained this Probation officer will request that Marcine Matar contact the patient with her rescheduled appointment date and time.

## 2013-12-07 ENCOUNTER — Encounter: Payer: Self-pay | Admitting: Internal Medicine

## 2013-12-07 ENCOUNTER — Encounter: Payer: Self-pay | Admitting: Licensed Clinical Social Worker

## 2013-12-07 ENCOUNTER — Telehealth: Payer: Self-pay | Admitting: Radiation Oncology

## 2013-12-07 ENCOUNTER — Ambulatory Visit (INDEPENDENT_AMBULATORY_CARE_PROVIDER_SITE_OTHER): Payer: Commercial Managed Care - HMO | Admitting: Internal Medicine

## 2013-12-07 ENCOUNTER — Telehealth: Payer: Self-pay | Admitting: *Deleted

## 2013-12-07 VITALS — BP 133/84 | HR 84 | Temp 97.9°F | Wt 150.2 lb

## 2013-12-07 DIAGNOSIS — C7931 Secondary malignant neoplasm of brain: Secondary | ICD-10-CM

## 2013-12-07 DIAGNOSIS — C50919 Malignant neoplasm of unspecified site of unspecified female breast: Secondary | ICD-10-CM

## 2013-12-07 DIAGNOSIS — C7951 Secondary malignant neoplasm of bone: Secondary | ICD-10-CM

## 2013-12-07 DIAGNOSIS — C7952 Secondary malignant neoplasm of bone marrow: Secondary | ICD-10-CM

## 2013-12-07 DIAGNOSIS — J45909 Unspecified asthma, uncomplicated: Secondary | ICD-10-CM

## 2013-12-07 DIAGNOSIS — C7949 Secondary malignant neoplasm of other parts of nervous system: Secondary | ICD-10-CM

## 2013-12-07 DIAGNOSIS — I10 Essential (primary) hypertension: Secondary | ICD-10-CM

## 2013-12-07 DIAGNOSIS — C782 Secondary malignant neoplasm of pleura: Secondary | ICD-10-CM

## 2013-12-07 MED ORDER — ALBUTEROL SULFATE HFA 108 (90 BASE) MCG/ACT IN AERS: 2.0000 | INHALATION_SPRAY | Freq: Four times a day (QID) | RESPIRATORY_TRACT | Status: AC | PRN

## 2013-12-07 MED ORDER — PROMETHAZINE HCL 12.5 MG PO TABS: 12.5000 mg | ORAL_TABLET | Freq: Four times a day (QID) | ORAL | Status: AC | PRN

## 2013-12-07 NOTE — Progress Notes (Signed)
Patient ID: [name] [name], female   DOB: December 19, 1945, 68 y.o.   MRN: [id number]   Subjective:   Patient ID: [name] [name] female   DOB: 07-18-1945 68 y.o.   MRN: [id number]  HPI: [name] is a 68 y.o. pleasant woman with past medical history of hypertension, hyperlipidemia, asthma, migraine headache, s/p left knee replacement, left upper DVT,  HSV meningitis, PUD due H. pylori, and metastatic breast cancer s/p left masectomy, chemotherapy, and recent palliative radiation for brain metastasis who presents with recent blacking out and fall.     She was initially diagnosed with breast cancer in 1996 and underwent left mastectomy and axillary lymph node dissection. She developed a chest wall recurrence in 2001 and completed radiation. She had treatment with Taxol/Herceptin/pertuzumab 12/29/2012 with brain metastasis detected on CT 01/19/13. She underwent SRS to a left parietal brain lesion on 02/03/2013 and left temporal brain lesion 06/21/2013. CT on 10/20/2013 revealed persistent metastatic disease involving the thoracic spine, left posterior ninth rib, and left pleura.    Pt was recently seen in ED on 11/28/13 for altered mental status where CT head w/o contrast revealed slight interval increase in size of cystic left parieto-occipital mass as compared to MRI from 09/22/2013. MRI on 12/01/13 revealed progression of the cystic left parietal lobe metastasis with increased size, mass effect, interval regression of the small left posterior temporal lobe metastasis, and no new metastatic disease identified.   Pt reports that 1 day ago she briefly blacked out (for a few seconds) without loss of consciousness followed by a fall causing her to hit her head hard on the floor. She reports having a headache with soreness and blurry vision since the fall. No seizure activity per son. She denies weakness, paraesthesias, confusion, hallucinations, or lightheadedness. She has been off-balance per son for some time  (per records since 2013) and decrease in her mental capacity and speech (slurred at times) for the past few weeks. She reports having morning nausea with occasional NBNB vomiting for the past 3 weeks. She currently is not taking anti-nausea medication but reports being on medication in the past (zofran?) which did not work. Her appetite has been good for the most part with possible weight increase. Her son requests ensure supplements to help her with increasing her calorie intake and to help with possible nausea from morphine.   Her back pain is currently well controlled with morphine 15 mg Q 4 hrs as needed for pain which per son she takes three times daily. She denies leg weakness, paresthesias, or bladder/bowel incontinence. In the ED she was prescribed kelfex for 5 days due to dirty UA but had no urinary symptoms at that time or currently.    She also has shortness of breath with wheezing for the past 2 weeks without cough or hemoptysis. She requests refill on her albuterol inhaler.   She and her son currently do not want to have hospice care because they like the care received by nursing aid. She declined systemic therapy with Herceptin and chemotherapy which was suggested by her oncologist Dr. Benay Spice at last visit end of May and continues to refuse it. She is due for follow-up on July 2nd where she will also have port flush. She was to be seen by her radiation-oncologist Dr Tammi Klippel a few days ago but missed the appointment. She and her son are agreeable to radiation therapy. Her son states he will be able to take care of her with 24-hr supervision at the  nursing facility she lives at.     Past Medical History  Diagnosis Date  . Cancer 1996    breast  . Hyperlipidemia   . Hypertension   . Asthma     best PEF=400  . 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 Chronic low back pain     MRI 2010 showed right  sided disease with paracentral  disc protusion at L5-S1 which contacts and displaces the right S1 nerve root within the lateral recess  . Migraine headache     MRI 11/18/10: Mild progression of prominent white matter type changes which may be related to a small vessel disease and / or migraine headaches.Other causes for white matter type changes such as that secondaryto; vasculitis, inflammatory process or demyelinating process feltto be secondary considerations.  . Leg swelling   . Neuralgia and neuritis   . Tenosynovitis     left, MRI 05/2003  . Herniated disc     h/o ruptured disk, laminectomy L4-L5, Dr. Eulas Post  . Breast cancer 1996, 2001    f/b Dr. Learta Codding. s/p masectomy +radation. w/ metastatic disease, now on Femara.  . Tobacco abuse   . Hx of radiation therapy 06/21/02 -07/13/02    T1-T6  . Bone metastases     breast primary, T spine  . Shortness of breath     exertion  . Hx of migraines   . Hx of radiation therapy 04/21/12    T3-T4 spinal SRS  . Breast cancer metastasized to multiple sites 07/12/2012  . Brain cancer     new 4 cm brain met   Current Outpatient Prescriptions  Medication Sig Dispense Refill  . albuterol (VENTOLIN HFA) 108 (90 BASE) MCG/ACT inhaler Inhale 2 puffs into the lungs every 6 (six) hours as needed for wheezing or shortness of breath.      . cephALEXin (KEFLEX) 500 MG capsule Take 1 capsule (500 mg total) by mouth 4 (four) times daily.  28 capsule  0  . morphine (MSIR) 15 MG tablet Take 1 tablet (15 mg total) by mouth every 4 (four) hours as needed for severe pain.  180 tablet  0  . pantoprazole (PROTONIX) 20 MG tablet Take 20 mg by mouth 2 (two) times daily.       No current facility-administered medications for this visit.   Family History  Problem Relation Age of Onset  . Anesthesia problems Neg Hx   . Hypotension Neg Hx   . Malignant hyperthermia Neg Hx   . Pseudochol deficiency Neg Hx    History   Social History  . Marital Status: Divorced      Spouse Name: N/A    Number of Children: N/A  . Years of Education: N/A   Social History Main Topics  . Smoking status: Former Smoker -- 0.50 packs/day for 50 years    Types: Cigarettes    Quit date: 01/23/2013  . Smokeless tobacco: Never Used     Comment: quit smoking 01/23/13  . Alcohol Use: No  . Drug Use: Yes    Special: Oxycodone  . Sexual Activity: Not Currently   Other Topics Concern  . None   Social History Narrative  . None   Review of Systems: Review of Systems  Constitutional: Negative for fever, chills, weight loss and malaise/fatigue.  HENT: Negative for congestion and sore throat.   Eyes: Positive  for blurred vision (since 1 day ago). Negative for double vision.  Respiratory: Positive for shortness of breath and wheezing. Negative for cough and hemoptysis.   Cardiovascular: Negative for chest pain, palpitations and leg swelling.  Gastrointestinal: Positive for nausea and vomiting. Negative for abdominal pain, diarrhea, constipation, blood in stool and melena.  Genitourinary: Negative for dysuria, urgency, frequency and hematuria.  Musculoskeletal: Positive for back pain and falls.  Neurological: Positive for speech change and headaches (since 1 day ago). Negative for dizziness, sensory change, seizures, loss of consciousness and weakness.  Psychiatric/Behavioral: Positive for depression. Negative for hallucinations.    Objective:  Physical Exam: Filed Vitals:   12/07/13 0923  BP: 133/84  Pulse: 84  Temp: 97.9 F (36.6 C)  TempSrc: Oral  Weight: 150 lb 3.2 oz (68.13 kg)  SpO2: 95%   Physical Exam  Constitutional: She is oriented to person, place, and time. She appears well-developed and well-nourished. No distress.  HENT:  Head: Normocephalic and atraumatic.  Right Ear: External ear normal.  Left Ear: External ear normal.  Nose: Nose normal.  Mouth/Throat: Oropharynx is clear and moist. No oropharyngeal exudate.  Eyes: Conjunctivae and EOM are  normal. Pupils are equal, round, and reactive to light. Right eye exhibits no discharge. Left eye exhibits no discharge. No scleral icterus.  Neck: Normal range of motion. Neck supple.  Cardiovascular: Normal rate, regular rhythm and normal heart sounds.   Pulmonary/Chest: Effort normal and breath sounds normal. No respiratory distress. She has no wheezes. She has no rales.  Abdominal: Soft. Bowel sounds are normal. She exhibits no distension. There is no tenderness. There is no rebound and no guarding.  Neurological: She is alert and oriented to person, place, and time. No cranial nerve deficit.  Normal 5/5 strength throughout. Normal sensation to light touch.  Skin: Skin is warm and dry. No rash noted. She is not diaphoretic. No erythema. No pallor.  Psychiatric:  Depressed mood    Assessment & Plan:  Please see problem list for problem-based assessment and plan

## 2013-12-07 NOTE — Progress Notes (Signed)
Patient ID: [name] [name], female   DOB: Aug 01, 1945, 68 y.o.   MRN: [id number] CMIS P4CC Care Management Note, K. Rush LPN 09/05/9371  Home visit conducted in patient's home. The 68 year old female has HTN, chronic back pain and Breast Cancer that has metastasized to multiple sites and she opted not to do treatment. Prognosis is 6-8 months per son [name] [name]. When I arrived patient was lying on the couch, crying in pain , pain medication was administered prior to me arriving at 9am. During the assessment patient fell asleep so most of the questions were answered by the son. Around 9:45 am the patient woke up and was alert and was able to answer questions readily. At 10:00 am the pain came back and patient was moaning and facial grimacing noted, I asked patient her pain level and she stated an 8 and previously it was a 10, pain medication somewhat effective. The PCA was signed and practice privacy practice was given to the son. The IA and PHQ-9 assessment completed. Medications reviewed and drug therapy problems noted. The Five wishes was discussed and patient is unsure if she has a living will. To add, a patient tool kit and medication tool kit was also given to the patient. Transportation services was set up by this Probation officer last week however the patient has not received information from [employer] in the mail, I provide patient with contact information to setup future appointments. Clarification for Hospice services were discussed; patient receives PCS from [facility] and Hospice is unable to serve patient due to an agency is already working with patient. [name], LPN from [place] came to home while this Probation officer was in the home. [name] [name] will submit a change of care form to see if the patient can receive more hours from [facility]. The son is residing in the home with the patient and provides 24 hr. care; documentation from the doctor is needed stating the patient needs 24 hour care; so he can reside in her [place]  apartment. Lastly, documentation is also needed for Medicaid transportation stating that the patient needs someone to be with her during transporting to medical appointments. I will notify [name] [name] and she will then pass the information on PCP. The main focus is pain management ( I suggest something for break-thru pain) Morphine isn't given every 4 hours, if the patient isn't in pain then it is not given maybe, PCP may need to emphasize to give and nausea every morning. Her appetite is fair; patient likes Ensure and maybe a order can be written. I will refer to [place] for the Ensure. Thanks [name] [name] -----------------------------------------------------------------

## 2013-12-07 NOTE — Assessment & Plan Note (Addendum)
Assessment: Pt with history of mild-intermittent asthma and extensive mediastinal and left pleural metastasis with large left effusion seen on CT imaging April 2015 with no recent asthma exacerbation who presents with dyspnea of 2 week duration.   Plan:  -Continue albuterol 2 puffs Q6 hr PRN acute bronchospasm   -Closely monitor dyspnea in setting of extensive metastasis with large left effusion

## 2013-12-07 NOTE — Telephone Encounter (Signed)
Received call from Dr. Naaman Plummer of [facility]. She is concerned the patient hasn't rescheduled missed follow up with Dr. Tammi Klippel since her MRI shows a mass effect and she has blacked out. She provided this Probation officer with patient's son, Balaton phone number, [phone]. Took number to Marcine Matar who immediately rescheduled patient for tomorrow and phoned the son.

## 2013-12-07 NOTE — Assessment & Plan Note (Addendum)
Assessment: Pt with metastatic breast cancer to left parietal/temporal brain, thoracic spine, left posterior right 9th rib, and left pleura with recent SRS to brain lesions August and December 2014 who presents with  persistent nausea/ vomiting, recent ED admission for AMS, and recent syncopal episode with new-onset headache and vision changes without seizure activity or focal neurological deficit most likely due to mass effect of progressive left parietal mass seen on MRI on 12/01/13.   Plan:  -Dr. Tammi Klippel and Dr. Learta Codding notified of new MRI findings  -Pt scheduled for radiation -oncology appt tomm on June 11 (agreeable to radiation therapy) -Pt scheduled for oncology appt on July 2 (not agreeable to chemotherapy at this time)  -Closely monitor dyspnea in setting of extensive mediastinal and left pleural metastasis with large left effusion, consider thoracentesis -Continue morphine 15 mg Q 4hr PRN pain (pt reported pain currently controlled) -Prescribe phenergan 12.5 mg Q 6 hr PRN (previously on zofran which reported not effective) -CM consult for ensure supplements

## 2013-12-07 NOTE — Telephone Encounter (Signed)
Call from Dr. Naaman Plummer to make Dr. Benay Spice aware of recent brain MRI results. Shows progression. Pt had a "black out" spell on 12/06/13. Rad onc has been notified about MRI. Pt agrees to treatment with radiation per Dr. Naaman Plummer. Still declining chemo. Son will be staying with pt to for her safety. Dr. Benay Spice made aware.

## 2013-12-07 NOTE — Patient Instructions (Signed)
-  Take phenergan every 6 hrs as needed for nausea  -Someone will contact you regarding ensure supplements -Will call you with follow-up appointments with your oncologist and radiation-oncologist -Pleasure meeting you!   General Instructions:   Thank you for bringing your medicines today. This helps [place] keep you safe from mistakes.   Progress Toward Treatment Goals:  Treatment Goal 11/09/2013  Blood pressure at goal    Self Care Goals & Plans:  Self Care Goal 11/09/2013  Manage my medications take my medicines as prescribed; bring my medications to every visit; refill my medications on time  Monitor my health bring my glucose meter and log to each visit  Eat healthy foods drink diet soda or water instead of juice or soda; eat more vegetables; eat foods that are low in salt; eat baked foods instead of fried foods  Be physically active take a walk every day    No flowsheet data found.   Care Management & Community Referrals:  Referral 11/09/2013  Referrals made for care management support none needed  Referrals made to community resources none

## 2013-12-07 NOTE — Assessment & Plan Note (Signed)
Assessment: Pt with well-controlled hypertension not currently on anti-hypertensive therapy who presents with blood pressure of 133/84.  Plan:  -BP 133/84 at goal <140/90  -Continue to monitor

## 2013-12-08 ENCOUNTER — Ambulatory Visit
Admission: RE | Admit: 2013-12-08 | Discharge: 2013-12-08 | Disposition: A | Discharge status: Home / Self Care | Payer: Medicare HMO | Source: Ambulatory Visit | Attending: Radiation Oncology | Admitting: Radiation Oncology

## 2013-12-08 ENCOUNTER — Encounter: Payer: Self-pay | Admitting: Radiation Oncology

## 2013-12-08 VITALS — BP 135/78 | HR 81 | Resp 16 | Wt 149.0 lb

## 2013-12-08 DIAGNOSIS — C7931 Secondary malignant neoplasm of brain: Secondary | ICD-10-CM

## 2013-12-08 DIAGNOSIS — C50919 Malignant neoplasm of unspecified site of unspecified female breast: Secondary | ICD-10-CM

## 2013-12-08 NOTE — Progress Notes (Signed)
Radiation Oncology         [phone]) [phone] ________________________________  Name: [name] [name] MRN: [id number]  Date: 12/08/2013  DOB: Jan 12, 1946  Follow-Up Visit Note  CC: [name], MD  [name], MD  Diagnosis:   68 yo woman with metastatic breast cancer s/p:  1. pre-op SRS to a Left parietal 4 cm brain metastasis from metastatic breast cancer on 02/03/2013 to 15 Gy followed by resection  2. 06/21/2013 stereotactic radiosurgery to the Left temporal 11 mm metastasis to 20 Gy   Interval Since Last Radiation:  7  months  Narrative:  The patient returns today for routine follow-up.  She returns to review her recent MRI.  She has had a fall, an episode of confusion, and a left sided headache.                         ALLERGIES:  is allergic to banana.  Meds: Current Outpatient Prescriptions  Medication Sig Dispense Refill  . albuterol (VENTOLIN HFA) 108 (90 BASE) MCG/ACT inhaler Inhale 2 puffs into the lungs every 6 (six) hours as needed for wheezing or shortness of breath.  1 Inhaler  2  . cephALEXin (KEFLEX) 500 MG capsule       . morphine (MSIR) 15 MG tablet Take 1 tablet (15 mg total) by mouth every 4 (four) hours as needed for severe pain.  180 tablet  0  . pantoprazole (PROTONIX) 20 MG tablet Take 20 mg by mouth 2 (two) times daily.      . promethazine (PHENERGAN) 12.5 MG tablet Take 1 tablet (12.5 mg total) by mouth every 6 (six) hours as needed for nausea or vomiting.  30 tablet  2   No current facility-administered medications for this encounter.    Physical Findings: The patient is in no acute distress. Patient is alert and oriented.  weight is 149 lb (67.586 kg). Her blood pressure is 135/78 and her pulse is 81. Her respiration is 16 and oxygen saturation is 96%. .She may have a visual field cut in the right lower zone in both eyes, but, she was not following directions very well during the visual field check.  No other significant changes.  Lab  Findings: Lab Results  Component Value Date   WBC 6.3 11/27/2013   HGB 12.6 11/27/2013   HCT 37.0 11/27/2013   MCV 88.5 11/27/2013   PLT 307 11/27/2013    @LASTCHEM @  Radiographic Findings: Dg Chest 2 View  11/28/2013   CLINICAL DATA:  Altered mental status.  No chest complaints.  EXAM: CHEST  2 VIEW  COMPARISON:  02/26/2013  FINDINGS: Left hemidiaphragm is elevated as it was previously. There is a small left pleural effusion, which is new. Mild left basilar opacity is noted, likely atelectasis. There is mild medial right lung base opacity that is also likely atelectasis. This is stable. Lungs are otherwise clear. No evidence of edema. No right pleural effusion. No pneumothorax.  Cardiac silhouette is mildly enlarged. Mediastinal structures are deviated to the right, unchanged. No convincing mediastinal or hilar mass. Right anterior chest wall Port-A-Cath is stable with its tip in the lower superior vena cava. There are changes from a cervical thoracic posterior fusion.  Multiple vascular clips tract along the left axilla. There are changes from the shoulder surgery.  IMPRESSION: No acute cardiopulmonary disease.  Small left pleural effusion, new from the prior study. No other change.   Electronically Signed   By: Shanon Brow  Ormond M.D.   On: 11/28/2013 00:15   Ct Head Wo Contrast  11/28/2013   CLINICAL DATA:  Altered mental status.  EXAM: CT HEAD WITHOUT CONTRAST  TECHNIQUE: Contiguous axial images were obtained from the base of the skull through the vertex without intravenous contrast.  COMPARISON:  Prior MRI from 09/22/2013  FINDINGS: Cystic left occipital metastasis and appears slightly increased in size measuring the 4.0 x 2.4 cm, previously 1.8 x 3.9 cm. Mass effect on the adjacent left lateral ventricle is stable. Known posterior left temporal lobe metastasis not definitely seen. No definite new mass lesions.  No midline shift. Ventricles are within normal limits without evidence of hydrocephalus. No  extra-axial fluid collection. Chronic small vessel ischemic changes again noted.  Calvarium is intact.  No acute abnormality about the orbits.  Paranasal sinuses and mastoid air cells are clear.  IMPRESSION: 1. Slight interval increase in size of cystic left parieto-occipital mass as compared to MRI from 09/22/2013. 2. Nonvisualization of known lobe posterior temporal metastasis. 3. No other acute intracranial process.   Electronically Signed   By: Jeannine Boga M.D.   On: 11/28/2013 00:36   Mr Jeri Cos ME Contrast  12/01/2013   CLINICAL DATA:  68 year old female with history of metastatic breast cancer. Restaging. S or S therapy. Subsequent encounter.  EXAM: MRI HEAD WITHOUT AND WITH CONTRAST  TECHNIQUE: Multiplanar, multiecho pulse sequences of the brain and surrounding structures were obtained without and with intravenous contrast.  CONTRAST:  39mL MULTIHANCE GADOBENATE DIMEGLUMINE 529 MG/ML IV SOLN  COMPARISON:  09/22/2013 and earlier.  FINDINGS: Motion artifact on today's post-contrast imaging.  Increased T2 and FLAIR signal abnormality surrounding the cystic inferior left parietal lobe mass. Mildly increased mass effect on the atrium of the left lateral ventricle (series 8, image 43). Following contrast, progressed and circumferential ring enhancement of the mass (series 11, image 81). Size of the mass also is mildly progressed, now 46 x 25 x 35 mm (AP by transverse by CC) versus previously 39 x 18 x 33 mm. The internal cystic contents appears simple, with facilitated diffusion.  At the same time the small left posterior temporal lobe enhancing mass has regressed (series 11, image 57). No edema or mass effect at that site.  No new metastasis identified.  No evidence of cortically based acute infarction identified. Stable small chronic right cerebellar lacunar infarct. Major intracranial vascular flow voids are stable. Stable patchy nonspecific white matter T2 and FLAIR hyperintensity outside of the  left parietal lobe. No acute intracranial hemorrhage identified. Negative pituitary and cervicomedullary junction. Grossly negative visualized cervical spine. Normal bone marrow signal. No ventriculomegaly. Stable orbits soft tissues. Stable paranasal sinuses and mastoids. Negative scalp soft tissues.  IMPRESSION: 1. Progression of the cystic left parietal lobe metastasis; increased size, mass effect, surrounding T2/FLAIR signal abnormality, and rim enhancement. Favor true progression of disease over treatment affect. 2. Interval regression of the small left posterior temporal lobe metastasis. 3. No new metastatic disease identified.   Electronically Signed   By: Lars Pinks M.D.   On: 12/01/2013 12:50   Impression:  The patient has enlargement of a left parietal cystic mass now measuring 4 cm.  I discussed this case with Dr. Sherwood Gambler earlier in the week after her films were presented in brain conference.  There may be options ranging from surveillance, to resection, to aspiration and possible [place] reservoir.  Plan:  I discussed the options above with the patient and her son, to the best of my  ability.  But, since they really depend on surgery, I cautioned them that they should meet with Dr. Sherwood Gambler to learn more before making any decisions.  The patient was less enthusiastic about surgery, but, the son was impressed by the size of the lesion on MRI and very interested in having his Mom see Dr. Sherwood Gambler to put strong consideration into surgery.  _____________________________________  [name] Tammi Klippel, M.D.

## 2013-12-08 NOTE — Progress Notes (Signed)
Patient has lost 17 lb in three month. She has added ensure to her diet. Presently, she is drinking one can per day but, has a shipment on the way and plans to increase number of can once these arrive. Son reports solid foods aren't staying down because of persistent nausea and vomiting. Prescribed phenergan and plans to pick this up today. Reports she fell two day ago and has had a frontal headache that radiates to her eyes ever since. Denies vision or hearing changes. Reports shortness of breath with mild exertion. Reports upper back pain at palpable nodules. Reports she takes her last Keflex today for UTI.

## 2013-12-09 NOTE — Progress Notes (Signed)
Case discussed with Dr. Rabbani at the time of the visit.  We reviewed the resident's history and exam and pertinent patient test results.  I agree with the assessment, diagnosis, and plan of care documented in the resident's note. 

## 2013-12-19 ENCOUNTER — Telehealth: Payer: Self-pay | Admitting: *Deleted

## 2013-12-19 NOTE — Telephone Encounter (Signed)
Patient's son [name] called reporting his mom is out of town, forgot her pain meds and needs a prescription faxed.  Currently near [facility]. And will be away for two days.  Denies being under Hospice.  Instructed to go to the nearest ER as a prescription for MSIR would have to be picked up with photo ID and taken to the drug store.  Asked "what can take for pain.  Will hydrocodone hurt?"  Currently on MSIR and informed him hydrocodone is not in the MSIR.  Thanked me for my help.

## 2013-12-19 NOTE — Progress Notes (Addendum)
Patient ID: [name] [name], female   DOB: 23-Jul-1945, 68 y.o.   MRN: [id number] CMIS message from [facility], Care Manager 12/16/13: I called [employer] representative Tiffany and spoke to her about program. Currently there are 500 people on the waiting list and the state allots 310 people on the program. Therefore, the patient will not be able to get on this program to receive Ensure. Our pantry provide patient with [employer] Breakfast 1 1/2 boxes and patient accepts. I will notify you if any other resources come available for the patient.

## 2013-12-29 ENCOUNTER — Ambulatory Visit: Payer: Commercial Managed Care - HMO

## 2013-12-29 ENCOUNTER — Ambulatory Visit (HOSPITAL_BASED_OUTPATIENT_CLINIC_OR_DEPARTMENT_OTHER): Payer: Medicare HMO | Admitting: Oncology

## 2013-12-29 ENCOUNTER — Telehealth: Payer: Self-pay | Admitting: Oncology

## 2013-12-29 VITALS — BP 155/82 | HR 81 | Temp 98.5°F | Resp 20 | Ht 68.0 in | Wt 146.7 lb

## 2013-12-29 DIAGNOSIS — R112 Nausea with vomiting, unspecified: Secondary | ICD-10-CM

## 2013-12-29 DIAGNOSIS — Z95828 Presence of other vascular implants and grafts: Secondary | ICD-10-CM

## 2013-12-29 DIAGNOSIS — I82409 Acute embolism and thrombosis of unspecified deep veins of unspecified lower extremity: Secondary | ICD-10-CM

## 2013-12-29 DIAGNOSIS — C44509 Unspecified malignant neoplasm of skin of other part of trunk: Secondary | ICD-10-CM

## 2013-12-29 DIAGNOSIS — M549 Dorsalgia, unspecified: Secondary | ICD-10-CM

## 2013-12-29 DIAGNOSIS — C50919 Malignant neoplasm of unspecified site of unspecified female breast: Secondary | ICD-10-CM

## 2013-12-29 DIAGNOSIS — C7949 Secondary malignant neoplasm of other parts of nervous system: Secondary | ICD-10-CM

## 2013-12-29 DIAGNOSIS — C7931 Secondary malignant neoplasm of brain: Secondary | ICD-10-CM

## 2013-12-29 DIAGNOSIS — C7952 Secondary malignant neoplasm of bone marrow: Secondary | ICD-10-CM

## 2013-12-29 DIAGNOSIS — C7951 Secondary malignant neoplasm of bone: Secondary | ICD-10-CM

## 2013-12-29 DIAGNOSIS — C44599 Other specified malignant neoplasm of skin of other part of trunk: Secondary | ICD-10-CM

## 2013-12-29 MED ORDER — HEPARIN SOD (PORK) LOCK FLUSH 100 UNIT/ML IV SOLN
500.0000 [IU] | Freq: Once | INTRAVENOUS | Status: AC
  Administered 2013-12-29: 500 [IU] via INTRAVENOUS
  Filled 2013-12-29: qty 5

## 2013-12-29 MED ORDER — SODIUM CHLORIDE 0.9 % IJ SOLN
10.0000 mL | INTRAMUSCULAR | Status: DC | PRN
  Administered 2013-12-29: 10 mL via INTRAVENOUS
  Filled 2013-12-29: qty 10

## 2013-12-29 MED ORDER — MORPHINE SULFATE 15 MG PO TABS: 15.0000 mg | ORAL_TABLET | ORAL | Status: DC | PRN

## 2013-12-29 MED ORDER — DEXAMETHASONE 4 MG PO TABS: 4.0000 mg | ORAL_TABLET | Freq: Every day | ORAL | Status: DC

## 2013-12-29 MED ORDER — PANTOPRAZOLE SODIUM 20 MG PO TBEC: 20.0000 mg | DELAYED_RELEASE_TABLET | Freq: Two times a day (BID) | ORAL | Status: AC

## 2013-12-29 NOTE — Telephone Encounter (Signed)
, °

## 2013-12-29 NOTE — Progress Notes (Signed)
[facility] OFFICE PROGRESS NOTE   Diagnosis: Breast cancer  INTERVAL HISTORY:   She returns as scheduled. She continues to have back pain. The pain is relieved with MS IR. She takes MSIR approximately twice daily. She complains of intermittent nausea and vomiting. She started Phenergan and this has helped.  The cystic left parietal lobe mass has increased in size on a brain MRI 12/01/2013. She saw Dr. Lucia Gaskins and reports he does not recommend surgery. She does not wish to undergo surgery.  She presents today with her son.  Objective:  Vital signs in last 24 hours:  Blood pressure 155/82, pulse 81, temperature 98.5 F (36.9 C), temperature source Oral, resp. rate 20, height _0  (1.727 m), weight 146 lb 11.2 oz (66.543 kg).    HEENT: Neck without mass Lymphatics: Less than 1 cm left scalene node Resp: Decreased breath sounds over the left chest, no respiratory distress Cardio: Regular rate and rhythm GI: No hepatomegaly, nontender Vascular: No leg edema Neuro: Alert and oriented, the motor she appears grossly intact in the upper and lower extremities. She ambulates without difficulty  Breast: Status post left mastectomy, 3 cm area of nodular induration over the left central anterior chest with skin thickening, slight nodularity and discoloration extending over the majority of the left anterior chest wall.  Portacath/PICC-without erythema   Lab Results:  Lab Results  Component Value Date   WBC 6.3 11/27/2013   HGB 12.6 11/27/2013   HCT 37.0 11/27/2013   MCV 88.5 11/27/2013   PLT 307 11/27/2013   NEUTROABS 5.6 11/27/2013     Imaging:  No results found.  Medications: I have reviewed the patient's current medications.  Assessment/Plan: 1.Metastatic breast cancer - initially diagnosed with breast cancer in 1996 status post left mastectomy and axillary lymph node dissection. She developed a chest wall recurrence in 2001 and completed radiation. (See previous  notes for subsequent treatment history).  2. Ulcerated left chest lesion, likely a metastatic lesion. The area of induration appears stable.  3. Severe right low back/buttock pain radiating to the left leg October 2010 with MRI on April 06, 2009 revealing a disk protrusion at L5-S1 displacing the right S1 nerve root. She was evaluated by Dr. Annette Stable. The pain has resolved.  4. Hospitalization December 1 through May 31, 2009 with nausea, vomiting and abdominal pain - resolved.  5. 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  6. Pain at the right back/ iliac region October 25, 2008.  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  8. Status post left knee replacement.  9. Chronic edema of the left lower leg.  10. "Migraines". She takes Imitrex as needed.  11. Falls and balance problems occurring over the past year. She was referred to neurology. No difficulty with ambulation today.  12. Right breast with area of firmness on exam March 2013 -negative right breast mammogram and ultrasound 09/15/2011. Negative right breast mammogram 09/16/2012.  13. Pain at upper back. Resolved following the laminectomy procedure. The pain increased. CT and MRI confirmed progressive disease. She continues morphine as needed.  14. Unsteady gait on 04/14/2012-much improved.  15. Nausea/vomiting following Faslodex 07/26/2012.  16. History of weight loss-likely secondary to progression of the metastatic breast cancer.  17. Persistent nausea and diarrhea following initiation of Taxol/Herceptin/pertuzumab 12/29/2012. 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 Admission with severe anemia and a gastric  ulcer 02/09/2013, biopsy positive for H. Pylori.  19. Left upper extremity DVT documented during the 02/09/2013 hospital admission-not treated.  20. Status post SRS to a left parietal brain lesion on 02/03/2013. Status  post SRS to a left temporal brain lesion 06/21/2014   MRI 12/01/2013 with progression of a cystic left parietal lobe metastasis, no new metastatic lesions 21. Severe pain at the upper back 10/20/2013-a CT confirmed persistent metastatic disease involving the thoracic spine, left posterior ninth rib, and left pleura.    Disposition:  [name] [name] has HER-2 positive metastatic breast cancer. She is symptomatic with pain and nausea. The nausea is most likely related to the parietal brain metastasis. Phenergan helps. We decided to begin a trial of low-dose Decadron for the nausea. She will start Decadron at a dose of 10 mg daily and contact us in approximately 10 days to let us know if this is helping. We will taper the Decadron as tolerated.  We discussed systemic treatment options again today. I explained the high chance of obtaining a partial clinical remission with systemic therapy in her case. She has not wish to consider additional systemic treatment.  [name] [name] will continue MSIR for pain. She will return for an office visit and Port-A-Cath flush in 6 weeks.  Betsy Coder, MD  12/29/2013  12:38 PM

## 2013-12-29 NOTE — Patient Instructions (Signed)

## 2013-12-29 NOTE — Progress Notes (Signed)
Reports a fall 1 month ago in the house-no syncope, thinks she may have tripped on something. No injury. Made her aware she is high fall risk due to medications, age, and weakness. Discussed home safety precautions with her and her son. She has a walker and a cane at home that she does not use at this time. Applied yellow Fall Precautions bracelet.

## 2014-01-06 ENCOUNTER — Encounter: Payer: Self-pay | Admitting: Licensed Clinical Social Worker

## 2014-01-06 ENCOUNTER — Other Ambulatory Visit: Payer: Self-pay | Admitting: Internal Medicine

## 2014-01-06 DIAGNOSIS — C7931 Secondary malignant neoplasm of brain: Secondary | ICD-10-CM

## 2014-01-06 DIAGNOSIS — R296 Repeated falls: Secondary | ICD-10-CM

## 2014-01-06 DIAGNOSIS — C50919 Malignant neoplasm of unspecified site of unspecified female breast: Secondary | ICD-10-CM

## 2014-01-06 NOTE — Progress Notes (Signed)
Patient ID: [name] [name], female   DOB: 11-16-1945, 68 y.o.   MRN: [id number] CMIS message from [place], K. Rush LPN 09/03/2900:  I did call [employer] about the bedside table however it is not covered under Medicaid. I will notify the son. The caregiver from [place] notified me that the patient is incontinent which is new and needs pull-ups. I will link to [employer]. To add, can you obtain an order for a wheelchair; patient is weak and is having frequent falls. She likes to go outside and this will help socially with her neighborhood friends. "I feel better when I'm out upon people," per patient.

## 2014-01-09 ENCOUNTER — Telehealth: Payer: Self-pay | Admitting: *Deleted

## 2014-01-09 NOTE — Telephone Encounter (Signed)
   Provider input needed: Diarrhea   Reason for call: Diarrhea-2-3 loose stools/day-also at night      ALLERGIES:  is allergic to banana.  Patient last received chemotherapy/ treatment on n/a  Patient was last seen in the office on 12/29/13   Next appt is 02/09/14   Is patient having fevers greater than 100.5?  no   Is patient having uncontrolled pain, or new pain? no   Is patient having new back pain that changes with position (worsens or eases when laying down?)  no   Is patient able to eat and drink? yes    Is patient able to pass stool without difficulty?   yes, for past 3-4 days having loose stool 2-3/day to point of incontinence and at night     Is patient having uncontrolled nausea?  no    Summary Based on the above information advised family to  Push po fluids and ensure that she is not impacted. Son denies she is impacted. No N/V or abdominal pain. Reports the aid in the home is not a tech, so she is not able to manually check for fecal impaction. Started when she started eating oatmeal and [employer] together. Suggested they limit dairy until diarrhea improves. Per Dr. Benay Spice : Take Imodium #2 now, then one qid if needed. Call office back tomorrow with status.   Tania Ade  01/09/2014, 9:27 AM   Background Info  [name] [name]   DOB: 06-17-1946   MR#: [id number]   CSN#   [id number] 01/09/2014

## 2014-01-11 ENCOUNTER — Telehealth: Payer: Self-pay | Admitting: *Deleted

## 2014-01-11 NOTE — Telephone Encounter (Signed)
Pt request supplies : DME order was done 7/10, and will be covered by [facility]  for the wheel chair. They need an order for " incontinent supplies"  - the order can be placed in EMR and will also be covered by [facility]. Caregiver from [place] states pt has become incontinent and needs pull-ups.  [facility] will print and send in if you will place an order for the Incontinent supplies.

## 2014-01-12 ENCOUNTER — Encounter: Payer: Self-pay | Admitting: Internal Medicine

## 2014-01-12 DIAGNOSIS — R32 Unspecified urinary incontinence: Secondary | ICD-10-CM | POA: Insufficient documentation

## 2014-01-12 NOTE — Telephone Encounter (Signed)
I signed paper order form for incontinence supplies; front office staff will fax to [place].

## 2014-01-17 ENCOUNTER — Encounter: Payer: Self-pay | Admitting: Licensed Clinical Social Worker

## 2014-01-17 NOTE — Progress Notes (Signed)
Patient ID: [name] [name], female   DOB: 12/04/1945, 68 y.o.   MRN: [id number] CMIS message received from K. Rush LPN, [facility] Care Manager: I received a phone call from the patient's son on yesterday and he states that the patient is having difficulty hearing; can you notify PCP for possible office for hearing assessment. To add, what is the status of the incontinent supplies. We received a donation and I was able to give the incontinent products to the patient.

## 2014-01-17 NOTE — Progress Notes (Signed)
Pt scheduled with PCP on 7/29 to evaluate hearing difficulty.

## 2014-01-23 ENCOUNTER — Telehealth: Payer: Self-pay | Admitting: *Deleted

## 2014-01-23 NOTE — Telephone Encounter (Signed)
Notified son that her script/orders have been sent and she needs to call and make appointment. Gave him phone # of [facility] [place].

## 2014-01-23 NOTE — Telephone Encounter (Signed)
VM from patient requesting referral to [facility] for refill on her mastectomy bras and prosthesis. Called [facility] [place] have not seen her in over a year. She has several appointments that were made and not kept. Was given prior script information and will have MD sign and return. Was told to have [facility] call them for appointment after they receive the script.

## 2014-01-25 ENCOUNTER — Ambulatory Visit: Payer: Medicare HMO | Admitting: Internal Medicine

## 2014-02-09 ENCOUNTER — Ambulatory Visit: Payer: Commercial Managed Care - HMO | Admitting: Nurse Practitioner

## 2014-02-12 ENCOUNTER — Encounter: Payer: Self-pay | Admitting: Radiation Oncology

## 2014-02-12 NOTE — Progress Notes (Signed)
  Radiation Oncology         [phone] ________________________________  Name: [name] [name] MRN: [id number]  Date: 02/13/2014  DOB: 30-Jun-1946  Multidisciplinary Neuro Oncology Clinic Follow-Up Visit Note  CC: [name] Filler, MD  [name] Filler, MD  Diagnosis:   68 yo woman with metastatic breast cancer s/p:  1. pre-op SRS to a Left parietal 4 cm brain metastasis from metastatic breast cancer on 02/03/2013 to 15 Gy followed by resection  2. 06/21/2013 stereotactic radiosurgery to the Left temporal 11 mm metastasis to 20 Gy   Interval Since Last Radiation:  9  months  Narrative:  The patient returns today for routine follow-up.  Patient here today for following up accompanied by her son. Patient riding in wheelchair today. Son reports his mother's balance and equilibrium have been "off" lately. Reports decline in hearing noted but, appt with Dr. [name] has already been arranged. Reports stopping decadron for three weeks but, resuming yesterday when confusion, expressive aphagia, nausea, and vomiting began. Continues to report upper back pain for which she takes morphine but, unable to score pain. Patient has lost 8 lb since June despite eating well and supplementing with Ensure                            ALLERGIES:  is allergic to banana.  Meds: Current Outpatient Prescriptions  Medication Sig Dispense Refill  . albuterol (VENTOLIN HFA) 108 (90 BASE) MCG/ACT inhaler Inhale 2 puffs into the lungs every 6 (six) hours as needed for wheezing or shortness of breath.  1 Inhaler  2  . dexamethasone (DECADRON) 4 MG tablet Take 1 tablet (4 mg total) by mouth daily.  30 tablet  1  . morphine (MSIR) 15 MG tablet Take 1 tablet (15 mg total) by mouth every 4 (four) hours as needed for severe pain.  180 tablet  0  . pantoprazole (PROTONIX) 20 MG tablet Take 1 tablet (20 mg total) by mouth 2 (two) times daily.  60 tablet  2  . promethazine (PHENERGAN) 12.5 MG tablet Take 1 tablet (12.5 mg  total) by mouth every 6 (six) hours as needed for nausea or vomiting.  30 tablet  2   No current facility-administered medications for this encounter.    Physical Findings: The patient is in no acute distress. Patient is alert and oriented.  weight is 141 lb 9.6 oz (64.229 kg). Her oral temperature is 98.3 F (36.8 C). Her blood pressure is 126/76 and her pulse is 87. Her respiration is 14 and oxygen saturation is 95%. .  No significant changes.  Impression:  The patient has some continued symptoms of her cystic brain lesion and spinal metastasis.  Plan:  Arranged palliative consult in rad-onc clinic today to review goals of care and discuss living situation.  Refilled MSIR.  After 20 minutes reviewing HER-2 status with patient and her son, she agreed to 'give systemic therapy a try.'   _____________________________________   [name]. Tammi Klippel, M.D.

## 2014-02-13 ENCOUNTER — Encounter: Payer: Self-pay | Admitting: Radiation Oncology

## 2014-02-13 ENCOUNTER — Ambulatory Visit: Admission: RE | Admit: 2014-02-13 | Payer: Medicare HMO | Source: Ambulatory Visit

## 2014-02-13 ENCOUNTER — Ambulatory Visit
Admission: RE | Admit: 2014-02-13 | Discharge: 2014-02-13 | Disposition: A | Discharge status: Home / Self Care | Payer: Medicare HMO | Source: Ambulatory Visit | Attending: Radiation Oncology | Admitting: Radiation Oncology

## 2014-02-13 ENCOUNTER — Encounter: Payer: Self-pay | Admitting: *Deleted

## 2014-02-13 VITALS — BP 126/76 | HR 87 | Temp 98.3°F | Resp 14 | Wt 141.6 lb

## 2014-02-13 DIAGNOSIS — R5381 Other malaise: Secondary | ICD-10-CM

## 2014-02-13 DIAGNOSIS — Z7189 Other specified counseling: Secondary | ICD-10-CM

## 2014-02-13 DIAGNOSIS — C7949 Secondary malignant neoplasm of other parts of nervous system: Secondary | ICD-10-CM

## 2014-02-13 DIAGNOSIS — Z66 Do not resuscitate: Secondary | ICD-10-CM

## 2014-02-13 DIAGNOSIS — R531 Weakness: Secondary | ICD-10-CM

## 2014-02-13 DIAGNOSIS — C50919 Malignant neoplasm of unspecified site of unspecified female breast: Secondary | ICD-10-CM

## 2014-02-13 DIAGNOSIS — Z515 Encounter for palliative care: Secondary | ICD-10-CM

## 2014-02-13 DIAGNOSIS — R5383 Other fatigue: Secondary | ICD-10-CM

## 2014-02-13 DIAGNOSIS — C7931 Secondary malignant neoplasm of brain: Secondary | ICD-10-CM

## 2014-02-13 MED ORDER — MORPHINE SULFATE 15 MG PO TABS: 15.0000 mg | ORAL_TABLET | ORAL | Status: DC | PRN

## 2014-02-13 NOTE — Progress Notes (Signed)
[place] Social Work  Clinical Social Work was referred by APP to review and complete healthcare advance directives.  Clinical Social Worker met with patient and patients son in [place] office.  The patient designated Fredricka Bonine, Brooke Bonito. as their primary healthcare agent and Larey Dresser as their secondary agent.  Patient also completed healthcare living will.    Clinical Social Worker notarized documents and made copies for patient/family. Clinical Social Worker will send documents to medical records to be scanned into patient's chart. Clinical Social Worker encouraged patient/family to contact with any additional questions or concerns.  Johnnye Lana, MSW, LCSW, OSW-C Clinical Social Worker [facility] [phone]

## 2014-02-13 NOTE — Consult Note (Signed)
Patient QJ:[name] [name]      DOB: 1945/07/18      EYC:[id number]     Consult Note from the Palliative Medicine Team at [facility] Requested by:     PCP: [name], MD Reason for Consultation              Phone                                                                                                                                Number:[phone]    Assessment of patients Current state:  Continued physical, functional decline 2/2 to metastatic breat cancer.  Patient has refused recommended systemic  therapy multiple times in the past.  Her oncologist is Dr Benay Spice.  She has declined hospice because she would have to give up her already established (2.5 hrs a day) in home aid through the [facility] program.  She lives with her son [name] who himself is disabled Patient and family faced with anticipatory care needs and advanced directive decsions   Consult is for introduction to the concept of Palliative Medicine;clarification of Advanced Directives,  holistic support and symptom management as indicated  This NP Wadie Lessen reviewed medical records, received report from team, assessed the patient and then meet with  [name] [name] and her son [name] [name] # [phone] in the outpatient radiation-oncology clinic.  A detailed discussion was had today regarding advanced directives.  Concepts specific to code status, artifical feeding and hydration,  was had.  The difference between a aggressive medical intervention path  and a palliative comfort care path for this patient at this time was had.  Values and goals of care important to patient and family were attempted to be elicited.  Concept of Hospice and Palliative Care were discussed.  Introduction of MOST form and completion, copy given to nursing for scanning.  Patient and family introduced to [place] LCSW for completion of HPOA  Natural trajectory and expectations of disease process were discussed.   Questions and concerns addressed.  Hard Choices booklet left for review. Family encouraged to call with questions or concerns.  PMT will continue to support holistically.    Curriculum Parameters Description of the Parameter Patient's Response  C all it what it is  Please give me a summary of what the doctors have told you about your illness. Try to call it by name, and summarize what you heard the doctor say about where are you going next. Example: I have cancer XYZ. It is located XYZ . We are going to start chemo/radiation on XYZ...  Both aware of metastatic breast cancer.  Patient has refused offered systemic treatment in the past, but today is verbalizing that she wishes to see Dr Benay Spice again to discuss treatments options.  Appointment to be set up  O ptimize and Organize List your current symptoms i.e pain, constipation, depression, anger, nausea, insomnia, vomiting diarrhea. Supportive and  palliative- care is all about optimizing your symptom management.    Start to organize your thoughts, your calendar, your important documents. Who would you want to help you with this journey? Who would you trust to make decisions for you if you could not make them yourself (your surrogate)?   Chronic back pain-controlled by Morphine 15 mg  Weakness/poor balance-PT/OT home referral made  P lanning Start working on your advanced directives, your living will and your code status with the help of the social worker and your oncologist. It is never to early to have these in order so you can enjoy each moment.   Consider working on your future goals for if your treatments are not going the way you want them to. Take charge with the help and guidance from your oncologist!     What else do you want to plan? A birthday, anniversary, a trip? Strike while the iron is hot. Set one goal each day. Discussed in detail;  introduced to [place] LCSW in cancer center and an HPOA secured and documented  MOST form  completed  E nergize What energizes and gets you up in the morning?   How is your family supporting you? How are they holding up? Would it be helpful to talk with our social worker about things related to your care and care givers?   What hobbies do you have and how can we creatively adjust them to meet your physical needs at this time? Likes to watch TV  Assisted by [facility] program, she received 2.5 hrs of care a day.      Goals of Care: 1.  Code Status:  DNR/DNI   2. Scope of Treatment:  At this time patient is open to all available and offered medical interventions to prolong quality life.  She has refused systemic Treatment recently  but at the encouragement of her son is willing to talk with oncologist again.  She will make appointment with Dr Benay Spice   3. Symptom Management:   Fatigue:-Pace yourself -Plan your day -Include naps and breaks -get a little exercise-PT/OT referral made -fuel the body-discussed healthy diet tips -consider complementary therapies   -deep breathing   -prayer/meditation   2.  Pain: Morphine 15 IR  Po every 4 hrs prn  3.  Bowel Regimen: Recommend Senna-S 1-2 tablets qhs prn   5. Psychosocial:  Emotional support offered to patient and her son.  Discussed in detail importance of continued conversation regarding her goals of care, and intention of therapy in order  to secure person centered medical care.  Discussed care giver role and importance of selfcare     Patient Documents Completed or Given: Document Given Completed  Advanced Directives Pkt yes   MOST  yes  DNR    Gone from My Sight    Hard Choices      Brief HPI: 68  y.o.  woman with past medical history of hypertension, hyperlipidemia, asthma, migraine headache, s/p left knee replacement, left upper DVT, HSV meningitis, PUD due H. pylori, and metastatic breast cancer s/p left masectomy, chemotherapy, and recent palliative radiation for brain metastasis     Initially diagnosed with breast cancer in 1996 and underwent left mastectomy and axillary lymph node dissection. She developed a chest wall recurrence in 2001 and completed radiation. She had treatment with Taxol/Herceptin/pertuzumab 12/29/2012 with brain metastasis detected on CT 01/19/13. She underwent SRS to a left parietal brain lesion on 02/03/2013 and left temporal brain lesion 06/21/2013. CT on  10/20/2013 revealed persistent metastatic disease involving the thoracic spine, left posterior ninth rib, and left pleura.  Pt was recently seen in ED on 11/28/13 for altered mental status where CT head w/o contrast revealed slight interval increase in size of cystic left parieto-occipital mass as compared to MRI from 09/22/2013. MRI on 12/01/13 revealed progression of the cystic left parietal lobe metastasis with increased size, mass effect, interval regression of the small left posterior temporal lobe metastasis, and no new metastatic disease identified.   Her back pain is currently well controlled with morphine 15 mg Q 4 hrs as needed for pain which per son she takes three times daily. She denies leg weakness, paresthesias, or bladder/bowel incontinence.   Continued physical and functional decline     ROS: weakness and "off balance" poor po intake with weight loss   PMH:  Past Medical History  Diagnosis Date  . Cancer 1996    breast  . Hyperlipidemia   . Hypertension   . Asthma     best PEF=400  . 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 Chronic low back pain     MRI 2010 showed right sided disease with paracentral  disc protusion at L5-S1 which contacts and displaces the right S1 nerve root within the lateral recess  . Migraine headache     MRI 11/18/10: Mild progression of prominent white matter type changes which may be related to a small vessel disease and / or migraine headaches.Other causes for white matter  type changes such as that secondaryto; vasculitis, inflammatory process or demyelinating process feltto be secondary considerations.  . Leg swelling   . Neuralgia and neuritis   . Tenosynovitis     left, MRI 05/2003  . Herniated disc     h/o ruptured disk, laminectomy L4-L5, Dr. Eulas Post  . Breast cancer 1996, 2001    f/b Dr. Learta Codding. s/p masectomy +radation. w/ metastatic disease, now on Femara.  . Tobacco abuse   . Hx of radiation therapy 06/21/02 -07/13/02    T1-T6  . Bone metastases     breast primary, T spine  . Shortness of breath     exertion  . Hx of migraines   . Hx of radiation therapy 04/21/12    T3-T4 spinal SRS  . Breast cancer metastasized to multiple sites 07/12/2012  . Brain cancer     new 4 cm brain met     PSH: Past Surgical History  Procedure Laterality Date  . Abdominal hysterectomy    . Cesarean section    . Mastectomy      left  . Laminectomy      L4-L5  . Joint replacement      Left Knee  . Back surgery    . Breast surgery    . Amputation  11/26/2011    Procedure: AMPUTATION DIGIT;  Surgeon: Sharmon Revere, MD;  Location: [facility];  Service: Orthopedics;  Laterality: Bilateral;  PHALANGECTOMY  - Right fifth toe and left third toe  . Rotator cuff repair      pt does not remember year of surgery  . Thoracic laminectomy  04/01/12    T 2-4-metastatic tumor resection  . Esophagogastroduodenoscopy N/A 02/11/2013    Procedure: ESOPHAGOGASTRODUODENOSCOPY (EGD);  Surgeon: Wonda Horner, MD;  Location[facility] ENDOSCOPY;  Service: Endoscopy;  Laterality: N/A;  . Colonoscopy N/A 02/11/2013  Procedure: COLONOSCOPY;  Surgeon: Wonda Horner, MD;  Location: [facility] ENDOSCOPY;  Service: Endoscopy;  Laterality: N/A;   I have reviewed the [place] and SH and  If appropriate update it with new information. Allergies  Allergen Reactions  . Banana     Abdominal pain     Scheduled Meds: Continuous Infusions: PRN Meds:.      There were no vitals taken for this visit.   PPS:40  % at best  No intake or output data in the 24 hours ending 02/13/14 1607   Physical Exam:  General: chronically ill appearing, NAd HEENT:  Moist buccal membranes, no exudate noted Ext: without edema Neuro: generalized weakness, moves al four extremities Psych: tearful, limited insight into overall prognosis and anticipatory care needs   Labs: CBC    Component Value Date/Time   WBC 6.3 11/27/2013 2318   WBC 4.5 05/24/2013 1016   RBC 3.81* 11/27/2013 2318   RBC 3.66* 05/24/2013 1016   RBC 2.38* 02/10/2013 0610   HGB 12.6 11/27/2013 2325   HGB 10.0* 05/24/2013 1016   HCT 37.0 11/27/2013 2325   HCT 31.4* 05/24/2013 1016   PLT 307 11/27/2013 2318   PLT 239 05/24/2013 1016   MCV 88.5 11/27/2013 2318   MCV 85.8 05/24/2013 1016   MCH 28.3 11/27/2013 2318   MCH 27.2 05/24/2013 1016   MCHC 32.0 11/27/2013 2318   MCHC 31.7 05/24/2013 1016   RDW 14.3 11/27/2013 2318   RDW 16.8* 05/24/2013 1016   LYMPHSABS 0.5* 11/27/2013 2318   LYMPHSABS 1.3 05/24/2013 1016   MONOABS 0.2 11/27/2013 2318   MONOABS 0.6 05/24/2013 1016   EOSABS 0.0 11/27/2013 2318   EOSABS 0.1 05/24/2013 1016   BASOSABS 0.0 11/27/2013 2318   BASOSABS 0.0 05/24/2013 1016    BMET    Component Value Date/Time   NA 139 11/27/2013 2325   NA 139 02/23/2013 1110   K 4.0 11/27/2013 2325   K 3.6 02/23/2013 1110   CL 100 11/27/2013 2325   CL 107 10/14/2012 1047   CO2 26 10/20/2013 1027   CO2 27 02/23/2013 1110   GLUCOSE 129* 11/27/2013 2325   GLUCOSE 94 02/23/2013 1110   GLUCOSE 87 10/14/2012 1047   BUN 12 11/27/2013 2325   BUN 15.2 09/22/2013 0853   CREATININE 0.80 11/27/2013 2325   CREATININE 0.9 09/22/2013 0853   CREATININE 0.89 10/23/2010 1514   CALCIUM 9.5 10/20/2013 1027   CALCIUM 9.1 02/23/2013 1110   GFRNONAA 67* 10/20/2013 1027   GFRNONAA >60 10/23/2010 1514   GFRAA 78* 10/20/2013 1027   GFRAA >60 10/23/2010 1514    CMP     Component Value Date/Time   NA 139 11/27/2013 2325   NA 139 02/23/2013 1110   K 4.0 11/27/2013 2325    K 3.6 02/23/2013 1110   CL 100 11/27/2013 2325   CL 107 10/14/2012 1047   CO2 26 10/20/2013 1027   CO2 27 02/23/2013 1110   GLUCOSE 129* 11/27/2013 2325   GLUCOSE 94 02/23/2013 1110   GLUCOSE 87 10/14/2012 1047   BUN 12 11/27/2013 2325   BUN 15.2 09/22/2013 0853   CREATININE 0.80 11/27/2013 2325   CREATININE 0.9 09/22/2013 0853   CREATININE 0.89 10/23/2010 1514   CALCIUM 9.5 10/20/2013 1027   CALCIUM 9.1 02/23/2013 1110   PROT 7.5 10/20/2013 1027   PROT 6.8 02/23/2013 1110   ALBUMIN 3.3* 10/20/2013 1027   ALBUMIN 2.5* 02/23/2013 1110   AST 13 10/20/2013 1027   AST 12 02/23/2013  1110   ALT 6 10/20/2013 1027   ALT 12 02/23/2013 1110   ALKPHOS 85 10/20/2013 1027   ALKPHOS 65 02/23/2013 1110   BILITOT 0.4 10/20/2013 1027   BILITOT 0.34 02/23/2013 1110   GFRNONAA 67* 10/20/2013 1027   GFRNONAA >60 10/23/2010 1514   GFRAA 78* 10/20/2013 1027   GFRAA >60 10/23/2010 1514   ECOG PERFORMANCE STATUS* (Eastern Cooperative Oncology Group)  0 Fully active, able to continue with all pre-disease activities without restriction. Pt score  1 Restricted in physically strenuous activity but ambulatory and able to carry out work of a light or sedentary nature, e.g., light house work, office work.   2 Ambulatory and capable of all self-care but unable to carry out any work activities. Up and about more than 50% of waking hours.    3 Capable of only limited self-care. Confined to bed or chair more than 50% of waking hours. 3  4 Completely disabled. Cannot carry on any self-care. Totally confined to bed or chair.   5 Dead.    As published in Am. J. Clin. Oncol.: Eustace Pen, M.M., Colon Flattery., [place], D.C., [place], Sharen Hint., Drexel Iha, P.P.: Toxicity And Response Criteria Of [facility]. [place] 1:071-252, 1982.  The ECOG Performance Status is in the public domain therefore available for public use. To duplicate the scale, please cite the reference above and credit the [facility] Group, Tyler Pita M.D., Group Chair   Time In Time Out Total Time Spent with Patient Total Overall Time  1100 1200 60 min 60 min    Greater than 50%  of this time was spent counseling and coordinating care related to the above assessment and plan.   Wadie Lessen NP  Palliative Medicine Team Team Phone # [phone] Pager [phone]  Discussed with Dr Ammie Dalton  And Dr Tammi Klippel

## 2014-02-13 NOTE — Progress Notes (Signed)
Patient here today for following up with Dr. Tammi Klippel accompanied by her son. Patient riding in wheelchair today. Son reports his mother's balance and equilibrium have been "off" lately. Reports decline in hearing noted but, appt with Dr. [name] has already been arranged. Reports stopping decadron for three weeks but, resuming yesterday when confusion, expressive aphagia, nausea, and vomiting began. Continues to report upper back pain for which is take morphine but, unable to score pain. Patient has lost 8 lb since June despite eating well and supplementing with Ensure.

## 2014-02-14 ENCOUNTER — Other Ambulatory Visit: Payer: Self-pay | Admitting: Radiation Oncology

## 2014-02-14 ENCOUNTER — Telehealth: Payer: Self-pay | Admitting: Radiation Oncology

## 2014-02-14 DIAGNOSIS — C50919 Malignant neoplasm of unspecified site of unspecified female breast: Secondary | ICD-10-CM

## 2014-02-14 NOTE — Addendum Note (Signed)
Encounter addended by: Heywood Footman, RN on: 02/14/2014 11:07 AM<BR>     Documentation filed: Notes Section, Flowsheet VN

## 2014-02-14 NOTE — Telephone Encounter (Signed)
Faxed completed and signed MEDICAID TRANSPORTATION VERIFICATION OF RECEIPT OF MEDICAID COVERED SERVICE form with medicaid ID number to [phone]. Confirmation fax of delivery obtained.

## 2014-02-14 NOTE — Progress Notes (Signed)
Copy of MOST form given to Verdie Drown to have scanned into the patient's chart.

## 2014-02-15 ENCOUNTER — Encounter: Payer: Self-pay | Admitting: Internal Medicine

## 2014-02-15 ENCOUNTER — Ambulatory Visit: Payer: Medicare HMO | Admitting: Internal Medicine

## 2014-02-16 ENCOUNTER — Other Ambulatory Visit: Payer: Self-pay | Admitting: *Deleted

## 2014-02-16 ENCOUNTER — Telehealth: Payer: Self-pay | Admitting: Nurse Practitioner

## 2014-02-16 ENCOUNTER — Telehealth: Payer: Self-pay | Admitting: *Deleted

## 2014-02-16 DIAGNOSIS — Z66 Do not resuscitate: Secondary | ICD-10-CM | POA: Insufficient documentation

## 2014-02-16 DIAGNOSIS — Z7189 Other specified counseling: Secondary | ICD-10-CM | POA: Insufficient documentation

## 2014-02-16 DIAGNOSIS — Z515 Encounter for palliative care: Secondary | ICD-10-CM | POA: Insufficient documentation

## 2014-02-16 DIAGNOSIS — R531 Weakness: Secondary | ICD-10-CM | POA: Insufficient documentation

## 2014-02-16 NOTE — Telephone Encounter (Signed)
Attempted to call patient X2; left voice message to call office re: f/u appt date/time and update on status.

## 2014-02-16 NOTE — Telephone Encounter (Signed)
, °

## 2014-02-21 ENCOUNTER — Encounter: Payer: Self-pay | Admitting: Internal Medicine

## 2014-02-21 ENCOUNTER — Ambulatory Visit (INDEPENDENT_AMBULATORY_CARE_PROVIDER_SITE_OTHER): Payer: Commercial Managed Care - HMO | Admitting: Internal Medicine

## 2014-02-21 VITALS — BP 137/81 | HR 75 | Temp 97.9°F | Ht 67.0 in | Wt 142.8 lb

## 2014-02-21 DIAGNOSIS — C800 Disseminated malignant neoplasm, unspecified: Secondary | ICD-10-CM

## 2014-02-21 DIAGNOSIS — C50919 Malignant neoplasm of unspecified site of unspecified female breast: Secondary | ICD-10-CM

## 2014-02-21 NOTE — Progress Notes (Signed)
Internal Medicine Clinic Attending Date of visit: 02/21/2014  I saw and evaluated the patient, and discussed her care with resident Dr. Naaman Plummer; see her note for details of clinical findings and plans.  As documented in the recent radiation oncology clinic visit, she has decided to consider systemic therapy and has an appointment with medical oncology on 02/27/2014.  Patient and her son confirmed that based upon their discussion with palliative care consultant, she had decided on DO NOT RESUSCITATE CODE STATUS and that she has the yellow form for outpatient DNR status.  She has so far declined hospice care, but she and her son are aware of what hospice has to offer and indicated today that she would consider hospice care if her status declines further.  Patient reports decreased hearing which seems to localize to the left ear; her ear canals are not obstructed by cerumen, and I suspect that the hearing loss is due to her brain metastases.  Her functional status has continued to decline, and she requested a hospital bed   She has chronic back pain which is caused by metastatic cancer.  A hospital bed will alleviate pain by allowing her back to be positioned in ways not feasible with a normal bed.  Pain episodes frequently require frequent and sometimes immediate changes in position which cannot be achieved with a normal bed.

## 2014-02-21 NOTE — Patient Instructions (Addendum)
-  Will order a hospital bed for you -Please attend your appt on 8/31 at 2:45 PM with Dr. Benay Spice -Nice seeing you again!   General Instructions:   Please bring your medicines with you each time you come to clinic.  Medicines may include prescription medications, over-the-counter medications, herbal remedies, eye drops, vitamins, or other pills.   Progress Toward Treatment Goals:  Treatment Goal 11/09/2013  Blood pressure at goal    Self Care Goals & Plans:  Self Care Goal 11/09/2013  Manage my medications take my medicines as prescribed; bring my medications to every visit; refill my medications on time  Monitor my health bring my glucose meter and log to each visit  Eat healthy foods drink diet soda or water instead of juice or soda; eat more vegetables; eat foods that are low in salt; eat baked foods instead of fried foods  Be physically active take a walk every day    No flowsheet data found.   Care Management & Community Referrals:  Referral 11/09/2013  Referrals made for care management support none needed  Referrals made to community resources none

## 2014-02-21 NOTE — Progress Notes (Signed)
Patient ID: [name] [name], female   DOB: [birth year], 68 y.o.   MRN: [id number]    Subjective:   Patient ID: [name] [name] female   DOB: Jul 13, 1945 68 y.o.   MRN: [id number]  HPI: [name] [name] is a 68 y.o. pleasant woman with past medical history of metastatic breast cancer, hypertension, hyperlipidemia, asthma, migraine headache, s/p left knee replacement, left upper DVT, HSV meningitis, and PUD due to H. Pylori who presents for follow-up visit.   She was recently seen by radiation oncology and agreed to systemic therapy. She has an appointment with her oncologist later this month (8/31). She was also seen recently by palliative care and her code status is DNR. She and her soon continue to decline Hospice care. Per her son she is doing fairly well. She continues to have back pain that is worse at night and requires frequent repositioning while in bed. She is mostly in the bed all day but can walk and sometimes needs to use a wheelchair. She continues to feel weak with balance problems but has had no recent falls.  She reports numbness and coldness in her feet. She continues to take morphine 15 mg six times a day for pain. She has been having bilateral hearing loss per son without ear pain or drainage. She denies headache, vision change, confusion, dizziness, seizures, LOC, or syncope. Her nausea is well-controlled on phenergan. She has decreased appetite with 8 lb weight loss and persistent fatigue. She denies abdominal pain or change in BM. She has physical therapy every other day.      Past Medical History  Diagnosis Date  . Cancer 1996    breast  . Hyperlipidemia   . Hypertension   . Asthma     best PEF=400  . 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 Chronic low back pain     MRI 2010 showed right sided disease with paracentral  disc protusion at L5-S1 which contacts and displaces the right S1  nerve root within the lateral recess  . Migraine headache     MRI 11/18/10: Mild progression of prominent white matter type changes which may be related to a small vessel disease and / or migraine headaches.Other causes for white matter type changes such as that secondaryto; vasculitis, inflammatory process or demyelinating process feltto be secondary considerations.  . Leg swelling   . Neuralgia and neuritis   . Tenosynovitis     left, MRI 05/2003  . Herniated disc     h/o ruptured disk, laminectomy L4-L5, Dr. Eulas Post  . Breast cancer 1996, 2001    f/b Dr. Learta Codding. s/p masectomy +radation. w/ metastatic disease, now on Femara.  . Tobacco abuse   . Hx of radiation therapy 06/21/02 -07/13/02    T1-T6  . Bone metastases     breast primary, T spine  . Shortness of breath     exertion  . Hx of migraines   . Hx of radiation therapy 04/21/12    T3-T4 spinal SRS  . Breast cancer metastasized to multiple sites 07/12/2012  . Brain cancer     new 4 cm brain met   Current Outpatient Prescriptions  Medication Sig Dispense Refill  . albuterol (VENTOLIN HFA) 108 (90 BASE) MCG/ACT inhaler Inhale 2 puffs into the lungs every 6 (six) hours as needed for wheezing or shortness of breath.  1  Inhaler  2  . dexamethasone (DECADRON) 4 MG tablet Take 1 tablet (4 mg total) by mouth daily.  30 tablet  1  . morphine (MSIR) 15 MG tablet Take 1 tablet (15 mg total) by mouth every 4 (four) hours as needed for severe pain.  180 tablet  0  . pantoprazole (PROTONIX) 20 MG tablet Take 1 tablet (20 mg total) by mouth 2 (two) times daily.  60 tablet  2  . promethazine (PHENERGAN) 12.5 MG tablet Take 1 tablet (12.5 mg total) by mouth every 6 (six) hours as needed for nausea or vomiting.  30 tablet  2   No current facility-administered medications for this visit.   Family History  Problem Relation Age of Onset  . Anesthesia problems Neg Hx   . Hypotension Neg Hx   . Malignant hyperthermia Neg Hx   . Pseudochol  deficiency Neg Hx    History   Social History  . Marital Status: Divorced    Spouse Name: N/A    Number of Children: N/A  . Years of Education: N/A   Social History Main Topics  . Smoking status: Former Smoker -- 0.50 packs/day for 50 years    Types: Cigarettes    Quit date: 01/23/2013  . Smokeless tobacco: Never Used     Comment: quit smoking 01/23/13  . Alcohol Use: No  . Drug Use: Yes    Special: Oxycodone  . Sexual Activity: Not Currently   Other Topics Concern  . None   Social History Narrative  . None   Review of Systems: Review of Systems  Constitutional: Positive for weight loss and malaise/fatigue. Negative for fever and chills.  HENT: Positive for hearing loss. Negative for ear discharge and ear pain.   Eyes: Negative for blurred vision and double vision.  Respiratory: Negative for cough and shortness of breath.   Cardiovascular: Negative for chest pain and leg swelling.  Gastrointestinal: Positive for nausea (controlled on zofran). Negative for vomiting, abdominal pain, diarrhea and constipation.  Genitourinary: Negative for dysuria, urgency and frequency.  Neurological: Positive for sensory change (numbness in feet) and weakness. Negative for dizziness, focal weakness, seizures, loss of consciousness and headaches.  Psychiatric/Behavioral: Positive for depression.    Objective:  Physical Exam: Filed Vitals:   02/21/14 1010  BP: 137/81  Pulse: 75  Temp: 97.9 F (36.6 C)  TempSrc: Oral  Height: _0  (1.702 m)  Weight: 142 lb 12.8 oz (64.774 kg)  SpO2: 97%   Physical Exam  Constitutional: She is oriented to person, place, and time. No distress.  Ill-appearing  HENT:  Head: Normocephalic and atraumatic.  Right Ear: External ear normal.  Left Ear: External ear normal.  Nose: Nose normal.  Mouth/Throat: Oropharynx is clear and moist. No oropharyngeal exudate.  Hearing intact bilaterally  Eyes: Conjunctivae and EOM are normal. Pupils are equal,  round, and reactive to light. Right eye exhibits no discharge. Left eye exhibits no discharge. No scleral icterus.  Neck: Normal range of motion. Neck supple.  Cardiovascular: Normal rate and regular rhythm.   Pulmonary/Chest: Effort normal and breath sounds normal. No respiratory distress. She has no wheezes. She has no rales.  Abdominal: Soft. Bowel sounds are normal. She exhibits no distension. There is no tenderness. There is no rebound and no guarding.  Musculoskeletal: Normal range of motion.  Neurological: She is alert and oriented to person, place, and time. No cranial nerve deficit.  Muscle strength 4/5 throughout. Normal sensation to light touch of extremities.  Skin: Skin is warm and dry. No rash noted. She is not diaphoretic. No erythema. No pallor.  Psychiatric:  Agitated and crying at times during examination. Depressed mood.     Assessment & Plan:    Please see problem list for problem-based assessment and plan

## 2014-02-23 NOTE — Assessment & Plan Note (Addendum)
Assessment: Pt with metastatic breast cancer to left parietal/temporal brain, thoracic spine, left posterior right 9th rib, and left pleura s/p SRS to brain lesions August and December 2014 with last MRI on 12/01/13 with mass effect of progressive left parietal mass who presents with well-controlled pain and nausea with recent bilateral hearing loss without neurological deficits (besides balance problems) or seizure activity who has agreed to start systemic therapy soon.       Plan:  -Order electric hospital bed due to chronic back pain caused by metastatic breast cancer to the thoracic spine. The hospital bed will help alleviate pain by allowing her back to to be positioned in ways not feasible with a normal bed. Pain episodes require frequent and immediate changes in body position which cannot be achieved with a normal bed.  -Pt to follow-up with oncologist Dr. Learta Codding on August 31 for initiation of systemic therapy  -Pt seen recently by Palliative care and son currently declines hospice care at this time. Her status remains DNR and they confirm they have the yellow form for outpatient DNR status.  -Continue morphine 15 mg Q 4hr PRN pain and phenergan 12.5 mg Q 6 hr PRN nausea/vomiting -Continue dexamethasone 4 mg daily for tumor mass effect   -Continue home physical therapy as tolearated

## 2014-02-27 ENCOUNTER — Telehealth: Payer: Self-pay | Admitting: Internal Medicine

## 2014-02-27 ENCOUNTER — Ambulatory Visit: Payer: Medicare HMO | Admitting: Nurse Practitioner

## 2014-02-27 ENCOUNTER — Telehealth: Payer: Self-pay | Admitting: Radiation Oncology

## 2014-02-27 ENCOUNTER — Telehealth: Payer: Self-pay | Admitting: Nurse Practitioner

## 2014-02-27 NOTE — Telephone Encounter (Signed)
Received message from [place] that this patient missed an in home PT appointment because she was at another doctors appt. Butch Penny reports it is policy to notify physician when this occurs.

## 2014-02-27 NOTE — Telephone Encounter (Signed)
Follow Up information.  The patient's Hospital Bed Order was placed with [facility] in [place] as their company takes [employer].  All Information was faxed in.  A Representative from [employer] will be contacting the patient in regards to this order being placed.

## 2014-02-27 NOTE — Telephone Encounter (Signed)
Pt's son cld to r/s due to transportation, r/s and mailed pt new sch...Marland KitchenMarland KitchenKJ

## 2014-02-27 NOTE — Telephone Encounter (Signed)
Returned call left by Butch Penny of [facility] PT. No answer. Left message requesting return call.

## 2014-02-28 NOTE — Consult Note (Signed)
I have reviewed and discussed the care of this patient in detail with the nurse practitioner including pertinent patient records, physical exam findings and data. I agree with details of this encounter.  

## 2014-03-13 ENCOUNTER — Ambulatory Visit (HOSPITAL_BASED_OUTPATIENT_CLINIC_OR_DEPARTMENT_OTHER): Payer: Commercial Managed Care - HMO | Admitting: Nurse Practitioner

## 2014-03-13 ENCOUNTER — Telehealth: Payer: Self-pay | Admitting: *Deleted

## 2014-03-13 VITALS — BP 128/60 | HR 87 | Temp 98.6°F | Resp 18 | Ht 67.0 in

## 2014-03-13 DIAGNOSIS — C779 Secondary and unspecified malignant neoplasm of lymph node, unspecified: Secondary | ICD-10-CM

## 2014-03-13 DIAGNOSIS — C7931 Secondary malignant neoplasm of brain: Secondary | ICD-10-CM

## 2014-03-13 DIAGNOSIS — C50919 Malignant neoplasm of unspecified site of unspecified female breast: Secondary | ICD-10-CM

## 2014-03-13 DIAGNOSIS — C50912 Malignant neoplasm of unspecified site of left female breast: Secondary | ICD-10-CM

## 2014-03-13 DIAGNOSIS — C7949 Secondary malignant neoplasm of other parts of nervous system: Secondary | ICD-10-CM

## 2014-03-13 NOTE — Telephone Encounter (Signed)
Patient sent to [facility] by ambulance with son by her side.

## 2014-03-13 NOTE — Progress Notes (Addendum)
[name] [name] OFFICE PROGRESS NOTE   Diagnosis:  Breast cancer  INTERVAL HISTORY:   [name] [name] returns after missing a recent followup visit. She is accompanied by her son. He states that she has "lost energy" and is no longer able to stand. She says her legs are "frozen". She is incontinent of urine. Her son is unsure of the last time she had a bowel movement. She has been confused. She is having difficulty sleeping due to back pain. She does get temporary relief of the pain with morphine which she takes about 3 times a day.   Objective:  Vital signs in last 24 hours:  Blood pressure 128/60, pulse 87, temperature 98.6 F (37 C), temperature source Oral, resp. rate 18, height 5\' 7"  (1.702 m), weight 0 lb (0 kg).   Of note, physical exam difficult due to her mental status. HEENT: No thrush. Resp: Breath sounds diminished at the left lung base. No respiratory distress. Cardio: Regular rate and rhythm. GI: Abdomen soft and nontender. Vascular: No leg edema. Neuro: She is awake, alert. Confused. Speech is unintelligible at times. Follows some commands. Upper extremity motor strength is intact. Legs are weak. Skin: 3-4 cm of nodular induration over the left central anterior chest. Port-A-Cath site without erythema.  Lab Results:  Lab Results  Component Value Date   WBC 6.3 11/27/2013   HGB 12.6 11/27/2013   HCT 37.0 11/27/2013   MCV 88.5 11/27/2013   PLT 307 11/27/2013   NEUTROABS 5.6 11/27/2013    Imaging:  No results found.  Medications: I have reviewed the patient's current medications.  Assessment/Plan: 1.Metastatic breast cancer - initially diagnosed with breast cancer in 1996 status post left mastectomy and axillary lymph node dissection. She developed a chest wall recurrence in 2001 and completed radiation. (See previous notes for subsequent treatment history).  2. Ulcerated left chest lesion, likely a metastatic lesion. The area of induration appears stable.   3. Severe right low back/buttock pain radiating to the left leg October 2010 with MRI on April 06, 2009 revealing a disk protrusion at L5-S1 displacing the right S1 nerve root. She was evaluated by Dr. Annette Stable. The pain has resolved.  4. Hospitalization December 1 through May 31, 2009 with nausea, vomiting and abdominal pain - resolved.  5. 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  6. Pain at the right back/ iliac region October 25, 2008.  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  8. Status post left knee replacement.  9. Chronic edema of the left lower leg.  10. "Migraines". She takes Imitrex as needed.  11. Falls and balance problems occurring over the past year. She was referred to neurology. No difficulty with ambulation today.  12. Right breast with area of firmness on exam March 2013 -negative right breast mammogram and ultrasound 09/15/2011. Negative right breast mammogram 09/16/2012.  13. Pain at upper back. Resolved following the laminectomy procedure. The pain increased. CT and MRI confirmed progressive disease. She continues morphine as needed.  14. Unsteady gait on 04/14/2012-much improved.  15. Nausea/vomiting following Faslodex 07/26/2012.  16. History of weight loss-likely secondary to progression of the metastatic breast cancer.  17. Persistent nausea and diarrhea following initiation of Taxol/Herceptin/pertuzumab 12/29/2012. 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 Admission with severe anemia and a gastric ulcer 02/09/2013, biopsy positive for H. Pylori.  19. Left upper extremity DVT  documented during the 02/09/2013 hospital admission-not treated.  20. Status post SRS to a left parietal brain lesion on 02/03/2013. Status post SRS to a left temporal brain lesion 06/21/2014  MRI 12/01/2013 with progression of a cystic left parietal lobe metastasis, no new  metastatic lesions 21. Severe pain at the upper back 10/20/2013-a CT confirmed persistent metastatic disease involving the thoracic spine, left posterior ninth rib, and left pleura.     Disposition: [name] [name] clinical status has changed considerably since we last saw her. She is not a candidate for systemic therapy. Her son understands the change in her condition is due to progression of the cancer most likely involving the brain and/or spine. He is having difficulty managing her care at home. We discussed [employer]. They are in agreement. NO CODE BLUE status was confirmed.  A bed is available at [facility]. We are making arrangements for ambulance transport.  Patient seen with Dr. Benay Spice. 30 minutes were spent face-to-face at today's visit with the majority of that time involved in counseling/coordination of care.  [name] ANP/GNP-BC   03/13/2014  9:34 AM   This was a shared visit with [name]. [name] [name] was interviewed and examined. I discussed the situation with her son. She has developed an altered mental status and  leg weakness. The leg weakness could be related to progressive brain metastases or a cord compression. [name] [name] has repeatedly refused systemic therapy. Her son indicated she does not wish to receive further therapy.  I recommend Hospice care. She appears to be a candidate for [facility]. I contacted [facility] and they are able to admit her today. The goal is comfort care.  I will follow her at [facility].   Julieanne Manson, M.D.

## 2014-04-04 ENCOUNTER — Ambulatory Visit: Payer: Commercial Managed Care - HMO | Admitting: Oncology

## 2014-04-04 DIAGNOSIS — C50912 Malignant neoplasm of unspecified site of left female breast: Secondary | ICD-10-CM

## 2014-04-04 DIAGNOSIS — C799 Secondary malignant neoplasm of unspecified site: Secondary | ICD-10-CM

## 2014-04-06 ENCOUNTER — Other Ambulatory Visit: Payer: Self-pay | Admitting: *Deleted

## 2014-04-06 NOTE — Progress Notes (Signed)
Received fax from [facility], pt will revoke the Hospice Benefit 04/07/14 and transfer home. Dr. Benay Spice made aware. Orders received for office visit in 2-3 weeks. Order to schedulers to contact pt.

## 2014-04-06 NOTE — Progress Notes (Signed)
See notes in [facility] chart. Patient seen/examined on 04/04/2014.

## 2014-04-07 ENCOUNTER — Other Ambulatory Visit: Payer: Self-pay | Admitting: *Deleted

## 2014-04-07 ENCOUNTER — Telehealth: Payer: Self-pay | Admitting: Oncology

## 2014-04-07 DIAGNOSIS — C7949 Secondary malignant neoplasm of other parts of nervous system: Secondary | ICD-10-CM

## 2014-04-07 DIAGNOSIS — C7931 Secondary malignant neoplasm of brain: Secondary | ICD-10-CM

## 2014-04-07 DIAGNOSIS — C50919 Malignant neoplasm of unspecified site of unspecified female breast: Secondary | ICD-10-CM

## 2014-04-07 MED ORDER — MORPHINE SULFATE 15 MG PO TABS: 15.0000 mg | ORAL_TABLET | ORAL | Status: DC | PRN

## 2014-04-07 NOTE — Telephone Encounter (Signed)
per 10/8 pof BS/LT 2-3 wks. pt already on schedule for BS 11/10. appt left as is as appt was scheduled and confirrmed w/pt son today.

## 2014-04-07 NOTE — Telephone Encounter (Signed)
pt son came in to sched pt for appt.[name] KitchenMarland KitchenMarland KitchenMarland Kitchenprinted new sched

## 2014-04-07 NOTE — Telephone Encounter (Signed)
Pt's son called to say patient "cannot stand taking the liquid morphine" wants to go back on pills. Dr Benay Spice notified, rx obtained. Left message for son that patient cannot take the liquid morphine if she plans to take pills

## 2014-04-10 ENCOUNTER — Telehealth: Payer: Self-pay | Admitting: *Deleted

## 2014-04-10 NOTE — Telephone Encounter (Signed)
Provider input needed: Constipation   Reason for call: "No bm in at least 5 days"  Gastrointestinal: positive for constipation   ALLERGIES:  is allergic to banana.  Patient last received chemotherapy/ treatment on 01-05-2013 Taxol  Patient was last seen in the office on 03-13-2014  Next appt is 05-09-2014  Is patient having fevers greater than 100.5?  no   Is patient having uncontrolled pain, or new pain? no   Is patient having new back pain that changes with position (worsens or eases when laying down?)  no   Is patient able to eat and drink? yes, eats three meals per day and drinks three cups water per day    Is patient able to pass stool without difficulty?   no, Son, [name] says she has not had a bm     Is patient having uncontrolled nausea?  no    Son [name] calls 04/10/2014 with complaint of  Gastrointestinal: positive for constipation and Son [name] reporting "Mom hasn't had a bowel movement in at least three days.  What do I need to do?"  Brought her home from Hospice on 04-07-2014.  Does not know when her last BM was and she is eating three meals a day.  Unable to tell if she is passing gas and says she wouldn't know when this nurse asked to talk with her.  Noted oral morphine on medicine list.  [name] believes she was given enemas and suppositories at [facility].  Transport planner at [employer].  Last bm was the evening of 04-06-2014.  Two senokot daily maintained good bowel status for her while there.      Summary Based on the above information advised [name]  to  Await return call as I notify Dr. Benay Spice of this constipation.  [name] can be reached at [phone].   Winston-Spruiell, [name] [name]  04/10/2014, 9:57 AM   Background Info  JANIECE [name]   DOB: 1945-11-17   MR#: [id number]   CSN#   [id number] 04/10/2014

## 2014-04-10 NOTE — Telephone Encounter (Signed)
Verbal order received and read back from [name] NP to continue the Senokot as used in [facility].  [name] [name] with this order.  This nurse instructed him to increase water to 8 cups daily, try hot soups, tea or coffee and increase activity.  [name] [name] asked how he is to get her here for the May 09, 2014 appointment.  [name] [name] has a "truck and no one to help get mom the wheelchair".  Denies handicap or wheelchair accessible [name] to secure the wheelchair.  This nurse suggested , [place] or to [employer]" transportation for elderly and disabled.

## 2014-04-12 ENCOUNTER — Telehealth: Payer: Self-pay | Admitting: *Deleted

## 2014-04-12 ENCOUNTER — Other Ambulatory Visit: Payer: Self-pay | Admitting: *Deleted

## 2014-04-12 DIAGNOSIS — K5900 Constipation, unspecified: Secondary | ICD-10-CM

## 2014-04-12 DIAGNOSIS — C50919 Malignant neoplasm of unspecified site of unspecified female breast: Secondary | ICD-10-CM

## 2014-04-12 MED ORDER — DEXAMETHASONE 4 MG PO TABS: 4.0000 mg | ORAL_TABLET | Freq: Every day | ORAL | Status: AC

## 2014-04-12 MED ORDER — SENNOSIDES-DOCUSATE SODIUM 8.6-50 MG PO TABS: 2.0000 | ORAL_TABLET | Freq: Two times a day (BID) | ORAL | Status: AC | PRN

## 2014-04-12 NOTE — Telephone Encounter (Signed)
Called Phyllis Abelson to check on his mother.  "Her bowels moved yesterday (04-11-2014).  It was normal.  Do I cut back on the senokot?  She needs a refill on the senokot if not and She needs decadron refilled."  Instructed to let her body be the guide.  If movements become diarrhea, loose and frequent he can cut back.   "I don't think they turned her well at the home because she has sores on her butt.  Both buttocks have areas that are white like when you quickly brush against something and in the middle she is split with opening in skin."  Instructed to turn every two hours and if she can tolerate being face down as part of a turn to try this also.  Emphasized with [name] that every two hours is the key as any bony prominence or body part can end up with a sore if pressure is not relieved every two hours for blood flow.  [name] says he will turn her every two hours instead of every four.

## 2014-04-14 ENCOUNTER — Telehealth: Payer: Self-pay | Admitting: *Deleted

## 2014-04-14 NOTE — Telephone Encounter (Signed)
Called to follow up on the status of the forms for [facility] that were sent to the office. Made him aware that Dr. Benay Spice was working on them today. They will be returned as soon as he completes them.

## 2014-04-17 ENCOUNTER — Encounter: Payer: Self-pay | Admitting: *Deleted

## 2014-04-17 NOTE — Progress Notes (Unsigned)
Faxed attestation of medical need to [facility] [phone], son notified.

## 2014-04-21 ENCOUNTER — Other Ambulatory Visit: Payer: Self-pay | Admitting: *Deleted

## 2014-04-21 MED ORDER — LORAZEPAM 0.5 MG PO TABS: 0.5000 mg | ORAL_TABLET | Freq: Four times a day (QID) | ORAL | Status: AC | PRN

## 2014-04-24 ENCOUNTER — Telehealth: Payer: Self-pay | Admitting: *Deleted

## 2014-04-24 DIAGNOSIS — C50919 Malignant neoplasm of unspecified site of unspecified female breast: Secondary | ICD-10-CM

## 2014-04-24 DIAGNOSIS — C7949 Secondary malignant neoplasm of other parts of nervous system: Secondary | ICD-10-CM

## 2014-04-24 DIAGNOSIS — C7931 Secondary malignant neoplasm of brain: Secondary | ICD-10-CM

## 2014-04-24 MED ORDER — MORPHINE SULFATE 15 MG PO TABS: 15.0000 mg | ORAL_TABLET | ORAL | Status: AC | PRN

## 2014-04-24 NOTE — Telephone Encounter (Signed)
Called pt's son per dr [name] to check on need for diapers, he states their supply is good for appr 1 week, he does not know where to get them nor from who to get the diapers. [facility]? [facility]? Hospice?  sending this to csw shanag.

## 2014-04-24 NOTE — Telephone Encounter (Signed)
Son will pick up script

## 2014-04-24 NOTE — Telephone Encounter (Signed)
She is in a lot of pain and he has given her her last #2 pain pills. Needs refill asap. Also wanted MD aware that she has a softball sized lump on her hip (under the skin). Has bumps over several areas of her body. Asking if this is the cancer? Made him aware that it could be, but can only assess her if he brings her in. He reports she is bedridden at this time MD approved her MSIR refill and he will pick it up today. Loistine Chance to call if he wants [place] to get her in this week. They still decline Hospice because of desire to keep her personal care assistant.

## 2014-04-25 NOTE — Telephone Encounter (Signed)
Thank you.  Patient receives adult diapers from [employer] original order on chart.  [place] office will contact agency.

## 2014-04-30 DEATH — deceased

## 2014-05-09 ENCOUNTER — Ambulatory Visit: Payer: Commercial Managed Care - HMO | Admitting: Oncology

## 2014-06-29 ENCOUNTER — Ambulatory Visit: Payer: Commercial Managed Care - HMO | Admitting: Oncology

## 2015-12-08 ENCOUNTER — Other Ambulatory Visit: Payer: Self-pay | Admitting: Nurse Practitioner

## 2015-12-14 ENCOUNTER — Other Ambulatory Visit: Payer: Self-pay | Admitting: Nurse Practitioner
